# Patient Record
Sex: Male | Born: 1935
Health system: Southern US, Community
[De-identification: ages and names within clinical notes are randomized; demographics above are authoritative.]

## PROBLEM LIST (undated history)

## (undated) DIAGNOSIS — C801 Malignant (primary) neoplasm, unspecified: Secondary | ICD-10-CM

## (undated) DIAGNOSIS — G8929 Other chronic pain: Secondary | ICD-10-CM

## (undated) DIAGNOSIS — R0602 Shortness of breath: Secondary | ICD-10-CM

## (undated) DIAGNOSIS — G709 Myoneural disorder, unspecified: Secondary | ICD-10-CM

## (undated) DIAGNOSIS — G253 Myoclonus: Secondary | ICD-10-CM

## (undated) DIAGNOSIS — I517 Cardiomegaly: Secondary | ICD-10-CM

## (undated) DIAGNOSIS — G4730 Sleep apnea, unspecified: Secondary | ICD-10-CM

## (undated) DIAGNOSIS — N189 Chronic kidney disease, unspecified: Secondary | ICD-10-CM

## (undated) DIAGNOSIS — N429 Disorder of prostate, unspecified: Secondary | ICD-10-CM

## (undated) DIAGNOSIS — G2581 Restless legs syndrome: Secondary | ICD-10-CM

## (undated) DIAGNOSIS — G4710 Hypersomnia, unspecified: Secondary | ICD-10-CM

## (undated) DIAGNOSIS — M79606 Pain in leg, unspecified: Secondary | ICD-10-CM

## (undated) DIAGNOSIS — R351 Nocturia: Secondary | ICD-10-CM

## (undated) DIAGNOSIS — R0902 Hypoxemia: Secondary | ICD-10-CM

## (undated) DIAGNOSIS — G478 Other sleep disorders: Secondary | ICD-10-CM

## (undated) DIAGNOSIS — I639 Cerebral infarction, unspecified: Secondary | ICD-10-CM

## (undated) DIAGNOSIS — R269 Unspecified abnormalities of gait and mobility: Secondary | ICD-10-CM

## (undated) DIAGNOSIS — M1990 Unspecified osteoarthritis, unspecified site: Secondary | ICD-10-CM

## (undated) DIAGNOSIS — I10 Essential (primary) hypertension: Secondary | ICD-10-CM

## (undated) HISTORY — DX: Unspecified abnormalities of gait and mobility: R26.9

## (undated) HISTORY — DX: Other chronic pain: G89.29

## (undated) HISTORY — PX: HERNIA REPAIR: SHX51

## (undated) HISTORY — DX: Myoclonus: G25.3

## (undated) HISTORY — DX: Restless legs syndrome: G25.81

## (undated) HISTORY — DX: Pain in leg, unspecified: M79.606

## (undated) HISTORY — DX: Nocturia: R35.1

## (undated) HISTORY — DX: Hypersomnia, unspecified: G47.10

## (undated) HISTORY — PX: CATARACT EXTRACTION: SUR2

## (undated) HISTORY — DX: Hypoxemia: R09.02

## (undated) HISTORY — DX: Other sleep disorders: G47.8

---

## 2001-07-01 HISTORY — PX: BACK SURGERY: SHX140

## 2003-01-06 ENCOUNTER — Encounter: Payer: Self-pay | Admitting: Neurosurgery

## 2003-01-06 ENCOUNTER — Inpatient Hospital Stay (HOSPITAL_COMMUNITY): Admission: RE | Admit: 2003-01-06 | Discharge: 2003-01-07 | Payer: Self-pay | Admitting: Neurosurgery

## 2012-03-12 ENCOUNTER — Other Ambulatory Visit: Payer: Self-pay | Admitting: Urology

## 2012-03-16 ENCOUNTER — Encounter (HOSPITAL_COMMUNITY): Payer: Self-pay | Admitting: Pharmacy Technician

## 2012-03-18 ENCOUNTER — Encounter (HOSPITAL_COMMUNITY)
Admission: RE | Admit: 2012-03-18 | Discharge: 2012-03-18 | Disposition: A | Discharge status: Home / Self Care | Payer: Medicare Other | Source: Ambulatory Visit | Attending: Urology | Admitting: Urology

## 2012-03-18 ENCOUNTER — Encounter (HOSPITAL_COMMUNITY): Payer: Self-pay

## 2012-03-18 HISTORY — DX: Unspecified osteoarthritis, unspecified site: M19.90

## 2012-03-18 HISTORY — DX: Essential (primary) hypertension: I10

## 2012-03-18 HISTORY — DX: Shortness of breath: R06.02

## 2012-03-18 HISTORY — DX: Chronic kidney disease, unspecified: N18.9

## 2012-03-18 HISTORY — DX: Myoneural disorder, unspecified: G70.9

## 2012-03-18 LAB — BASIC METABOLIC PANEL
BUN: 18 mg/dL (ref 6–23)
Calcium: 9 mg/dL (ref 8.4–10.5)
Creatinine, Ser: 0.91 mg/dL (ref 0.50–1.35)
GFR calc Af Amer: 88 mL/min — ABNORMAL LOW (ref >90)

## 2012-03-18 LAB — CBC
HCT: 38.1 % — ABNORMAL LOW (ref 39.0–52.0)
MCH: 32.9 pg (ref 26.0–34.0)
MCHC: 33.3 g/dL (ref 30.0–36.0)
MCV: 98.7 fL (ref 78.0–100.0)
Platelets: 254 10*3/uL (ref 150–400)
RDW: 12.7 % (ref 11.5–15.5)

## 2012-03-18 LAB — SURGICAL PCR SCREEN: MRSA, PCR: NEGATIVE

## 2012-03-18 NOTE — Patient Instructions (Signed)
[name]  03/18/2012   Your procedure is scheduled on: 03/24/12   Report to [facility] at  AM.  Call this number if you have problems the morning of surgery: [phone]   Remember:   Do not eat food:After Midnight.  May have clear liquids:until Midnight .  Clear liquids include soda, tea, black coffee, apple or grape juice, broth.  Take these medicines the morning of surgery with A SIP OF WATER:    Do not wear jewelry, make-up or nail polish.  Do not wear lotions, powders, or perfumes.   . Men may shave face and neck.  Do not bring valuables to the hospital.  Contacts, dentures or bridgework may not be worn into surgery.  Leave suitcase in the car. After surgery it may be brought to your room.  For patients admitted to the hospital, checkout time is 11:00 AM the day of discharge.   Patients discharged the day of surgery will not be allowed to drive home.  Name and phone number of your driver:  Special Instructions: SEE CHG INSTRUCTION SHEET    Please read over the following fact sheets that you were given: MRSA Information, coughing and deep breathing exercises, leg exercises

## 2012-03-18 NOTE — Progress Notes (Signed)
03/18/12 1524  OBSTRUCTIVE SLEEP APNEA  Have you ever been diagnosed with sleep apnea through a sleep study? No  Do you snore loudly (loud enough to be heard through closed doors)?  0  Do you often feel tired, fatigued, or sleepy during the daytime? 1  Has anyone observed you stop breathing during your sleep? 0  Do you have, or are you being treated for high blood pressure? 1  BMI more than 35 kg/m2? 0  Age over 76 years old? 1  Neck circumference greater than 40 cm/18 inches? 0  Gender: 1  Obstructive Sleep Apnea Score 4   Score 4 or greater  Updated health history

## 2012-03-20 NOTE — Progress Notes (Signed)
Dr Acey Lav aware of confirmed EKG on 03/18/12 along with EKG done 4/11 on chart.  No new orders given.

## 2012-03-23 NOTE — H&P (Signed)
History of Present Illness            F/u LUTS.   1 - urgency - referred by Dr. Vickey Huger Jul 2013. Bothersome daytime urgency and barely makes it to the bathroom. He has no frequency. He voids with a good stream. He has no dysuria or hematuria. He feels his bladder is empty. Nocturia 3-7. He doesn't void much urine with each trip. He is on tamsulosin for a couple of years. He had a sleep study and did not have sleep apnea. He has occasional LE edema. He saw Dr. Earlene Plater with virtually the same history in 2010 and was started on tamsulosin. He was given samples of Vesicare, but doesnt recall if he took it.    He as significant issues with RLS and has been on Mirapax and Requip. He is was given imipramine and took it for a month but says he did not refill. He noticed no difference in LUTS.  Interval Hx He returns to review his UDS and for cystoscopy.   Aug 2013 UDS - small capacity unstable detrusor. No leakage but voided off an unstable contraction. EMG up slightly due to straining. Obstructed flow pattern, PVR 40 ml.  Most bothersome symptoms are urgency, nocturia and weak stream.   Past Medical History Problems  1. History of  Anxiety (Symptom) 300.00 2. History of  Arthritis V13.4 3. History of  Depression 311 4. History of  Gastric Ulcer 531.90 5. History of  Hypertension 401.9 6. History of  Hypertension 401.9  Surgical History Problems  1. History of  Back Surgery 2. History of  Inguinal Hernia Repair  Current Meds 1. Benicar HCT 40-12.5 MG Oral Tablet; Therapy: 07Jul2012 to 2. Gabapentin 600 MG Oral Tablet; Therapy: 05Feb2013 to 3. Imipramine HCl 10 MG Oral Tablet; Therapy: 17Jun2013 to 4. Requip XL 4 MG Oral Tablet Extended Release 24 Hour; Therapy: 08May2013 to 5. Tamsulosin HCl 0.4 MG Oral Capsule; Therapy: 08Apr2013 to  Allergies Medication  1. No Known Drug Allergies  Family History Problems  1. Paternal history of  Congestive Heart Failure 2. Family history of   Death In The Family Mother 3. Family history of  Family Health Status - Father's Age Died from CHF 4. Family history of  Family Health Status - Mother's Age Died from 47 from heart failure 5. Family history of  Family Health Status Number Of Children 5 children 6. Maternal history of  Heart Disease V17.49 7. Paternal history of  Hypertension V17.49 8. Paternal history of  Renal Disorder  Social History Problems  1. Alcohol Use 3 per week 2. Marital History - Currently Married 3. Never A Smoker Denied  4. History of  Caffeine Use 5. History of  Former Smoker  Review of Systems Constitutional, skin, eye, otolaryngeal, hematologic/lymphatic, cardiovascular, pulmonary, endocrine, musculoskeletal, gastrointestinal, neurological and psychiatric system(s) were reviewed and pertinent findings if present are noted.    Physical Exam Constitutional: Well nourished and well developed . No acute distress.  Pulmonary: No respiratory distress and normal respiratory rhythm and effort.  Cardiovascular: Heart rate and rhythm are normal . No peripheral edema.  Skin: Normal skin turgor, no visible rash and no visible skin lesions.  Neuro/Psych:. Mood and affect are appropriate.    Results/Data Urine [Data Includes: Last 1 Day]   09Sep2013  COLOR YELLOW   APPEARANCE CLEAR   SPECIFIC GRAVITY 1.025   pH 6.0   GLUCOSE NEG mg/dL  BILIRUBIN NEG   KETONE NEG mg/dL  BLOOD NEG  PROTEIN NEG mg/dL  UROBILINOGEN 0.2 mg/dL  NITRITE NEG   LEUKOCYTE ESTERASE NEG    Procedure  Procedure: Cystoscopy   Informed Consent: Risks, benefits, and potential adverse events were discussed and informed consent was obtained from the patient.  Prep: The patient was prepped with betadine.  Procedure Note:  Urethral meatus:. No abnormalities.  Anterior urethra: No abnormalities.  Prostatic urethra: No abnormalities . The lateral prostatic lobes were enlarged.  Bladder: Visulization was clear. The ureteral  orifices were in the normal anatomic position bilaterally and had clear efflux of urine. A systematic survey of the bladder demonstrated no bladder tumors or stones. The mucosa was smooth without abnormalities. Examination of the bladder demonstrated mild trabeculation. The patient tolerated the procedure well.  Complications: None.    Assessment Assessed  1. Benign Prostatic Hypertrophy 600.00 2. Feelings Of Urinary Urgency 788.63      Unstable bladder with obs flow pattern and BPH with BOO on cysto   Plan Bladder Neck Obstruction (596.0), Benign Prostatic Hypertrophy (600.00), Feelings Of Urinary Urgency (788.63)  1. Follow-up Schedule Surgery Office  Follow-up  Requested for: 09Sep2013 Health Maintenance (V70.0)  2. UA With REFLEX  Done: 09Sep2013 10:42AM  Discussion/Summary     Using the Understanding prostate problems booklet we discussed the treatment of BPH/BOO and lower urinary tract symptoms including the nature risks and benefits of behavioral therapy, physical therapy, alpha blockers, 5 alpha reductase inhibitors, anticholinergics, office or OR based procedures. All questions answered. He wouldlike to proceed with Greenlight PVP. He really wants to be off medicine and proceed with an efficient, durable procedure. We discussed risks of bleeding, infection, retention, incontinence, strictures, fluid overload, thrombotic events, anesthetic complications, worsening/de novo urgency and frequency, and sexual dysfunction (impotence/retrograde ejaculation) among others. We talked about the 85 to 90 percent success rate with flow symptoms, but overactive bladder symptoms could settle down in 65 percent of the cases, persist in a third and worsen in 2 to 3 percent. We discussed treatment goals may not be reached and/or may require multiple procedures. We discussed the likelihood of achieving the goals of the procedure and potential problems that might occur during the procedure or  recuperation.     Signatures Electronically signed by : Jerilee Field, M.D.; Mar 09 2012 12:19PM

## 2012-03-24 ENCOUNTER — Encounter (HOSPITAL_COMMUNITY): Payer: Self-pay | Admitting: *Deleted

## 2012-03-24 ENCOUNTER — Encounter (HOSPITAL_COMMUNITY)
Admission: RE | Disposition: A | Discharge status: Home / Self Care | Payer: Self-pay | Source: Ambulatory Visit | Attending: Urology

## 2012-03-24 ENCOUNTER — Encounter (HOSPITAL_COMMUNITY): Payer: Self-pay | Admitting: Anesthesiology

## 2012-03-24 ENCOUNTER — Ambulatory Visit (HOSPITAL_COMMUNITY)
Admission: RE | Admit: 2012-03-24 | Discharge: 2012-03-25 | Disposition: A | Discharge status: Home / Self Care | Payer: Medicare Other | Source: Ambulatory Visit | Attending: Urology | Admitting: Urology

## 2012-03-24 ENCOUNTER — Ambulatory Visit (HOSPITAL_COMMUNITY): Payer: Medicare Other | Admitting: Anesthesiology

## 2012-03-24 DIAGNOSIS — I10 Essential (primary) hypertension: Secondary | ICD-10-CM | POA: Insufficient documentation

## 2012-03-24 DIAGNOSIS — R39198 Other difficulties with micturition: Secondary | ICD-10-CM | POA: Insufficient documentation

## 2012-03-24 DIAGNOSIS — N401 Enlarged prostate with lower urinary tract symptoms: Principal | ICD-10-CM | POA: Insufficient documentation

## 2012-03-24 DIAGNOSIS — N320 Bladder-neck obstruction: Secondary | ICD-10-CM | POA: Insufficient documentation

## 2012-03-24 DIAGNOSIS — N138 Other obstructive and reflux uropathy: Principal | ICD-10-CM | POA: Insufficient documentation

## 2012-03-24 DIAGNOSIS — Z79899 Other long term (current) drug therapy: Secondary | ICD-10-CM | POA: Insufficient documentation

## 2012-03-24 DIAGNOSIS — R3915 Urgency of urination: Secondary | ICD-10-CM | POA: Insufficient documentation

## 2012-03-24 DIAGNOSIS — Z01812 Encounter for preprocedural laboratory examination: Secondary | ICD-10-CM | POA: Insufficient documentation

## 2012-03-24 HISTORY — PX: TRANSURETHRAL RESECTION OF PROSTATE: SHX73

## 2012-03-24 SURGERY — TURP (TRANSURETHRAL RESECTION OF PROSTATE)
Anesthesia: General | Wound class: Clean Contaminated

## 2012-03-24 MED ORDER — ROPINIROLE HCL ER 4 MG PO TB24
4.0000 mg | ORAL_TABLET | Freq: Every day | ORAL | Status: DC
  Filled 2012-03-24: qty 1

## 2012-03-24 MED ORDER — LACTATED RINGERS IV SOLN
INTRAVENOUS | Status: DC
  Administered 2012-03-24: 1000 mL via INTRAVENOUS
  Administered 2012-03-24: 11:00:00 via INTRAVENOUS

## 2012-03-24 MED ORDER — ACETAMINOPHEN 325 MG PO TABS: 650.0000 mg | ORAL_TABLET | ORAL | Status: DC | PRN

## 2012-03-24 MED ORDER — HYDROCHLOROTHIAZIDE 12.5 MG PO CAPS
12.5000 mg | ORAL_CAPSULE | Freq: Every day | ORAL | Status: DC
  Administered 2012-03-24 – 2012-03-25 (×2): 12.5 mg via ORAL
  Filled 2012-03-24 (×2): qty 1

## 2012-03-24 MED ORDER — ZOLPIDEM TARTRATE 5 MG PO TABS
5.0000 mg | ORAL_TABLET | Freq: Every evening | ORAL | Status: DC | PRN
  Administered 2012-03-24: 5 mg via ORAL
  Filled 2012-03-24: qty 1

## 2012-03-24 MED ORDER — GLYCOPYRROLATE 0.2 MG/ML IJ SOLN
INTRAMUSCULAR | Status: DC | PRN
  Administered 2012-03-24: 0.6 mg via INTRAVENOUS

## 2012-03-24 MED ORDER — CIPROFLOXACIN HCL 500 MG PO TABS
500.0000 mg | ORAL_TABLET | Freq: Two times a day (BID) | ORAL | Status: DC
  Administered 2012-03-24 – 2012-03-25 (×2): 500 mg via ORAL
  Filled 2012-03-24 (×5): qty 1

## 2012-03-24 MED ORDER — ACETAMINOPHEN 10 MG/ML IV SOLN
INTRAVENOUS | Status: DC | PRN
  Administered 2012-03-24: 1000 mg via INTRAVENOUS

## 2012-03-24 MED ORDER — IRBESARTAN 300 MG PO TABS
300.0000 mg | ORAL_TABLET | Freq: Every day | ORAL | Status: DC
  Administered 2012-03-24 – 2012-03-25 (×2): 300 mg via ORAL
  Filled 2012-03-24 (×2): qty 1

## 2012-03-24 MED ORDER — MIDAZOLAM HCL 5 MG/5ML IJ SOLN
INTRAMUSCULAR | Status: DC | PRN
  Administered 2012-03-24 (×2): 1 mg via INTRAVENOUS

## 2012-03-24 MED ORDER — PROMETHAZINE HCL 25 MG/ML IJ SOLN: 6.2500 mg | INTRAMUSCULAR | Status: DC | PRN

## 2012-03-24 MED ORDER — CEPHALEXIN 500 MG PO CAPS: 500.0000 mg | ORAL_CAPSULE | Freq: Two times a day (BID) | ORAL | Status: DC

## 2012-03-24 MED ORDER — DOCUSATE SODIUM 100 MG PO CAPS: 100.0000 mg | ORAL_CAPSULE | Freq: Two times a day (BID) | ORAL | Status: DC

## 2012-03-24 MED ORDER — ROCURONIUM BROMIDE 100 MG/10ML IV SOLN
INTRAVENOUS | Status: DC | PRN
  Administered 2012-03-24: 10 mg via INTRAVENOUS
  Administered 2012-03-24: 40 mg via INTRAVENOUS

## 2012-03-24 MED ORDER — CEFAZOLIN SODIUM-DEXTROSE 2-3 GM-% IV SOLR
2.0000 g | INTRAVENOUS | Status: AC
  Administered 2012-03-24: 2 g via INTRAVENOUS

## 2012-03-24 MED ORDER — MENTHOL 3 MG MT LOZG
1.0000 | LOZENGE | OROMUCOSAL | Status: DC | PRN
  Filled 2012-03-24: qty 9

## 2012-03-24 MED ORDER — OXYCODONE-ACETAMINOPHEN 5-325 MG PO TABS
1.0000 | ORAL_TABLET | ORAL | Status: DC | PRN
Start: 2012-03-24 — End: 2012-03-27

## 2012-03-24 MED ORDER — BISACODYL 10 MG RE SUPP: 10.0000 mg | Freq: Every day | RECTAL | Status: DC | PRN

## 2012-03-24 MED ORDER — LIDOCAINE HCL 2 % EX GEL
CUTANEOUS | Status: AC
  Filled 2012-03-24: qty 10

## 2012-03-24 MED ORDER — BELLADONNA ALKALOIDS-OPIUM 16.2-60 MG RE SUPP: 1.0000 | Freq: Four times a day (QID) | RECTAL | Status: DC | PRN

## 2012-03-24 MED ORDER — OLMESARTAN MEDOXOMIL-HCTZ 40-12.5 MG PO TABS: 1.0000 | ORAL_TABLET | Freq: Every morning | ORAL | Status: DC

## 2012-03-24 MED ORDER — BACITRACIN-NEOMYCIN-POLYMYXIN 400-5-5000 EX OINT: 1.0000 "application " | TOPICAL_OINTMENT | Freq: Three times a day (TID) | CUTANEOUS | Status: DC | PRN

## 2012-03-24 MED ORDER — TAMSULOSIN HCL 0.4 MG PO CAPS
0.4000 mg | ORAL_CAPSULE | Freq: Every day | ORAL | Status: DC
  Administered 2012-03-25: 0.4 mg via ORAL
  Filled 2012-03-24 (×2): qty 1

## 2012-03-24 MED ORDER — PHENOL 1.4 % MT LIQD
1.0000 | OROMUCOSAL | Status: DC | PRN
  Filled 2012-03-24: qty 177

## 2012-03-24 MED ORDER — CEFAZOLIN SODIUM-DEXTROSE 2-3 GM-% IV SOLR
INTRAVENOUS | Status: AC
  Filled 2012-03-24: qty 50

## 2012-03-24 MED ORDER — OXYCODONE HCL 5 MG PO TABS
5.0000 mg | ORAL_TABLET | ORAL | Status: DC | PRN
  Administered 2012-03-24 – 2012-03-25 (×3): 5 mg via ORAL
  Filled 2012-03-24 (×3): qty 1

## 2012-03-24 MED ORDER — BELLADONNA ALKALOIDS-OPIUM 16.2-60 MG RE SUPP
RECTAL | Status: AC
Start: 2012-03-24 — End: 2012-03-24
  Filled 2012-03-24: qty 1

## 2012-03-24 MED ORDER — EPHEDRINE SULFATE 50 MG/ML IJ SOLN
INTRAMUSCULAR | Status: DC | PRN
  Administered 2012-03-24 (×3): 2.5 mg via INTRAVENOUS

## 2012-03-24 MED ORDER — ACETAMINOPHEN 10 MG/ML IV SOLN
INTRAVENOUS | Status: AC
  Filled 2012-03-24: qty 100

## 2012-03-24 MED ORDER — LIDOCAINE HCL (CARDIAC) 20 MG/ML IV SOLN
INTRAVENOUS | Status: DC | PRN
  Administered 2012-03-24: 80 mg via INTRAVENOUS

## 2012-03-24 MED ORDER — ONDANSETRON HCL 4 MG/2ML IJ SOLN: 4.0000 mg | INTRAMUSCULAR | Status: DC | PRN

## 2012-03-24 MED ORDER — HYOSCYAMINE SULFATE 0.125 MG SL SUBL
0.1250 mg | SUBLINGUAL_TABLET | SUBLINGUAL | Status: DC | PRN
  Filled 2012-03-24: qty 1

## 2012-03-24 MED ORDER — PROPOFOL 10 MG/ML IV BOLUS
INTRAVENOUS | Status: DC | PRN
  Administered 2012-03-24: 160 mg via INTRAVENOUS

## 2012-03-24 MED ORDER — HYDROMORPHONE HCL PF 1 MG/ML IJ SOLN: 0.2500 mg | INTRAMUSCULAR | Status: DC | PRN

## 2012-03-24 MED ORDER — NEOSTIGMINE METHYLSULFATE 1 MG/ML IJ SOLN
INTRAMUSCULAR | Status: DC | PRN
  Administered 2012-03-24: 4 mg via INTRAVENOUS

## 2012-03-24 MED ORDER — LACTATED RINGERS IV SOLN: INTRAVENOUS | Status: DC

## 2012-03-24 MED ORDER — FENTANYL CITRATE 0.05 MG/ML IJ SOLN
INTRAMUSCULAR | Status: DC | PRN
  Administered 2012-03-24 (×2): 50 ug via INTRAVENOUS

## 2012-03-24 MED ORDER — GABAPENTIN 600 MG PO TABS
600.0000 mg | ORAL_TABLET | Freq: Every day | ORAL | Status: DC
  Administered 2012-03-24: 600 mg via ORAL
  Filled 2012-03-24 (×2): qty 1

## 2012-03-24 MED ORDER — SODIUM CHLORIDE 0.9 % IR SOLN
Status: DC | PRN
  Administered 2012-03-24: 34000 mL

## 2012-03-24 MED ORDER — BELLADONNA ALKALOIDS-OPIUM 16.2-60 MG RE SUPP
RECTAL | Status: DC | PRN
  Administered 2012-03-24: 1 via RECTAL

## 2012-03-24 MED ORDER — ONDANSETRON HCL 4 MG/2ML IJ SOLN
INTRAMUSCULAR | Status: DC | PRN
  Administered 2012-03-24: 4 mg via INTRAVENOUS

## 2012-03-24 SURGICAL SUPPLY — 29 items
BAG URINE DRAINAGE (UROLOGICAL SUPPLIES) ×4 IMPLANT
BAG URO CATCHER STRL LF (DRAPE) ×2 IMPLANT
BLADE SURG 15 STRL LF DISP TIS (BLADE) IMPLANT
BLADE SURG 15 STRL SS (BLADE)
CATH FOLEY 3WAY 30CC 20FR (CATHETERS) ×2 IMPLANT
CATH FOLEY 3WAY 30CC 22FR (CATHETERS) IMPLANT
CATH URET 5FR 28IN OPEN ENDED (CATHETERS) IMPLANT
CLOTH BEACON ORANGE TIMEOUT ST (SAFETY) ×2 IMPLANT
CONT SPEC 4OZ CLIKSEAL STRL BL (MISCELLANEOUS) ×2 IMPLANT
DRAPE CAMERA CLOSED 9X96 (DRAPES) ×2 IMPLANT
ELECT LOOP MED HF 24F 12D CBL (CLIP) ×2 IMPLANT
ELECT REM PT RETURN 9FT ADLT (ELECTROSURGICAL)
ELECTRODE REM PT RTRN 9FT ADLT (ELECTROSURGICAL) IMPLANT
EVACUATOR MICROVAS BLADDER (UROLOGICAL SUPPLIES) IMPLANT
GLOVE BIOGEL M STRL SZ7.5 (GLOVE) ×2 IMPLANT
GOWN PREVENTION PLUS XLARGE (GOWN DISPOSABLE) ×2 IMPLANT
GOWN STRL NON-REIN LRG LVL3 (GOWN DISPOSABLE) ×2 IMPLANT
GOWN STRL REIN XL XLG (GOWN DISPOSABLE) ×2 IMPLANT
HOLDER FOLEY CATH W/STRAP (MISCELLANEOUS) IMPLANT
KIT ASPIRATION TUBING (SET/KITS/TRAYS/PACK) IMPLANT
LASER FIBER /GREENLIGHT LASER (Laser) ×2 IMPLANT
LASER GREENLIGHT RENTAL P/PROC (Laser) ×2 IMPLANT
LOOPS RESECTOSCOPE DISP (ELECTROSURGICAL) IMPLANT
MANIFOLD NEPTUNE II (INSTRUMENTS) ×2 IMPLANT
PACK CYSTO (CUSTOM PROCEDURE TRAY) ×2 IMPLANT
SUT ETHILON 3 0 PS 1 (SUTURE) IMPLANT
SYR 30ML LL (SYRINGE) IMPLANT
SYRINGE IRR TOOMEY STRL 70CC (SYRINGE) ×2 IMPLANT
TUBING CONNECTING 10 (TUBING) ×2 IMPLANT

## 2012-03-24 NOTE — Interval H&P Note (Signed)
History and Physical Interval Note:  03/24/2012 9:44 AM  Brandon Mason  has presented today for surgery, with the diagnosis of Benign Prostatic Hypertrophy with Bladder Outlet Obstruction  The various methods of treatment have been discussed with the patient and family. After consideration of risks, benefits and other options for treatment, the patient has consented to  Procedure(s) (LRB) with comments: TRANSURETHRAL RESECTION OF THE PROSTATE (TURP) (N/A) - Greenlight PVP laser of Prostate HOLMIUM LASER APPLICATION (N/A) -    as a surgical intervention .  The patient's history has been reviewed, patient examined, no change in status, stable for surgery.  I have reviewed the patient's chart and labs. We discussed again the improvement in flow symptoms we expect and that often the frequency, urgency and nocturia will improve but not in all cases. Questions were answered to the patient's satisfaction.   He elects to proceed.    [name]

## 2012-03-24 NOTE — Anesthesia Preprocedure Evaluation (Signed)
Anesthesia Evaluation  Patient identified by MRN, date of birth, ID band Patient awake    Reviewed: Allergy & Precautions, H&P , NPO status , Patient's Chart, lab work & pertinent test results  Airway Mallampati: II TM Distance: >3 FB Neck ROM: Full    Dental No notable dental hx.    Pulmonary shortness of breath,  breath sounds clear to auscultation  Pulmonary exam normal       Cardiovascular hypertension, Pt. on medications Rhythm:Regular Rate:Normal     Neuro/Psych  Neuromuscular disease negative psych ROS   GI/Hepatic negative GI ROS, Neg liver ROS,   Endo/Other  negative endocrine ROS  Renal/GU Renal disease  negative genitourinary   Musculoskeletal negative musculoskeletal ROS (+)   Abdominal   Peds negative pediatric ROS (+)  Hematology negative hematology ROS (+)   Anesthesia Other Findings   Reproductive/Obstetrics negative OB ROS                           Anesthesia Physical Anesthesia Plan  ASA: III  Anesthesia Plan: General   Post-op Pain Management:    Induction: Intravenous  Airway Management Planned: LMA  Additional Equipment:   Intra-op Plan:   Post-operative Plan: Extubation in OR  Informed Consent: I have reviewed the patients History and Physical, chart, labs and discussed the procedure including the risks, benefits and alternatives for the proposed anesthesia with the patient or authorized representative who has indicated his/her understanding and acceptance.   Dental advisory given  Plan Discussed with: CRNA  Anesthesia Plan Comments:         Anesthesia Quick Evaluation

## 2012-03-24 NOTE — Anesthesia Postprocedure Evaluation (Signed)
  Anesthesia Post-op Note  Patient: Brandon Mason  Procedure(s) Performed: Procedure(s) (LRB): TRANSURETHRAL RESECTION OF THE PROSTATE (TURP) (N/A) HOLMIUM LASER APPLICATION (N/A)  Patient Location: PACU  Anesthesia Type: General  Level of Consciousness: awake and alert   Airway and Oxygen Therapy: Patient Spontanous Breathing  Post-op Pain: mild  Post-op Assessment: Post-op Vital signs reviewed, Patient's Cardiovascular Status Stable, Respiratory Function Stable, Patent Airway and No signs of Nausea or vomiting  Post-op Vital Signs: stable  Complications: No apparent anesthesia complications

## 2012-03-24 NOTE — Op Note (Signed)
Preoperative diagnosis: 1 BPH 2 bladder outlet obstruction #3 lower urinary tract symptoms with a weak stream frequency and urgency Postoperative diagnosis: Same Procedure: Transurethral resection of prostate with bipolar instruments, Greenlight photo vaporization of lateral lobes of prostate  Surgeon: Mena Goes  Anesthesia: Gen.  Findings: Exam under anesthesia-the penis was normal without lesions. The testicles were descended without mass. On digital rectal exam the prostate was enlarged but was smooth without hard areas or nodules. Cystoscopy-the bladder was examined in its entirety with a 12 and 30 lens and noted to be free of urothelial carcinoma, stone or foreign body. The ureteral orifice these were normal. The ureteral orifice these were inspected before and following resection and noted to be normal without injury.  Description of procedure: After consent was obtained patient brought to the operating room. A timeout was performed to confirm the patient and procedure. After adequate anesthesia he is placed in lithotomy position. An exam under anesthesia was performed. He was then prepped and draped in the usual sterile fashion and a cystoscope was passed per urethra and the bladder examined. Patient had quite a large median lobe as well as large lateral lobes and elongated prostatic urethra. His prostate was possibly between 80-100 g. The laser sheath was placed with a visual obturator. The visual obturator was removed and the bridge place. The fiber was then passed through the sheath to be introduced into the bladder but would not pass through the distal and of the sheath. This possible year the sheath was traumatized. The fiber was then pushed through but would not pull back through. The tip of the laser fiber was damaged and the metal covering on the end of the laser fiber was stripped off. It was drained out of the bladder and collected and discarded. I did attempt to use resectoscope set to  then lasered the median lobe but with a 12 lens could not get precise visualization of the vaporization and laser exit point from the fiber. No photo vaporization was accomplished or attempted at this time. The backup laser sheath and set was still in sterile processing , therefore I introduced the bipolar loop to advance the case. Using the bipolar loop the median lobe was then resected starting in a line from the ureteral orifice these down through the bladder neck to the prostate capsule and down to the ureter. Having made to passes on either side of the median lobe the central part of the median lobe was then resected to connect the 2 channels. Adequate hemostasis was insured all chips were evacuated and again the ureteral orifice these were visualized and noted to be free of injury. The resectoscope was removed. The patient's prostate was large and quite friable therefore switch back to the laser is the equipment was now ready an attempt to laser the lateral lobes with better hemostasis. Starting from the bladder neck heading toward the apex I used to greenlight to vaporize the left lateral lobe followed by the right lateral lobe. The lateral lobes were quite large and there was quite a bit of apical tissue and I did not feel I could adequately visualize the stable condition. 2 vaporized with the laser. Therefore the laser was removed and the resectoscope was replaced. The lateral lobes were cleaned up and the apical tissue was resected with the loop. Again adequate hemostasis was insured in all chips were evacuated. The ureteral orifice these were inspected and noted to be normal. The scope was removed and a 20 [place] three-way Foley  was passed with catheter guide. The urine was draining light pink. Patient was then awakened taken to recovery room in stable condition.  Estimated blood loss: Minimal  Complications: None  Specimens: TURP chips  Drains: 20 French three-way Foley  Disposition: Patient  stable to PACU. He'll be admitted for overnight observation.

## 2012-03-24 NOTE — Progress Notes (Signed)
Patient ambulated in hallway >168feet. Patient tolerated well. Incentive spirometer also encouraged. Patient resting in chair at this time.  Will continue to monitor. Setzer, Don Broach

## 2012-03-24 NOTE — Transfer of Care (Signed)
Immediate Anesthesia Transfer of Care Note  Patient: Brandon Mason  Procedure(s) Performed: Procedure(s) (LRB) with comments: TRANSURETHRAL RESECTION OF THE PROSTATE (TURP) (N/A) - with gyrus ,Greenlight PVP laser of Prostate HOLMIUM LASER APPLICATION (N/A) -     Patient Location: PACU  Anesthesia Type: General  Level of Consciousness: awake, alert  and oriented  Airway & Oxygen Therapy: Patient Spontanous Breathing and Patient connected to face mask oxygen  Post-op Assessment: Report given to PACU RN and Post -op Vital signs reviewed and stable  Post vital signs: Reviewed and stable  Complications: No apparent anesthesia complications

## 2012-03-25 ENCOUNTER — Encounter (HOSPITAL_COMMUNITY): Payer: Self-pay | Admitting: Urology

## 2012-03-25 NOTE — Progress Notes (Signed)
Prescriptions sent to [facility] in [place] per Dr. Mena Goes. Setzer, Don Broach

## 2012-03-25 NOTE — Progress Notes (Signed)
Patient ID: Brandon Mason, male   DOB: [birth year], 78 y.o.   MRN: [id number]  Stable and without complaint. Foley removed about an hour ago.   Imp - s/p TURP bipolar and Greenlight PVP -  Plan - Void trial Home later +/- foley.

## 2012-03-25 NOTE — Discharge Summary (Signed)
Physician Discharge Summary  Patient ID: Jeffry Riss MRN: [id number] DOB/AGE: Sep 23, 1925 78 y.o.  Admit date: 03/24/2012 Discharge date: 03/25/2012  Admission Diagnoses: BPH, LUTS, BOO  Discharge Diagnoses:  same  Discharged Condition: good  Hospital Course: Patient was admitted for observation following transurethral resection of the prostate with combined Green light photo vaporization of the prostate.He had a very large prostate. His urine had light hematuria postop. CBI was turned off this morning and his Foley was removed 2 hours later. He has voided several times without difficulty. He has some red urine but there are no clots. He is voiding without difficulty. He was instructed to continue adequate fluid intake. He remains stable may discharge to home.  Consults: none  Significant Diagnostic Studies: none   Treatments: surgery: Transurethral resection of the prostate, green light photo vaporization prostate  Discharge Exam: Blood pressure 129/59, pulse 96, temperature 98.3 F (36.8 C), temperature source Oral, resp. rate 18, height 5\' 9"  (1.753 m), weight 74.889 kg (165 lb 1.6 oz), SpO2 96.00%. No acute distress Sitting in bed eating breakfast Abdomen soft and nontender Extremities no edema  Disposition: To home/self-care     Medication List     As of 03/25/2012 12:04 PM    TAKE these medications         CALCIUM 600 + D PO   Take 1 tablet by mouth daily.      cephALEXin 500 MG capsule   Commonly known as: KEFLEX   Take 1 capsule (500 mg total) by mouth 2 (two) times daily.      docusate sodium 100 MG capsule   Commonly known as: COLACE   Take 1 capsule (100 mg total) by mouth 2 (two) times daily.      fish oil-omega-3 fatty acids 1000 MG capsule   Take 1 g by mouth daily.      gabapentin 600 MG tablet   Commonly known as: NEURONTIN   Take 600 mg by mouth at bedtime.      multivitamin with minerals Tabs   Take 1 tablet by mouth daily.     olmesartan-hydrochlorothiazide 40-12.5 MG per tablet   Commonly known as: BENICAR HCT   Take 1 tablet by mouth every morning.      oxyCODONE-acetaminophen 5-325 MG per tablet   Commonly known as: PERCOCET/ROXICET   Take 1 tablet by mouth every 4 (four) hours as needed for pain.      rOPINIRole 4 MG 24 hr tablet   Commonly known as: REQUIP XL   Take 4 mg by mouth at bedtime.      Tamsulosin HCl 0.4 MG Caps   Commonly known as: FLOMAX   Take 0.4 mg by mouth daily after breakfast.           Follow-up Information    In 1 week to follow up.   Contact information:   [place] Kasper         Signed: [name] 03/25/2012, 12:04 PM

## 2012-03-27 ENCOUNTER — Observation Stay (HOSPITAL_COMMUNITY)
Admission: EM | Admit: 2012-03-27 | Discharge: 2012-03-28 | Disposition: A | Discharge status: Home / Self Care | Payer: Medicare Other | Attending: Internal Medicine | Admitting: Internal Medicine

## 2012-03-27 ENCOUNTER — Encounter (HOSPITAL_COMMUNITY): Payer: Self-pay | Admitting: *Deleted

## 2012-03-27 DIAGNOSIS — N139 Obstructive and reflux uropathy, unspecified: Secondary | ICD-10-CM

## 2012-03-27 DIAGNOSIS — N179 Acute kidney failure, unspecified: Principal | ICD-10-CM | POA: Diagnosis present

## 2012-03-27 DIAGNOSIS — Z79899 Other long term (current) drug therapy: Secondary | ICD-10-CM | POA: Insufficient documentation

## 2012-03-27 DIAGNOSIS — R339 Retention of urine, unspecified: Secondary | ICD-10-CM | POA: Insufficient documentation

## 2012-03-27 DIAGNOSIS — I10 Essential (primary) hypertension: Secondary | ICD-10-CM | POA: Insufficient documentation

## 2012-03-27 LAB — BASIC METABOLIC PANEL
Chloride: 99 mEq/L (ref 96–112)
GFR calc Af Amer: 26 mL/min — ABNORMAL LOW (ref >90)
Potassium: 4.1 mEq/L (ref 3.5–5.1)
Sodium: 136 mEq/L (ref 135–145)

## 2012-03-27 LAB — CBC
HCT: 35.7 % — ABNORMAL LOW (ref 39.0–52.0)
Platelets: 182 10*3/uL (ref 150–400)
RDW: 12.7 % (ref 11.5–15.5)
WBC: 10.4 10*3/uL (ref 4.0–10.5)

## 2012-03-27 LAB — URINALYSIS, MICROSCOPIC ONLY
Ketones, ur: NEGATIVE mg/dL
Nitrite: NEGATIVE
Specific Gravity, Urine: 1.007 (ref 1.005–1.030)
pH: 5.5 (ref 5.0–8.0)

## 2012-03-27 MED ORDER — ONDANSETRON HCL 4 MG/2ML IJ SOLN: 4.0000 mg | Freq: Four times a day (QID) | INTRAMUSCULAR | Status: DC | PRN

## 2012-03-27 MED ORDER — ADULT MULTIVITAMIN W/MINERALS CH
1.0000 | ORAL_TABLET | Freq: Every day | ORAL | Status: DC
  Administered 2012-03-27 – 2012-03-28 (×2): 1 via ORAL
  Filled 2012-03-27 (×2): qty 1

## 2012-03-27 MED ORDER — GABAPENTIN 600 MG PO TABS
600.0000 mg | ORAL_TABLET | Freq: Every day | ORAL | Status: DC
  Filled 2012-03-27: qty 1

## 2012-03-27 MED ORDER — POLYETHYLENE GLYCOL 3350 17 G PO PACK
17.0000 g | PACK | Freq: Every day | ORAL | Status: DC | PRN
  Administered 2012-03-27: 17 g via ORAL
  Filled 2012-03-27: qty 1

## 2012-03-27 MED ORDER — ONDANSETRON HCL 4 MG PO TABS: 4.0000 mg | ORAL_TABLET | Freq: Four times a day (QID) | ORAL | Status: DC | PRN

## 2012-03-27 MED ORDER — DOCUSATE SODIUM 100 MG PO CAPS
100.0000 mg | ORAL_CAPSULE | Freq: Two times a day (BID) | ORAL | Status: DC
  Administered 2012-03-27 – 2012-03-28 (×2): 100 mg via ORAL
  Filled 2012-03-27 (×3): qty 1

## 2012-03-27 MED ORDER — OXYCODONE-ACETAMINOPHEN 5-325 MG PO TABS: 1.0000 | ORAL_TABLET | ORAL | Status: DC | PRN

## 2012-03-27 MED ORDER — SODIUM CHLORIDE 0.9 % IV SOLN
INTRAVENOUS | Status: DC
  Administered 2012-03-27 – 2012-03-28 (×2): via INTRAVENOUS

## 2012-03-27 MED ORDER — ROPINIROLE HCL ER 4 MG PO TB24: 4.0000 mg | ORAL_TABLET | Freq: Every day | ORAL | Status: DC

## 2012-03-27 MED ORDER — ROPINIROLE HCL 1 MG PO TABS
1.0000 mg | ORAL_TABLET | Freq: Three times a day (TID) | ORAL | Status: DC
  Administered 2012-03-27 – 2012-03-28 (×3): 1 mg via ORAL
  Filled 2012-03-27 (×5): qty 1

## 2012-03-27 MED ORDER — TAMSULOSIN HCL 0.4 MG PO CAPS
0.4000 mg | ORAL_CAPSULE | Freq: Two times a day (BID) | ORAL | Status: DC
  Administered 2012-03-27 – 2012-03-28 (×2): 0.4 mg via ORAL
  Filled 2012-03-27 (×3): qty 1

## 2012-03-27 MED ORDER — GABAPENTIN 300 MG PO CAPS
600.0000 mg | ORAL_CAPSULE | Freq: Every day | ORAL | Status: DC
  Administered 2012-03-27: 600 mg via ORAL
  Filled 2012-03-27 (×2): qty 2

## 2012-03-27 MED ORDER — MAGNESIUM HYDROXIDE 400 MG/5ML PO SUSP: 5.0000 mL | Freq: Every day | ORAL | Status: DC | PRN

## 2012-03-27 NOTE — ED Provider Notes (Signed)
History     CSN: [id number]  Arrival date & time 03/27/12  1224   First MD Initiated Contact with Patient 03/27/12 1230      Chief Complaint  Patient presents with  . Urinary Retention    The history is provided by the patient.   the patient underwent a TURP 2 days ago and presented to the urology office today with the inability to void.  He had a Foley catheter placed and drained 1700 cc of urine.  They noticed that he was in acute renal failure in the office and sent her to the emergency department to be admitted.  The patient reports he no longer has abdominal pain.his Foley catheter has been placed in a minute he is trained all his urine.  He denies nausea and vomiting.  He has no flank pain.  He denies fevers or chills.  He has no dysuria.  Past Medical History  Diagnosis Date  . Hypertension   . Shortness of breath     with exertion   . Neuromuscular disorder     restless leg syndrome   . Arthritis   . Chronic kidney disease     BPH    Past Surgical History  Procedure Date  . Hernia repair   . Back surgery   . Transurethral resection of prostate 03/24/2012    Procedure: TRANSURETHRAL RESECTION OF THE PROSTATE (TURP);  Surgeon: [name], MD;  Location: [facility];  Service: Urology;  Laterality: N/A;  with gyrus ,Greenlight PVP laser of Prostate    History reviewed. No pertinent family history.  History  Substance Use Topics  . Smoking status: Never Smoker   . Smokeless tobacco: Never Used  . Alcohol Use: 1.2 oz/week    2 Cans of beer per week      Review of Systems  All other systems reviewed and are negative.    Allergies  Review of patient's allergies indicates no known allergies.  Home Medications   Current Outpatient Rx  Name Route Sig Dispense Refill  . CALCIUM 600 + D PO Oral Take 1 tablet by mouth daily.    . CEPHALEXIN 500 MG PO CAPS Oral Take 500 mg by mouth 2 (two) times daily.    Marland Kitchen DOCUSATE SODIUM 100 MG PO CAPS Oral Take 100 mg  by mouth 2 (two) times daily.    . OMEGA-3 FATTY ACIDS 1000 MG PO CAPS Oral Take 1 g by mouth daily.    Marland Kitchen GABAPENTIN 600 MG PO TABS Oral Take 600 mg by mouth at bedtime.    . ADULT MULTIVITAMIN W/MINERALS CH Oral Take 1 tablet by mouth daily.    Marland Kitchen OLMESARTAN MEDOXOMIL-HCTZ 40-12.5 MG PO TABS Oral Take 1 tablet by mouth every morning.    . OXYCODONE-ACETAMINOPHEN 5-325 MG PO TABS Oral Take 1 tablet by mouth every 4 (four) hours as needed.    Marland Kitchen ROPINIROLE HCL ER 4 MG PO TB24 Oral Take 4 mg by mouth daily at 2 PM daily at 2 PM.     . TAMSULOSIN HCL 0.4 MG PO CAPS Oral Take 0.4 mg by mouth 2 (two) times daily.       BP 124/69  Pulse 81  Temp 97.6 F (36.4 C) (Oral)  Resp 16  SpO2 93%  Physical Exam  Nursing note and vitals reviewed. Constitutional: He is oriented to person, place, and time. He appears well-developed and well-nourished.  HENT:  Head: Normocephalic and atraumatic.  Eyes: EOM are normal.  Neck:  Normal range of motion.  Cardiovascular: Normal rate, regular rhythm, normal heart sounds and intact distal pulses.   Pulmonary/Chest: Effort normal and breath sounds normal. No respiratory distress.  Abdominal: Soft. He exhibits no distension. There is no tenderness.  Genitourinary:       Foley catheter in place.  The patient drained 1700 mL  Musculoskeletal: Normal range of motion.  Neurological: He is alert and oriented to person, place, and time.  Skin: Skin is warm and dry.  Psychiatric: He has a normal mood and affect. Judgment normal.    ED Course  Procedures (including critical care time)  Labs Reviewed  URINALYSIS, MICROSCOPIC ONLY - Abnormal; Notable for the following:    APPearance CLOUDY (*)     Hgb urine dipstick LARGE (*)     Leukocytes, UA SMALL (*)     Bacteria, UA FEW (*)     All other components within normal limits  CBC - Abnormal; Notable for the following:    RBC 3.67 (*)     Hemoglobin 12.3 (*)     HCT 35.7 (*)     All other components within  normal limits  BASIC METABOLIC PANEL - Abnormal; Notable for the following:    Glucose, Bld 111 (*)     BUN 32 (*)     Creatinine, Ser 2.49 (*)     GFR calc non Af Amer 22 (*)     GFR calc Af Amer 26 (*)     All other components within normal limits  URINE CULTURE   No results found.    I reviewed available ER/hospitalization records thought the EMR   1. Urinary retention   2. Uropathy obstructive       MDM  I spoke with the urology team who recommended that the patient be admitted overnight for observation given his postobstructive uropathy in the renal failure.        Lyanne Co, MD 03/27/12 1525

## 2012-03-27 NOTE — Progress Notes (Signed)
Patient is 78 year old male sent from urology office to be admitted for further observation for acute kidney injury. Patient was in urology office for follow up status post TURP. Patient has had distended bladder and once foley put in 1,700 cc urine cleared.  Vital signs reviewed, patient is stable for med-surg admission.  Manson Passey [facility] [phone]

## 2012-03-27 NOTE — ED Notes (Signed)
ZOX:[id number]<AV> Expected date:03/27/12<BR> Expected time:11:39 AM<BR> Means of arrival:<BR> Comments:<BR> Dutch Quint from [facility]/Urinary retention.  S/P TURP

## 2012-03-27 NOTE — ED Notes (Signed)
md at bedside  Pt alert and oriented x4. Respirations even and unlabored, bilateral symmetrical rise and fall of chest. Skin warm and dry. In no acute distress. Denies needs.   

## 2012-03-27 NOTE — ED Notes (Signed)
Report given to fred, rn on floor

## 2012-03-27 NOTE — Consult Note (Signed)
  Pt s/p TURP on Tuesday. Despite passing a void trial Wednesday he did not void yesterday. One issue is constipation and he has not had a BM since Monday. He had > 1 L drained from bladder earlier and noted to have acute renal failure. He has no complaints now.  A&Ox3 NAD GU - foley draining well. Urine clear  IMP -  S/p TURP Constipation - rec MOM or miralax. Do not give per rectum treatment (enema's , etc.) ARF Acute urinary retention  Plan -  Cont. Foley. ARF should resolve. Appreciate [facility] admission. Pt should keep foley and f/u with me in 1-2 weeks for void trial.  Cont tamsulosin daily.

## 2012-03-27 NOTE — H&P (Addendum)
[facility] History and Physical  Brandon Mason YNW:[id number] DOB: 1926/02/03 DOA: 03/27/2012  Referring physician: ER physician PCP: Pamelia Hoit, MD   Chief Complaint: acute urinary retention  HPI:  Patient is a 78 year old male with recent TURP who was seen in urology office today as he did not have any urine output. When foley inserted patient has cleared around 1,700 cc of clear urine. Per urology, patient needed to be admitted for observation. Evaluation in ED revealed acute kidney injury.  Patient has had no complaints of abdominal pain, no nausea or vomiting, no chest pain, no shortness of breath, no palpitations. No dizziness or loss of consciousness. No reports of blood in stool or urine.  Principal Problem:  *Acute kidney injury in the setting of acute urinary retention - foley in place - will obtain BMP in am - may continue Flomax - fluids for next 12 hours - added miralax for constipation  Manson Passey [facility]  [phone]   Review of Systems:  Constitutional: Negative for fever, chills and malaise/fatigue. Negative for diaphoresis.  HENT: Negative for hearing loss, ear pain, nosebleeds, congestion, sore throat, neck pain, tinnitus and ear discharge.   Eyes: Negative for blurred vision, double vision, photophobia, pain, discharge and redness.  Respiratory: Negative for cough, hemoptysis, sputum production, shortness of breath, wheezing and stridor.   Cardiovascular: Negative for chest pain, palpitations, orthopnea, claudication and leg swelling.  Gastrointestinal: Negative for nausea, vomiting and abdominal pain. Negative for heartburn, constipation, blood in stool and melena.  Genitourinary: per HPI.  Musculoskeletal: Negative for myalgias, back pain, joint pain and falls.  Skin: Negative for itching and rash.  Neurological: Negative for dizziness and weakness. Negative for tingling, tremors, sensory change, speech change, focal weakness, loss of consciousness  and headaches.  Endo/Heme/Allergies: Negative for environmental allergies and polydipsia. Does not bruise/bleed easily.  Psychiatric/Behavioral: Negative for suicidal ideas. The patient is not nervous/anxious.      Past Medical History  Diagnosis Date  . Hypertension   . Shortness of breath     with exertion   . Neuromuscular disorder     restless leg syndrome   . Arthritis   . Chronic kidney disease     BPH   Past Surgical History  Procedure Date  . Hernia repair   . Back surgery   . Transurethral resection of prostate 03/24/2012    Procedure: TRANSURETHRAL RESECTION OF THE PROSTATE (TURP);  Surgeon: [name], MD;  Location: [facility];  Service: Urology;  Laterality: N/A;  with gyrus ,Greenlight PVP laser of Prostate   Social History:  reports that he has never smoked. He has never used smokeless tobacco. He reports that he drinks about 1.2 ounces of alcohol per week. He reports that he does not use illicit drugs.  No Known Allergies  Family History: heart disease in father  Prior to Admission medications   Medication Sig Start Date End Date Taking? Authorizing Provider  Calcium Carbonate-Vitamin D (CALCIUM 600 + D PO) Take 1 tablet by mouth daily.   Yes Historical Provider, MD  cephALEXin (KEFLEX) 500 MG capsule Take 500 mg by mouth 2 (two) times daily. 03/24/12  Yes [name], MD  docusate sodium (COLACE) 100 MG capsule Take 100 mg by mouth 2 (two) times daily. 03/24/12  Yes [name], MD  fish oil-omega-3 fatty acids 1000 MG capsule Take 1 g by mouth daily.   Yes Historical Provider, MD  gabapentin (NEURONTIN) 600 MG tablet Take 600 mg by  mouth at bedtime.   Yes Historical Provider, MD  Multiple Vitamin (MULTIVITAMIN WITH MINERALS) TABS Take 1 tablet by mouth daily.   Yes Historical Provider, MD  olmesartan-hydrochlorothiazide (BENICAR HCT) 40-12.5 MG per tablet Take 1 tablet by mouth every morning.   Yes Historical Provider, MD    oxyCODONE-acetaminophen (PERCOCET/ROXICET) 5-325 MG per tablet Take 1 tablet by mouth every 4 (four) hours as needed. 03/24/12  Yes [name], MD  rOPINIRole (REQUIP XL) 4 MG 24 hr tablet Take 4 mg by mouth daily at 2 PM daily at 2 PM.    Yes Historical Provider, MD  Tamsulosin HCl (FLOMAX) 0.4 MG CAPS Take 0.4 mg by mouth 2 (two) times daily.    Yes Historical Provider, MD   Physical Exam: Filed Vitals:   03/27/12 1238  BP: 124/69  Pulse: 81  Temp: 97.6 F (36.4 C)  TempSrc: Oral  Resp: 16  SpO2: 93%    Physical Exam  Constitutional: Appears well-developed and well-nourished. No distress.  HENT: Normocephalic. External right and left ear normal. Oropharynx is clear and moist.  Eyes: Conjunctivae and EOM are normal. PERRLA, no scleral icterus.  Neck: Normal ROM. Neck supple. No JVD. No tracheal deviation. No thyromegaly.  CVS: RRR, S1/S2 +, no murmurs, no gallops, no carotid bruit.  Pulmonary: Effort and breath sounds normal, no stridor, rhonchi, wheezes, rales.  Abdominal: Soft. BS +,  no distension, tenderness, rebound or guarding.  Musculoskeletal: Normal range of motion. No edema and no tenderness.  Lymphadenopathy: No lymphadenopathy noted, cervical, inguinal. Neuro: Alert. Normal reflexes, muscle tone coordination. No cranial nerve deficit. Skin: Skin is warm and dry. No rash noted. Not diaphoretic. No erythema. No pallor.  Psychiatric: Normal mood and affect. Behavior, judgment, thought content normal.   Labs on Admission:  Basic Metabolic Panel:  Lab 03/27/12 7829  NA 136  K 4.1  CL 99  CO2 29  GLUCOSE 111*  BUN 32*  CREATININE 2.49*  CALCIUM 9.0   CBC:  Lab 03/27/12 1345  WBC 10.4  HGB 12.3*  HCT 35.7*  MCV 97.3  PLT 182   Radiological Exams on Admission: No results found.  EKG: Normal sinus rhythm, no ST/T wave changes  Code Status: Full Family Communication: Pt at bedside Disposition Plan: Admit for further evaluation  Manson Passey, MD  [facility] Pager [phone]  If 7PM-7AM, please contact night-coverage [email or web address] Password [facility] 03/27/2012, 4:59 PM

## 2012-03-27 NOTE — ED Notes (Signed)
Pt alert and oriented x4. Respirations even and unlabored, bilateral symmetrical rise and fall of chest. Skin warm and dry. In no acute distress. Denies needs.   

## 2012-03-27 NOTE — ED Notes (Signed)
Pt reports that he has prostate surgery on Tues. Did not urinate wed or Thursday. Pt came to [facility] urology today. They inserted catheter and checked some blood work. Reported "his kidneys were damaged". Pt reported they drained 1000 ml urine. rn just emptied leg bag 700 ml present. Pt reports he was having abdominal pain 10/10, but took pain meds at home. Pt has not had bowel movement since Monday.

## 2012-03-28 LAB — CBC
HCT: 31.4 % — ABNORMAL LOW (ref 39.0–52.0)
Hemoglobin: 10.8 g/dL — ABNORMAL LOW (ref 13.0–17.0)
RBC: 3.22 MIL/uL — ABNORMAL LOW (ref 4.22–5.81)

## 2012-03-28 LAB — COMPREHENSIVE METABOLIC PANEL
CO2: 29 mEq/L (ref 19–32)
Calcium: 8.5 mg/dL (ref 8.4–10.5)
Creatinine, Ser: 1.32 mg/dL (ref 0.50–1.35)
GFR calc Af Amer: 56 mL/min — ABNORMAL LOW (ref >90)
GFR calc non Af Amer: 48 mL/min — ABNORMAL LOW (ref >90)
Glucose, Bld: 102 mg/dL — ABNORMAL HIGH (ref 70–99)
Total Protein: 5.6 g/dL — ABNORMAL LOW (ref 6.0–8.3)

## 2012-03-28 LAB — URINE CULTURE: Colony Count: NO GROWTH

## 2012-03-28 LAB — GLUCOSE, CAPILLARY: Glucose-Capillary: 99 mg/dL (ref 70–99)

## 2012-03-28 MED ORDER — PEG 3350-KCL-NA BICARB-NACL 420 G PO SOLR
4000.0000 mL | Freq: Once | ORAL | Status: AC
  Administered 2012-03-28: 4000 mL via ORAL
  Filled 2012-03-28: qty 4000

## 2012-03-28 NOTE — Progress Notes (Signed)
Patient ID: Brandon Mason, male   DOB: [birth year], 78 y.o.   MRN: [id number]    Subjective:  No new complaints.  Objective: Vital signs in last 24 hours: Temp:  [97.9 F (36.6 C)-98.6 F (37 C)] 98.2 F (36.8 C) (09/28 0641) Pulse Rate:  [85-100] 85  (09/28 0641) Resp:  [18-20] 20  (09/28 0641) BP: (126-144)/(67-88) 140/72 mmHg (09/28 0641) SpO2:  [93 %-94 %] 93 % (09/28 0641) Weight:  [74.844 kg (165 lb)-75.297 kg (166 lb)] 74.844 kg (165 lb) (09/28 0641)  Intake/Output from previous day: 09/27 0701 - 09/28 0700 In: -  Out: 1250 [Urine:1250] Intake/Output this shift: Total I/O In: -  Out: 725 [Urine:725]   Lab Results:  [facility] 03/28/12 0355 03/27/12 1345  HGB 10.8* 12.3*  HCT 31.4* 35.7*   BMET  Basename 03/28/12 0355 03/27/12 1345  NA 139 136  K 3.9 4.1  CL 102 99  CO2 29 29  GLUCOSE 102* 111*  BUN 26* 32*  CREATININE 1.32 2.49*  CALCIUM 8.5 9.0     Studies/Results: No results found.  Assessment/Plan: Renal function improved.  Will need followup with Dr. Mena Goes for outpatient voiding trial.   LOS: 1 day   Areona Homer,LES 03/28/2012, 2:40 PM

## 2012-03-28 NOTE — Progress Notes (Signed)
Patient received discharge instructions and verbalized understanding without further questions. Patient follow up appointments discussed. Patient belongings packed. Patient to ambulate downstairs supervised.

## 2012-03-28 NOTE — Discharge Summary (Signed)
Physician Discharge Summary  Brandon Mason ZOX:[id number] DOB: 1926/03/24 DOA: 03/27/2012  PCP: Pamelia Hoit, MD  Admit date: 03/27/2012 Discharge date: 03/28/2012  Discharge Diagnoses:  Principal Problem:  *Acute kidney injury  Discharge Condition: medically stable for discharge home today with foley; pt will follow up with urology for voiding trial.  Diet recommendation: as tolerated  History of present illness: Patient is a 78 year old male with recent TURP who was seen in urology office today as he did not have any urine output. When foley inserted patient has cleared around 1,700 cc of clear urine. Per urology, patient needed to be admitted for observation.  Evaluation in ED revealed acute kidney injury.  Patient has had no complaints of abdominal pain, no nausea or vomiting, no chest pain, no shortness of breath, no palpitations. No dizziness or loss of consciousness. No reports of blood in stool or urine.   Principal Problem:  *Acute kidney injury in the setting of acute urinary retention  - foley in place  - this morning BMP shows creatinine WNL - may continue Flomax   - added miralax and Golytely for constipation and patient has had a large BM prior to discharge - pt will follow up with urology per scheduled appointment  Manson Passey  [facility]  [phone]   Discharge Exam: Filed Vitals:   03/28/12 0641  BP: 140/72  Pulse: 85  Temp: 98.2 F (36.8 C)  Resp: 20   Filed Vitals:   03/27/12 1730 03/27/12 1754 03/27/12 2200 03/28/12 0641  BP: 126/68 129/67 144/88 140/72  Pulse: 96 100 98 85  Temp: 97.9 F (36.6 C) 98.1 F (36.7 C) 98.6 F (37 C) 98.2 F (36.8 C)  TempSrc: Oral Oral Oral Oral  Resp: 20 18 20 20   Height:  5\' 9"  (1.753 m)    Weight:  75.297 kg (166 lb)  74.844 kg (165 lb)  SpO2: 94% 93% 93% 93%    General: Pt is alert, follows commands appropriately, not in acute distress Cardiovascular: Regular rate and rhythm, S1/S2 +, no murmurs, no rubs, no  gallops Respiratory: Clear to auscultation bilaterally, no wheezing, no crackles, no rhonchi Abdominal: Soft, non tender, non distended, bowel sounds +, no guarding Extremities: no edema, no cyanosis, pulses palpable bilaterally DP and PT Neuro: Grossly nonfocal  Discharge Instructions     Medication List     As of 03/28/2012  8:38 AM    TAKE these medications         CALCIUM 600 + D PO   Take 1 tablet by mouth daily.      cephALEXin 500 MG capsule   Commonly known as: KEFLEX   Take 500 mg by mouth 2 (two) times daily.      docusate sodium 100 MG capsule   Commonly known as: COLACE   Take 100 mg by mouth 2 (two) times daily.      fish oil-omega-3 fatty acids 1000 MG capsule   Take 1 g by mouth daily.      gabapentin 600 MG tablet   Commonly known as: NEURONTIN   Take 600 mg by mouth at bedtime.      multivitamin with minerals Tabs   Take 1 tablet by mouth daily.      olmesartan-hydrochlorothiazide 40-12.5 MG per tablet   Commonly known as: BENICAR HCT   Take 1 tablet by mouth every morning.      oxyCODONE-acetaminophen 5-325 MG per tablet   Commonly known as: PERCOCET/ROXICET   Take 1 tablet  by mouth every 4 (four) hours as needed.      rOPINIRole 4 MG 24 hr tablet   Commonly known as: REQUIP XL   Take 4 mg by mouth daily at 2 PM daily at 2 PM.      Tamsulosin HCl 0.4 MG Caps   Commonly known as: FLOMAX   Take 0.4 mg by mouth 2 (two) times daily.           Follow-up Information    Follow up with [place]ridge, Lowella Petties, MD.   Contact information:   [address] [facility] [place] [facility] [place] [address] [phone]           The results of significant diagnostics from this hospitalization (including imaging, microbiology, ancillary and laboratory) are listed below for reference.    Significant Diagnostic Studies: No results found.  Microbiology: Recent Results (from the past 240 hour(s))    SURGICAL PCR SCREEN     Status: Normal   Collection Time   03/18/12  1:58 PM      Component Value Range Status Comment   MRSA, PCR NEGATIVE  NEGATIVE Final    Staphylococcus aureus NEGATIVE  NEGATIVE Final      Labs: Basic Metabolic Panel:  Lab 03/28/12 1308 03/27/12 1345  NA 139 136  K 3.9 4.1  CL 102 99  CO2 29 29  GLUCOSE 102* 111*  BUN 26* 32*  CREATININE 1.32 2.49*  CALCIUM 8.5 9.0  MG -- --  PHOS -- --   Liver Function Tests:  Lab 03/28/12 0355  AST 13  ALT 9  ALKPHOS 50  BILITOT 0.3  PROT 5.6*  ALBUMIN 2.7*   CBC:  Lab 03/28/12 0355 03/27/12 1345  WBC 8.5 10.4  HGB 10.8* 12.3*  HCT 31.4* 35.7*  MCV 97.5 97.3  PLT 170 182   Cardiac Enzymes: No results found for this basename: CKTOTAL:5,CKMB:5,CKMBINDEX:5,TROPONINI:5 in the last 168 hours BNP: BNP (last 3 results) No results found for this basename: PROBNP:3 in the last 8760 hours CBG:  Lab 03/28/12 0737  GLUCAP 99    Time coordinating discharge: Over 30 minutes  Signed:  Manson Passey, MD  [facility] 03/28/2012, 8:38 AM  Pager #: [phone]

## 2012-04-08 ENCOUNTER — Emergency Department (HOSPITAL_COMMUNITY)
Admission: EM | Admit: 2012-04-08 | Discharge: 2012-04-08 | Disposition: A | Discharge status: Home / Self Care | Payer: Medicare Other | Attending: Emergency Medicine | Admitting: Emergency Medicine

## 2012-04-08 ENCOUNTER — Encounter (HOSPITAL_COMMUNITY): Payer: Self-pay | Admitting: Family Medicine

## 2012-04-08 DIAGNOSIS — M129 Arthropathy, unspecified: Secondary | ICD-10-CM | POA: Insufficient documentation

## 2012-04-08 DIAGNOSIS — R339 Retention of urine, unspecified: Secondary | ICD-10-CM

## 2012-04-08 DIAGNOSIS — R338 Other retention of urine: Secondary | ICD-10-CM | POA: Insufficient documentation

## 2012-04-08 DIAGNOSIS — G2581 Restless legs syndrome: Secondary | ICD-10-CM | POA: Insufficient documentation

## 2012-04-08 DIAGNOSIS — I10 Essential (primary) hypertension: Secondary | ICD-10-CM | POA: Insufficient documentation

## 2012-04-08 DIAGNOSIS — Z9889 Other specified postprocedural states: Secondary | ICD-10-CM | POA: Insufficient documentation

## 2012-04-08 HISTORY — DX: Disorder of prostate, unspecified: N42.9

## 2012-04-08 LAB — URINALYSIS, ROUTINE W REFLEX MICROSCOPIC
Bilirubin Urine: NEGATIVE
Nitrite: NEGATIVE
Specific Gravity, Urine: 1.014 (ref 1.005–1.030)
Urobilinogen, UA: 0.2 mg/dL (ref 0.0–1.0)

## 2012-04-08 LAB — URINE MICROSCOPIC-ADD ON

## 2012-04-08 MED ORDER — CEFPODOXIME PROXETIL 200 MG PO TABS: 200.0000 mg | ORAL_TABLET | Freq: Two times a day (BID) | ORAL | Status: DC

## 2012-04-08 MED ORDER — LIDOCAINE HCL 2 % EX GEL
Freq: Once | CUTANEOUS | Status: AC
  Administered 2012-04-08: 10 via URETHRAL
  Filled 2012-04-08: qty 10

## 2012-04-08 NOTE — ED Notes (Signed)
Bladder scan shows greater than 653 ml urine.

## 2012-04-08 NOTE — ED Provider Notes (Signed)
History     CSN: [id number]  Arrival date & time 04/08/12  2201   First MD Initiated Contact with Patient 04/08/12 2313      Chief Complaint  Patient presents with  . Urinary Retention    (Consider location/radiation/quality/duration/timing/severity/associated sxs/prior treatment) HPI Hx per PT. Recent TURP 2 weeks ago, presented initially with urinary retention, post op doing OK, has finished ABx.  Followed by DR Mena Goes. HAd foley removed this am and tonight unable to urinate. No F/C. No N/V. Presenting lower ABD fullness mod to severe worsening last few hours.  Past Medical History  Diagnosis Date  . Hypertension   . Shortness of breath     with exertion   . Neuromuscular disorder     restless leg syndrome   . Arthritis   . Chronic kidney disease     BPH  . Prostate disorder     Past Surgical History  Procedure Date  . Hernia repair   . Back surgery   . Transurethral resection of prostate 03/24/2012    Procedure: TRANSURETHRAL RESECTION OF THE PROSTATE (TURP);  Surgeon: [name], MD;  Location: [facility];  Service: Urology;  Laterality: N/A;  with gyrus ,Greenlight PVP laser of Prostate    No family history on file.  History  Substance Use Topics  . Smoking status: Never Smoker   . Smokeless tobacco: Never Used  . Alcohol Use: 1.2 oz/week    2 Cans of beer per week     2 cans of beer per week       Review of Systems  Constitutional: Negative for fever and chills.  HENT: Negative for neck pain and neck stiffness.   Eyes: Negative for pain.  Respiratory: Negative for shortness of breath.   Cardiovascular: Negative for chest pain.  Gastrointestinal: Negative for abdominal pain.  Genitourinary: Positive for difficulty urinating. Negative for dysuria, hematuria, flank pain and testicular pain.  Musculoskeletal: Negative for back pain.  Skin: Negative for rash.  Neurological: Negative for headaches.  All other systems reviewed and are  negative.    Allergies  Review of patient's allergies indicates no known allergies.  Home Medications   Current Outpatient Rx  Name Route Sig Dispense Refill  . CALCIUM 600 + D PO Oral Take 1 tablet by mouth daily.    . CEPHALEXIN 500 MG PO CAPS Oral Take 500 mg by mouth 2 (two) times daily.    Marland Kitchen DOCUSATE SODIUM 100 MG PO CAPS Oral Take 100 mg by mouth 2 (two) times daily.    . OMEGA-3 FATTY ACIDS 1000 MG PO CAPS Oral Take 1 g by mouth daily.    Marland Kitchen GABAPENTIN 600 MG PO TABS Oral Take 600 mg by mouth at bedtime.    . ADULT MULTIVITAMIN W/MINERALS CH Oral Take 1 tablet by mouth daily.    Marland Kitchen OLMESARTAN MEDOXOMIL-HCTZ 40-12.5 MG PO TABS Oral Take 1 tablet by mouth every morning.    . OXYCODONE-ACETAMINOPHEN 5-325 MG PO TABS Oral Take 1 tablet by mouth every 4 (four) hours as needed.    Marland Kitchen ROPINIROLE HCL ER 4 MG PO TB24 Oral Take 4 mg by mouth daily at 2 PM daily at 2 PM.     . TAMSULOSIN HCL 0.4 MG PO CAPS Oral Take 0.4 mg by mouth 2 (two) times daily.       BP 140/76  Pulse 92  Temp 97.9 F (36.6 C) (Oral)  Resp 20  SpO2 96%  Physical Exam  Constitutional: He  is oriented to person, place, and time. He appears well-developed and well-nourished.  HENT:  Head: Normocephalic and atraumatic.  Eyes: Conjunctivae normal and EOM are normal. Pupils are equal, round, and reactive to light.  Neck: Trachea normal. Neck supple. No thyromegaly present.  Cardiovascular: Normal rate, regular rhythm, S1 normal, S2 normal and normal pulses.     No systolic murmur is present   No diastolic murmur is present  Pulses:      Radial pulses are 2+ on the right side, and 2+ on the left side.  Pulmonary/Chest: Effort normal and breath sounds normal. He has no wheezes. He has no rhonchi. He has no rales. He exhibits no tenderness.  Abdominal: Soft. Normal appearance and bowel sounds are normal. There is no CVA tenderness and negative Murphy's sign.       TTP suprapubic with fullness over bladder.    Musculoskeletal:       BLE:s Calves nontender, no cords or erythema, negative Homans sign  Neurological: He is alert and oriented to person, place, and time. He has normal strength. No cranial nerve deficit or sensory deficit. GCS eye subscore is 4. GCS verbal subscore is 5. GCS motor subscore is 6.  Skin: Skin is warm and dry. No rash noted. He is not diaphoretic.  Psychiatric: His speech is normal.       Cooperative and appropriate    ED Course  Procedures (including critical care time)  Foley placed and had 500cc UOP. UA reviewed  Results for orders placed during the hospital encounter of 04/08/12  URINALYSIS, ROUTINE W REFLEX MICROSCOPIC      Component Value Range   Color, Urine YELLOW  YELLOW   APPearance CLOUDY (*) CLEAR   Specific Gravity, Urine 1.014  1.005 - 1.030   pH 6.0  5.0 - 8.0   Glucose, UA NEGATIVE  NEGATIVE mg/dL   Hgb urine dipstick LARGE (*) NEGATIVE   Bilirubin Urine NEGATIVE  NEGATIVE   Ketones, ur NEGATIVE  NEGATIVE mg/dL   Protein, ur 30 (*) NEGATIVE mg/dL   Urobilinogen, UA 0.2  0.0 - 1.0 mg/dL   Nitrite NEGATIVE  NEGATIVE   Leukocytes, UA MODERATE (*) NEGATIVE  URINE MICROSCOPIC-ADD ON      Component Value Range   WBC, UA 11-20  <3 WBC/hpf   RBC / HPF 21-50  <3 RBC/hpf   Bacteria, UA MANY (*) RARE   U Cx sent. Plan ABx and leg bag and follow up Urology.  PT verbalizes understanding UTI and foley precautions. Rx Vantin  MDM   Urinary retention after foley removed today, foley replaced and pain improved with good UOP. Plan Urology follow up.         Sunnie Nielsen, MD 04/08/12 [phone]

## 2012-04-08 NOTE — ED Notes (Signed)
Patient states that he had his catheter removed this morning and that he has only been able to void small amounts since that time. States catheter had been in for 2 weeks for urinary retention s/p prostate surgery.

## 2012-04-14 ENCOUNTER — Other Ambulatory Visit: Payer: Self-pay | Admitting: Urology

## 2012-04-20 ENCOUNTER — Encounter (HOSPITAL_COMMUNITY): Payer: Self-pay | Admitting: Pharmacy Technician

## 2012-04-24 ENCOUNTER — Encounter (HOSPITAL_COMMUNITY): Payer: Self-pay

## 2012-04-27 ENCOUNTER — Encounter (HOSPITAL_COMMUNITY): Payer: Self-pay

## 2012-04-27 ENCOUNTER — Encounter (HOSPITAL_COMMUNITY)
Admission: RE | Admit: 2012-04-27 | Discharge: 2012-04-27 | Disposition: A | Discharge status: Home / Self Care | Payer: Medicare Other | Source: Ambulatory Visit | Attending: Urology | Admitting: Urology

## 2012-04-27 HISTORY — DX: Cardiomegaly: I51.7

## 2012-04-27 LAB — CBC
HCT: 35.6 % — ABNORMAL LOW (ref 39.0–52.0)
MCH: 33.1 pg (ref 26.0–34.0)
MCV: 97.3 fL (ref 78.0–100.0)
RDW: 12.5 % (ref 11.5–15.5)
WBC: 6.8 10*3/uL (ref 4.0–10.5)

## 2012-04-27 LAB — BASIC METABOLIC PANEL
BUN: 20 mg/dL (ref 6–23)
Chloride: 103 mEq/L (ref 96–112)
Creatinine, Ser: 0.88 mg/dL (ref 0.50–1.35)
GFR calc Af Amer: 90 mL/min — ABNORMAL LOW (ref >90)

## 2012-04-27 NOTE — H&P (Signed)
History of Present Illness  F/u BPH and LUTS.   1 - urgency - referred by Dr. Vickey Huger Jul 2013. Bothersome daytime urgency and barely makes it to the bathroom. He has no frequency. He voids with a good stream. He has no dysuria or hematuria. He feels his bladder is empty. Nocturia 3-7. He doesn't void much urine with each trip. He is on tamsulosin for a couple of years. He had a sleep study and did not have sleep apnea. He has occasional LE edema. He saw Dr. Earlene Plater with virtually the same history in 2010 and was started on tamsulosin. He was given samples of Vesicare, but doesnt recall if he took it. Most bothersome symptoms are urgency, nocturia and weak stream.   He as significant issues with RLS and has been on Mirapax and Requip. He is was given imipramine and took it for a month but says he did not refill. He noticed no difference in LUTS.  2- BPH - pt with BPH and BOO on cystoscopy Sept 2013. UDS Aug 2013 - small capacity unstable detrusor. No leakage but voided off an unstable contraction. EMG up slightly due to straining. Obstructed flow pattern, PVR 40 ml.  Patient underwent combination green light laser photo vaporization but also needed some bipolar resection due to his large prostate on March 23, 2012. He passed a voiding trial on postop day one but presented postop day 4 and urinary retention with bilateral hydronephrosis and acute renal failure. A Foley was placed and he was admitted to the hospitalist service. His Cr quickly corrected.   Renal U/S Apr 08, 2012  - I reviewed all the images, findings: The right kidney is normal in appearance without mass or hydronephrosis. The left kidney is normal in appearance without mass or hydronephrosis. No ureters were seen. The bladder decompressed with a Foley catheter and the balloon was visualized. No ureters seen near the bladder.  Interval history Patient's Foley was removed October 9 he went into retention that evening and had a catheter  placed in the ER. He was started on Cefpodoxime. 3 days later he had fever to 103 but has since defervesced.  He would like to proceed with a void trial and I recommended he undergo cystoscopy to fill his bladder to rule out intraurethral or intravesical calls to his retention. We discussed he has quite a large prostate and may need further resection.    Past Medical History Problems  1. History of  Anxiety (Symptom) 300.00 2. History of  Arthritis V13.4 3. History of  Depression 311 4. History of  Gastric Ulcer 531.90 5. History of  Hypertension 401.9 6. History of  Hypertension 401.9  Surgical History Problems  1. History of  Back Surgery 2. History of  Inguinal Hernia Repair 3. History of  Laser Vaporization With Transurethral Resection Of Prostate  Current Meds 1. Benicar HCT 40-12.5 MG Oral Tablet; Therapy: 07Jul2012 to 2. Cefpodoxime Proxetil 200 MG Oral Tablet; Therapy: (Recorded:14Oct2013) to 3. Gabapentin 600 MG Oral Tablet; Therapy: 05Feb2013 to 4. Imipramine HCl 10 MG Oral Tablet; Therapy: 17Jun2013 to 5. Requip XL 4 MG Oral Tablet Extended Release 24 Hour; Therapy: 08May2013 to 6. Tamsulosin HCl 0.4 MG Oral Capsule; Therapy: 08Apr2013 to  Allergies Medication  1. No Known Drug Allergies  Family History Problems  1. Paternal history of  Congestive Heart Failure 2. Family history of  Death In The Family Mother 3. Family history of  Family Health Status - Father's Age Died from CHF 4.  Family history of  Family Health Status - Mother's Age Died from 60 from heart failure 5. Family history of  Family Health Status Number Of Children 5 children 6. Maternal history of  Heart Disease V17.49 7. Paternal history of  Hypertension V17.49 8. Paternal history of  Renal Disorder  Social History Problems  1. Alcohol Use 3 per week 2. Marital History - Currently Married 3. Never A Smoker Denied  4. History of  Caffeine Use 5. History of  Former Smoker  Review of  Systems Constitutional, skin, eye, otolaryngeal, hematologic/lymphatic, cardiovascular, pulmonary, endocrine, musculoskeletal, gastrointestinal, neurological and psychiatric system(s) were reviewed and pertinent findings if present are noted.  Constitutional: fever.  Now resolved.    Vitals  14Oct2013 08:30AM Blood Pressure: 136 / 88 Temperature: 98.2 F Heart Rate: 88   Physical Exam Constitutional: Well nourished and well developed . No acute distress.  Pulmonary: No respiratory distress and normal respiratory rhythm and effort.  Cardiovascular: Heart rate and rhythm are normal . No peripheral edema.  Neuro/Psych:. Mood and affect are appropriate.    Procedure  Procedure: Cystoscopy   Indication: Lower Urinary Tract Symptoms.  Informed Consent: Risks, benefits, and potential adverse events were discussed and informed consent was obtained from the patient.  Prep: The patient was prepped with betadine.  Antibiotic prophylaxis:. Cefpodoxime.  Procedure Note:  Urethral meatus:. No abnormalities.  Anterior urethra: No abnormalities.  Prostatic urethra: No abnormalities . There was visual obstruction of the prostatic urethra. The lateral prostatic lobes were enlarged.  Bladder: Visulization was clear. The ureteral orifices were in the normal anatomic position bilaterally and had clear efflux of urine. A systematic survey of the bladder demonstrated no bladder tumors or stones. The mucosa was smooth without abnormalities. The patient tolerated the procedure well.     He was filled with 400 mL of fluid. He voided 200 mL's with an intermittent flow and straining.  Complications: None.    Assessment Assessed  1. Acute Urinary Retention 788.20 2. Benign Prostatic Hypertrophy 600.00  Plan Acute Renal Failure (584.9)  1. Cysto  Done: 14Oct2013 Acute Urinary Retention (788.20)  2. Ciprofloxacin HCl 250 MG Oral Tablet; Take 1 tablet daily; Therapy: 14Oct2013 to  (Evaluate:13Nov2013)   Requested for: 14Oct2013; Last Rx:14Oct2013; Edited  Discussion/Summary     I discussed with this patient he has a rather large prostate and still has some obstructing tissue. We discussed the nature risks and benefits of continued Foley catheter, learning CIC, or proceeding with repeat transurethral resection of the prostate as he still has some significant lateral lobe hypertrophy. We also discussed continuing medications with tamsulosin and adding finasteride. All questions answered. He elects to proceed with staged TURP. We discussed he has a rather large prostate and bladder outlet obstruction on preop urodynamics and I believe this continues. We discussed the rationale for a staged TURP as only so much tissue could be resected at one time and temper our resection due to risks of bleeding, stricture and incontinence among others. We discussed the DRE is only an estimate of prostate size and I probably underestimated the size of his prostate on DRE.   A new Foley was placed today. He will start a low-dose antibiotic daily once his current prescriptions ends.   cc: Dr. Benedetto Goad      Signatures Electronically signed by : Jerilee Field, M.D.; Apr 14 2012  4:21PM

## 2012-04-27 NOTE — Progress Notes (Signed)
EKG 03-18-2012 in Epic and CXR 01-06-2012 [facility]

## 2012-04-28 ENCOUNTER — Ambulatory Visit (HOSPITAL_COMMUNITY): Payer: Medicare Other | Admitting: Anesthesiology

## 2012-04-28 ENCOUNTER — Encounter (HOSPITAL_COMMUNITY): Payer: Self-pay | Admitting: Anesthesiology

## 2012-04-28 ENCOUNTER — Encounter (HOSPITAL_COMMUNITY)
Admission: RE | Disposition: A | Discharge status: Home / Self Care | Payer: Self-pay | Source: Ambulatory Visit | Attending: Urology

## 2012-04-28 ENCOUNTER — Observation Stay (HOSPITAL_COMMUNITY)
Admission: RE | Admit: 2012-04-28 | Discharge: 2012-04-29 | Disposition: A | Discharge status: Home / Self Care | Payer: Medicare Other | Source: Ambulatory Visit | Attending: Urology | Admitting: Urology

## 2012-04-28 ENCOUNTER — Encounter (HOSPITAL_COMMUNITY): Payer: Self-pay | Admitting: *Deleted

## 2012-04-28 DIAGNOSIS — N401 Enlarged prostate with lower urinary tract symptoms: Secondary | ICD-10-CM | POA: Insufficient documentation

## 2012-04-28 DIAGNOSIS — Z79899 Other long term (current) drug therapy: Secondary | ICD-10-CM | POA: Insufficient documentation

## 2012-04-28 DIAGNOSIS — R3915 Urgency of urination: Secondary | ICD-10-CM | POA: Insufficient documentation

## 2012-04-28 DIAGNOSIS — Z23 Encounter for immunization: Secondary | ICD-10-CM | POA: Insufficient documentation

## 2012-04-28 DIAGNOSIS — I10 Essential (primary) hypertension: Secondary | ICD-10-CM | POA: Insufficient documentation

## 2012-04-28 DIAGNOSIS — Z01812 Encounter for preprocedural laboratory examination: Secondary | ICD-10-CM | POA: Insufficient documentation

## 2012-04-28 DIAGNOSIS — N139 Obstructive and reflux uropathy, unspecified: Secondary | ICD-10-CM | POA: Insufficient documentation

## 2012-04-28 DIAGNOSIS — N138 Other obstructive and reflux uropathy: Principal | ICD-10-CM | POA: Insufficient documentation

## 2012-04-28 DIAGNOSIS — R338 Other retention of urine: Secondary | ICD-10-CM | POA: Insufficient documentation

## 2012-04-28 DIAGNOSIS — G2581 Restless legs syndrome: Secondary | ICD-10-CM | POA: Insufficient documentation

## 2012-04-28 HISTORY — PX: TRANSURETHRAL PROSTATECTOMY WITH GYRUS INSTRUMENTS: SHX6153

## 2012-04-28 SURGERY — TRANSURETHRAL PROSTATECTOMY WITH GYRUS INSTRUMENTS
Anesthesia: General | Site: Prostate | Wound class: Clean Contaminated

## 2012-04-28 MED ORDER — CEFAZOLIN SODIUM-DEXTROSE 2-3 GM-% IV SOLR: 2.0000 g | INTRAVENOUS | Status: DC

## 2012-04-28 MED ORDER — KETOROLAC TROMETHAMINE 30 MG/ML IJ SOLN: 15.0000 mg | Freq: Once | INTRAMUSCULAR | Status: DC | PRN

## 2012-04-28 MED ORDER — SODIUM CHLORIDE 0.9 % IR SOLN
3000.0000 mL | Status: DC
  Administered 2012-04-28 (×3): 3000 mL via INTRAVESICAL

## 2012-04-28 MED ORDER — LACTATED RINGERS IV SOLN
INTRAVENOUS | Status: DC | PRN
  Administered 2012-04-28 (×2): via INTRAVENOUS

## 2012-04-28 MED ORDER — ZOLPIDEM TARTRATE 5 MG PO TABS: 5.0000 mg | ORAL_TABLET | Freq: Every evening | ORAL | Status: DC | PRN

## 2012-04-28 MED ORDER — BELLADONNA ALKALOIDS-OPIUM 16.2-60 MG RE SUPP
RECTAL | Status: DC | PRN
  Administered 2012-04-28: 1 via RECTAL

## 2012-04-28 MED ORDER — MORPHINE SULFATE 2 MG/ML IJ SOLN: 2.0000 mg | INTRAMUSCULAR | Status: DC | PRN

## 2012-04-28 MED ORDER — PNEUMOCOCCAL VAC POLYVALENT 25 MCG/0.5ML IJ INJ
0.5000 mL | INJECTION | INTRAMUSCULAR | Status: AC
  Administered 2012-04-29: 0.5 mL via INTRAMUSCULAR
  Filled 2012-04-28 (×2): qty 0.5

## 2012-04-28 MED ORDER — ROPINIROLE HCL 1 MG PO TABS
4.0000 mg | ORAL_TABLET | Freq: Every day | ORAL | Status: DC
  Administered 2012-04-28: 4 mg via ORAL
  Filled 2012-04-28 (×2): qty 4

## 2012-04-28 MED ORDER — SULFAMETHOXAZOLE-TRIMETHOPRIM 800-160 MG PO TABS: 1.0000 | ORAL_TABLET | Freq: Two times a day (BID) | ORAL | Status: DC

## 2012-04-28 MED ORDER — GABAPENTIN 600 MG PO TABS
600.0000 mg | ORAL_TABLET | Freq: Every day | ORAL | Status: DC
  Administered 2012-04-28: 600 mg via ORAL
  Filled 2012-04-28 (×2): qty 1

## 2012-04-28 MED ORDER — ADULT MULTIVITAMIN W/MINERALS CH
1.0000 | ORAL_TABLET | Freq: Every day | ORAL | Status: DC
  Administered 2012-04-28 – 2012-04-29 (×2): 1 via ORAL
  Filled 2012-04-28 (×2): qty 1

## 2012-04-28 MED ORDER — BELLADONNA ALKALOIDS-OPIUM 16.2-60 MG RE SUPP
RECTAL | Status: AC
  Filled 2012-04-28: qty 1

## 2012-04-28 MED ORDER — ONDANSETRON HCL 4 MG/2ML IJ SOLN: 4.0000 mg | INTRAMUSCULAR | Status: DC | PRN

## 2012-04-28 MED ORDER — BACITRACIN-NEOMYCIN-POLYMYXIN 400-5-5000 EX OINT: 1.0000 "application " | TOPICAL_OINTMENT | Freq: Three times a day (TID) | CUTANEOUS | Status: DC | PRN

## 2012-04-28 MED ORDER — DOCUSATE SODIUM 100 MG PO CAPS
100.0000 mg | ORAL_CAPSULE | Freq: Two times a day (BID) | ORAL | Status: DC
  Administered 2012-04-28 – 2012-04-29 (×2): 100 mg via ORAL
  Filled 2012-04-28 (×3): qty 1

## 2012-04-28 MED ORDER — ONDANSETRON HCL 4 MG/2ML IJ SOLN
INTRAMUSCULAR | Status: DC | PRN
  Administered 2012-04-28 (×2): 2 mg via INTRAVENOUS

## 2012-04-28 MED ORDER — CEFAZOLIN SODIUM-DEXTROSE 2-3 GM-% IV SOLR
INTRAVENOUS | Status: DC | PRN
  Administered 2012-04-28: 2 g via INTRAVENOUS

## 2012-04-28 MED ORDER — PROPOFOL 10 MG/ML IV BOLUS
INTRAVENOUS | Status: DC | PRN
  Administered 2012-04-28: 30 mg via INTRAVENOUS
  Administered 2012-04-28: 200 mg via INTRAVENOUS

## 2012-04-28 MED ORDER — HYDROCHLOROTHIAZIDE 12.5 MG PO CAPS
12.5000 mg | ORAL_CAPSULE | Freq: Every day | ORAL | Status: DC
  Administered 2012-04-28 – 2012-04-29 (×2): 12.5 mg via ORAL
  Filled 2012-04-28 (×2): qty 1

## 2012-04-28 MED ORDER — PHENYLEPHRINE HCL 10 MG/ML IJ SOLN
10.0000 mg | INTRAVENOUS | Status: DC | PRN
  Administered 2012-04-28: 50 ug/min via INTRAVENOUS

## 2012-04-28 MED ORDER — IRBESARTAN 300 MG PO TABS
300.0000 mg | ORAL_TABLET | Freq: Every day | ORAL | Status: DC
  Administered 2012-04-28 – 2012-04-29 (×2): 300 mg via ORAL
  Filled 2012-04-28 (×2): qty 1

## 2012-04-28 MED ORDER — PHENYLEPHRINE HCL 10 MG/ML IJ SOLN
INTRAMUSCULAR | Status: DC | PRN
  Administered 2012-04-28 (×3): 100 ug via INTRAVENOUS

## 2012-04-28 MED ORDER — FENTANYL CITRATE 0.05 MG/ML IJ SOLN: 25.0000 ug | INTRAMUSCULAR | Status: DC | PRN

## 2012-04-28 MED ORDER — PROMETHAZINE HCL 25 MG/ML IJ SOLN: 6.2500 mg | INTRAMUSCULAR | Status: DC | PRN

## 2012-04-28 MED ORDER — ACETAMINOPHEN 10 MG/ML IV SOLN
INTRAVENOUS | Status: DC | PRN
  Administered 2012-04-28: 1000 mg via INTRAVENOUS

## 2012-04-28 MED ORDER — TAMSULOSIN HCL 0.4 MG PO CAPS
0.4000 mg | ORAL_CAPSULE | Freq: Every day | ORAL | Status: DC
  Administered 2012-04-28: 0.4 mg via ORAL
  Filled 2012-04-28 (×2): qty 1

## 2012-04-28 MED ORDER — SODIUM CHLORIDE 0.9 % IR SOLN
Status: DC | PRN
  Administered 2012-04-28: 3000 mL via INTRAVESICAL

## 2012-04-28 MED ORDER — LIDOCAINE HCL 2 % EX GEL
CUTANEOUS | Status: AC
  Filled 2012-04-28: qty 10

## 2012-04-28 MED ORDER — ACETAMINOPHEN 10 MG/ML IV SOLN
INTRAVENOUS | Status: AC
  Filled 2012-04-28: qty 100

## 2012-04-28 MED ORDER — FENTANYL CITRATE 0.05 MG/ML IJ SOLN
INTRAMUSCULAR | Status: DC | PRN
  Administered 2012-04-28 (×4): 25 ug via INTRAVENOUS

## 2012-04-28 MED ORDER — HYOSCYAMINE SULFATE 0.125 MG SL SUBL
0.1250 mg | SUBLINGUAL_TABLET | SUBLINGUAL | Status: DC | PRN
  Filled 2012-04-28: qty 1

## 2012-04-28 MED ORDER — LACTATED RINGERS IV SOLN
INTRAVENOUS | Status: DC
  Administered 2012-04-28 – 2012-04-29 (×2): via INTRAVENOUS

## 2012-04-28 MED ORDER — LIDOCAINE HCL (CARDIAC) 20 MG/ML IV SOLN
INTRAVENOUS | Status: DC | PRN
  Administered 2012-04-28: 50 mg via INTRAVENOUS

## 2012-04-28 MED ORDER — OLMESARTAN MEDOXOMIL-HCTZ 40-12.5 MG PO TABS: 1.0000 | ORAL_TABLET | Freq: Every morning | ORAL | Status: DC

## 2012-04-28 MED ORDER — CIPROFLOXACIN IN D5W 400 MG/200ML IV SOLN
400.0000 mg | Freq: Two times a day (BID) | INTRAVENOUS | Status: AC
  Administered 2012-04-28: 400 mg via INTRAVENOUS
  Filled 2012-04-28: qty 200

## 2012-04-28 MED ORDER — CEFAZOLIN SODIUM-DEXTROSE 2-3 GM-% IV SOLR
INTRAVENOUS | Status: AC
  Filled 2012-04-28: qty 50

## 2012-04-28 MED ORDER — LACTATED RINGERS IV SOLN
INTRAVENOUS | Status: DC
Start: 2012-04-28 — End: 2012-04-28
  Administered 2012-04-28: 1000 mL via INTRAVENOUS

## 2012-04-28 MED ORDER — INFLUENZA VIRUS VACC SPLIT PF IM SUSP
0.5000 mL | INTRAMUSCULAR | Status: AC
  Administered 2012-04-29: 0.5 mL via INTRAMUSCULAR
  Filled 2012-04-28 (×2): qty 0.5

## 2012-04-28 MED ORDER — OXYCODONE-ACETAMINOPHEN 5-325 MG PO TABS: 1.0000 | ORAL_TABLET | ORAL | Status: DC | PRN

## 2012-04-28 MED ORDER — EPHEDRINE SULFATE 50 MG/ML IJ SOLN
INTRAMUSCULAR | Status: DC | PRN
  Administered 2012-04-28: 10 mg via INTRAVENOUS
  Administered 2012-04-28: 5 mg via INTRAVENOUS
  Administered 2012-04-28: 10 mg via INTRAVENOUS

## 2012-04-28 SURGICAL SUPPLY — 25 items
BAG URINE DRAINAGE (UROLOGICAL SUPPLIES) ×2 IMPLANT
BAG URO CATCHER STRL LF (DRAPE) ×2 IMPLANT
BLADE SURG 15 STRL LF DISP TIS (BLADE) IMPLANT
BLADE SURG 15 STRL SS (BLADE)
CATH FOLEY 3WAY 30CC 22FR (CATHETERS) IMPLANT
CATH FOLEY 3WAY 30CC 24FR (CATHETERS) ×1
CATH URTH STD 24FR FL 3W 2 (CATHETERS) ×1 IMPLANT
CLOTH BEACON ORANGE TIMEOUT ST (SAFETY) ×2 IMPLANT
DRAPE CAMERA CLOSED 9X96 (DRAPES) ×2 IMPLANT
ELECT BUTTON HF 24-28F 2 30DE (ELECTRODE) IMPLANT
ELECT HF RESECT BIPO 24F 45 ND (CUTTING LOOP) IMPLANT
ELECT LOOP MED HF 24F 12D (CUTTING LOOP) IMPLANT
ELECT LOOP MED HF 24F 12D CBL (CLIP) ×2 IMPLANT
EVACUATOR MICROVAS BLADDER (UROLOGICAL SUPPLIES) ×2 IMPLANT
GLOVE BIOGEL M STRL SZ7.5 (GLOVE) ×2 IMPLANT
GOWN STRL NON-REIN LRG LVL3 (GOWN DISPOSABLE) ×2 IMPLANT
GOWN STRL REIN XL XLG (GOWN DISPOSABLE) ×2 IMPLANT
HOLDER FOLEY CATH W/STRAP (MISCELLANEOUS) ×2 IMPLANT
IV NS IRRIG 3000ML ARTHROMATIC (IV SOLUTION) ×18 IMPLANT
KIT ASPIRATION TUBING (SET/KITS/TRAYS/PACK) IMPLANT
MANIFOLD NEPTUNE II (INSTRUMENTS) ×2 IMPLANT
PACK CYSTO (CUSTOM PROCEDURE TRAY) ×2 IMPLANT
SUT ETHILON 3 0 PS 1 (SUTURE) IMPLANT
SYR 30ML LL (SYRINGE) IMPLANT
TUBING CONNECTING 10 (TUBING) ×2 IMPLANT

## 2012-04-28 NOTE — Anesthesia Preprocedure Evaluation (Signed)
Anesthesia Evaluation  Patient identified by MRN, date of birth, ID band Patient awake    Reviewed: Allergy & Precautions, H&P , NPO status , Patient's Chart, lab work & pertinent test results  Airway Mallampati: II TM Distance: <3 FB Neck ROM: Limited    Dental No notable dental hx.    Pulmonary neg pulmonary ROS,  breath sounds clear to auscultation  Pulmonary exam normal       Cardiovascular hypertension, Pt. on medications Rhythm:Regular Rate:Normal     Neuro/Psych negative neurological ROS  negative psych ROS   GI/Hepatic negative GI ROS, Neg liver ROS,   Endo/Other  negative endocrine ROS  Renal/GU Renal InsufficiencyRenal disease  negative genitourinary   Musculoskeletal negative musculoskeletal ROS (+)   Abdominal   Peds negative pediatric ROS (+)  Hematology negative hematology ROS (+)   Anesthesia Other Findings   Reproductive/Obstetrics negative OB ROS                           Anesthesia Physical Anesthesia Plan  ASA: III  Anesthesia Plan: General   Post-op Pain Management:    Induction: Intravenous  Airway Management Planned: LMA  Additional Equipment:   Intra-op Plan:   Post-operative Plan:   Informed Consent: I have reviewed the patients History and Physical, chart, labs and discussed the procedure including the risks, benefits and alternatives for the proposed anesthesia with the patient or authorized representative who has indicated his/her understanding and acceptance.   Dental advisory given  Plan Discussed with: CRNA and Surgeon  Anesthesia Plan Comments:         Anesthesia Quick Evaluation

## 2012-04-28 NOTE — Transfer of Care (Signed)
Immediate Anesthesia Transfer of Care Note  Patient: Brandon Mason  Procedure(s) Performed: Procedure(s) (LRB) with comments: TRANSURETHRAL PROSTATECTOMY WITH GYRUS INSTRUMENTS (N/A)  Patient Location: PACU  Anesthesia Type:General  Level of Consciousness: awake and alert   Airway & Oxygen Therapy: Patient Spontanous Breathing and Patient connected to face mask oxygen  Post-op Assessment: Report given to PACU RN and Post -op Vital signs reviewed and stable  Post vital signs: Reviewed and stable  Complications: No apparent anesthesia complications

## 2012-04-28 NOTE — Anesthesia Postprocedure Evaluation (Signed)
  Anesthesia Post-op Note  Patient: Brandon Mason  Procedure(s) Performed: Procedure(s) (LRB): TRANSURETHRAL PROSTATECTOMY WITH GYRUS INSTRUMENTS (N/A)  Patient Location: PACU  Anesthesia Type: General  Level of Consciousness: awake and alert   Airway and Oxygen Therapy: Patient Spontanous Breathing  Post-op Pain: mild  Post-op Assessment: Post-op Vital signs reviewed, Patient's Cardiovascular Status Stable, Respiratory Function Stable, Patent Airway and No signs of Nausea or vomiting  Post-op Vital Signs: stable  Complications: No apparent anesthesia complications

## 2012-04-28 NOTE — Op Note (Signed)
Preoperative diagnosis: BPH, urinary retention Postoperative diagnoses: BPH urinary retention  Procedure: Exam under anesthesia Transurethral resection of prostate with bipolar gyrus  Surgeon: Mena Goes  Anesthesia: Gen.  Findings:   On exam under anesthesia the penis and testicles were normal without mass or lesion. On digital rectal exam the prostate was enlarged but smooth and had no heart area or nodule.  Cystoscopy: The urethra was normal apart from a tight fossa navicularis was dilated to 28 [place]. The urethra was normal. There was lateral lobe hypertrophy remaining. The prior resection had left an open bladder neck with the start of resection circumferential and adequate median lobe resection, however there were still significant lateral lobe tissue. The bladder neck itself was patent and I did not reresect the bladder neck as it was open. However bilaterally just inside the bladder neck there was significant lateral lobe hypertrophy and significant apical tissue. The trigone and ureteral orifice these were normal and the ureteral orifices were identified pre-and post resection without trauma.  Description of procedure: After consent was obtained patient brought to the operating room. A timeout was performed to confirm the patient and procedure. After adequate anesthesia he is placed in lithotomy position and exam under anesthesia was performed. He then prepped and draped in the usual sterile fashion. Cystoscope was passed per urethra with prior exam findings. The cystoscope was removed and a 25 French resectoscope sheath passed with the visual obturator. The resectoscope and along Looper then passed. With good visualization I resected some apical tissue just at the verumontanum. The there was apical tissue that protruded just past the veru and obstructed the urethra. I was able to cleanly resect some of the tissue from about 9:00 down to 6:00 on the right and 3:00 and 6:00 on the left to  create an open passage with a veru visible in the scope the entire time and was careful not to move more distal. I then went just inside the bladder neck and on the left side resected from anterior to posterior to the mid prostate and and from the mid prostate down to the apex anterior posterior. In a similar fashion a started just inside the bladder neck on the right side resected from anterior to posterior down to the mid prostate. And then from the mid prostate down to the apex anterior to posterior. Again there significant tissue remaining laterally and apically. Resection was well visualized and  I was able to connect the resection to the prior line of resection at the veru. This left a good channel and the scope moved freely. There was some remaining apical tissue that I left in place. Adequate hemostasis was obtained 24 French three-way Foley was placed with the balloon inflated to 30 cc. The balloon was seated at the bladder neck and CBI started. He was awakened and the current in stable condition.  Complications: None  Estimated blood loss: Minimal  Drains: 24 French three-way Foley  Specimens: Prostate chips  Disposition: Patient stable to PACU

## 2012-04-28 NOTE — Interval H&P Note (Signed)
History and Physical Interval Note:  04/28/2012 9:31 AM  [name]  has presented today for surgery, with the diagnosis of BPH (benign prostatic hypertrophy) with urinary obstruction   The various methods of treatment have been discussed with the patient and family. After consideration of risks, benefits and other options for treatment, the patient has consented to  Procedure(s) (LRB) with comments: TRANSURETHRAL PROSTATECTOMY WITH GYRUS INSTRUMENTS (N/A), 2nd STAGE as a surgical intervention .  The patient's history has been reviewed, patient examined, no change in status, stable for surgery.  I have reviewed the patient's chart and labs.  He said he wasn't "leaving the hospital with a catheter". I explained again it takes the bladder and the bladder outlet in harmony to allow voiding/bladder emptying. The goal today is to complete a staged TURP to remove any obstructing tissue. We discussed with surgery, bladder distention, anesthesia, etc. he still may need to go home with the catheter. We discussed careful resection at the apex to avoid post-op incontinence among other risks.  We discussed again side effects of the proposed treatment, the likelihood of the patient achieving the goals of the procedure, and any potential problems that might occur during the procedure or recuperation. Questions were answered to the patient's satisfaction.  He elects to proceed.    [name]

## 2012-04-28 NOTE — Progress Notes (Signed)
Patient ID: Brandon Mason, male   DOB: [birth year], 78 y.o.   MRN: [id number]  Spoke with Nurse Efraim Kaufmann and catheter draining well, urine light pink on CBI.   Pt was on Requip pre-op (not Rapaflo as the Med Rec states). I'll order Requip as he requests.

## 2012-04-29 ENCOUNTER — Encounter (HOSPITAL_COMMUNITY): Payer: Self-pay | Admitting: Urology

## 2012-04-29 NOTE — Discharge Summary (Signed)
Physician Discharge Summary  Patient ID: Brandon Mason MRN: [id number] DOB/AGE: July 23, 1925 78 y.o.  Admit date: 04/28/2012 Discharge date: 04/29/2012  Admission Diagnoses: BPh, urinary retention  Discharge Diagnoses:  BPH   Discharged Condition: good  Hospital Course: Admitted after bipolar TURP. POD#1 foley removed and he has voided several times. Three were racked and these show good volume and light hematuria. No clots. He has voided since then and has a good stream. His urine is clearing.   Consults: None  Significant Diagnostic Studies: none  Treatments: surgery: TURP  Discharge Exam: Blood pressure 103/63, pulse 77, temperature 98.7 F (37.1 C), temperature source Tympanic, resp. rate 16, height 5\' 9"  (1.753 m), weight 76.93 kg (169 lb 9.6 oz), SpO2 96.00%. NAD A&Ox 3 Abd - soft, NT  Disposition: 01-Home or Self Care     Medication List     As of 04/29/2012  1:22 PM    STOP taking these medications         silodosin 8 MG Caps capsule   Commonly known as: RAPAFLO      TAKE these medications         CALCIUM 600 + D PO   Take 1 tablet by mouth daily.      fish oil-omega-3 fatty acids 1000 MG capsule   Take 1 g by mouth daily.      gabapentin 600 MG tablet   Commonly known as: NEURONTIN   Take 600 mg by mouth at bedtime.      multivitamin with minerals Tabs   Take 1 tablet by mouth daily.      olmesartan-hydrochlorothiazide 40-12.5 MG per tablet   Commonly known as: BENICAR HCT   Take 1 tablet by mouth every morning.      oxyCODONE-acetaminophen 5-325 MG per tablet   Commonly known as: PERCOCET/ROXICET   Take 1 tablet by mouth every 4 (four) hours as needed. For pain      sulfamethoxazole-trimethoprim 800-160 MG per tablet   Commonly known as: BACTRIM DS,SEPTRA DS   Take 1 tablet by mouth 2 (two) times daily.      Tamsulosin HCl 0.4 MG Caps   Commonly known as: FLOMAX   Take 0.4 mg by mouth 2 (two) times daily.           Follow-up  Information    Follow up with Mena Goes Lowella Petties, MD. (2 weeks)    Contact information:   [address] [facility] [place] [facility] [place] [address] [phone]          Signed: [name] 04/29/2012, 1:22 PM

## 2012-04-29 NOTE — Progress Notes (Addendum)
Patient ID: Brandon Mason, male   DOB: 1925/10/08, 78 y.o.   MRN: [id number]  Pt without complaints. Reports he had urgency and abd pain last night about 10 pm with decreased catheter drainage. Nurse irrigated a clot out of this catheter and felt relief.   His foley was removed this morning and he hasnt voided. He hasnt been out of bed yet.   I told him there was quite a bit of prostate tissue left and he certainly needed a staged TURP.   Imp - POD#1 TURP -  Plan - Void trial   ADD: I should add he was in NAD, abd- soft, NT, ND. He was in bed with SCD's in place, A&Ox3.

## 2013-02-02 ENCOUNTER — Other Ambulatory Visit: Payer: Self-pay | Admitting: Neurology

## 2013-02-08 ENCOUNTER — Encounter: Payer: Self-pay | Admitting: Neurology

## 2013-02-09 ENCOUNTER — Other Ambulatory Visit: Payer: Self-pay | Admitting: *Deleted

## 2013-02-09 ENCOUNTER — Encounter: Payer: Self-pay | Admitting: Neurology

## 2013-02-09 ENCOUNTER — Ambulatory Visit (INDEPENDENT_AMBULATORY_CARE_PROVIDER_SITE_OTHER): Payer: Medicare Other | Admitting: Neurology

## 2013-02-09 VITALS — BP 166/98 | HR 70 | Resp 17 | Ht 68.0 in | Wt 170.0 lb

## 2013-02-09 DIAGNOSIS — R269 Unspecified abnormalities of gait and mobility: Secondary | ICD-10-CM

## 2013-02-09 DIAGNOSIS — G2581 Restless legs syndrome: Secondary | ICD-10-CM | POA: Insufficient documentation

## 2013-02-09 DIAGNOSIS — R413 Other amnesia: Secondary | ICD-10-CM

## 2013-02-09 DIAGNOSIS — R270 Ataxia, unspecified: Secondary | ICD-10-CM

## 2013-02-09 DIAGNOSIS — R279 Unspecified lack of coordination: Secondary | ICD-10-CM

## 2013-02-09 HISTORY — DX: Unspecified abnormalities of gait and mobility: R26.9

## 2013-02-09 HISTORY — DX: Restless legs syndrome: G25.81

## 2013-02-09 MED ORDER — ROPINIROLE HCL 4 MG PO TABS: 4.0000 mg | ORAL_TABLET | ORAL | Status: DC

## 2013-02-09 MED ORDER — CARBIDOPA-LEVODOPA 25-100 MG PO TABS: 1.0000 | ORAL_TABLET | Freq: Three times a day (TID) | ORAL | Status: DC

## 2013-02-09 NOTE — Patient Instructions (Signed)
Fall Prevention and Home Safety °Falls cause injuries and can affect all age groups. It is possible to prevent falls.  °HOW TO PREVENT FALLS °· Wear shoes with rubber soles that do not have an opening for your toes. °· Keep the inside and outside of your house well lit. °· Use night lights throughout your home. °· Remove clutter from floors. °· Clean up floor spills. °· Remove throw rugs or fasten them to the floor with carpet tape. °· Do not place electrical cords across pathways. °· Put grab bars by your tub, shower, and toilet. Do not use towel bars as grab bars. °· Put handrails on both sides of the stairway. Fix loose handrails. °· Do not climb on stools or stepladders, if possible. °· Do not wax your floors. °· Repair uneven or unsafe sidewalks, walkways, or stairs. °· Keep items you use a lot within reach. °· Be aware of pets. °· Keep emergency numbers next to the telephone. °· Put smoke detectors in your home and near bedrooms. °Ask your doctor what other things you can do to prevent falls. °Document Released: 04/13/2009 Document Revised: 12/17/2011 Document Reviewed: 09/17/2011 °[employer]® Patient Information ©2014 [employer]. ° °

## 2013-02-09 NOTE — Progress Notes (Signed)
[facility]  Provider:  Melvyn Novas, M D  Referring Provider: Barbie Banner, MD Primary Care Physician:  Tarri Fuller, MD  Chief Complaint  Patient presents with  . Follow-up    hypersomnia,rm 10    HPI:  Brandon Mason is a 79 y.o. male  Is seen here as a referral/ revisit  from Dr. Andrey Campanile for hypersomnia follow up in our sleep clinic.  Has been followed in this office for several years but it chief complaint of restless legs. He had the very best success in 2011 20 started for the first time re\re clip in an extended release form. He has also tried quinine, Mirapex and an extended release form but he could only tolerate the brand name, and felt the generic equivalent was not effective. He has a 40 year history now of restless legs and after he originally started on Mirapex he had to change to increasing doses of Requip. The higher doses have left him also more fatigued he often feels groggy in the morning. In 2013 I changed him to release an extended release form of gabapentin as well as maintaining Requip 4 mg XL. I supplied him with samples as a Medicare patient is not able to get the reduced co-pay currents. At the same time the patient stated that he had an increased gait problems balance problem and nocturia between 3 and 5 times at night. He also noted pain with his nocturnal urination which had responded to some TUMS losing. In jaundice thousand 13 I had seen the patient last time before today's visit he had again nocturia actually 6 or 7 bathroom breaks and the night of the sleep study. His speech was severely fragmented he had no apnea of clinical significance but had some low oxygen levels. Today he presents with an Epworth sleepiness score of 22 appliance the highest overseen. The fatigue severity score is 42, and his medication list now includes carbidopa levodopa 25/102 a day multivitamins and still Requip 4 mg gabapentin 600 mg by mouth at bedtime.     Review which is last years  overnight pulse oximetry  09-2011, which showed prolonged periods of desaturation. PSG 11-10-12 , no significant apnea. Nocturia.   Today's review of systems is and the worst for her double vision, shortness of breath snoring feeling hot, fatigued, hearing loss with tinnitus, itching rash and skin sensitivity, dizziness sleepiness restless legs depression rating drops and not enough sleep. He also does memory loss. He is staggering when walking and trembling all over." He underwent prostate surgery and no has no longer a chief complaint of nocturia.    History   Social History  . Marital Status: Married    Spouse Name: N/A    Number of Children: 5  . Years of Education: 12   Occupational History  . 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 Smoking status: Never Smoker   . Smokeless tobacco: Never Used  . Alcohol Use: 1.2 oz/week    2 Cans of beer per week     Comment: 2 cans of beer per week   . Drug Use: No  . Sexually Active: Not on file   Other Topics Concern  . Not on file   Social History Narrative  . No narrative on file    Family History  Problem Relation Age of Onset  . Restless legs syndrome Other     Past Medical History  Diagnosis Date  . Hypertension   . Shortness of breath     with exertion   . Arthritis   . Prostate disorder   . Cardiomegaly   . Chronic kidney disease     BPH  . Neuromuscular disorder     restless leg syndrome   . Hypoxia     pulmonary  . Nocturia   . Hypersomnia   . Chronic leg pain   . RLS (restless legs syndrome)     x 40 years    Past Surgical History  Procedure Laterality Date  . Hernia repair    . Back surgery  2003    L4-5  . Transurethral resection of prostate  03/24/2012    Procedure: TRANSURETHRAL RESECTION OF THE PROSTATE (TURP);  Surgeon: [name], MD;  Location: [facility];  Service: Urology;  Laterality: N/A;  with gyrus ,Greenlight PVP  laser of Prostate  . Transurethral prostatectomy with gyrus instruments  04/28/2012    Procedure: TRANSURETHRAL PROSTATECTOMY WITH GYRUS INSTRUMENTS;  Surgeon: [name], MD;  Location: [facility];  Service: Urology;  Laterality: N/A;  . Cataract extraction      Current Outpatient Prescriptions  Medication Sig Dispense Refill  . Calcium Carbonate-Vitamin D (CALCIUM 600 + D PO) Take 1 tablet by mouth daily.      . carbidopa-levodopa (SINEMET IR) 25-100 MG per tablet Take 1 tablet by mouth 2 (two) times daily.      . Cyanocobalamin (VITAMIN B 12 PO) Take 1,000 mg by mouth daily.      Marland Kitchen gabapentin (NEURONTIN) 600 MG tablet Take 600 mg by mouth at bedtime.      . Gabapentin, PHN, (GRALISE) 600 MG TABS Take by mouth at bedtime.      . Multiple Vitamin (MULTIVITAMIN WITH MINERALS) TABS Take 1 tablet by mouth daily.      . Multiple Vitamin (MULTIVITAMIN) tablet Take 1 tablet by mouth daily.      Marland Kitchen olmesartan-hydrochlorothiazide (BENICAR HCT) 40-12.5 MG per tablet Take 1 tablet by mouth every morning.      Marland Kitchen omeprazole (PRILOSEC) 40 MG capsule Take 40 mg by mouth daily. CPDR      . rOPINIRole (REQUIP) 4 MG tablet TAKE 1/2 TABLET IN THE DAYTIME BY MOUTH AND 1 & 1/2 TABLETS IN THE EVENING BY MOUTH  60 tablet  0  . VITAMIN D, CHOLECALCIFEROL, PO Take 1,000 mg by mouth daily.        No current facility-administered medications for this visit.    Allergies as of 02/09/2013  . (No Known Allergies)    Vitals: BP 166/98  Pulse 70  Resp 17  Ht 5\' 8"  (1.727 m)  Wt 170 lb (77.111 kg)  BMI 25.85 kg/m2 Last Weight:  Wt Readings from Last 1 Encounters:  02/09/13 170 lb (77.111 kg)   Last Height:   Ht Readings from Last 1 Encounters:  02/09/13 5\' 8"  (1.727 m)    Physical exam:  General: The patient is awake, alert and appears not in acute distress. The patient is well groomed. Head: Normocephalic, atraumatic. Neck is supple.  Cardiovascular:  Regular rate and rhythm, without   murmurs or carotid bruit, and without distended neck veins. Respiratory: Lungs are clear to auscultation. Skin:  Without evidence of edema, or rash Trunk: BMI is normal ..  Neurologic exam : The patient is awake and alert, oriented to place and time.  Memory subjective  described as impaired - There is a normal attention span &  concentration ability. Speech is fluent without  dysarthria, dysphonia or aphasia. Mood and affect are appropriate.  Cranial nerves: Pupils are equal and briskly reactive to light. Extraocular movements  in vertical and horizontal planes intact and without nystagmus. The patient has no limitations for upward or downward gaze. Visual fields by finger perimetry are intact. Hearing to finger rub intact.  Facial sensation intact to fine touch. Facial motor strength is symmetric and tongue and uvula move midline.  Motor exam:  Patient is diffuse elevated muscle tone, but nor cogwheeling although breast biceps or triceps. There is no pain with straight straight leg rise. His deep tendon reflexes are preserved , being  neither excessively brisk  nor  attenuated.   Sensory:  Fine touch, pinprick and vibration were tested in all extremities.   Coordination: Rapid alternating movements in the fingers/hands is tested and normal. Finger-to-nose maneuver shows the patient has mild dysmetria, and he performs this task lower. There is no visible tremor. Gait and station: Patient walks without assistive device . Strength within normal limits.  Brandon Mason is able to rise out of a seated position with old bracing himself. He has noticed orthostatic dizziness when first rising in the morning.  Stance is stable wide based . Tandem gait is fragmented. The patient feels as if he has an internnal tremor , a trembling inside.  Romberg testing reveals retropulsion tendency and propulsion. The patient reports that he feels by walking straight he drifts sideways. I noticed him to walk rather  stiffly and with his arms spread away from the body to help him balance. He could turn with 4 steps but he needed to occasionally take an extra corrective step sideways.  Deep tendon reflexes: in the  upper and lower extremities are symmetric, 2 plus  and intact.  Babinski maneuver response is  downgoing.   Assessment:  After physical and neurologic examination, review of laboratory studies, i and pre-existing records, I share the concern about a progressive movement disorder of parkinsonian origin. Patient was at this time extremely hypersomnia and endorsed the at worse sleepiness score at 22 pines has responded well to dopaminergic are good as is and the recently started dopamine supplement. However some of these medications can cause blood pressure irregularities and an increased risk of fainting or dizziness, and hypersomnia. The patient does not have a mild phase he is not dysphonic and he does not have a rehabilitation blink.  I will order an a brain imaging study, the rehabilitation. The patient started on carbidopa-levodopa only 3 weeks ago but states that he has not noted any effects on him. He remained on high-dose Requip. The new combination may have caused more sleepiness. I also noticed that the patient seems to have myoclonic jerks sudden movements of the hall trunk and torso, which may present effects of to  much Dopamin.   Plan:  Treatment plan and additional workup : Requip  at 4 mg ,bid po. Gabapentin at  2 times 600 mg nightly . Sinemet tid , 25-100.

## 2013-02-15 ENCOUNTER — Ambulatory Visit
Admission: RE | Admit: 2013-02-15 | Discharge: 2013-02-15 | Disposition: A | Discharge status: Home / Self Care | Payer: Medicare Other | Source: Ambulatory Visit | Attending: Neurology | Admitting: Neurology

## 2013-02-15 DIAGNOSIS — R413 Other amnesia: Secondary | ICD-10-CM

## 2013-02-15 DIAGNOSIS — G2581 Restless legs syndrome: Secondary | ICD-10-CM

## 2013-02-15 DIAGNOSIS — R269 Unspecified abnormalities of gait and mobility: Secondary | ICD-10-CM

## 2013-02-18 ENCOUNTER — Telehealth: Payer: Self-pay | Admitting: Neurology

## 2013-02-19 ENCOUNTER — Telehealth: Payer: Self-pay | Admitting: Neurology

## 2013-02-22 NOTE — Telephone Encounter (Signed)
Called patient and gave him results to his scan. Patient wants to know what's the next step because his medication is not working for him.

## 2013-02-25 NOTE — Telephone Encounter (Signed)
  Patient with restless legs and tremor .  Meds not working well, sinemet tid po.

## 2013-03-03 ENCOUNTER — Ambulatory Visit: Payer: Medicare Other | Attending: Neurology | Admitting: Physical Therapy

## 2013-03-03 DIAGNOSIS — R269 Unspecified abnormalities of gait and mobility: Secondary | ICD-10-CM | POA: Insufficient documentation

## 2013-03-03 DIAGNOSIS — R41841 Cognitive communication deficit: Secondary | ICD-10-CM | POA: Insufficient documentation

## 2013-03-03 DIAGNOSIS — M6281 Muscle weakness (generalized): Secondary | ICD-10-CM | POA: Insufficient documentation

## 2013-03-03 DIAGNOSIS — Z5189 Encounter for other specified aftercare: Secondary | ICD-10-CM | POA: Insufficient documentation

## 2013-03-08 ENCOUNTER — Ambulatory Visit: Payer: Medicare Other | Admitting: Physical Therapy

## 2013-03-09 NOTE — Telephone Encounter (Signed)
i called this patient after n my return from vacation. He tried again to tolerate higher doses of gabapentin, requip. Not doing well. Please offer a RV with NP or me.

## 2013-03-10 ENCOUNTER — Ambulatory Visit: Payer: Medicare Other | Admitting: Physical Therapy

## 2013-03-15 ENCOUNTER — Ambulatory Visit: Payer: Medicare Other | Admitting: Physical Therapy

## 2013-03-17 ENCOUNTER — Ambulatory Visit: Payer: Medicare Other | Admitting: Physical Therapy

## 2013-03-19 NOTE — Telephone Encounter (Signed)
Duplicate message. 

## 2013-03-22 ENCOUNTER — Ambulatory Visit: Payer: Medicare Other

## 2013-03-24 ENCOUNTER — Ambulatory Visit: Payer: Medicare Other | Admitting: Physical Therapy

## 2013-03-29 ENCOUNTER — Ambulatory Visit: Payer: Medicare Other | Admitting: Physical Therapy

## 2013-03-30 ENCOUNTER — Ambulatory Visit: Payer: Medicare Other

## 2013-03-31 ENCOUNTER — Ambulatory Visit: Payer: Medicare Other | Admitting: Physical Therapy

## 2013-04-02 NOTE — Telephone Encounter (Signed)
Patient has a follow up with Dr. Hosie Poisson on 05-12-13.

## 2013-04-06 ENCOUNTER — Ambulatory Visit: Payer: Medicare Other | Attending: Neurology | Admitting: Speech Pathology

## 2013-04-06 DIAGNOSIS — R269 Unspecified abnormalities of gait and mobility: Secondary | ICD-10-CM | POA: Insufficient documentation

## 2013-04-06 DIAGNOSIS — Z5189 Encounter for other specified aftercare: Secondary | ICD-10-CM | POA: Insufficient documentation

## 2013-04-06 DIAGNOSIS — M6281 Muscle weakness (generalized): Secondary | ICD-10-CM | POA: Insufficient documentation

## 2013-04-06 DIAGNOSIS — R41841 Cognitive communication deficit: Secondary | ICD-10-CM | POA: Insufficient documentation

## 2013-04-13 ENCOUNTER — Encounter: Payer: Medicare Other | Admitting: Speech Pathology

## 2013-04-20 ENCOUNTER — Encounter: Payer: Medicare Other | Admitting: Speech Pathology

## 2013-04-27 ENCOUNTER — Other Ambulatory Visit: Payer: Self-pay | Admitting: Neurology

## 2013-04-27 DIAGNOSIS — R269 Unspecified abnormalities of gait and mobility: Secondary | ICD-10-CM

## 2013-05-12 ENCOUNTER — Ambulatory Visit: Payer: Medicare Other | Admitting: Neurology

## 2013-05-14 ENCOUNTER — Encounter: Payer: Self-pay | Admitting: Neurology

## 2013-05-14 ENCOUNTER — Encounter (INDEPENDENT_AMBULATORY_CARE_PROVIDER_SITE_OTHER): Payer: Self-pay

## 2013-05-14 ENCOUNTER — Ambulatory Visit (INDEPENDENT_AMBULATORY_CARE_PROVIDER_SITE_OTHER): Payer: Medicare Other | Admitting: Neurology

## 2013-05-14 VITALS — BP 164/97 | HR 80 | Ht 67.0 in | Wt 170.0 lb

## 2013-05-14 DIAGNOSIS — R269 Unspecified abnormalities of gait and mobility: Secondary | ICD-10-CM

## 2013-05-14 DIAGNOSIS — R2681 Unsteadiness on feet: Secondary | ICD-10-CM

## 2013-05-14 DIAGNOSIS — G609 Hereditary and idiopathic neuropathy, unspecified: Secondary | ICD-10-CM

## 2013-05-14 MED ORDER — ROTIGOTINE 6 MG/24HR TD PT24: 1.0000 | MEDICATED_PATCH | Freq: Every day | TRANSDERMAL | Status: DC

## 2013-05-14 NOTE — Progress Notes (Signed)
[facility]  Primary Care Physician:  Brandon Fuller, MD  Last visit was around 3 months ago with Dr Brandon Mason, since then has had continued gait instability. Feels like he is unsteady on his feet, veers to the right and left, unsure if he favors right or left. Has had a few falls due to tripping over things. Denies any difficulty lifting his feet off the ground. Notes he is stooping forward when he walks. Denies any slowing down of his walking. Does note some dizziness upon standing. No generalized bradykinesia. He denies any notable external tremor, has sensation of internal tremor. Notes some cramping in his muscles at night. Wife notes occasional episodes of RBD, will yell or act out dreams. Notes having a poor sense of smell.   Currently not taking Sinemet 25-100 TID, did not notice any benefit with this medication so stopped it. Currently taking Reqiup 8mg  daily, feels this makes him too fatigued, can cause him to have sleep attacks, will fall asleep quickly with itNo ICD, no hallucinations. Notes the RLS is well controlled on this medication.   Denies any prior strokes, no focal weakness or sensory changes. Notes some numbness in bilateral lower extremities.   Notes infrequent EtOH usage.    Prior visit 01/2013 Brandon Mason is a 79 y.o. male  Is seen here as a referral/ revisit  from Dr. Watt Climes for evaluation of RLS and cognitive decline. Has been followed in this office for several years but it chief complaint of restless legs. He had the very best success in 2011 20 started for the first time re\re clip in an extended release form. He has also tried quinine, Mirapex and an extended release form but he could only tolerate the brand name, and felt the generic equivalent was not effective. He has a 40 year history now of restless legs and after he originally started on Mirapex he had to change to increasing doses of Requip. The higher doses have left him also more  fatigued he often feels groggy in the morning. In 2013 I changed him to release an extended release form of gabapentin as well as maintaining Requip 4 mg XL. I supplied him with samples as a Medicare patient is not able to get the reduced co-pay currents. At the same time the patient stated that he had an increased gait problems balance problem and nocturia between 3 and 5 times at night. He also noted pain with his nocturnal urination which had responded to some TUMS losing. In jaundice thousand 13 I had seen the patient last time before today's visit he had again nocturia actually 6 or 7 bathroom breaks and the night of the sleep study. His speech was severely fragmented he had no apnea of clinical significance but had some low oxygen levels. Today he presents with an Epworth sleepiness score of 22 appliance the highest overseen. The fatigue severity score is 42, and his medication list now includes carbidopa levodopa 25/102 a day multivitamins and still Requip 4 mg gabapentin 600 mg by mouth at bedtime.  Review which is last years  overnight pulse oximetry  09-2011, which showed prolonged periods of desaturation. PSG 11-10-12 , no significant apnea. Nocturia.   ROS: Other for gait instability  Reviewed imaging of head CT, unremarkable.  History   Social History  . Marital Status: Married    Spouse Name: N/A    Number of Children: 5  . Years of Education: 12   Occupational History  . retired  Manager for a Museum/gallery curator   Social History Main Topics  . Smoking status: Never Smoker   . Smokeless tobacco: Never Used  . Alcohol Use: 1.2 oz/week    2 Cans of beer per week     Comment: 2 cans of beer per week   . Drug Use: No  . Sexual Activity: Not on file   Other Topics Concern  . Not on file   Social History Narrative  . No narrative on file    Family History  Problem Relation Age of Onset  . Restless legs syndrome Other     Past Medical History  Diagnosis Date  .  Hypertension   . Shortness of breath     with exertion   . Arthritis   . Prostate disorder   . Cardiomegaly   . Chronic kidney disease     BPH  . Neuromuscular disorder     restless leg syndrome   . Hypoxia     pulmonary  . Nocturia   . Hypersomnia   . Chronic leg pain   . RLS (restless legs syndrome)     x 40 years  . Abnormality of gait 02/09/2013  . Restless legs syndrome (RLS) 02/09/2013    Past Surgical History  Procedure Laterality Date  . Hernia repair    . Back surgery  2003    L4-5  . Transurethral resection of prostate  03/24/2012    Procedure: TRANSURETHRAL RESECTION OF THE PROSTATE (TURP);  Surgeon: [name], MD;  Location: [facility];  Service: Urology;  Laterality: N/A;  with gyrus ,Greenlight PVP laser of Prostate  . Transurethral prostatectomy with gyrus instruments  04/28/2012    Procedure: TRANSURETHRAL PROSTATECTOMY WITH GYRUS INSTRUMENTS;  Surgeon: [name], MD;  Location: [facility];  Service: Urology;  Laterality: N/A;  . Cataract extraction      Current Outpatient Prescriptions  Medication Sig Dispense Refill  . Cyanocobalamin (VITAMIN B 12 PO) Take 1,000 mg by mouth daily.      Marland Kitchen gabapentin (NEURONTIN) 600 MG tablet Take 600 mg by mouth at bedtime.      . Gabapentin, PHN, (GRALISE) 600 MG TABS Take by mouth at bedtime.      . Multiple Vitamin (MULTIVITAMIN WITH MINERALS) TABS Take 1 tablet by mouth daily.      Marland Kitchen olmesartan-hydrochlorothiazide (BENICAR HCT) 40-12.5 MG per tablet Take 1 tablet by mouth every morning.      Marland Kitchen rOPINIRole (REQUIP) 4 MG tablet Take 1 tablet (4 mg total) by mouth as directed.  180 tablet  3  . VITAMIN D, CHOLECALCIFEROL, PO Take 1,000 mg by mouth daily.        No current facility-administered medications for this visit.    Allergies as of 05/14/2013  . (No Known Allergies)    Vitals: Ht 5\' 7"  (1.702 m)  Wt 170 lb (77.111 kg)  BMI 26.62 kg/m2 Last Weight:  Wt Readings from Last 1 Encounters:   05/14/13 170 lb (77.111 kg)   Last Height:   Ht Readings from Last 1 Encounters:  05/14/13 5\' 7"  (1.702 m)    Physical exam:  General: The patient is awake, alert and appears not in acute distress. The patient is well groomed. Head: Normocephalic, atraumatic. Neck is supple.  Cardiovascular:  Regular rate and rhythm, without  murmurs or carotid bruit, and without distended neck veins. Respiratory: Lungs are clear to auscultation. Skin:  Without evidence of edema, or rash Trunk: BMI is normal ..  Neurologic exam : The patient is awake and alert, oriented to place and time.  Memory: MOCA of 27/30 Speech is fluent without  dysarthria, dysphonia or aphasia. Mood and affect are appropriate.  Cranial nerves: Pupils are equal and briskly reactive to light. Extraocular movements  in vertical and horizontal planes intact and without nystagmus. The patient has no limitations for upward or downward gaze. Visual fields by finger perimetry are intact. Hearing to finger rub intact.  Facial sensation intact to fine touch. Facial motor strength is symmetric and tongue and uvula move midline.  Motor exam: Moves all extremities symmetrically and against light resistance, no increased tone or cogwheeling rigidity noted Mild rest tremor of the left hand noted with distraction, noted intermittent episodes of brief myoclonic jerks involving trunk and upper extremities. Mild postural tremor bilateral upper extremities, no intention or action tremor noted. Minimal bradykinesia of upper extremities with opening closing of hand, rapid alternating movements slower on the right compared to left.   Sensory:  Fine touch, pinprick and vibration were tested in all extremities. Proprioception intact bilateral lower extremity   Gait and station: Patient walks without assistive device . Strength within normal limits.  Mr. Hoeger is able to rise out of a seated position without bracing himself. Notes mild dizziness  upon standing. Stance is stable wide based . Walks at a brisk pace, turns in 3 steps, minimal bearing to the right noted otherwise appear stable. Multiple missteps with tandem walking. With Rhomberg eyes open and stable with eyes closed wobbles but does not fall. With retropulsion pull test takes one step back pain stops. Arms mild extended from body infected double walking gait minimally decreased arm swing on the left, occasional myoclonic jerks of the upper extremity noted during walking   Deep tendon reflexes: in the  upper and lower extremities are symmetric, 2 plus  and intact.  Babinski maneuver response is  downgoing.   Assessment:  After physical and neurologic examination, review of laboratory studies, i and pre-existing records,  1)Gait instability 66)RLS  79 year old gentleman presenting for second opinion of gait instability and restless leg syndrome. Patient's main concern is gait instability with veering to his right or left when walking. Physical exam shows some mild parkinsonian features. He has been on Sinemet in the past with no benefit. Currently taking Requip 8 mg daily for his restless leg syndrome. Notes good benefit from this but is having severe fatigue and sleep attacks. Unclear etiology of patient's symptoms, the patient does have parkinsonian features and this may represent an atypical parkinsonism. As he does not appear to be responsive to Sinemet I have lower suspicion this represents idiopathic Parkinson's disease. Will check brain MRI for possible structural causes of his instability, will check lab work for other possible etiologies. With history of sleep attacks will try to decrease dopamine agonist dose and switch patient to Neupro patch 6 mg. Patient given supplies for one month trial.    -Discontinue Requip 8mg  daily -Start Neupro 6mg  patch daily with hope that lower dose/more continuous release will decrease fatigue/sleep attacks -check brain MRI and peripheral  neuropathy workup for causes of gait instability -follow up once MRI completed, patient to follow up with Dr. Dennie Mason

## 2013-05-14 NOTE — Patient Instructions (Signed)
Overall you are doing fairly well but I do want to suggest a few things today:   Remember to drink plenty of fluid, eat healthy meals and do not skip any meals. Try to eat protein with a every meal and eat a healthy snack such as fruit or nuts in between meals. Try to keep a regular sleep-wake schedule and try to exercise daily, particularly in the form of walking, 20-30 minutes a day, if you can.   As far as your medications are concerned, I would like to suggest the following: -Discontinue the Requip -Start taking Neupro patch 6mg  daily  As far as diagnostic testing:  1)MRI brain 2)Lab work for causes of a peripheral neuropathy  Please schedule a follow up with Dr Larwance Sachs, sooner if we need to. Please call us with any interim questions, concerns, problems, updates or refill requests.   Please also call us for any test results so we can go over those with you on the phone.  My clinical assistant and will answer any of your questions and relay your messages to me and also relay most of my messages to you.   Our phone number is [phone]. We also have an after hours call service for urgent matters and there is a physician on-call for urgent questions. For any emergencies you know to call 911 or go to the nearest emergency room

## 2013-05-18 ENCOUNTER — Telehealth: Payer: Self-pay | Admitting: Neurology

## 2013-05-18 DIAGNOSIS — R269 Unspecified abnormalities of gait and mobility: Secondary | ICD-10-CM

## 2013-05-18 DIAGNOSIS — R413 Other amnesia: Secondary | ICD-10-CM

## 2013-05-18 DIAGNOSIS — G2581 Restless legs syndrome: Secondary | ICD-10-CM

## 2013-05-18 NOTE — Telephone Encounter (Addendum)
Patient said that he cannot get the patch to stay on without taping but it is still  not helping,would like to go back to his ropinirole prescription.

## 2013-05-19 LAB — PROTEIN ELECTROPHORESIS
Albumin ELP: 4 g/dL (ref 3.2–5.6)
Alpha 1: 0.2 g/dL (ref 0.1–0.4)
Alpha 2: 0.6 g/dL (ref 0.4–1.2)
Beta: 0.8 g/dL (ref 0.6–1.3)
Total Protein: 6.9 g/dL (ref 6.0–8.5)

## 2013-05-20 ENCOUNTER — Telehealth: Payer: Self-pay | Admitting: Neurology

## 2013-05-20 MED ORDER — ROPINIROLE HCL 4 MG PO TABS: 4.0000 mg | ORAL_TABLET | ORAL | Status: DC

## 2013-05-20 NOTE — Telephone Encounter (Signed)
Called patient to discuss lab results. Results were sent to PCP, patient will follow up with PCP for further workup of abnormal TSH and HbA1c as indicated.

## 2013-05-20 NOTE — Telephone Encounter (Signed)
That is fine to go back to the ropinirole. He will be following up with Dr Vickey Huger in the future so I would get her approval.

## 2013-05-24 ENCOUNTER — Ambulatory Visit
Admission: RE | Admit: 2013-05-24 | Discharge: 2013-05-24 | Disposition: A | Discharge status: Home / Self Care | Payer: Medicare Other | Source: Ambulatory Visit | Attending: Neurology | Admitting: Neurology

## 2013-05-24 DIAGNOSIS — R269 Unspecified abnormalities of gait and mobility: Secondary | ICD-10-CM

## 2013-05-24 DIAGNOSIS — R2681 Unsteadiness on feet: Secondary | ICD-10-CM

## 2013-05-26 ENCOUNTER — Telehealth: Payer: Self-pay | Admitting: *Deleted

## 2013-05-26 NOTE — Telephone Encounter (Signed)
Informed that Dr Hosie Poisson is out of the office today, will send message to him to call concerning letter he received, patient verbalized understanding and wants MRI results.

## 2013-05-26 NOTE — Telephone Encounter (Signed)
Spoke to patient and relayed the abnormal TSH and A1c results, also MRI result.  He still requested that the doctor call him to go into more detail.

## 2013-05-31 NOTE — Telephone Encounter (Signed)
Patient called back and results were discussed. All questions were fully answered. Patient instructed to follow up with Dr. Sunday Shams who is her regular neurologist.

## 2013-06-08 ENCOUNTER — Other Ambulatory Visit: Payer: Self-pay | Admitting: Neurology

## 2013-06-29 NOTE — Telephone Encounter (Signed)
Noted  

## 2013-08-17 ENCOUNTER — Telehealth: Payer: Self-pay | Admitting: Neurology

## 2013-08-17 NOTE — Telephone Encounter (Signed)
Patient requesting sooner appointment than 3/26 with Dr. Brett Fairy because he has noticed that he is having restless legs during the daytime and not just at night time, for the past week now. Please call patient to schedule.

## 2013-08-18 NOTE — Telephone Encounter (Signed)
Patient is requesting an earlier appt than 09/23/13. Patient states that he is having restless legs all the time now for the past week. Please advise.

## 2013-08-20 ENCOUNTER — Ambulatory Visit (INDEPENDENT_AMBULATORY_CARE_PROVIDER_SITE_OTHER): Payer: Medicare Other | Admitting: Neurology

## 2013-08-20 ENCOUNTER — Encounter (INDEPENDENT_AMBULATORY_CARE_PROVIDER_SITE_OTHER): Payer: Self-pay

## 2013-08-20 ENCOUNTER — Encounter: Payer: Self-pay | Admitting: Neurology

## 2013-08-20 VITALS — BP 164/97 | HR 81 | Resp 16 | Ht 68.0 in | Wt 176.0 lb

## 2013-08-20 DIAGNOSIS — G2581 Restless legs syndrome: Secondary | ICD-10-CM | POA: Insufficient documentation

## 2013-08-20 DIAGNOSIS — G253 Myoclonus: Secondary | ICD-10-CM

## 2013-08-20 DIAGNOSIS — D509 Iron deficiency anemia, unspecified: Secondary | ICD-10-CM

## 2013-08-20 MED ORDER — GABAPENTIN 600 MG PO TABS: 600.0000 mg | ORAL_TABLET | Freq: Two times a day (BID) | ORAL | Status: DC

## 2013-08-20 MED ORDER — ROPINIROLE HCL 4 MG PO TABS: 2.0000 mg | ORAL_TABLET | ORAL | Status: DC

## 2013-08-20 MED ORDER — TRAMADOL HCL 50 MG PO TABS: 50.0000 mg | ORAL_TABLET | Freq: Every evening | ORAL | Status: DC

## 2013-08-20 MED ORDER — ROPINIROLE HCL 4 MG PO TABS: 4.0000 mg | ORAL_TABLET | ORAL | Status: DC

## 2013-08-20 NOTE — Patient Instructions (Signed)
  We discussed today the anticipation  of restless leg syndrome with onset earlier in the day , while  being treated with a dopaminergic agonist ( requip) . you also reported that you have  been eating more -  more compulsively-Restless Legs Syndrome Restless legs syndrome is a movement disorder. It may also be called a sensori-motor disorder.  CAUSES  No one knows what specifically causes restless legs syndrome, but it tends to run in families. It is also more common in people with low iron, in pregnancy, in people who need dialysis, and those with nerve damage (neuropathy).Some medications may make restless legs syndrome worse.Those medications include drugs to treat high blood pressure, some heart conditions, nausea, colds, allergies, and depression. SYMPTOMS Symptoms include uncomfortable sensations in the legs. These leg sensations are worse during periods of inactivity or rest. They are also worse while sitting or lying down. Individuals that have the disorder describe sensations in the legs that feel like:  Pulling.  Drawing.  Crawling.  Worming.  Boring.  Tingling.  Pins and needles.  Prickling.  Pain. The sensations are usually accompanied by an overwhelming urge to move the legs. Sudden muscle jerks may also occur. Movement provides temporary relief from the discomfort. In rare cases, the arms may also be affected. Symptoms may interfere with going to sleep (sleep onset insomnia). Restless legs syndrome may also be related to periodic limb movement disorder (PLMD). PLMD is another more common motor disorder. It also causes interrupted sleep. The symptoms from PLMD usually occur most often when you are awake. TREATMENT  Treatment for restless legs syndrome is symptomatic. This means that the symptoms are treated.   Massage and cold compresses may provide temporary relief.  Walk, stretch, or take a cold or hot bath.  Get regular exercise and a good night's sleep.  Avoid  caffeine, alcohol, nicotine, and medications that can make it worse.  Do activities that provide mental stimulation like discussions, needlework, and video games. These may be helpful if you are not able to walk or stretch. Some medications are effective in relieving the symptoms. However, many of these medications have side effects. Ask your caregiver about medications that may help your symptoms. Correcting iron deficiency may improve symptoms for some patients. Document Released: 06/07/2002 Document Revised: 09/09/2011 Document Reviewed: 09/13/2010 [facility] Patient Information 2014 [place].  and this may be a side effect of this medication.  I would reduce Requip from 4 mg twice a day and ask you to try to take 2 mg twice a day with the option of taking the second half of the tablets as needed.  Requip also can produce "microsleep attacks" , which you may have experienced. Gabapentin also causes sleepiness side effects.   In order to allow a reduction in Requip I would ask you to take tramadol at night which should not interfere with  daytime alertness or activity,  but hopefully will allow you 6-7 hours of restful sleep.  Your MRI did not show any focal brain atrophy changes that would not be related to your age. The memory  evaluation was excellent your score 29 of 30 and a cognitive assessment test.

## 2013-08-20 NOTE — Progress Notes (Signed)
[facility]   Primary Care Physician:  Rubie Maid, MD         Interval history per Dr Janann Colonel: Visit Brandon Mason returns today with a complaint that his restless legs with myoclonic jerks have begun to affect his daytime quality of life. He also reports that with the current medication he is able to fall asleep and but not able to stay asleep. Last visit was around 3 months ago with Dr Brett Fairy, since then has had continued gait instability. Feels like he is unsteady on his feet, veers to the right and left, unsure if he favors right or left. Has had a few falls due to tripping over things. Denies any difficulty lifting his feet off the ground. Notes he is stooping forward when he walks. Denies any slowing down of his walking. Does note some dizziness upon standing. No generalized bradykinesia. He denies any notable external tremor, has sensation of internal tremor. Notes some cramping in his muscles at night. Wife notes occasional episodes of RBD, will yell or act out dreams. Notes having a poor sense of smell.  Currently not taking Sinemet 25-100 TID, did not notice any benefit with this medication so stopped it. Currently taking Reqiup 8mg  daily, feels this makes him too fatigued, can cause him to have sleep attacks, will fall asleep quickly with itNo ICD, no hallucinations. Notes the RLS is well controlled on this medication.   Denies any prior strokes, no focal weakness or sensory changes. Notes some numbness in bilateral lower extremities.   Notes infrequent EtOH usage.     Brandon Mason is a 79 y.o. male  Is seen here as a referral/ revisit  from Dr. Bebe Shaggy for evaluation of RLS and subjective report of cognitive decline. Has been followed in this office for several years but it chief complaint of restless legs.  He had the very best success in 2011 , 2012 22 started for the first time on requip  in an extended release form. He has also tried  quinine, Mirapex and an extended release form but he could only tolerate the brand name ( Mirapex) , and felt the generic equivalent was not effective.  He has a 40 year history now of restless legs and after he originally started on Mirapex he had to change to increasing doses of Requip. The higher doses have left him also more fatigued , thus he often feels groggy in the morning.  In 2013 he changed to  an extended release form of gabapentin as well as maintaining Requip 4 mg XL.  I supplied him with samples as a Medicare patient is not able to get the reduced co-pay currents. At the same time the patient stated that he had an increased gait problems balance problem and nocturia between 3 and 5 times at night. He also noted pain with his nocturnal urination . In January 2013 I had seen the patient and  he had again nocturia actually 6 or 7 bathroom breaks, as seen  during  the night of the sleep study. His speech was severely fragmented he had no apnea of clinical significance but had some low oxygen levels.  Marland Kitchen PSG 11-10-12 , no significant apnea. Nocturia.   Marland Kitchendate The daytime quality of life has been more affected eyelid restless legs leg pain and sudden myoclonic jerks. The patient reminded me of her strong Cherly Anderson family history of restless legs. This subjective feeling of cognitive impairment has not been objectified - he scored  on the  [facility] cognitive assessment test today- 08-20-13 @ 29 of 30 points. Underwent an MRI of the brain which Dr. Janann Colonel ordered for him after his November visit with the patient this returned with normal for age changes some chronic, microvascular based ischemia and generalized cortical atrophy not disproportional for age. He endorsed the restless leg question here today as somewhat satisfied with asleep during the last 7 nights mild trouble with falling asleep initially at night but very severe impairment in sitting or lying being at rest and daytime. If he is able to  walk or do any physical activity and  he is not bothered by the restless legs,  but whenever he is at rest , he has noted the restless legs to affect him.  He did not endorse the geriatric depression score. The Epworth sleepiness score  Nor the fatigue severity score today.    Wants  to see if we can find a way to allow him to sleep better through the night as his wife has also noticed that he is repeatedly jerking with both legs,  sometimes even his upper extremities,  and this  seems to have progressed from about a year ago.  He feels "inside my whole body is shaking" .  ROS: Other for gait instability  Reviewed imaging of head MRI from 11-24 -14 , unremarkable. Review  of systems today : he endorsed some shortness of breath hearing loss tinnitus, but also excessive eating, at times speech difficulties, dizziness joint pain sleep talking and daytime sleepiness.     History   Social History  . Marital Status: Married    Spouse Name: [name]    Number of Children: 5  . Years of Education: 12   Occupational History  . retired      [name] for a [employer]  . Smoking status: Never Smoker   . Smokeless tobacco: Never Used  . Alcohol Use: 1.2 oz/week    2 Cans of beer per week     Comment: 2 cans of beer per week   . Drug Use: No  . Sexual Activity: Not on file   Other Topics Concern  . Not on file   Social History Narrative   Patient lives at home with his wife [name].   Patient is retired.    Patient has 5 children.   Patient has a high school education.   Patient is right-handed.   Patient drinks very little caffeine.    Family History  Problem Relation Age of Onset  . Restless legs syndrome Mother   . Restless legs syndrome Daughter     Past Medical History  Diagnosis Date  . Hypertension   . Shortness of breath     with exertion   . Arthritis   . Prostate disorder   . Cardiomegaly   . Chronic kidney disease     BPH  .  Neuromuscular disorder     restless leg syndrome   . Hypoxia     pulmonary  . Nocturia   . Hypersomnia   . Chronic leg pain   . RLS (restless legs syndrome)     x 40 years  . Abnormality of gait 02/09/2013  . Restless legs syndrome (RLS) 02/09/2013  . Restless legs syndrome with nocturnal myoclonus     Past Surgical History  Procedure Laterality Date  . Hernia repair    . Back surgery  2003    L4-5  .  Transurethral resection of prostate  03/24/2012    Procedure: TRANSURETHRAL RESECTION OF THE PROSTATE (TURP);  Surgeon: Fredricka Bonine, MD;  Location: [facility];  Service: Urology;  Laterality: N/A;  with gyrus ,Greenlight PVP laser of Prostate  . Transurethral prostatectomy with gyrus instruments  04/28/2012    Procedure: TRANSURETHRAL PROSTATECTOMY WITH GYRUS INSTRUMENTS;  Surgeon: Fredricka Bonine, MD;  Location: [facility];  Service: Urology;  Laterality: N/A;  . Cataract extraction      Current Outpatient Prescriptions  Medication Sig Dispense Refill  . Cyanocobalamin (VITAMIN B 12 PO) Take 1,000 mg by mouth daily.      Marland Kitchen gabapentin (NEURONTIN) 600 MG tablet TAKE 2 TABLETS AT BEDTIME  180 tablet  2  . Gabapentin, PHN, (GRALISE) 600 MG TABS Take by mouth at bedtime.      . Multiple Vitamin (MULTIVITAMIN WITH MINERALS) TABS Take 1 tablet by mouth daily.      Marland Kitchen rOPINIRole (REQUIP) 4 MG tablet Take 4 mg by mouth as directed. Taking 2 tablets      . VITAMIN D, CHOLECALCIFEROL, PO Take 1,000 mg by mouth daily.        No current facility-administered medications for this visit.    Allergies as of 08/20/2013  . (No Known Allergies)    Vitals: BP 164/97  Pulse 81  Resp 16  Ht 5\' 8"  (1.727 m)  Wt 176 lb (79.833 kg)  BMI 26.77 kg/m2 Last Weight:  Wt Readings from Last 1 Encounters:  08/20/13 176 lb (79.833 kg)   Last Height:   Ht Readings from Last 1 Encounters:  08/20/13 5\' 8"  (1.727 m)    Physical exam:  General: The patient is awake, alert and appears not  in acute distress. The patient is well groomed. Head: Normocephalic, atraumatic. Neck is supple.  Cardiovascular:  Regular rate and rhythm, without  murmurs or carotid bruit, and without distended neck veins. Respiratory: Lungs are clear to auscultation. Skin:  Without evidence of edema, or rash Trunk: BMI is normal ..  Neurologic exam : The patient is awake and alert, oriented to place and time.  Memory: MOCA of 29/30 Speech is fluent without  dysarthria, dysphonia or aphasia. Mood and affect are appropriate.  Cranial nerves: Pupils are equal and briskly reactive to light. Extraocular movements  in vertical and horizontal planes intact and without nystagmus. The patient has no limitations for upward or downward gaze. Visual fields by finger perimetry are intact. Hearing to finger rub intact.  Facial sensation intact to fine touch. Facial motor strength is symmetric and tongue and uvula move midline.  Motor exam: Moves all extremities symmetrically and against light resistance, no increased tone or cogwheeling rigidity noted Mild rest tremor of the left hand noted with distraction, noted intermittent episodes of brief myoclonic jerks involving trunk and upper extremities. Mild postural tremor bilateral upper extremities, no intention or action tremor noted. Minimal bradykinesia of upper extremities with opening closing of hand, rapid alternating movements slower on the right compared to left.   Sensory:  Fine touch, pinprick and vibration were tested in all extremities. Proprioception intact bilateral lower extremity   Gait and station: Patient walks without assistive device . Strength within normal limits.  Mr. Weaver is able to rise out of a seated position without bracing himself. Notes mild dizziness upon standing. Stance is stable wide based .  Walks at a brisk pace, turns in 3 steps, minimal bearing to the right noted otherwise appear stable. Multiple missteps with tandem walking.  With  Rhomberg eyes open and stable with eyes closed - wobbles but does not fall.  With retropulsion pull test takes one step back pain stops.  Arms mild extended from body infected double walking gait minimally decreased arm swing on the left, occasional myoclonic jerks of the upper extremity noted during walking   Deep tendon reflexes: in the  upper and lower extremities are symmetric, 2 plus.  Babinski maneuver response is  downgoing.   Assessment:  After physical and neurologic examination, review of laboratory studies,Images and records:   1)Gait instability 56)RLS  80 year old gentleman presenting for second opinion of gait instability and restless leg syndrome. Patient's main concern is gait instability with veering to his right or left when walking. Physical exam shows some mild parkinsonian features. He has been on Sinemet in the past with no benefit. Currently taking Requip 8 mg daily for his restless leg syndrome. Notes good benefit from this,  but is having severe fatigue and sleep attacks. He  noted that he is compulsively  eating .Unclear etiology of patient's symptoms, the patient does have parkinsonian features and this may represent an atypical parkinsonism. With history of sleep attacks will try to decrease dopamine agonist dose  And add a narcotic for night time use. A switch  to Neupro patch 6 mg was aborted after 7 days, since the patient felt jumpy and restless.  Patient given supplies for one month trial.    -Continue Requip 4mg  daily, divided into 2 doses at 4 and 8 PM. This is a reduction by 50 % in dose , hopefully helps with compulsive eating.  -Continue neurontin at single night time dose.  Add Tramadol for nighttime use only, 50 mg.   TIBC and ferritin have been checked in the past. Patient takes a multivitamin B complex, Vit D .

## 2013-08-21 LAB — IRON AND TIBC
IRON SATURATION: 49 % (ref 15–55)
Iron: 147 ug/dL (ref 40–155)
TIBC: 302 ug/dL (ref 250–450)
UIBC: 155 ug/dL (ref 150–375)

## 2013-08-21 LAB — FERRITIN: FERRITIN: 213 ng/mL (ref 30–400)

## 2013-08-30 NOTE — Progress Notes (Signed)
Quick Note:  Shared normal iron saturation per Dr Dohmeier's findings, he verbalized understanding ______

## 2013-09-23 ENCOUNTER — Ambulatory Visit: Payer: Medicare Other | Admitting: Neurology

## 2013-09-23 ENCOUNTER — Other Ambulatory Visit: Payer: Self-pay | Admitting: Neurology

## 2013-09-24 NOTE — Telephone Encounter (Signed)
Rx signed and faxed.

## 2013-11-26 ENCOUNTER — Telehealth: Payer: Self-pay | Admitting: Neurology

## 2013-11-26 NOTE — Telephone Encounter (Signed)
Can he bee seen here with Dr Leonie Man for stroke follow up .

## 2013-11-26 NOTE — Telephone Encounter (Signed)
Spoke to patient to clarify information.  Upon his discharge from [facility] it was recommended that he follow up with a neurologist in [place].  He does have that appointment scheduled and will keep that follow up.  He will continue to see Dr. Brett Fairy for his RLS.  No further contact for a follow up for stroke needs to be made.

## 2013-11-26 NOTE — Telephone Encounter (Signed)
FYI

## 2013-11-26 NOTE — Telephone Encounter (Signed)
This would be a new patient w/ Dr. Leonie Man....right?

## 2013-11-26 NOTE — Telephone Encounter (Signed)
Spoke with patient and he said that he was seen at [facility] for a mini stroke and they have scheduled him for a f/u with a Neurologist in the area in [place] be f/u with his PCP today as well.

## 2013-11-26 NOTE — Telephone Encounter (Signed)
Patient called to state that he was recently discharged from the hospital on  May 20th for mini stroke and is requesting an earlier appointment with a neurologist.  Please call to advise.

## 2013-12-27 ENCOUNTER — Telehealth: Payer: Self-pay | Admitting: Neurology

## 2013-12-27 NOTE — Telephone Encounter (Signed)
I called the patient back.  He says his current dose of Requip is helping with RLS, but he is very drowsy all the time.  Says he is not sure if meds (specifically mentioned Requip, Gabapentin and Tramadol) should be adjusted, or if there are any recommendations the Andri Prestia can make to aide with drowsiness.  Please advise.  Thank you.

## 2013-12-27 NOTE — Telephone Encounter (Signed)
Please make a RV appointment for [facility] with him in the m next 3-4 weeks.  I am sure the RLS meds make him sleepy, the only reduction would be an XR form , which may not be covered by medicare.

## 2013-12-27 NOTE — Telephone Encounter (Signed)
Patient says Requip, Gabapentin and Tramadol needs to be changed--having side effects--cannot stay awake--falls asleep when someone is talking to him--please call patient--[facility].

## 2013-12-27 NOTE — Telephone Encounter (Signed)
I called the patient back.  Relayed Dr Dohmeier's message.  He verbalized understanding and will be waiting for assistant to call him to schedule an appt.

## 2013-12-28 NOTE — Telephone Encounter (Signed)
Patient was scheduled an appointment with [facility] and the patient wanted the appointment as soon as possible.

## 2013-12-29 ENCOUNTER — Ambulatory Visit (INDEPENDENT_AMBULATORY_CARE_PROVIDER_SITE_OTHER): Payer: Medicare Other | Admitting: Adult Health

## 2013-12-29 ENCOUNTER — Encounter (INDEPENDENT_AMBULATORY_CARE_PROVIDER_SITE_OTHER): Payer: Self-pay

## 2013-12-29 ENCOUNTER — Encounter: Payer: Self-pay | Admitting: Adult Health

## 2013-12-29 VITALS — BP 138/82 | HR 72 | Temp 97.0°F | Ht 68.0 in | Wt 169.0 lb

## 2013-12-29 DIAGNOSIS — G2581 Restless legs syndrome: Secondary | ICD-10-CM

## 2013-12-29 DIAGNOSIS — R269 Unspecified abnormalities of gait and mobility: Secondary | ICD-10-CM

## 2013-12-29 MED ORDER — ROPINIROLE HCL 1 MG PO TABS: ORAL_TABLET | ORAL | Status: DC

## 2013-12-29 MED ORDER — GABAPENTIN 600 MG PO TABS: 600.0000 mg | ORAL_TABLET | Freq: Every day | ORAL | Status: DC

## 2013-12-29 NOTE — Progress Notes (Signed)
I agree with the assessment and plan as directed by NP .The patient is known to me .   Yu Cragun, MD  

## 2013-12-29 NOTE — Patient Instructions (Signed)
Restless Legs Syndrome Restless legs syndrome is a movement disorder. It may also be called a sensori-motor disorder.  CAUSES  No one knows what specifically causes restless legs syndrome, but it tends to run in families. It is also more common in people with low iron, in pregnancy, in people who need dialysis, and those with nerve damage (neuropathy).Some medications may make restless legs syndrome worse.Those medications include drugs to treat high blood pressure, some heart conditions, nausea, colds, allergies, and depression. SYMPTOMS Symptoms include uncomfortable sensations in the legs. These leg sensations are worse during periods of inactivity or rest. They are also worse while sitting or lying down. Individuals that have the disorder describe sensations in the legs that feel like:  Pulling.  Drawing.  Crawling.  Worming.  Boring.  Tingling.  Pins and needles.  Prickling.  Pain. The sensations are usually accompanied by an overwhelming urge to move the legs. Sudden muscle jerks may also occur. Movement provides temporary relief from the discomfort. In rare cases, the arms may also be affected. Symptoms may interfere with going to sleep (sleep onset insomnia). Restless legs syndrome may also be related to periodic limb movement disorder (PLMD). PLMD is another more common motor disorder. It also causes interrupted sleep. The symptoms from PLMD usually occur most often when you are awake. TREATMENT  Treatment for restless legs syndrome is symptomatic. This means that the symptoms are treated.   Massage and cold compresses may provide temporary relief.  Walk, stretch, or take a cold or hot bath.  Get regular exercise and a good night's sleep.  Avoid caffeine, alcohol, nicotine, and medications that can make it worse.  Do activities that provide mental stimulation like discussions, needlework, and video games. These may be helpful if you are not able to walk or  stretch. Some medications are effective in relieving the symptoms. However, many of these medications have side effects. Ask your caregiver about medications that may help your symptoms. Correcting iron deficiency may improve symptoms for some patients. Document Released: 06/07/2002 Document Revised: 09/09/2011 Document Reviewed: 09/13/2010 [facility] Patient Information 2015 [place], [place]. This information is not intended to replace advice given to you by your health care provider. Make sure you discuss any questions you have with your health care provider.

## 2013-12-29 NOTE — Progress Notes (Signed)
PATIENT: Brandon Mason DOB: 06/13/1926  REASON FOR VISIT: follow up HISTORY FROM: patient  HISTORY OF PRESENT ILLNESS: Brandon Mason is an 80 year old male with a history of restless leg syndrome and gait instability. He returns today for follow up. He currently takes requip, Neurontin and tramadol for restless legs. He reports that the Requip works well for the restless legs but it makes him really drowsy during the day. He states that he fell asleep while they had quest over at their house and reports that it was very embarrassing. Patient takes tramadol and gabapentin only at night before bedtime. Since reducing his requip does his compulsive eating has improved some. Patient reports that his gait has improved, he was using a walker but now he only uses a cane. Patient reports that he had a "mini-stroke" 6 weeks ago. States that his initial symptoms were slurred speech and left sided weakness. His symptoms did resolve and he was placed on an aspirin. He is currently doing therapy at [facility]. There is no history of this in the EPIC system.   REVIEW OF SYSTEMS: Full 14 system review of systems performed and notable only for:  Constitutional: Fatigue  Eyes: Double vision, blurred vision Ear/Nose/Throat: Hearing loss, ringing ear  Skin: N/A  Cardiovascular: N/A  Respiratory: Shortness of breath Gastrointestinal: N/A  Genitourinary: N/A Hematology/Lymphatic: N/A  Endocrine: Excessive eating Musculoskeletal: Joint pain, muscle cramps, walking difficulty, neck stiffness Allergy/Immunology: N/A  Neurological: Nervous/anxious Psychiatric: N/A Sleep: Restless leg, daytime sleepiness, snoring   ALLERGIES: No Known Allergies  HOME MEDICATIONS: Outpatient Prescriptions Prior to Visit  Medication Sig Dispense Refill  . Cyanocobalamin (VITAMIN B 12 PO) Take 1,000 mg by mouth daily.      Marland Kitchen gabapentin (NEURONTIN) 600 MG tablet Take 1 tablet (600 mg total) by mouth 2 (two) times daily. One at  bedtime. One at night prn ( usually 3 am)  180 tablet  2  . Multiple Vitamin (MULTIVITAMIN WITH MINERALS) TABS Take 1 tablet by mouth daily.      Marland Kitchen rOPINIRole (REQUIP) 4 MG tablet Take 0.5 tablets (2 mg total) by mouth as directed. Taking 2 tablets  270 tablet  3  . traMADol (ULTRAM) 50 MG tablet TAKE 1 TABLET NIGHTLY  30 tablet  3  . VITAMIN D, CHOLECALCIFEROL, PO Take 1,000 mg by mouth daily.        No facility-administered medications prior to visit.    PAST MEDICAL HISTORY: Past Medical History  Diagnosis Date  . Hypertension   . Shortness of breath     with exertion   . Arthritis   . Prostate disorder   . Cardiomegaly   . Chronic kidney disease     BPH  . Neuromuscular disorder     restless leg syndrome   . Hypoxia     pulmonary  . Nocturia   . Hypersomnia   . Chronic leg pain   . RLS (restless legs syndrome)     x 40 years  . Abnormality of gait 02/09/2013  . Restless legs syndrome (RLS) 02/09/2013  . Restless legs syndrome with nocturnal myoclonus     PAST SURGICAL HISTORY: Past Surgical History  Procedure Laterality Date  . Hernia repair    . Back surgery  2003    L4-5  . Transurethral resection of prostate  03/24/2012    Procedure: TRANSURETHRAL RESECTION OF THE PROSTATE (TURP);  Surgeon: Fredricka Bonine, MD;  Location: [facility];  Service: Urology;  Laterality: N/A;  with gyrus ,Greenlight PVP laser of Prostate  . Transurethral prostatectomy with gyrus instruments  04/28/2012    Procedure: TRANSURETHRAL PROSTATECTOMY WITH GYRUS INSTRUMENTS;  Surgeon: Fredricka Bonine, MD;  Location: [facility];  Service: Urology;  Laterality: N/A;  . Cataract extraction      FAMILY HISTORY: Family History  Problem Relation Age of Onset  . Restless legs syndrome Mother   . Restless legs syndrome Daughter     SOCIAL HISTORY: History   Social History  . Marital Status: Married    Spouse Name: Brandon Mason    Number of Children: 5  . Years of Education: 12    Occupational History  . retired      [name] for a [employer]  . Smoking status: Never Smoker   . Smokeless tobacco: Never Used  . Alcohol Use: 1.2 oz/week    2 Cans of beer per week     Comment: 2 cans of beer per week   . Drug Use: No  . Sexual Activity: Not on file   Other Topics Concern  . Not on file   Social History Narrative   Patient lives at home with his wife Brandon Mason.   Patient is retired.    Patient has 5 children.   Patient has a high school education.   Patient is right-handed.   Patient drinks very little caffeine.      PHYSICAL EXAM  Filed Vitals:   12/29/13 0928  BP: 138/82  Pulse: 72  Temp: 97 F (36.1 C)  TempSrc: Oral  Height: 5\' 8"  (1.727 m)  Weight: 169 lb (76.658 kg)   Body mass index is 25.7 kg/(m^2).  Generalized: Well developed, in no acute distress   Neurological examination  Mentation: Alert oriented to time, place, history taking. Follows all commands speech and language fluent Cranial nerve II-XII: . Extraocular movements were full, visual field were full on confrontational test. Motor: The motor testing reveals 5 over 5 strength of all 4 extremities. Good symmetric motor tone is noted throughout.  Sensory: Sensory testing is intact to soft touch on all 4 extremities. No evidence of extinction is noted.  Coordination: Cerebellar testing reveals good finger-nose-finger and heel-to-shin bilaterally.  Gait and station: Gait is slightly wide based. Tandem gait is slightly unsteady. Romberg is negative. No drift is seen.  Reflexes: Deep tendon reflexes are symmetric and normal bilaterally.     DIAGNOSTIC DATA (LABS, IMAGING, TESTING) - I reviewed patient records, labs, notes, testing and imaging myself where available.  Lab Results  Component Value Date   WBC 6.8 04/27/2012   HGB 12.1* 04/27/2012   HCT 35.6* 04/27/2012   MCV 97.3 04/27/2012   PLT 273 04/27/2012      Component Value Date/Time    NA 140 04/27/2012 1100   K 4.0 04/27/2012 1100   CL 103 04/27/2012 1100   CO2 28 04/27/2012 1100   GLUCOSE 95 04/27/2012 1100   BUN 20 04/27/2012 1100   CREATININE 0.88 04/27/2012 1100   CALCIUM 8.8 04/27/2012 1100   PROT 6.9 05/14/2013 1146   PROT 5.6* 03/28/2012 0355   ALBUMIN 2.7* 03/28/2012 0355   AST 13 03/28/2012 0355   ALT 9 03/28/2012 0355   ALKPHOS 50 03/28/2012 0355   BILITOT 0.3 03/28/2012 0355   GFRNONAA 77* 04/27/2012 1100   GFRAA 90* 04/27/2012 1100   No results found for this basename: CHOL, HDL, LDLCALC, LDLDIRECT, TRIG, CHOLHDL   Lab Results  Component Value Date  HGBA1C 5.9* 05/14/2013   Lab Results  Component Value Date   VITAMINB12 708 05/14/2013   Lab Results  Component Value Date   TSH 4.880* 05/14/2013      ASSESSMENT AND PLAN 80 y.o. year old male  has a past medical history of Hypertension; Shortness of breath; Arthritis; Prostate disorder; Cardiomegaly; Chronic kidney disease; Neuromuscular disorder; Hypoxia; Nocturia; Hypersomnia; Chronic leg pain; RLS (restless legs syndrome); Abnormality of gait (02/09/2013); Restless legs syndrome (RLS) (02/09/2013); and Restless legs syndrome with nocturnal myoclonus. here with:  1. restless leg syndrome 2. Abnormality of gait  Patient reports that the Requip works well for restless legs however he does have excessive drowsiness after taking the medication. I will reduce his dose to 1 mg. The patient should take 1 tablet at noon, 1 tablet at dinner and one tablet before bedtime. Patient should try this for one week, if there is no improvement in drowsiness his medication will need to be changed. Patient is aware to call our office to let me know how the reduced dose of Requip is working. Patient should follow-up in 4 months or sooner if needed.    Ward Givens, MSN, NP-C 12/29/2013, 9:38 AM [facility] [address] [place], [place] [address] [phone]  Note: This document was  prepared with digital dictation and possible smart phrase technology. Any transcriptional errors that result from this process are unintentional.

## 2014-01-07 ENCOUNTER — Telehealth: Payer: Self-pay

## 2014-01-07 ENCOUNTER — Telehealth: Payer: Self-pay | Admitting: Adult Health

## 2014-01-07 MED ORDER — ROTIGOTINE 1 MG/24HR TD PT24: MEDICATED_PATCH | TRANSDERMAL | Status: DC

## 2014-01-07 NOTE — Telephone Encounter (Signed)
I called the patient back. He said since he has lowered his dose he is no longer drowsy, in fact he only gets about 2 hours of sleep per night. States now his RLS is acting up. He would like to know if dose needs to go back up or if a different med should be prescribed. Please advise. Thank you.   Last Ov note says: Patient reports that the Requip works well for restless legs however he does have excessive drowsiness after taking the medication. I will reduce his dose to 1 mg. The patient should take 1 tablet at noon, 1 tablet at dinner and one tablet before bedtime. Patient should try this for one week, if there is no improvement in drowsiness his medication will need to be changed.

## 2014-01-07 NOTE — Telephone Encounter (Signed)
I called the patient. He states that the reduced dose of Requip is not controlling his restless legs however the 2 mg Requip was causes drowsiness. I consulted with Dr. Brett Fairy and we will try the patient on neupro patch. I have given the patient's samples. The patient was given mg, mg and 4 mg patches. The patient should try 1 mg patch first for 1-2 nights and if it is not beneficial he should increase to the 2 mg patch for 1-2 nights if that is not beneficial then he should increase to mg patch. If the mg patch does not offer benefit the patient should let us know. Patient should also wean off the Requip. He should reduce his dose to BID for two days then once a day for two days then stop.

## 2014-01-07 NOTE — Telephone Encounter (Signed)
Patient stated lower dose of rOPINIRole (REQUIP) 1 MG tablet isn't working.  Requesting alternative to pick before weekend.  Please call and advise

## 2014-01-07 NOTE — Telephone Encounter (Signed)
I called the patient back. He said since he has lowered his dose he is no longer drowsy, in fact he only gets about 2 hours of sleep per night.  States now his RLS is acting up.  He would like to know if dose needs to go back up or if a different med should be prescribed.  Please advise.  Thank you.  Last Ov note says: Patient reports that the Requip works well for restless legs however he does have excessive drowsiness after taking the medication. I will reduce his dose to 1 mg. The patient should take 1 tablet at noon, 1 tablet at dinner and one tablet before bedtime. Patient should try this for one week, if there is no improvement in drowsiness his medication will need to be changed.

## 2014-01-10 ENCOUNTER — Telehealth: Payer: Self-pay | Admitting: Adult Health

## 2014-01-10 MED ORDER — ROPINIROLE HCL ER 2 MG PO TB24: 4.0000 mg | ORAL_TABLET | Freq: Every day | ORAL | Status: DC

## 2014-01-10 NOTE — Telephone Encounter (Signed)
Patient states that the neupro patches are not working for him. The patient is to stop using the patches I will try Requip XL. I explained to the patient that most of the drugs for RLS can cause drowsiness. We will try 4 mg Requip XL and can increase the dose as necessary. The patient should take the medication at noon. He should stop the neupro and immediate release requip. Her verbalized understanding.

## 2014-01-10 NOTE — Telephone Encounter (Signed)
Patient stated Rotigotine (NEUPRO) 1 MG/24HR PT24 isn't helping with Restless leg.  Tried this medication a few months ago and didn't help at that time.  Questioning if he could try something else.  Please call and advise.  Thanks

## 2014-02-03 NOTE — Telephone Encounter (Signed)
Noted  

## 2014-02-16 ENCOUNTER — Encounter: Payer: Self-pay | Admitting: Adult Health

## 2014-02-16 ENCOUNTER — Ambulatory Visit (INDEPENDENT_AMBULATORY_CARE_PROVIDER_SITE_OTHER): Payer: Medicare Other | Admitting: Adult Health

## 2014-02-16 VITALS — BP 128/77 | HR 91 | Ht 67.25 in | Wt 169.0 lb

## 2014-02-16 DIAGNOSIS — R0683 Snoring: Secondary | ICD-10-CM

## 2014-02-16 DIAGNOSIS — R269 Unspecified abnormalities of gait and mobility: Secondary | ICD-10-CM

## 2014-02-16 DIAGNOSIS — R0989 Other specified symptoms and signs involving the circulatory and respiratory systems: Secondary | ICD-10-CM

## 2014-02-16 DIAGNOSIS — G4710 Hypersomnia, unspecified: Secondary | ICD-10-CM

## 2014-02-16 DIAGNOSIS — G4719 Other hypersomnia: Secondary | ICD-10-CM

## 2014-02-16 DIAGNOSIS — R0609 Other forms of dyspnea: Secondary | ICD-10-CM

## 2014-02-16 DIAGNOSIS — G2581 Restless legs syndrome: Secondary | ICD-10-CM

## 2014-02-16 NOTE — Progress Notes (Addendum)
PATIENT: Brandon Mason DOB: 04/16/1926  REASON FOR VISIT: follow up HISTORY FROM: patient  HISTORY OF PRESENT ILLNESS: Brandon Mason is an 80 year old male with a history of restless leg syndrome and gait instability. He returns today for follow up. He currently takes requip XL and Neurontin  for restless legs. He states that he has not noticed a difference with the requip XL. He states that it is a that it only last for 6 hours. He normally takes it at 2 PM. He also states that it has not changed the drowsiness that he experiences with this medication. He wants to go back to immediate release Requip once he finishes this prescription. Patient exercises three times a week. He states that overall he is tired most of the day. He is unsure if this is due to him exercising or medication related. He states that he does snore. He had a sleep study over 2 years ago. He doesn't know how accurate it was because he had to get up 8 times during the night. Epworth score is 17 and fatigue score is 50.  He feels that his gait is back to where it was after his stroke. He has an appointment with his stroke neurologist (not at this office) the first week of September.   HISTORY 12/29/13: history of restless leg syndrome and gait instability. He returns today for follow up. He currently takes requip, Neurontin and tramadol for restless legs. He reports that the Requip works well for the restless legs but it makes him really drowsy during the day. He states that he fell asleep while they had quest over at their house and reports that it was very embarrassing. Patient takes tramadol and gabapentin only at night before bedtime. Since reducing his requip does his compulsive eating has improved some. Patient reports that his gait has improved, he was using a walker but now he only uses a cane. Patient reports that he had a "mini-stroke" 6 weeks ago. States that his initial symptoms were slurred speech and left sided weakness. His  symptoms did resolve and he was placed on an aspirin. He is currently doing therapy at [facility]. There is no history of this in the EPIC system.    REVIEW OF SYSTEMS: Full 14 system review of systems performed and notable only for:  Constitutional: Fatigue, excessive sweating Eyes: Eye itching, double vision, blurred vision Ear/Nose/Throat: Hearing loss, ringing in the ears, runny nose  Skin: N/A  Cardiovascular: N/A  Respiratory: Shortness of breath Gastrointestinal: N/A  Genitourinary: N/A Hematology/Lymphatic: N/A  Endocrine: He in tolerance, excessive eating Musculoskeletal: Walking difficulty, neck stiffness Allergy/Immunology: N/A  Neurological: Memory loss, dizziness, numbness, weakness Psychiatric: Depression Sleep: Restless leg, frequent waking, daytime sleepiness, snoring   ALLERGIES: No Known Allergies  HOME MEDICATIONS: Outpatient Prescriptions Prior to Visit  Medication Sig Dispense Refill  . gabapentin (NEURONTIN) 600 MG tablet Take 1 tablet (600 mg total) by mouth at bedtime. One at bedtime. One at night prn ( usually 3 am)  180 tablet  2  . rOPINIRole (REQUIP XL) 2 MG 24 hr tablet Take 2 tablets (4 mg total) by mouth daily at 12 noon.  60 tablet  3  . Cyanocobalamin (VITAMIN B 12 PO) Take 1,000 mg by mouth daily.      . Multiple Vitamin (MULTIVITAMIN WITH MINERALS) TABS Take 1 tablet by mouth daily.      . traMADol (ULTRAM) 50 MG tablet TAKE 1 TABLET NIGHTLY  30 tablet  3  .  VITAMIN D, CHOLECALCIFEROL, PO Take 1,000 mg by mouth daily.        No facility-administered medications prior to visit.    PAST MEDICAL HISTORY: Past Medical History  Diagnosis Date  . Hypertension   . Shortness of breath     with exertion   . Arthritis   . Prostate disorder   . Cardiomegaly   . Chronic kidney disease     BPH  . Neuromuscular disorder     restless leg syndrome   . Hypoxia     pulmonary  . Nocturia   . Hypersomnia   . Chronic leg pain   . RLS (restless legs  syndrome)     x 40 years  . Abnormality of gait 02/09/2013  . Restless legs syndrome (RLS) 02/09/2013  . Restless legs syndrome with nocturnal myoclonus     PAST SURGICAL HISTORY: Past Surgical History  Procedure Laterality Date  . Hernia repair    . Back surgery  2003    L4-5  . Transurethral resection of prostate  03/24/2012    Procedure: TRANSURETHRAL RESECTION OF THE PROSTATE (TURP);  Surgeon: Fredricka Bonine, MD;  Location: [facility];  Service: Urology;  Laterality: N/A;  with gyrus ,Greenlight PVP laser of Prostate  . Transurethral prostatectomy with gyrus instruments  04/28/2012    Procedure: TRANSURETHRAL PROSTATECTOMY WITH GYRUS INSTRUMENTS;  Surgeon: Fredricka Bonine, MD;  Location: [facility];  Service: Urology;  Laterality: N/A;  . Cataract extraction      FAMILY HISTORY: Family History  Problem Relation Age of Onset  . Restless legs syndrome Mother   . Restless legs syndrome Daughter     SOCIAL HISTORY: History   Social History  . Marital Status: Married    Spouse Name: Brandon Mason    Number of Children: 5  . Years of Education: 12   Occupational History  . retired      [name] for a [employer]  . Smoking status: Never Smoker   . Smokeless tobacco: Never Used  . Alcohol Use: 1.2 oz/week    2 Cans of beer per week     Comment: 2 cans of beer per week   . Drug Use: No  . Sexual Activity: Not on file   Other Topics Concern  . Not on file   Social History Narrative   Patient lives at home with his wife Brandon Mason.   Patient is retired.    Patient has 5 children.   Patient has a high school education.   Patient is right-handed.   Patient drinks very little caffeine.      PHYSICAL EXAM  Filed Vitals:   02/16/14 0905  BP: 128/77  Pulse: 91  Height: 5' 7.25" (1.708 m)  Weight: 169 lb (76.658 kg)   Body mass index is 26.28 kg/(m^2).  Generalized: Well developed, in no acute distress  Neck: Circumference 16-1/2  inches. Mallampati 2+  Neurological examination  Mentation: Alert oriented to time, place, history taking. Follows all commands speech and language fluent. MMSE 28/30 Cranial nerve II-XII: Pupils were equal round reactive to light. Extraocular movements were full, visual field were full on confrontational test. Facial sensation and strength were normal. hearing was intact to finger rubbing on the left, absent on the right. Uvula tongue midline. Head turning and shoulder shrug  were normal and symmetric. Motor: The motor testing reveals 5 over 5 strength of all 4 extremities. Good symmetric motor tone is noted throughout.  Sensory:  Sensory testing is intact to soft touch on all 4 extremities. No evidence of extinction is noted.  Coordination: Cerebellar testing reveals good finger-nose-finger and heel-to-shin bilaterally.  Gait and station: Gait is a little stiff when he started but then stride becomes more smooth. Tandem gait is unsteady. Romberg is negative. No drift is seen.  Reflexes: Deep tendon reflexes are symmetric and normal bilaterally.    DIAGNOSTIC DATA (LABS, IMAGING, TESTING) - I reviewed patient records, labs, notes, testing and imaging myself where available.  Lab Results  Component Value Date   WBC 6.8 04/27/2012   HGB 12.1* 04/27/2012   HCT 35.6* 04/27/2012   MCV 97.3 04/27/2012   PLT 273 04/27/2012      Component Value Date/Time   NA 140 04/27/2012 1100   K 4.0 04/27/2012 1100   CL 103 04/27/2012 1100   CO2 28 04/27/2012 1100   GLUCOSE 95 04/27/2012 1100   BUN 20 04/27/2012 1100   CREATININE 0.88 04/27/2012 1100   CALCIUM 8.8 04/27/2012 1100   PROT 6.9 05/14/2013 1146   PROT 5.6* 03/28/2012 0355   ALBUMIN 2.7* 03/28/2012 0355   AST 13 03/28/2012 0355   ALT 9 03/28/2012 0355   ALKPHOS 50 03/28/2012 0355   BILITOT 0.3 03/28/2012 0355   GFRNONAA 77* 04/27/2012 1100   GFRAA 90* 04/27/2012 1100   No results found for this basename: CHOL, HDL, LDLCALC, LDLDIRECT,  TRIG, CHOLHDL   Lab Results  Component Value Date   HGBA1C 5.9* 05/14/2013   Lab Results  Component Value Date   VITAMINB12 708 05/14/2013   Lab Results  Component Value Date   TSH 4.880* 05/14/2013      ASSESSMENT AND PLAN 80 y.o. year old male  has a past medical history of Hypertension; Shortness of breath; Arthritis; Prostate disorder; Cardiomegaly; Chronic kidney disease; Neuromuscular disorder; Hypoxia; Nocturia; Hypersomnia; Chronic leg pain; RLS (restless legs syndrome); Abnormality of gait (02/09/2013); Restless legs syndrome (RLS) (02/09/2013); and Restless legs syndrome with nocturnal myoclonus. here with:   1. Restless leg syndrome  2. Abnormality of gait  3. Snoring  4. Excessive daytime sleepiness  Patient continues to have issues with drowsiness related to the Requip. He will finish taking the Requip XL as he just got it filled. Once he has finished this he will call our office and we will restart Requip IR. At this time we will have to adjust the times he takes Requip and the dosage until we find a balance. In the past lower dosage of requip have not controlled his RLS but has improved his drowsiness. Patient is having excessive daytime sleepiness and he snores at night. I will refer him for a sleep study. I will also request a copy of his old sleep study from [place] [facility] and review it with Dr. Brett Fairy. If he has sleep apnea this could be contributing to his daytime sleepiness. Patient feels that his walking has reverted back to where he was after his stroke. At this time he has an appointment with his stroke neurologist and wants to follow-up with him regarding these new symptoms.  Patient should follow-up in 4 months or sooner if needed.    Ward Givens, MSN, NP-C 02/16/2014, 9:08 AM [facility] [address], [place], [place] [address] [phone]  Note: This document was prepared with digital dictation and possible smart phrase  technology. Any transcriptional errors that result from this process are unintentional.

## 2014-02-16 NOTE — Progress Notes (Signed)
I agree with the assessment and plan as directed by NP .The patient is known to me .   Dessie Tatem, MD  

## 2014-02-16 NOTE — Patient Instructions (Signed)
Restless Legs Syndrome Restless legs syndrome is a movement disorder. It may also be called a sensorimotor disorder.  CAUSES  No one knows what specifically causes restless legs syndrome, but it tends to run in families. It is also more common in people with low iron, in pregnancy, in people who need dialysis, and those with nerve damage (neuropathy).Some medications may make restless legs syndrome worse.Those medications include drugs to treat high blood pressure, some heart conditions, nausea, colds, allergies, and depression. SYMPTOMS Symptoms include uncomfortable sensations in the legs. These leg sensations are worse during periods of inactivity or rest. They are also worse while sitting or lying down. Individuals that have the disorder describe sensations in the legs that feel like:  Pulling.  Drawing.  Crawling.  Worming.  Boring.  Tingling.  Pins and needles.  Prickling.  Pain. The sensations are usually accompanied by an overwhelming urge to move the legs. Sudden muscle jerks may also occur. Movement provides temporary relief from the discomfort. In rare cases, the arms may also be affected. Symptoms may interfere with going to sleep (sleep onset insomnia). Restless legs syndrome may also be related to periodic limb movement disorder (PLMD). PLMD is another more common motor disorder. It also causes interrupted sleep. The symptoms from PLMD usually occur most often when you are awake. TREATMENT  Treatment for restless legs syndrome is symptomatic. This means that the symptoms are treated.   Massage and cold compresses may provide temporary relief.  Walk, stretch, or take a cold or hot bath.  Get regular exercise and a good night's sleep.  Avoid caffeine, alcohol, nicotine, and medications that can make it worse.  Do activities that provide mental stimulation like discussions, needlework, and video games. These may be helpful if you are not able to walk or stretch. Some  medications are effective in relieving the symptoms. However, many of these medications have side effects. Ask your caregiver about medications that may help your symptoms. Correcting iron deficiency may improve symptoms for some patients. Document Released: 06/07/2002 Document Revised: 11/01/2013 Document Reviewed: 09/13/2010 Exit[employer] Patient Information 2015 [employer]. This information is not intended to replace advice given to you by your health care provider. Make sure you discuss any questions you have with your health care provider.  

## 2014-02-21 ENCOUNTER — Telehealth: Payer: Self-pay | Admitting: Adult Health

## 2014-02-21 DIAGNOSIS — G4719 Other hypersomnia: Secondary | ICD-10-CM

## 2014-02-21 NOTE — Telephone Encounter (Signed)
I called the patient. His previous sleep study did not show significant apnea but did indicate low oxygen levels. I have consulted with Dr. Brett Fairy and she feels due to the low oxygen levels its worth repeating the test. Patient verbalized understanding. We will get this scheduled.

## 2014-03-02 ENCOUNTER — Telehealth: Payer: Self-pay | Admitting: Neurology

## 2014-03-02 DIAGNOSIS — G4710 Hypersomnia, unspecified: Secondary | ICD-10-CM

## 2014-03-02 DIAGNOSIS — N289 Disorder of kidney and ureter, unspecified: Secondary | ICD-10-CM

## 2014-03-02 DIAGNOSIS — G2581 Restless legs syndrome: Secondary | ICD-10-CM

## 2014-03-02 NOTE — Telephone Encounter (Signed)
Ward Givens, NP ,refers patient for attended sleep study.  Height: 5' 7.25"  Weight: 169lb  BMI: 26.28   Past Medical History: Hypertension  .  Shortness of breath  with exertion  .  Arthritis  .  Prostate disorder  .  Cardiomegaly  .  Chronic kidney disease  BPH  .  Neuromuscular disorder  restless leg syndrome  .  Hypoxia  pulmonary  .  Nocturia  .  Hypersomnia  .  Chronic leg pain  .  RLS (restless legs syndrome)  x 40 years  .  Abnormality of gait  02/09/2013  .  Restless legs syndrome (RLS)  02/09/2013  .  Restless legs syndrome with nocturnal myoclonus    Sleep Symptoms: Restless leg, frequent waking, daytime sleepiness, snoring    Epworth Score: Epworth score is (17)     Medication: Ferrous Fumarate (Tab) HEMOCYTE - 106 mg FE 325 (106 FE) MG Take 1 tablet by mouth daily. Gabapentin (Tab) NEURONTIN 600 MG Take 1 tablet (600 mg total) by mouth at bedtime. One at bedtime. One at night prn ( usually 3 am) Olmesartan Medoxomil-HCTZ (Tab) BENICAR HCT 40-12.5 MG ROPINIRole HCl (Tablet SR 24 hr) REQUIP XL 2 MG Take 2 tablets (4 mg total) by mouth daily at 12 noon.    Ins: Medicare/BCBS   Assessment & Plan: 80 y.o. year old male has a past medical history of Hypertension; Shortness of breath; Arthritis; Prostate disorder; Cardiomegaly; Chronic kidney disease; Neuromuscular disorder; Hypoxia; Nocturia; Hypersomnia; Chronic leg pain; RLS (restless legs syndrome); Abnormality of gait (02/09/2013); Restless legs syndrome (RLS) (02/09/2013); and Restless legs syndrome with nocturnal myoclonus. here with:  1. Restless leg syndrome  2. Abnormality of gait  3. Snoring  4. Excessive daytime sleepiness  Patient continues to have issues with drowsiness related to the Requip. He will finish taking the Requip XL as he just got it filled. Once he has finished this he will call our office and we will restart Requip IR. At this time we will have to adjust the times he  takes Requip and the dosage until we find a balance. In the past lower dosage of requip have not controlled his RLS but has improved his drowsiness. Patient is having excessive daytime sleepiness and he snores at night. I will refer him for a sleep study. I will also request a copy of his old sleep study from [facility] and review it with Dr. Brett Fairy. If he has sleep apnea this could be contributing to his daytime sleepiness. Patient feels that his walking has reverted back to where he was after his stroke. At this time he has an appointment with his stroke neurologist and wants to follow-up with him regarding these new symptoms. Patient should follow-up in 4 months or sooner if needed.    Please review patient information and submit instructions for scheduling and orders for sleep technologist. Thank you.

## 2014-03-08 NOTE — Telephone Encounter (Signed)
Patient known to me. schedule for hypersomnia and RLS, SPLIT at 69. AHi. 4% medicare,

## 2014-04-19 ENCOUNTER — Ambulatory Visit (INDEPENDENT_AMBULATORY_CARE_PROVIDER_SITE_OTHER): Payer: Medicare Other

## 2014-04-19 DIAGNOSIS — G4710 Hypersomnia, unspecified: Secondary | ICD-10-CM

## 2014-04-19 DIAGNOSIS — R0683 Snoring: Secondary | ICD-10-CM

## 2014-04-19 DIAGNOSIS — G2581 Restless legs syndrome: Secondary | ICD-10-CM

## 2014-04-19 DIAGNOSIS — G4733 Obstructive sleep apnea (adult) (pediatric): Secondary | ICD-10-CM

## 2014-04-19 DIAGNOSIS — N289 Disorder of kidney and ureter, unspecified: Secondary | ICD-10-CM

## 2014-05-03 ENCOUNTER — Ambulatory Visit: Payer: Medicare Other | Admitting: Adult Health

## 2014-05-04 ENCOUNTER — Telehealth: Payer: Self-pay | Admitting: *Deleted

## 2014-05-04 NOTE — Telephone Encounter (Signed)
Patient's wife was contacted and provided the results of patient's sleep study results.  Though a CPAP study is recommended, an AHI of less than 5 does not qualify patient for long term CPAP treatment.  She was informed to have patient schedule a follow up appointment with Dr. Brett Fairy to discuss other possible treatment options.

## 2014-05-18 ENCOUNTER — Encounter: Payer: Self-pay | Admitting: Neurology

## 2014-05-24 ENCOUNTER — Encounter: Payer: Self-pay | Admitting: Neurology

## 2014-05-24 ENCOUNTER — Other Ambulatory Visit: Payer: Self-pay | Admitting: Adult Health

## 2014-05-24 NOTE — Telephone Encounter (Signed)
Ward Givens, NP at 01/10/2014 9:46 AM     Status: Signed       Expand All Collapse All   Patient states that the neupro patches are not working for him. The patient is to stop using the patches I will try Requip XL. I explained to the patient that most of the drugs for RLS can cause drowsiness. We will try 4 mg Requip XL and can increase the dose as necessary. The patient should take the medication at noon. He should stop the neupro and immediate release requip. Her verbalized understanding

## 2014-06-28 ENCOUNTER — Encounter: Payer: Self-pay | Admitting: Neurology

## 2014-06-28 ENCOUNTER — Ambulatory Visit (INDEPENDENT_AMBULATORY_CARE_PROVIDER_SITE_OTHER): Payer: Medicare Other | Admitting: Neurology

## 2014-06-28 VITALS — BP 144/80 | HR 95 | Ht 67.5 in | Wt 179.0 lb

## 2014-06-28 DIAGNOSIS — G4734 Idiopathic sleep related nonobstructive alveolar hypoventilation: Secondary | ICD-10-CM

## 2014-06-28 DIAGNOSIS — G2581 Restless legs syndrome: Secondary | ICD-10-CM

## 2014-06-28 DIAGNOSIS — G478 Other sleep disorders: Secondary | ICD-10-CM

## 2014-06-28 DIAGNOSIS — J9601 Acute respiratory failure with hypoxia: Secondary | ICD-10-CM

## 2014-06-28 DIAGNOSIS — M25569 Pain in unspecified knee: Secondary | ICD-10-CM

## 2014-06-28 HISTORY — DX: Other sleep disorders: G47.8

## 2014-06-28 MED ORDER — ROPINIROLE HCL 2 MG PO TABS: ORAL_TABLET | ORAL | Status: DC

## 2014-06-28 MED ORDER — OXYCODONE-ACETAMINOPHEN 5-325 MG PO TABS: ORAL_TABLET | ORAL | Status: DC

## 2014-06-28 NOTE — Progress Notes (Addendum)
PATIENT: Brandon Mason DOB: 1925-07-03  REASON FOR VISIT: follow up HISTORY FROM: patient  HISTORY OF PRESENT ILLNESS: Brandon Mason is an 80 year old male with a history of restless leg syndrome and gait instability. He returns today for follow up. He currently takes requip XL and Neurontin  for restless legs. He states that he has not noticed a difference with the requip XL. He states that it is a that it only last for 6 hours. He normally takes it at 2 PM. He also states that it has not changed the drowsiness that he experiences with this medication. He wants to go back to immediate release Requip once he finishes this prescription. Patient exercises three times a week. He states that overall he is tired most of the day. He is unsure if this is due to him exercising or medication related. He states that he does snore. He had a sleep study over 2 years ago. He doesn't know how accurate it was because he had to get up 8 times during the night. Epworth score is 17 and fatigue score is 50.  He feels that his gait is back to where it was after his stroke. He has an appointment with his stroke neurologist (not at this office) the first week of September.   HISTORY 12/29/13: history of restless leg syndrome and gait instability. He returns today for follow up. He currently takes requip, Neurontin and tramadol for restless legs. He reports that the Requip works well for the restless legs but it makes him really drowsy during the day. He states that he fell asleep while they had quest over at their house and reports that it was very embarrassing. Patient takes tramadol and gabapentin only at night before bedtime. Since reducing his requip does his compulsive eating has improved some. Patient reports that his gait has improved, he was using a walker but now he only uses a cane. Patient reports that he had a "mini-stroke" 6 weeks ago. States that his initial symptoms were slurred speech and left sided weakness. His  symptoms did resolve and he was placed on an aspirin. He is currently doing therapy at [facility]. There is no history of this in the EPIC system.   -29-15 after having had a recent sleep study. He had suffered a stroke and was treated by Dr. Quinn Mason at [facility]. His restless legs his myoclonic jerking and his leg cramps have worsened. The Requip makes him too sleepy but on the other hand he sometimes uses her father as a sleep medication than a restless leg relief. He is very desperate at this moment. His sleep study revealed a very mild AHI that would prohibit him to get a CPAP by Medicare criteria yet his RDI was 24 per hour indicating upper airway resistance the syndrome usually upper airway resistance the syndrome can be treated with CPAP if associated with comorbidities. I just learned that Medicare does not accept these comorbidities. I would show further consider sending Brandon Mason to a dentist to deep to fit a advanced mandibular device that hopefully will result help him with snoring and the upper airway resistance seen. I would also like his sleep-disordered breathing to be treated for that he could advance to narcotics or tranquilizers to treat at least enough of the restless legs to allow him some sleep and restoration at night. There were no significant periodic limb movements in sleep noted which was interestingly given his history of restless leg  and severe restless leg I will therefore try to use a sleep inducer - hopefully that'll keep him asleep and then will try to obtain an auto Pap machine or a dental device depending on his insurance coverage and permission thereby.   REVIEW OF SYSTEMS: Full 14 system review of systems performed and notable only for:  Constitutional: Fatigue, excessive sweating Eyes: Eye itching, double vision, blurred vision Ear/Nose/Throat: Hearing loss, ringing in the ears, runny nose  Respiratory: Shortness of breath Endocrine: He in tolerance,  excessive eating Musculoskeletal: Walking difficulty, neck stiffness Allergy/Immunology: N/A leep: Restless leg, frequent waking, daytime sleepiness, snoring  epworth 19, FSS 61 . Depression score 7 .    ALLERGIES: No Known Allergies  HOME MEDICATIONS: Outpatient Prescriptions Prior to Visit  Medication Sig Dispense Refill  . BENICAR HCT 40-12.5 MG per tablet Take 0.5 tablets by mouth daily.     . ferrous fumarate (HEMOCYTE - 106 MG FE) 325 (106 FE) MG TABS tablet Take 1 tablet by mouth daily.    Marland Kitchen gabapentin (NEURONTIN) 600 MG tablet Take 1 tablet (600 mg total) by mouth at bedtime. One at bedtime. One at night prn ( usually 3 am) 180 tablet 2  . rOPINIRole (REQUIP XL) 2 MG 24 hr tablet Take 2 tablets (4 mg total) by mouth daily at 12 noon. 60 tablet 3   No facility-administered medications prior to visit.    PAST MEDICAL HISTORY: Past Medical History  Diagnosis Date  . Hypertension   . Shortness of breath     with exertion   . Arthritis   . Prostate disorder   . Cardiomegaly   . Chronic kidney disease     BPH  . Neuromuscular disorder     restless leg syndrome   . Hypoxia     pulmonary  . Nocturia   . Hypersomnia   . Chronic leg pain   . RLS (restless legs syndrome)     x 40 years  . Abnormality of gait 02/09/2013  . Restless legs syndrome (RLS) 02/09/2013  . Restless legs syndrome with nocturnal myoclonus     PAST SURGICAL HISTORY: Past Surgical History  Procedure Laterality Date  . Hernia repair    . Back surgery  2003    L4-5  . Transurethral resection of prostate  03/24/2012    Procedure: TRANSURETHRAL RESECTION OF THE PROSTATE (TURP);  Surgeon: Fredricka Bonine, MD;  Location: [facility];  Service: Urology;  Laterality: N/A;  with gyrus ,Greenlight PVP laser of Prostate  . Transurethral prostatectomy with gyrus instruments  04/28/2012    Procedure: TRANSURETHRAL PROSTATECTOMY WITH GYRUS INSTRUMENTS;  Surgeon: Fredricka Bonine, MD;  Location: [facility]  [facility];  Service: Urology;  Laterality: N/A;  . Cataract extraction      FAMILY HISTORY: Family History  Problem Relation Age of Onset  . Restless legs syndrome Mother   . Restless legs syndrome Daughter     SOCIAL HISTORY: History   Social History  . Marital Status: Married    Spouse Name: [name]    Number of Children: 5  . Years of Education: 12   Occupational History  . retired      [name] for a [employer]  . Smoking status: Never Smoker   . Smokeless tobacco: Never Used  . Alcohol Use: 1.2 oz/week    2 Cans of beer per week     Comment: 2 cans of beer per week   . Drug Use: No  .  Sexual Activity: Not on file   Other Topics Concern  . Not on file   Social History Narrative   Patient lives at home with his wife [name].   Patient is retired.    Patient has 5 children.   Patient has a high school education.   Patient is right-handed.   Patient drinks very little caffeine.      PHYSICAL EXAM  Filed Vitals:   06/28/14 1430  BP: 144/80  Pulse: 95  Height: 5' 7.5" (1.715 m)  Weight: 179 lb (81.194 kg)   Body mass index is 27.61 kg/(m^2).  Generalized: Well developed, in no acute distress  Neck: Circumference 16-1/2 inches. Mallampati 2+  Neurological examination  Mentation: Alert oriented to time, place, history taking. Follows all commands speech and language fluent. MMSE 28/30 Cranial nerve II-XII: Pupils were equal round reactive to light. Extraocular movements were full, visual field were full on confrontational test. Facial sensation and strength were normal. hearing was intact to finger rubbing on the left, absent on the right. Uvula tongue midline. Head turning and shoulder shrug  were normal and symmetric. Motor: The motor testing reveals 5 over 5 strength of all 4 extremities. Good symmetric motor tone is noted throughout.  Sensory: Sensory testing is intact to soft touch on all 4 extremities. No evidence of extinction  is noted.  Coordination: Cerebellar testing reveals good finger-nose-finger and heel-to-shin bilaterally.  Gait and station: Gait is a little stiff when he started but then stride becomes more smooth. Tandem gait is unsteady. Romberg is negative. No drift is seen.  Reflexes: Deep tendon reflexes are symmetric and normal bilaterally.    DIAGNOSTIC DATA (LABS, IMAGING, TESTING) - I reviewed patient records, labs, notes, testing and imaging myself where available.  Lab Results  Component Value Date   WBC 6.8 04/27/2012   HGB 12.1* 04/27/2012   HCT 35.6* 04/27/2012   MCV 97.3 04/27/2012   PLT 273 04/27/2012      Component Value Date/Time   NA 140 04/27/2012 1100   K 4.0 04/27/2012 1100   CL 103 04/27/2012 1100   CO2 28 04/27/2012 1100   GLUCOSE 95 04/27/2012 1100   BUN 20 04/27/2012 1100   CREATININE 0.88 04/27/2012 1100   CALCIUM 8.8 04/27/2012 1100   PROT 6.9 05/14/2013 1146   PROT 5.6* 03/28/2012 0355   ALBUMIN 2.7* 03/28/2012 0355   AST 13 03/28/2012 0355   ALT 9 03/28/2012 0355   ALKPHOS 50 03/28/2012 0355   BILITOT 0.3 03/28/2012 0355   GFRNONAA 77* 04/27/2012 1100   GFRAA 90* 04/27/2012 1100   No results found for: CHOL Lab Results  Component Value Date   HGBA1C 5.9* 05/14/2013   Lab Results  Component Value Date   VITAMINB12 708 05/14/2013   Lab Results  Component Value Date   TSH 4.880* 05/14/2013      ASSESSMENT AND PLAN 80 y.o. year old male  has a past medical history of Hypertension; Shortness of breath; Arthritis; Prostate disorder; Cardiomegaly; Chronic kidney disease; Neuromuscular disorder; Hypoxia; Nocturia; Hypersomnia; Chronic leg pain; RLS (restless legs syndrome); Abnormality of gait (02/09/2013); Restless legs syndrome (RLS) (02/09/2013); and Restless legs syndrome with nocturnal myoclonus. here with:   1. Restless leg syndrome  2. Abnormality of gait  3. Snoring  4. Excessive daytime sleepiness  Patient continues to have issues with  drowsiness related to the Requip. He reports he can take 3 Requip and go to sleep, but has myoclonic jerks, still. He feels and is  visibly jumpy.   Once he had finished he restarted Requip IR. 2 mg . One dose at lunch at 5 PM and another 10 AM .   At this time we will have to adjust the times he takes Requip and the dosage until we find a balance.   In the past lower dosage of requip have not controlled his RLS but has improved his drowsiness.  Patient is having excessive daytime sleepiness and he snores at night. I will refer him for a sleep study. We reviewed his  sleep study from [facility] .  If he has sleep apnea this could be contributing to his daytime sleepiness.  Patient feels that his walking has reverted back to where he was after his stroke. He  had an appointment with his stroke neurologist  Thom Chimes, [place] )  and decided not to keep it.  He wants to follow-up with [facility] regarding all new symptoms.  Patient should follow-up in 4 months or sooner if needed.    06/28/2014, 2:47 PM [facility] [address], [place], [place] [address] [phone]

## 2014-06-28 NOTE — Patient Instructions (Signed)
Myoclonus Myoclonus is a term that refers to brief, involuntary twitching or jerking of a muscle or a group of muscles. It describes a symptom, and generally, is not a diagnosis of a disease. The myoclonic twitches or jerks are usually caused by sudden muscle contractions. They also can result from brief lapses of contraction. Myoclonic twitches or jerks may occur:  Alone or in sequence.  In a pattern or without pattern.  Infrequently or many times each minute. Often times, myoclonus is one of several symptoms in a wide variety of nervous system disorders such as:  Multiple sclerosis.  Parkinson's disease.  Alzheimer's disease.  Creutzfeldt-Jakob disease. Familiar examples of normal myoclonus include:  Hiccups and jerks.  "Sleep starts" that some people have while drifting off to sleep. Severe cases can severely limit a person's ability to:  Eat.  Talk.  Walk. Myoclonic jerks commonly occur in individuals with epilepsy. The most common types of myoclonus include:  Action.  Cortical reflex.  Essential.  Palatal.  Progressive myoclonus epilepsy.  Reticular reflex.  Sleep.  Stimulus-sensitive. TREATMENT  Treatment for myoclonus consists of medicines that may help reduce symptoms. These drugs (many of which are also used to treat epilepsy) include:   Barbiturates.  Clonazepam.  Phenytoin.  Primidone.  Sodium valproate. The complex origins of myoclonus may require the use of multiple drugs. Document Released: 06/07/2002 Document Revised: 09/09/2011 Document Reviewed: 05/20/2013 Exit[employer] Patient Information 2015 [employer]. This information is not intended to replace advice given to you by your health care provider. Make sure you discuss any questions you have with your health care provider.  

## 2014-08-09 ENCOUNTER — Encounter: Payer: Self-pay | Admitting: Neurology

## 2014-08-09 ENCOUNTER — Ambulatory Visit (INDEPENDENT_AMBULATORY_CARE_PROVIDER_SITE_OTHER): Payer: Medicare Other | Admitting: Neurology

## 2014-08-09 VITALS — BP 132/84 | HR 76 | Ht 68.0 in | Wt 171.0 lb

## 2014-08-09 DIAGNOSIS — G2581 Restless legs syndrome: Secondary | ICD-10-CM

## 2014-08-09 DIAGNOSIS — G609 Hereditary and idiopathic neuropathy, unspecified: Secondary | ICD-10-CM | POA: Diagnosis not present

## 2014-08-09 NOTE — Progress Notes (Signed)
Brandon Mason was seen today in neurologic consultation at the request of Dr. Brett Mason.  His PCP is ESCAJEDA, Delfino Lovett, MD.  The consultation is for the evaluation of RLS.  This is a second opinion only.  I reviewed Brandon Mason records.    The patient is a 81 y.o. year old male with a history of RLS.  The first symptom began 5+ years ago.   It has gotten worse over the last 6 months.  The patient states that he awakens in the AM and feels that he has muscle soreness in the calves and some in the arms and he is having cramping in the feet and calves at night.  He also has a crawling sensation and tingling sensation in the feet and legs to just above the knee.  It is better when he stands and walks and the more he moves it the better.  However, it did not used to involve the entire leg; it has "creeped" up there with time.   Higher dosages of requip may be a little more efficacious but caused EDS (4 mg bid) and at lower dosages of requip (2 mg bid) that hasn't been as helpful but he is still very sleepy.  He literally has attacks of sleep.  He is on neurontin 600 mg, and takes 2 of them at night and it does help the tingling.  He doesn't think that makes him too sleepy.  He has never tried that during the day.  He was on a patch (? Neupro) and it didn't help.  He thinks that he tried mirapex.  He doesn't remember trying klonopin.  Tramadol was listed as being tried but he doesn't remember that.  He has tried levodopa without success as well.    He did have a "stroke" in may.  States that he had transient left sided weakness.    PREVIOUS MEDICATIONS: mirapex, requip (on now, higher dosages with sleep attacks), levodopa, neurontin at bedtime only, ?neupro, ?tramadol  ALLERGIES:  No Known Allergies  CURRENT MEDICATIONS:  Outpatient Encounter Prescriptions as of 08/09/2014  Medication Sig  . aspirin 325 MG tablet Take 325 mg by mouth daily.  Marland Kitchen BENICAR HCT 40-12.5 MG per tablet Take 0.5 tablets by mouth  daily.   . ferrous fumarate (HEMOCYTE - 106 MG FE) 325 (106 FE) MG TABS tablet Take 1 tablet by mouth daily.  Marland Kitchen gabapentin (NEURONTIN) 600 MG tablet Take 1 tablet (600 mg total) by mouth at bedtime. One at bedtime. One at night prn ( usually 3 am)  . rOPINIRole (REQUIP) 2 MG tablet Requip 2 mg up to TID po.  Use the XR form at night time.  . vitamin B-12 (CYANOCOBALAMIN) 1000 MCG tablet Take 1,000 mcg by mouth daily.  . [DISCONTINUED] oxyCODONE-acetaminophen (ROXICET) 5-325 MG per tablet Once at night po for leg pain.    PAST MEDICAL HISTORY:   Past Medical History  Diagnosis Date  . Hypertension   . Shortness of breath     with exertion   . Arthritis   . Prostate disorder   . Cardiomegaly   . Chronic kidney disease     BPH  . Neuromuscular disorder     restless leg syndrome   . Hypoxia     pulmonary  . Nocturia   . Hypersomnia   . Chronic leg pain   . RLS (restless legs syndrome)     x 40 years  . Abnormality of gait 02/09/2013  . Restless legs syndrome (  RLS) 02/09/2013  . Restless legs syndrome with nocturnal myoclonus   . UARS (upper airway resistance syndrome) 06/28/2014    PAST SURGICAL HISTORY:   Past Surgical History  Procedure Laterality Date  . Hernia repair    . Back surgery  2003    L4-5  . Transurethral resection of prostate  03/24/2012    Procedure: TRANSURETHRAL RESECTION OF THE PROSTATE (TURP);  Surgeon: Brandon Bonine, MD;  Location: [facility];  Service: Urology;  Laterality: N/A;  with gyrus ,Greenlight PVP laser of Prostate  . Transurethral prostatectomy with gyrus instruments  04/28/2012    Procedure: TRANSURETHRAL PROSTATECTOMY WITH GYRUS INSTRUMENTS;  Surgeon: Brandon Bonine, MD;  Location: [facility];  Service: Urology;  Laterality: N/A;  . Cataract extraction      SOCIAL HISTORY:   History   Social History  . Marital Status: Married    Spouse Name: Brandon Mason    Number of Children: 5  . Years of Education: 12   Occupational History  .  retired      [name] for a [employer]  . Smoking status: Never Smoker   . Smokeless tobacco: Never Used  . Alcohol Use: 1.2 oz/week    2 Cans of beer per week     Comment: 2 cans of beer per week   . Drug Use: No  . Sexual Activity: Not on file   Other Topics Concern  . Not on file   Social History Narrative   Patient lives at home with his wife Brandon Mason.   Patient is retired.    Patient has 5 children.   Patient has a high school education.   Patient is right-handed.   Patient drinks very little caffeine.    FAMILY HISTORY:   Family Status  Relation Status Death Age  . Mother Deceased 32    heart problems, RLS  . Father Deceased 67    heart problems  . Daughter Alive     RLS, healthy  . Daughter Alive     healthy  . Son Alive     healthy  . Son Alive     healthy  . Son Alive     healthy    ROS:  A complete 10 system review of systems was obtained and was unremarkable apart from what is mentioned above.  PHYSICAL EXAMINATION:    VITALS:   Filed Vitals:   08/09/14 1408  BP: 132/84  Pulse: 76  Height: 5\' 8"  (1.727 m)  Weight: 171 lb (77.565 kg)    GEN:  Normal appears male in no acute distress.  Appears stated age. HEENT:  Normocephalic, atraumatic. The mucous membranes are moist. The superficial temporal arteries are without ropiness or tenderness. Cardiovascular: Regular rate and rhythm. Lungs: Clear to auscultation bilaterally. Neck/Heme: There are no carotid bruits noted bilaterally.  NEUROLOGICAL: Orientation:  The patient is alert and oriented x 3.  Fund of knowledge is appropriate.  Recent and remote memory intact.  Attention span and concentration normal.  Repeats and names without difficulty. Cranial nerves: There is good facial symmetry. The pupils are equal round and reactive to light bilaterally. Fundoscopic exam reveals clear disc margins bilaterally. Extraocular muscles are intact and visual fields are full to  confrontational testing. There is horizontal end gaze nystagmus on the left.   Speech is fluent and clear. Soft palate rises symmetrically and there is no tongue deviation. Hearing is intact to conversational tone. Tone: Tone is good throughout.  Sensation: Sensation is intact to light touch and pinprick throughout (facial, trunk, extremities). Pinprick is decreased in a stocking distribution.  Vibration is markedly decreased in a distal fashion. There is no extinction with double simultaneous stimulation. There is no sensory dermatomal level identified. Coordination:  The patient has no difficulty with RAM's or FNF bilaterally. Motor: Strength is 5/5 in the bilateral upper and lower extremities.  Shoulder shrug is equal and symmetric. There is no pronator drift.  There are no fasciculations noted. DTR's: Deep tendon reflexes are 3/4 at the bilateral biceps, triceps, brachioradialis, patella and achilles.  Plantar responses are downgoing bilaterally. Gait and Station: The patient is mildly unsteady.  He has significant difficulty ambulating in a tandem fashion.  Able to stand in romberg position with eyes open but not with eyes closed.   IMPRESSION/PLAN  1. Paresthesias  -While i do think that the patient has long-standing RLS, I think that he has perhaps has developed some peripheral neuropathy over the course of time, which is confusing the issue.    -I would recommend EMG  -I would recommend lab work to look at reversible causes for PN  -I would consider daytime dosages of the gabapentin, or changing gabapentin to lyrica if that doesn't work.  Cymbalta could be a consideration as well.    -I will try to get him a sooner appt with Dr. Brett Mason so they can discuss these recommendations.  -Pt will be returned to the full care of Dr. Brett Mason.  Thank you, Dr. Brett Mason for allowing me to participate in the care of your patient.

## 2014-08-09 NOTE — Progress Notes (Signed)
I have received and read the recommendations made by d R. Tat, DO and will mahke a follow up appointment based on these. I agree with the assessment and plan  .The patient is known to me.   Melodie Ashworth, MD

## 2014-08-10 ENCOUNTER — Ambulatory Visit (INDEPENDENT_AMBULATORY_CARE_PROVIDER_SITE_OTHER): Payer: Medicare Other | Admitting: Adult Health

## 2014-08-10 ENCOUNTER — Encounter: Payer: Self-pay | Admitting: Adult Health

## 2014-08-10 VITALS — BP 169/92 | HR 82 | Ht 68.0 in

## 2014-08-10 DIAGNOSIS — R200 Anesthesia of skin: Secondary | ICD-10-CM

## 2014-08-10 DIAGNOSIS — G2581 Restless legs syndrome: Secondary | ICD-10-CM | POA: Diagnosis not present

## 2014-08-10 DIAGNOSIS — R202 Paresthesia of skin: Secondary | ICD-10-CM

## 2014-08-10 MED ORDER — PREGABALIN 50 MG PO CAPS: 50.0000 mg | ORAL_CAPSULE | Freq: Two times a day (BID) | ORAL | Status: DC

## 2014-08-10 NOTE — Patient Instructions (Addendum)
Continue Requip.  Start Lyrica 50 mg twice a day. If tolerating- we can increase the dose to get maximal benefit.  Please call and let me know how this is working for you.   Pregabalin capsules What is this medicine? PREGABALIN (pre GAB a lin) is used to treat nerve pain from diabetes, shingles, spinal cord injury, and fibromyalgia. It is also used to control seizures in epilepsy. This medicine may be used for other purposes; ask your health care provider or pharmacist if you have questions. COMMON BRAND NAME(S): Lyrica What should I tell my health care provider before I take this medicine? They need to know if you have any of these conditions: -bleeding problems -heart disease, including heart failure -history of alcohol or drug abuse -kidney disease -suicidal thoughts, plans, or attempt; a previous suicide attempt by you or a family member -an unusual or allergic reaction to pregabalin, gabapentin, other medicines, foods, dyes, or preservatives -pregnant or trying to get pregnant or trying to conceive with your partner -breast-feeding How should I use this medicine? Take this medicine by mouth with a glass of water. Follow the directions on the prescription label. You can take this medicine with or without food. Take your doses at regular intervals. Do not take your medicine more often than directed. Do not stop taking except on your doctor's advice. A special MedGuide will be given to you by the pharmacist with each prescription and refill. Be sure to read this information carefully each time. Talk to your pediatrician regarding the use of this medicine in children. Special care may be needed. Overdosage: If you think you have taken too much of this medicine contact a poison control center or emergency room at once. NOTE: This medicine is only for you. Do not share this medicine with others. What if I miss a dose? If you miss a dose, take it as soon as you can. If it is almost time for  your next dose, take only that dose. Do not take double or extra doses. What may interact with this medicine? -alcohol -certain medicines for blood pressure like captopril, enalapril, or lisinopril -certain medicines for diabetes, like pioglitazone or rosiglitazone -certain medicines for anxiety or sleep -narcotic medicines for pain This list may not describe all possible interactions. Give your health care provider a list of all the medicines, herbs, non-prescription drugs, or dietary supplements you use. Also tell them if you smoke, drink alcohol, or use illegal drugs. Some items may interact with your medicine. What should I watch for while using this medicine? Tell your doctor or healthcare professional if your symptoms do not start to get better or if they get worse. Visit your doctor or health care professional for regular checks on your progress. Do not stop taking except on your doctor's advice. You may develop a severe reaction. Your doctor will tell you how much medicine to take. Wear a medical identification bracelet or chain if you are taking this medicine for seizures, and carry a card that describes your disease and details of your medicine and dosage times. You may get drowsy or dizzy. Do not drive, use machinery, or do anything that needs mental alertness until you know how this medicine affects you. Do not stand or sit up quickly, especially if you are an older patient. This reduces the risk of dizzy or fainting spells. Alcohol may interfere with the effect of this medicine. Avoid alcoholic drinks. If you have a heart condition, like congestive heart failure, and notice  that you are retaining water and have swelling in your hands or feet, contact your health care provider immediately. The use of this medicine may increase the chance of suicidal thoughts or actions. Pay special attention to how you are responding while on this medicine. Any worsening of mood, or thoughts of suicide or  dying should be reported to your health care professional right away. This medicine has caused reduced sperm counts in some men. This may interfere with the ability to father a child. You should talk to your doctor or health care professional if you are concerned about your fertility. Women who become pregnant while using this medicine for seizures may enroll in the [place] Pregnancy Registry by calling [phone]. This registry collects information about the safety of antiepileptic drug use during pregnancy. What side effects may I notice from receiving this medicine? Side effects that you should report to your doctor or health care professional as soon as possible: -allergic reactions like skin rash, itching or hives, swelling of the face, lips, or tongue -breathing problems -changes in vision -chest pain -confusion -jerking or unusual movements of any part of your body -loss of memory -muscle pain, tenderness, or weakness -suicidal thoughts or other mood changes -swelling of the ankles, feet, hands -unusual bruising or bleeding Side effects that usually do not require medical attention (Report these to your doctor or health care professional if they continue or are bothersome.): -dizziness -drowsiness -dry mouth -headache -nausea -tremors -trouble sleeping -weight gain This list may not describe all possible side effects. Call your doctor for medical advice about side effects. You may report side effects to FDA at 1-800-FDA-1088. Where should I keep my medicine? Keep out of the reach of children. This medicine can be abused. Keep your medicine in a safe place to protect it from theft. Do not share this medicine with anyone. Selling or giving away this medicine is dangerous and against the law. Store at room temperature between 15 and 30 degrees C (59 and 86 degrees F). Throw away any unused medicine after the expiration date. NOTE: This sheet is a  summary. It may not cover all possible information. If you have questions about this medicine, talk to your doctor, pharmacist, or health care provider.  2015, Elsevier/Gold Standard. (2010-12-20 20:00:36)

## 2014-08-10 NOTE — Progress Notes (Addendum)
I agree with the assessment and plan as directed by NP .The patient is known to me . Dear Brandon Mason, since Mr. Cork seems to have a neuropathy as well as restless leg syndrome it is essential to know if he can tell the difference between the symptoms of either conditions. If so he could be a candidate for a IV iron infusion.   Jadi Deyarmin, MD

## 2014-08-10 NOTE — Progress Notes (Signed)
PATIENT: Brandon Mason DOB: 1925/12/23  REASON FOR VISIT: follow up- RLS - numbness and tingling lower extremities HISTORY FROM: patient  HISTORY OF PRESENT ILLNESS: [name] is an 81 year old male with history of RLS. He returns today for follow-up. He was recently evaluated by Dr. Carles Collet. She felt that the patient could be having symptoms of peripheral neuropathy as well as RLS. She recommended NCS with EMG.  She also recommended daytime doses of gabapentin or lyrica. Patient is currently taking Requip 2mg  TID and gabapentin 600 mg during the night. Patient states he continues to have muscle cramps and burning and tingling in the feet. He states that the Requip continues to make him sleepy during the day but he does notice some benefit.  He states that the gabapentin does not offer him any benefit. The patient states that he does have burning and tingling during the day but only when he is resting. He states that if he stays on his feet all day he does not notice it. Patient states that he feels "hopeless" and feels that his symptoms will never be adequately treated since he has already tried so much in the past. Patient did have a sleep study that showed mild apnea but severe UARS. He states he received the report but nothing further.   REVIEW OF SYSTEMS: Out of a complete 14 system review of symptoms, the patient complains only of the following symptoms, and all other reviewed systems are negative.  Activity change, fatigue, hearing loss, drooling, double vision, loss of vision, shortness of breath, leg swelling, excessive eating, frequent waking, daytime sleepiness, snoring, joint pain, joint swelling, aching muscles, muscle cramps, walking difficulty,neck pain, neck stiffness, depression, memory loss, dizziness, headache, speech difficulty, weakness    ALLERGIES: No Known Allergies  HOME MEDICATIONS: Outpatient Prescriptions Prior to Visit  Medication Sig Dispense Refill  . aspirin 325  MG tablet Take 325 mg by mouth daily.    Marland Kitchen BENICAR HCT 40-12.5 MG per tablet Take 0.5 tablets by mouth daily.     . ferrous fumarate (HEMOCYTE - 106 MG FE) 325 (106 FE) MG TABS tablet Take 1 tablet by mouth daily.    Marland Kitchen gabapentin (NEURONTIN) 600 MG tablet Take 1 tablet (600 mg total) by mouth at bedtime. One at bedtime. One at night prn ( usually 3 am) 180 tablet 2  . rOPINIRole (REQUIP) 2 MG tablet Requip 2 mg up to TID po.  Use the XR form at night time. 90 tablet 3  . vitamin B-12 (CYANOCOBALAMIN) 1000 MCG tablet Take 1,000 mcg by mouth daily.     No facility-administered medications prior to visit.    PAST MEDICAL HISTORY: Past Medical History  Diagnosis Date  . Hypertension   . Shortness of breath     with exertion   . Arthritis   . Prostate disorder   . Cardiomegaly   . Chronic kidney disease     BPH  . Neuromuscular disorder     restless leg syndrome   . Hypoxia     pulmonary  . Nocturia   . Hypersomnia   . Chronic leg pain   . RLS (restless legs syndrome)     x 40 years  . Abnormality of gait 02/09/2013  . Restless legs syndrome (RLS) 02/09/2013  . Restless legs syndrome with nocturnal myoclonus   . UARS (upper airway resistance syndrome) 06/28/2014    PAST SURGICAL HISTORY: Past Surgical History  Procedure Laterality Date  . Hernia repair    .  Back surgery  2003    L4-5  . Transurethral resection of prostate  03/24/2012    Procedure: TRANSURETHRAL RESECTION OF THE PROSTATE (TURP);  Surgeon: Fredricka Bonine, MD;  Location: [facility];  Service: Urology;  Laterality: N/A;  with gyrus ,Greenlight PVP laser of Prostate  . Transurethral prostatectomy with gyrus instruments  04/28/2012    Procedure: TRANSURETHRAL PROSTATECTOMY WITH GYRUS INSTRUMENTS;  Surgeon: Fredricka Bonine, MD;  Location: [facility];  Service: Urology;  Laterality: N/A;  . Cataract extraction      FAMILY HISTORY: Family History  Problem Relation Age of Onset  . Restless legs syndrome  Mother   . Restless legs syndrome Daughter         PHYSICAL EXAM  Filed Vitals:   08/10/14 0834  BP: 169/92  Pulse: 82  Height: 5\' 8"  (1.727 m)   There is no weight on file to calculate BMI.  Generalized: Well developed, in no acute distress   Neurological examination  Mentation: Alert oriented to time, place, history taking. Follows all commands speech and language fluent Cranial nerve II-XII: Pupils were equal round reactive to light. Extraocular movements were full, visual field were full on confrontational test. Facial sensation and strength were normal. Uvula tongue midline. Head turning and shoulder shrug  were normal and symmetric. Motor: The motor testing reveals 5 over 5 strength of all 4 extremities. Good symmetric motor tone is noted throughout.  Sensory: Sensory testing is intact to soft touch on all 4 extremities pinprick and vibration sensation decreased in the lower extremities. No evidence of extinction is noted.  Coordination: Cerebellar testing reveals good finger-nose-finger and heel-to-shin bilaterally.  Gait and station: Gait is normal. Reflexes: Deep tendon reflexes are symmetric and normal bilaterally.    DIAGNOSTIC DATA (LABS, IMAGING, TESTING) - I reviewed patient records, labs, notes, testing and imaging myself where available.  Lab Results  Component Value Date   WBC 6.8 04/27/2012   HGB 12.1* 04/27/2012   HCT 35.6* 04/27/2012   MCV 97.3 04/27/2012   PLT 273 04/27/2012      Component Value Date/Time   NA 140 04/27/2012 1100   K 4.0 04/27/2012 1100   CL 103 04/27/2012 1100   CO2 28 04/27/2012 1100   GLUCOSE 95 04/27/2012 1100   BUN 20 04/27/2012 1100   CREATININE 0.88 04/27/2012 1100   CALCIUM 8.8 04/27/2012 1100   PROT 6.9 05/14/2013 1146   PROT 5.6* 03/28/2012 0355   ALBUMIN 2.7* 03/28/2012 0355   AST 13 03/28/2012 0355   ALT 9 03/28/2012 0355   ALKPHOS 50 03/28/2012 0355   BILITOT 0.3 03/28/2012 0355   GFRNONAA 77* 04/27/2012 1100     GFRAA 90* 04/27/2012 1100   No results found for: CHOL, HDL, LDLCALC, LDLDIRECT, TRIG, CHOLHDL Lab Results  Component Value Date   HGBA1C 5.9* 05/14/2013   Lab Results  Component Value Date   VITAMINB12 708 05/14/2013   Lab Results  Component Value Date   TSH 4.880* 05/14/2013      ASSESSMENT AND PLAN 81 y.o. year old male  has a past medical history of Hypertension; Shortness of breath; Arthritis; Prostate disorder; Cardiomegaly; Chronic kidney disease; Neuromuscular disorder; Hypoxia; Nocturia; Hypersomnia; Chronic leg pain; RLS (restless legs syndrome); Abnormality of gait (02/09/2013); Restless legs syndrome (RLS) (02/09/2013); Restless legs syndrome with nocturnal myoclonus; and UARS (upper airway resistance syndrome) (06/28/2014). here with:  1. RLS 2. Numbness and tingling lower extremities  Continue Requip. I will try Lyrica instead of gabapentin. He  will start on a low dose of Lyrica only taking 50 mg BID. I explained to the patient that more than likely this will need to be increased if he can tolerate the medication. He will D/C the gabapentin. On exam patient did exhibit signs of peripheral neuropathy. I will order NCV with EMG of both legs to evaluate. Patient has had some blood work completed in the past. Future blood work may be ordered. Patient was advised to call and let us know how the lyrica is working so we can adjust the dose to make him more comfortable. He verbalized understanding. He will F/U in 2 months or sooner if needed.   Ward Givens, MSN, NP-C 08/10/2014, 8:59 AM [facility] [address] [place], [place] [address] [phone]  Note: This document was prepared with digital dictation and possible smart phrase technology. Any transcriptional errors that result from this process are unintentional.

## 2014-08-11 ENCOUNTER — Telehealth: Payer: Self-pay

## 2014-08-11 DIAGNOSIS — R200 Anesthesia of skin: Secondary | ICD-10-CM | POA: Diagnosis not present

## 2014-08-11 DIAGNOSIS — R531 Weakness: Secondary | ICD-10-CM | POA: Diagnosis not present

## 2014-08-11 NOTE — Telephone Encounter (Signed)
I called the patient. He states he has only taken 3 doses of lyrica but feels very "shakey" and weak. The patient is not diabetic. I explained this is not common side effects of Lyrica. I advised the patient that he could visit his PCP to be sure nothing else is going on. I advised that he could stop taking the Lyrica for a couple of days and see if that improves the weakness and "shakey" feeling. Patient plans to go to his PCP today, if everything checks out ok he will stop the lyrica for a couple of days.

## 2014-08-11 NOTE — Telephone Encounter (Signed)
P             Demographics  Val Schiavo 81 year old male January 25, 1926  [place] [address] [phone] (H)  Comm Pref: None    Advanced Directives  08/10/14 04/28/12 04/27/12  Does patient have an advance directive? No Patient does not have advance directive Patient does not have advance directive  Problem List  New problems from outside sources are available for reconciliation  Acute kidney injury   Abnormality of gait   Restless legs syndrome (RLS)   Restless legs syndrome with nocturnal myoclonus   Restless leg syndrome, familial, uncontrolled   UARS (upper airway resistance syndrome)    Mark as Reviewed Reviewed by NP on 08/10/2014.  Health MaintenancePostponedSoonDueLate       Topic Due Last Communication   TETANUS/TDAP 06/06/1947    COLONOSCOPY 06/05/1978    ZOSTAVAX 06/05/1988    INFLUENZA VACCINE 01/29/2014    PNEUMOCOCCAL POLYSACCHARIDE VACCINE AGE 93 AND OVER Completed   Reminders and Results None     Care Team and Communications Referring Provider   No referring provider set   PCPs Type  Rubie Maid, MD General  Other Patient Care Team Members Relationship  None   Recipients of Past Communications   Hospital Encounter - 04/14/2012    Festus Aloe, MD 04/29/2012 Fax  Christain Sacramento, MD 04/29/2012 In Randal Buba, MD 04/28/2012 Fax  Christain [place], MD 04/28/2012 In Randal Buba, MD 04/27/2012 Fax  Christain Sacramento, MD 04/27/2012 In [facility] Encounter - 04/08/2012    Festus Aloe, MD 04/08/2012 Fax  Christain Sacramento, MD 04/08/2012 In [facility] Encounter - 03/27/2012    Robbie Lis, MD 03/28/2012 In [place], MD 03/28/2012 In [place], MD 03/27/2012 In [place], MD 03/27/2012 In [place], MD 03/27/2012 In [place], MD 03/27/2012 In [place], MD 03/27/2012 Fax  Christain Sacramento, MD  03/27/2012 In Randal Buba, MD 03/27/2012 Fax  Christain Sacramento, MD 03/27/2012 In [facility] Encounter - 03/18/2012    Christain Sacramento, MD 03/18/2012 In Randal Buba, MD 03/18/2012 [facility] Encounter - 03/10/2012    Festus Aloe, MD 03/25/2012 Fax  Christain Sacramento, MD 03/25/2012 In Randal Buba, MD 03/24/2012 Fax  Christain Sacramento, MD 03/24/2012 In Randal Buba, MD 03/23/2012 Fax  Christain Sacramento, MD 03/23/2012 In Basket  My Last Outpatient Progress Note You have written no Outpatient Progress Notes for this patient   Date of Birth 08/20/1925 Vitals  08/10/14 08/09/14 06/28/14  BP 169/92 mmHg 132/84 mmHg 144/80 mmHg  Pulse Rate 82 76 95  Height 5\' 8"  (1.727 m) 5\' 8"  (1.727 m) 5' 7.5" (1.715 m)  Weight  171 lb (77.565 kg) 179 lb (81.194 kg)  BMI (Calculated)  26.1 27.7  Allergies    No Known Allergies   Mark as: Review Complete Review Complete by NP on 08/10/2014.  Medications Outpatient Medications (6) Hospital Medications (0) Clinic-Administered Medications (0)   aspirin 325 MG tablet   BENICAR HCT 40-12.5 MG per tablet   ferrous fumarate (HEMOCYTE - 106 MG FE) 325 (106 FE) MG TABS tablet   pregabalin (LYRICA) 50 MG capsule   rOPINIRole (REQUIP) 2 MG tablet   vitamin B-12 (CYANOCOBALAMIN) 1000 MCG tablet  Mark as Reviewed Reviewed by NP on 08/10/2014.  Preferred Pharmacies    [id number] - [place], [place] - [address] [phone] (Phone) [phone] (Fax)  [place], [place] [phone] (Phone) [phone] (Fax)  Immunizations/Injections    Influenza Split 04/29/2012  Pneumococcal Polysaccharide-23 04/29/2012  Significant History/Details   Smoking: Never Smoker   Smokeless Tobacco: Never Used  Alcohol: 1.2 oz alcohol/week  1 open order  Preferred Language: English  Specialty CommentsEditShow AllReport No comments regarding your specialty  Family  CommentsEdit None                                                                                                                                                                                                                                                                                                                                                                         atient calling and states that he does not feel good. Patient states that he is very weak and has the shakes please call and advise could this be from

## 2014-08-11 NOTE — Telephone Encounter (Signed)
Patient calling and states he feels very weak and has the shakes. Patient wants to know could it be  Ly rica  50 mg bid. Patient states he has only taking the rx for two days  last dose  At 12:oo today . Marland Kitchen Patient just wants Jinny Blossom to know . Patient also wants to know should he call his PCP.

## 2014-08-16 ENCOUNTER — Ambulatory Visit (INDEPENDENT_AMBULATORY_CARE_PROVIDER_SITE_OTHER): Payer: Self-pay | Admitting: Neurology

## 2014-08-16 ENCOUNTER — Ambulatory Visit (INDEPENDENT_AMBULATORY_CARE_PROVIDER_SITE_OTHER): Payer: Medicare Other | Admitting: Neurology

## 2014-08-16 DIAGNOSIS — G609 Hereditary and idiopathic neuropathy, unspecified: Secondary | ICD-10-CM

## 2014-08-16 DIAGNOSIS — R200 Anesthesia of skin: Secondary | ICD-10-CM

## 2014-08-16 DIAGNOSIS — R202 Paresthesia of skin: Principal | ICD-10-CM

## 2014-08-16 NOTE — Progress Notes (Signed)
    [facility]    Provider:  Dr Jaynee Eagles Referring Provider: Ward Givens, NP Primary Care Physician:  Rubie Maid, MD  History:  Brandon Mason is a 81 y.o. male here for evaluation of neuropathy. He have severe RLS. He reports numbness in the bottom of the feet and in the toes. He has tingling up to his knees. He gets cramps at night and has to "walk it out". Denies burning pain. His balance is poor but attributes that to his stroke. He also has some numbness in the fingers. He has a history of low back pain and right-sided lumbar radiculopathy s/p lumbar surgery 10 years ago. The left leg is worse as far as the current symptoms are concerned.   Summary: Nerve conduction studies were performed on the bilateral lower extremities.  Bilateral Tibial and Left Peroneal motor conductions were within normal limits with normal F Wave latencies.  The right Peroneal motor conduction showed decreased amplitude (0.37mV, N>2) with normal F Wave latency. Bilateral Sural and Superficial Peroneal sensory conductions were within normal limits Bilateral H Reflex latencies showed normal latencies.  EMG needle study was performed on selected left lower extremity muscles. The Medial Gastrocnemius muscle showed increased spontaneous activity. The Extensor Hallucis Longus muscle showed reduced motor unit recruitment and prolonged motor unit duration. The Abductor Hallucis muscle showed decreased insertional activity, patient was unable to activate due to pain.The Extensor Digitorum Brevis showed reduced motor unit recruitment, giant motor unit amplitude, prolonged motor unit duration. The Vastus Medialis, Anterior Tibialis, Gluteus Maximus, Gluteus Medius, Biceps Femoris (long head) muscles were within normal limits. Could not sample the lower lumbar paraspinal muscles as those are unreliable after surgery.    Conclusion:  This is an abnormal study. Acute/ongoing denervation and chronic  neurogenic changes seen in distal leg and foot muscles consistent with length-dependent axonal polyneuropathy. The reduced insertional activity from the Abductor Hallucis muscle in the foot suggests distal muscle atrophy. The decreased Peroneal motor amplitude on the right is consistent with patient's history of remote right-sided lumbar radiculopathy. No suggestion of left-sided radiculopathy. Clinical correlation recommended.   Sarina Ill, MD  [facility] [address] [place], [place] [address]  Phone [phone] Fax [phone]

## 2014-08-18 NOTE — Progress Notes (Signed)
See procedure note.

## 2014-08-18 NOTE — Procedures (Signed)
[facility]    Provider:  Dr Jaynee Eagles Referring Provider: Ward Givens, NP Primary Care Physician:  Rubie Maid, MD  History:  Brandon Mason is a 81 y.o. male here for evaluation of neuropathy. He have severe RLS. He reports numbness in the bottom of the feet and in the toes. He has tingling up to his knees. He gets cramps at night and has to "walk it out". Denies burning pain. His balance is poor but attributes that to his stroke. He also has some numbness in the fingers. He has a history of low back pain and right-sided lumbar radiculopathy s/p lumbar surgery 10 years ago. The left leg is worse as far as the current symptoms are concerned.   Summary: Nerve conduction studies were performed on the bilateral lower extremities.  Bilateral Tibial and Left Peroneal motor conductions were within normal limits with normal F Wave latencies.  The right Peroneal motor conduction showed decreased amplitude (0.39mV, N>2) with normal F Wave latency. Bilateral Sural and Superficial Peroneal sensory conductions were within normal limits Bilateral H Reflex latencies showed normal latencies.  EMG needle study was performed on selected left lower extremity muscles. The Medial Gastrocnemius muscle showed increased spontaneous activity. The Extensor Hallucis Longus muscle showed reduced motor unit recruitment and prolonged motor unit duration. The Abductor Hallucis muscle showed decreased insertional activity, patient was unable to activate due to pain.The Extensor Digitorum Brevis showed reduced motor unit recruitment, giant motor unit amplitude, prolonged motor unit duration. The Vastus Medialis, Anterior Tibialis, Gluteus Maximus, Gluteus Medius, Biceps Femoris (long head) muscles were within normal limits. Could not sample the lower lumbar paraspinal muscles as those are unreliable after surgery.    Conclusion:  This is an abnormal study. Acute/ongoing denervation and chronic  neurogenic changes seen in distal leg and foot muscles consistent with length-dependent axonal polyneuropathy. The reduced insertional activity from the Abductor Hallucis muscle in the foot suggests distal muscle atrophy. The decreased Peroneal motor amplitude on the right is consistent with patient's history of remote right-sided lumbar radiculopathy. No suggestion of left-sided radiculopathy. Clinical correlation recommended.   Brandon Ill, MD  [facility] [address] [place], [place] [address]  Phone [phone] Fax [phone]

## 2014-08-19 ENCOUNTER — Telehealth: Payer: Self-pay | Admitting: Adult Health

## 2014-08-19 NOTE — Telephone Encounter (Signed)
I called the patient in regards to his NCS with EMG results. Left a message for him to call the office.

## 2014-08-22 ENCOUNTER — Other Ambulatory Visit: Payer: Self-pay | Admitting: Neurology

## 2014-08-22 NOTE — Telephone Encounter (Signed)
Patient returning call.

## 2014-08-22 NOTE — Telephone Encounter (Signed)
I called the patient. I reviewed his NCS with EMG. It did show length-dependent polyneuropathy. The patient recently restarted the Lyrica to see if he could tolerate it. If he can't tolerate this medication we will try nortriptyline or Cymbalta. The patient states that the tingling sensation in the legs causes more discomfort than the leg cramps he experiences.

## 2014-08-31 ENCOUNTER — Telehealth: Payer: Self-pay | Admitting: Adult Health

## 2014-08-31 MED ORDER — DULOXETINE HCL 30 MG PO CPEP: ORAL_CAPSULE | ORAL | Status: DC

## 2014-08-31 NOTE — Telephone Encounter (Signed)
Please consider for iron infusion.

## 2014-08-31 NOTE — Telephone Encounter (Signed)
Patient stated Rx pregabalin (LYRICA) 50 MG capsule isn't working.  Questioning other alternatives.  Please call and advise.

## 2014-08-31 NOTE — Telephone Encounter (Signed)
I called the patient. He states that he did not get any benefit with the lyrica. He increased the dose to 100 mg BID but still did not notice any improvement. He continues to have burning and tingling in the lower extremities that mainly effects him at night. He would like to try something else. He has tried gabapentin in the past without benefit. I will try Cymbalta. He will begin taking 30 mg 1 tablet at bedtime for 1 week. If he is tolerating it well then we will increase to 2 tablet at bedtime. I reviewed the side effects with the patient. He verbalized understanding.

## 2014-09-01 DIAGNOSIS — R600 Localized edema: Secondary | ICD-10-CM | POA: Diagnosis not present

## 2014-09-01 DIAGNOSIS — Z6827 Body mass index (BMI) 27.0-27.9, adult: Secondary | ICD-10-CM | POA: Diagnosis not present

## 2014-09-06 DIAGNOSIS — H4922 Sixth [abducent] nerve palsy, left eye: Secondary | ICD-10-CM | POA: Diagnosis not present

## 2014-09-06 DIAGNOSIS — H3530 Unspecified macular degeneration: Secondary | ICD-10-CM | POA: Diagnosis not present

## 2014-09-06 DIAGNOSIS — H532 Diplopia: Secondary | ICD-10-CM | POA: Diagnosis not present

## 2014-09-06 DIAGNOSIS — D3132 Benign neoplasm of left choroid: Secondary | ICD-10-CM | POA: Diagnosis not present

## 2014-09-09 DIAGNOSIS — I10 Essential (primary) hypertension: Secondary | ICD-10-CM | POA: Diagnosis not present

## 2014-09-09 DIAGNOSIS — Z6826 Body mass index (BMI) 26.0-26.9, adult: Secondary | ICD-10-CM | POA: Diagnosis not present

## 2014-09-09 DIAGNOSIS — G2581 Restless legs syndrome: Secondary | ICD-10-CM | POA: Diagnosis not present

## 2014-09-28 ENCOUNTER — Ambulatory Visit: Payer: Medicare Other | Admitting: Adult Health

## 2014-10-11 ENCOUNTER — Ambulatory Visit: Payer: Medicare Other | Admitting: Adult Health

## 2014-10-14 DIAGNOSIS — R600 Localized edema: Secondary | ICD-10-CM | POA: Diagnosis not present

## 2014-10-14 DIAGNOSIS — Z9181 History of falling: Secondary | ICD-10-CM | POA: Diagnosis not present

## 2014-10-14 DIAGNOSIS — Z1389 Encounter for screening for other disorder: Secondary | ICD-10-CM | POA: Diagnosis not present

## 2014-10-14 DIAGNOSIS — G2581 Restless legs syndrome: Secondary | ICD-10-CM | POA: Diagnosis not present

## 2014-10-14 DIAGNOSIS — I10 Essential (primary) hypertension: Secondary | ICD-10-CM | POA: Diagnosis not present

## 2014-12-12 DIAGNOSIS — M7541 Impingement syndrome of right shoulder: Secondary | ICD-10-CM | POA: Diagnosis not present

## 2014-12-28 ENCOUNTER — Other Ambulatory Visit: Payer: Self-pay | Admitting: Neurology

## 2014-12-28 NOTE — Telephone Encounter (Signed)
Last OV note says: I will try Lyrica instead of gabapentin

## 2014-12-29 ENCOUNTER — Other Ambulatory Visit: Payer: Self-pay | Admitting: Neurology

## 2014-12-29 NOTE — Telephone Encounter (Signed)
Last OV note says: I will try Lyrica instead of gabapentin

## 2014-12-30 DIAGNOSIS — J029 Acute pharyngitis, unspecified: Secondary | ICD-10-CM | POA: Diagnosis not present

## 2014-12-30 DIAGNOSIS — G2581 Restless legs syndrome: Secondary | ICD-10-CM | POA: Diagnosis not present

## 2014-12-30 DIAGNOSIS — Z6826 Body mass index (BMI) 26.0-26.9, adult: Secondary | ICD-10-CM | POA: Diagnosis not present

## 2015-01-09 ENCOUNTER — Other Ambulatory Visit: Payer: Self-pay | Admitting: Neurology

## 2015-01-09 DIAGNOSIS — M19011 Primary osteoarthritis, right shoulder: Secondary | ICD-10-CM | POA: Diagnosis not present

## 2015-01-09 DIAGNOSIS — M7541 Impingement syndrome of right shoulder: Secondary | ICD-10-CM | POA: Diagnosis not present

## 2015-01-09 DIAGNOSIS — T63441A Toxic effect of venom of bees, accidental (unintentional), initial encounter: Secondary | ICD-10-CM | POA: Diagnosis not present

## 2015-01-09 NOTE — Telephone Encounter (Signed)
Last OV note says: I will try Lyrica instead of gabapentin

## 2015-01-10 ENCOUNTER — Other Ambulatory Visit: Payer: Self-pay | Admitting: Neurology

## 2015-01-10 NOTE — Telephone Encounter (Signed)
Ward Givens, NP at 08/31/2014 9:14 AM     Status: Signed       Expand All Collapse All   I called the patient. He states that he did not get any benefit with the lyrica. He increased the dose to 100 mg BID but still did not notice any improvement. He continues to have burning and tingling in the lower extremities that mainly effects him at night. He would like to try something else. He has tried gabapentin in the past without benefit. I will try Cymbalta. He will begin taking 30 mg 1 tablet at bedtime for 1 week. If he is tolerating it well then we will increase to 2 tablet at bedtime. I reviewed the side effects with the patient. He verbalized understanding.

## 2015-01-15 ENCOUNTER — Other Ambulatory Visit: Payer: Self-pay | Admitting: Neurology

## 2015-01-15 NOTE — Telephone Encounter (Signed)
Ward Givens, NP at 08/31/2014 9:14 AM     Status: Signed       Expand All Collapse All  I called the patient. He states that he did not get any benefit with the lyrica. He increased the dose to 100 mg BID but still did not notice any improvement. He continues to have burning and tingling in the lower extremities that mainly effects him at night. He would like to try something else. He has tried gabapentin in the past without benefit. I will try Cymbalta. He will begin taking 30 mg 1 tablet at bedtime for 1 week. If he is tolerating it well then we will increase to 2 tablet at bedtime. I reviewed the side effects with the patient. He verbalized understanding.

## 2015-01-17 ENCOUNTER — Other Ambulatory Visit: Payer: Self-pay | Admitting: Neurology

## 2015-01-17 ENCOUNTER — Telehealth: Payer: Self-pay | Admitting: Neurology

## 2015-01-17 NOTE — Telephone Encounter (Signed)
Dr. Brett Fairy, do you have any suggestions for how to treat his RLS. He has tried multiple medications in the past without benefit. Currently on Requip. His ferritin and iron levels have been normal in the past. He is on an iron supplement. Does he qualify for your research trial.

## 2015-01-17 NOTE — Telephone Encounter (Signed)
I remember that release or Horizon the extended release forms of gabapentin seemed to give him relief but there was a cost issue and since he is on Medicare this could be a significant setback for him. I will forward his name to [place], but I think that this severity is actually an exclusion criterium.

## 2015-01-17 NOTE — Telephone Encounter (Signed)
I called back.  Patient says he does not feel Cymbalta was helpful, so he stopped the medication, and would either like to go back on Gabapentin, or try something else.  Please advise.  Thank you.

## 2015-01-17 NOTE — Telephone Encounter (Signed)
Patient called stating he is out of gabapentin.[facility] said [facility] but has not heard back from the office. Please call and advise. Patient can be reached at [phone].

## 2015-01-18 NOTE — Telephone Encounter (Signed)
Brandon Mason, can you call the patient. Ask if he has ever tried gabapentin ER? If not and he is amendable to this we could try that? If not and price is a factor we can start in back on gabapentin 600 mg at bedtime daily and at 3 AM PRN. Also let the patient know that we have a RLS research trial and Dr. Brett Fairy is checking to see if he meets criteria to be enrolled. Thanks!

## 2015-01-18 NOTE — Telephone Encounter (Signed)
I called back.  Spoke with patient.  Relayed providers message.  He verbalized understanding and was agreeable to this plan.  Says he would like to proceed with regular Gabapentin at this time instead of ER due to cost.  He will call us back if anything further is needed.

## 2015-01-20 DIAGNOSIS — G4710 Hypersomnia, unspecified: Secondary | ICD-10-CM | POA: Diagnosis not present

## 2015-01-20 DIAGNOSIS — G629 Polyneuropathy, unspecified: Secondary | ICD-10-CM | POA: Diagnosis not present

## 2015-01-20 DIAGNOSIS — G2581 Restless legs syndrome: Secondary | ICD-10-CM | POA: Diagnosis not present

## 2015-01-20 DIAGNOSIS — F329 Major depressive disorder, single episode, unspecified: Secondary | ICD-10-CM | POA: Diagnosis not present

## 2015-01-31 DIAGNOSIS — G4733 Obstructive sleep apnea (adult) (pediatric): Secondary | ICD-10-CM | POA: Diagnosis not present

## 2015-01-31 DIAGNOSIS — R0902 Hypoxemia: Secondary | ICD-10-CM | POA: Diagnosis not present

## 2015-02-14 DIAGNOSIS — G2581 Restless legs syndrome: Secondary | ICD-10-CM | POA: Diagnosis not present

## 2015-02-14 DIAGNOSIS — J302 Other seasonal allergic rhinitis: Secondary | ICD-10-CM | POA: Diagnosis not present

## 2015-02-14 DIAGNOSIS — K219 Gastro-esophageal reflux disease without esophagitis: Secondary | ICD-10-CM | POA: Diagnosis not present

## 2015-02-14 DIAGNOSIS — G4733 Obstructive sleep apnea (adult) (pediatric): Secondary | ICD-10-CM | POA: Diagnosis not present

## 2015-02-21 DIAGNOSIS — G4733 Obstructive sleep apnea (adult) (pediatric): Secondary | ICD-10-CM | POA: Diagnosis not present

## 2015-02-21 DIAGNOSIS — R0902 Hypoxemia: Secondary | ICD-10-CM | POA: Diagnosis not present

## 2015-03-02 DIAGNOSIS — G4733 Obstructive sleep apnea (adult) (pediatric): Secondary | ICD-10-CM | POA: Diagnosis not present

## 2015-03-02 DIAGNOSIS — F329 Major depressive disorder, single episode, unspecified: Secondary | ICD-10-CM | POA: Diagnosis not present

## 2015-03-02 DIAGNOSIS — K219 Gastro-esophageal reflux disease without esophagitis: Secondary | ICD-10-CM | POA: Diagnosis not present

## 2015-03-02 DIAGNOSIS — I10 Essential (primary) hypertension: Secondary | ICD-10-CM | POA: Diagnosis not present

## 2015-03-07 DIAGNOSIS — G4733 Obstructive sleep apnea (adult) (pediatric): Secondary | ICD-10-CM | POA: Diagnosis not present

## 2015-04-12 DIAGNOSIS — E785 Hyperlipidemia, unspecified: Secondary | ICD-10-CM | POA: Diagnosis not present

## 2015-04-12 DIAGNOSIS — R0602 Shortness of breath: Secondary | ICD-10-CM | POA: Diagnosis not present

## 2015-04-12 DIAGNOSIS — R600 Localized edema: Secondary | ICD-10-CM | POA: Diagnosis not present

## 2015-05-24 DIAGNOSIS — Z8673 Personal history of transient ischemic attack (TIA), and cerebral infarction without residual deficits: Secondary | ICD-10-CM | POA: Diagnosis not present

## 2015-05-24 DIAGNOSIS — Z024 Encounter for examination for driving license: Secondary | ICD-10-CM | POA: Diagnosis not present

## 2015-05-24 DIAGNOSIS — N5201 Erectile dysfunction due to arterial insufficiency: Secondary | ICD-10-CM | POA: Diagnosis not present

## 2015-06-05 DIAGNOSIS — Z23 Encounter for immunization: Secondary | ICD-10-CM | POA: Diagnosis not present

## 2015-06-22 DIAGNOSIS — J00 Acute nasopharyngitis [common cold]: Secondary | ICD-10-CM | POA: Diagnosis not present

## 2015-06-22 DIAGNOSIS — Z6826 Body mass index (BMI) 26.0-26.9, adult: Secondary | ICD-10-CM | POA: Diagnosis not present

## 2015-06-22 DIAGNOSIS — J069 Acute upper respiratory infection, unspecified: Secondary | ICD-10-CM | POA: Diagnosis not present

## 2015-07-28 DIAGNOSIS — G2581 Restless legs syndrome: Secondary | ICD-10-CM | POA: Diagnosis not present

## 2015-07-28 DIAGNOSIS — I10 Essential (primary) hypertension: Secondary | ICD-10-CM | POA: Diagnosis not present

## 2015-07-28 DIAGNOSIS — G629 Polyneuropathy, unspecified: Secondary | ICD-10-CM | POA: Diagnosis not present

## 2015-07-28 DIAGNOSIS — G4733 Obstructive sleep apnea (adult) (pediatric): Secondary | ICD-10-CM | POA: Diagnosis not present

## 2015-08-17 DIAGNOSIS — R5383 Other fatigue: Secondary | ICD-10-CM | POA: Diagnosis not present

## 2015-08-17 DIAGNOSIS — R9431 Abnormal electrocardiogram [ECG] [EKG]: Secondary | ICD-10-CM | POA: Diagnosis not present

## 2015-08-17 DIAGNOSIS — R0609 Other forms of dyspnea: Secondary | ICD-10-CM | POA: Diagnosis not present

## 2015-08-17 DIAGNOSIS — Z6826 Body mass index (BMI) 26.0-26.9, adult: Secondary | ICD-10-CM | POA: Diagnosis not present

## 2015-08-17 DIAGNOSIS — E119 Type 2 diabetes mellitus without complications: Secondary | ICD-10-CM | POA: Diagnosis not present

## 2015-08-18 DIAGNOSIS — R0609 Other forms of dyspnea: Secondary | ICD-10-CM | POA: Diagnosis not present

## 2015-08-18 DIAGNOSIS — R9431 Abnormal electrocardiogram [ECG] [EKG]: Secondary | ICD-10-CM | POA: Diagnosis not present

## 2015-08-18 DIAGNOSIS — R5383 Other fatigue: Secondary | ICD-10-CM | POA: Diagnosis not present

## 2015-08-18 DIAGNOSIS — E119 Type 2 diabetes mellitus without complications: Secondary | ICD-10-CM | POA: Diagnosis not present

## 2015-09-06 DIAGNOSIS — J984 Other disorders of lung: Secondary | ICD-10-CM | POA: Diagnosis not present

## 2015-09-06 DIAGNOSIS — R0602 Shortness of breath: Secondary | ICD-10-CM | POA: Diagnosis not present

## 2015-09-06 DIAGNOSIS — R05 Cough: Secondary | ICD-10-CM | POA: Diagnosis not present

## 2015-09-06 DIAGNOSIS — I712 Thoracic aortic aneurysm, without rupture: Secondary | ICD-10-CM | POA: Diagnosis not present

## 2015-09-06 DIAGNOSIS — R918 Other nonspecific abnormal finding of lung field: Secondary | ICD-10-CM | POA: Diagnosis not present

## 2015-09-13 ENCOUNTER — Encounter: Payer: Self-pay | Admitting: Neurology

## 2015-09-13 ENCOUNTER — Other Ambulatory Visit: Payer: Medicare Other

## 2015-09-13 ENCOUNTER — Ambulatory Visit (INDEPENDENT_AMBULATORY_CARE_PROVIDER_SITE_OTHER): Payer: Medicare Other | Admitting: Neurology

## 2015-09-13 VITALS — BP 138/90 | HR 88 | Ht 68.0 in | Wt 183.0 lb

## 2015-09-13 DIAGNOSIS — E119 Type 2 diabetes mellitus without complications: Secondary | ICD-10-CM

## 2015-09-13 DIAGNOSIS — G2581 Restless legs syndrome: Secondary | ICD-10-CM

## 2015-09-13 DIAGNOSIS — G609 Hereditary and idiopathic neuropathy, unspecified: Secondary | ICD-10-CM | POA: Diagnosis not present

## 2015-09-13 MED ORDER — GABAPENTIN 600 MG PO TABS: 600.0000 mg | ORAL_TABLET | Freq: Three times a day (TID) | ORAL | Status: DC

## 2015-09-13 NOTE — Patient Instructions (Addendum)
1. Your provider has requested that you have labwork completed today. Please go to [facility] ([id number]) on the second floor of this building before leaving the office today. You do not need to check in. If you are not called within 15 minutes please check with the front desk.  2. Increase Gabapentin 600 mg to 1 tablet three times daily. [facility] [place]  3. We will send an order for physical therapy to [facility] in [facility]. They will call you directly to schedule an appt.

## 2015-09-13 NOTE — Progress Notes (Signed)
Brandon Mason was seen today in neurologic consultation at the request of Dr. Brett Fairy.  His PCP is ESCAJEDA, Delfino Lovett, MD.  The consultation is for the evaluation of RLS.  This is a second opinion only.  I reviewed Dr. Edwena Felty records.    The patient is a 82 y.o. year old male with a history of RLS.  The first symptom began 5+ years ago.   It has gotten worse over the last 6 months.  The patient states that he awakens in the AM and feels that he has muscle soreness in the calves and some in the arms and he is having cramping in the feet and calves at night.  He also has a crawling sensation and tingling sensation in the feet and legs to just above the knee.  It is better when he stands and walks and the more he moves it the better.  However, it did not used to involve the entire leg; it has "creeped" up there with time.   Higher dosages of requip may be a little more efficacious but caused EDS (4 mg bid) and at lower dosages of requip (2 mg bid) that hasn't been as helpful but he is still very sleepy.  He literally has attacks of sleep.  He is on neurontin 600 mg, and takes 2 of them at night and it does help the tingling.  He doesn't think that makes him too sleepy.  He has never tried that during the day.  He was on a patch (? Neupro) and it didn't help.  He thinks that he tried mirapex.  He doesn't remember trying klonopin.  Tramadol was listed as being tried but he doesn't remember that.  He has tried levodopa without success as well.    He did have a "stroke" in may.  States that he had transient left sided weakness.    09/13/15 update:  The patient is following up today.  I have not seen him in over one year, at which point I was just providing a second opinion for Dr. Brett Fairy.  At that point, I thought that it was not only restless leg causing his symptoms, but also a component of peripheral neuropathy.  I had recommended that he had daytime gabapentin or change to Lyrica or Cymbalta.  He had an  EMG done on 08/18/2014 by Dr. Jaynee Eagles and it was reported to show both acute and chronic denervation and this was felt consistent with axonal peripheral neuropathy as well as a chronic right lumbar radiculopathy.  He ultimately changed his gabapentin to Lyrica and worked up to 100 mg twice a day and ended up stopping it, stating that it did not help.  It does not appear that he was on this long.  It was subsequently changed to Cymbalta and that did not help and he changed back to gabapentin.  He remains on ropinirole for restless leg.  States that he comes today because of loss of balance and continued paresthesias but the paresthesias are now "all day long."  States that he is on gabapentin, 600mg , 2 in the AM and then nothing else the rest of the day.  He takes requip 2 mg, tid.  He doesn't think that the requip is helping.  States that over the last 3-4 months he is having burning paresthesias up to his knees and "it doesn't stop."  It is day and night.  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  He has not done PT since his stroke nearly 2 years ago.    PREVIOUS MEDICATIONS: mirapex, requip (on now, higher dosages with sleep attacks), levodopa, neurontin at bedtime only, ?neupro, ?tramadol  ALLERGIES:  No Known Allergies  CURRENT MEDICATIONS:  Outpatient Encounter Prescriptions as of 09/13/2015  Medication Sig  . aspirin 325 MG tablet Take 325 mg by mouth daily.  . ferrous fumarate (HEMOCYTE - 106 MG FE) 325 (106 FE) MG TABS tablet Take 1 tablet by mouth daily.  . furosemide (LASIX) 40 MG tablet Take 40 mg by mouth.  . gabapentin (NEURONTIN) 600 MG tablet TAKE 1 TABLET TWICE A DAY *AT BEDTIME AND AT 3AM*  . lisinopril (PRINIVIL,ZESTRIL) 20 MG tablet Take 20 mg by mouth.  Marland Kitchen rOPINIRole (REQUIP) 2 MG tablet Take 1 tablet (2 mg total) by mouth 3 (three) times daily.  . vitamin B-12 (CYANOCOBALAMIN) 1000 MCG tablet Take  1,000 mcg by mouth daily.  . [DISCONTINUED] BENICAR HCT 40-12.5 MG per tablet Take 0.5 tablets by mouth daily.   . [DISCONTINUED] DULoxetine (CYMBALTA) 30 MG capsule Take 1 tablet at bedtime for 1 week then increase to 2 tablets at bedtime thereafter.   No facility-administered encounter medications on file as of 09/13/2015.    PAST MEDICAL HISTORY:   Past Medical History  Diagnosis Date  . Hypertension   . Shortness of breath     with exertion   . Arthritis   . Prostate disorder   . Cardiomegaly   . Chronic kidney disease     BPH  . Neuromuscular disorder (HCC)     restless leg syndrome   . Hypoxia     pulmonary  . Nocturia   . Hypersomnia   . Chronic leg pain   . RLS (restless legs syndrome)     x 40 years  . Abnormality of gait 02/09/2013  . Restless legs syndrome (RLS) 02/09/2013  . Restless legs syndrome with nocturnal myoclonus   . UARS (upper airway resistance syndrome) 06/28/2014    PAST SURGICAL HISTORY:   Past Surgical History  Procedure Laterality Date  . Hernia repair    . Back surgery  2003    L4-5  . Transurethral resection of prostate  03/24/2012    Procedure: TRANSURETHRAL RESECTION OF THE PROSTATE (TURP);  Surgeon: Fredricka Bonine, MD;  Location: [facility];  Service: Urology;  Laterality: N/A;  with gyrus ,Greenlight PVP laser of Prostate  . Transurethral prostatectomy with gyrus instruments  04/28/2012    Procedure: TRANSURETHRAL PROSTATECTOMY WITH GYRUS INSTRUMENTS;  Surgeon: Fredricka Bonine, MD;  Location: [facility];  Service: Urology;  Laterality: N/A;  . Cataract extraction      SOCIAL HISTORY:   Social History   Social History  . Marital Status: Married    Spouse Name: [name]  . Number of Children: 5  . Years of Education: 12   Occupational History  . retired      [name] for a [employer]  . Smoking status: Never Smoker   . Smokeless tobacco: Never Used  . Alcohol Use: 1.2 oz/week    2 Cans of  beer per week     Comment: 2 cans of beer per week   . Drug Use: No  . Sexual Activity: Not on file   Other Topics Concern  . Not on file   Social History Narrative   Patient lives at home  with his wife [name].   Patient is retired.    Patient has 5 children.   Patient has a high school education.   Patient is right-handed.   Patient drinks very little caffeine.    FAMILY HISTORY:   Family Status  Relation Status Death Age  . Mother Deceased 74    heart problems, RLS  . Father Deceased 52    heart problems  . Daughter Alive     RLS, healthy  . Daughter Alive     healthy  . Son Alive     healthy  . Son Alive     healthy  . Son Alive     healthy    ROS:  A complete 10 system review of systems was obtained and was unremarkable apart from what is mentioned above.  PHYSICAL EXAMINATION:    VITALS:   Filed Vitals:   09/13/15 0823  BP: 138/90  Pulse: 88  Height: 5\' 8"  (1.727 m)  Weight: 183 lb (83.008 kg)    GEN:  Normal appears male in no acute distress.  Appears stated age. HEENT:  Normocephalic, atraumatic. The mucous membranes are moist. The superficial temporal arteries are without ropiness or tenderness. Cardiovascular: Regular rate and rhythm. Lungs: Clear to auscultation bilaterally. Neck/Heme: There are no carotid bruits noted bilaterally.  NEUROLOGICAL: Orientation:  The patient is alert and oriented x 3.   Cranial nerves: There is good facial symmetry.   Speech is fluent and clear. Soft palate rises symmetrically and there is no tongue deviation. Hearing is intact to conversational tone. Tone: Tone is good throughout. Sensation: Sensation is intact to light touch and pinprick throughout (facial, trunk, extremities). Pinprick is not decreased in a stocking distribution today, although pt stated was last visit.  Vibration is markedly decreased in a distal fashion. There is no extinction with double simultaneous stimulation. There is no sensory dermatomal  level identified. Coordination:  The patient has no difficulty with RAM's or FNF bilaterally. Motor: Strength is 5/5 in the bilateral upper and lower extremities.  Shoulder shrug is equal and symmetric. There is no pronator drift.  There are no fasciculations noted. DTR's: Deep tendon reflexes are 2+/4 at the bilateral biceps, triceps, brachioradialis, patella and 1/4 at the bilateral achilles (denies neck pain).  Plantar responses are downgoing bilaterally. Gait and Station: The patient is unsteady and wide based.  He has significant difficulty ambulating in a tandem fashion.  Able to stand in romberg position with eyes open but not with eyes closed.   IMPRESSION/PLAN  1. Paresthesias  -While i do think that the patient has long-standing RLS, I think that this is really not an issue now at all and even though he calls this RLS, I think that the symptoms are really from peripheral neuropathy.  He denies DM, but I pulled up his old records from his PCP and it indicates that the patient has DM and he took himself off his meds.    -will refer to PT for balance therapy  -will change gabapentin from 600mg - 2 po q AM to 1 po tid.  May need to use in combo therapy in the future.  Has tried lyrica and cymbalta in the past but not for long and never in combo.  If I am unsuccessful, I may refer him to Dr. Posey Pronto as he is very depressed over sx's and convinced he doesn't want to live like this (not suicidal but feels hopeless)  -get labs to r/o reversible causes, b12 (on  supplement per pt), folate, rpr, spep/upep, hgbA1C (pt denies DM but records from PCP indicates has DM and took self off med)  -told pt that I don't think that RLS big issue now and that requip may even be causing augmentation and may need to try and back down on that in future but didn't want to change too many things at one time.  He was willing to taper when we decie to do that.  Asked him to call me in few weeks and can adjust things on phone  before he f/u in few months.  -discussed safety in detail 2.  Follow up is anticipated in the next few months, sooner should new neurologic issues arise.  Much greater than 50% of this visit was spent in counseling with the patient.  Total face to face time:  40 min

## 2015-09-14 LAB — FOLATE: Folate: >24 ng/mL (ref >5.4)

## 2015-09-14 LAB — RPR TITER

## 2015-09-14 LAB — HEMOGLOBIN A1C
Hgb A1c MFr Bld: 6 % — ABNORMAL HIGH (ref <5.7)
MEAN PLASMA GLUCOSE: 126 mg/dL — AB (ref <117)

## 2015-09-14 LAB — RPR: RPR: REACTIVE — AB

## 2015-09-14 LAB — VITAMIN B12: VITAMIN B 12: 597 pg/mL (ref 200–1100)

## 2015-09-15 ENCOUNTER — Telehealth: Payer: Self-pay | Admitting: Neurology

## 2015-09-15 LAB — IMMUNOFIXATION ELECTROPHORESIS
IGA: 244 mg/dL (ref 68–379)
IGG (IMMUNOGLOBIN G), SERUM: 1220 mg/dL (ref 650–1600)
IgM, Serum: 59 mg/dL (ref 41–251)

## 2015-09-15 LAB — PROTEIN ELECTROPHORESIS, SERUM
ALPHA-2-GLOBULIN: 0.7 g/dL (ref 0.5–0.9)
Albumin ELP: 3.7 g/dL — ABNORMAL LOW (ref 3.8–4.8)
Alpha-1-Globulin: 0.4 g/dL — ABNORMAL HIGH (ref 0.2–0.3)
BETA 2: 0.3 g/dL (ref 0.2–0.5)
Beta Globulin: 0.4 g/dL (ref 0.4–0.6)
GAMMA GLOBULIN: 1 g/dL (ref 0.8–1.7)
Total Protein, Serum Electrophoresis: 6.5 g/dL (ref 6.1–8.1)

## 2015-09-15 LAB — PROTEIN ELECTROPHORESIS,RANDOM URN
CREATININE, URINE: 115 mg/dL (ref 20–370)
Protein Creatinine Ratio: 139 mg/g creat — ABNORMAL HIGH (ref 22–128)
TOTAL PROTEIN, URINE: 16 mg/dL (ref 5–25)

## 2015-09-15 LAB — FLUORESCENT TREPONEMAL AB(FTA)-IGG-BLD: Fluorescent Treponemal ABS: NONREACTIVE

## 2015-09-15 NOTE — Telephone Encounter (Signed)
PT called and wants his blood test results/Dawn CB# [phone]

## 2015-09-15 NOTE — Telephone Encounter (Signed)
Patient aware no results available yet. We will call him when they are resulted.

## 2015-09-18 LAB — IMMUNOFIXATION INTE

## 2015-09-20 ENCOUNTER — Encounter: Payer: Self-pay | Admitting: Physical Therapy

## 2015-09-20 ENCOUNTER — Ambulatory Visit: Payer: Medicare Other | Attending: Neurology | Admitting: Physical Therapy

## 2015-09-20 DIAGNOSIS — R29898 Other symptoms and signs involving the musculoskeletal system: Secondary | ICD-10-CM

## 2015-09-20 DIAGNOSIS — Z7409 Other reduced mobility: Secondary | ICD-10-CM

## 2015-09-20 DIAGNOSIS — R269 Unspecified abnormalities of gait and mobility: Secondary | ICD-10-CM | POA: Diagnosis not present

## 2015-09-20 DIAGNOSIS — M6289 Other specified disorders of muscle: Secondary | ICD-10-CM | POA: Insufficient documentation

## 2015-09-20 NOTE — Therapy (Signed)
[facility] [address]  [place], [place], [address] Phone: [phone]   Fax:  [phone]  Physical Therapy Evaluation  Patient Details  Name: Brandon Mason MRN: [id number] Date of Birth: Mar 12, 1926 Referring Provider: Alonza Bogus, DO  Encounter Date: 09/20/2015      PT End of Session - 09/20/15 0808    Visit Number 1   Number of Visits 12   Date for PT Re-Evaluation 11/01/15   PT Start Time 0804   PT Stop Time 0852   PT Time Calculation (min) 48 min      Past Medical History  Diagnosis Date  . Hypertension   . Shortness of breath     with exertion   . Arthritis   . Prostate disorder   . Cardiomegaly   . Chronic kidney disease     BPH  . Neuromuscular disorder (HCC)     restless leg syndrome   . Hypoxia     pulmonary  . Nocturia   . Hypersomnia   . Chronic leg pain   . RLS (restless legs syndrome)     x 40 years  . Abnormality of gait 02/09/2013  . Restless legs syndrome (RLS) 02/09/2013  . Restless legs syndrome with nocturnal myoclonus   . UARS (upper airway resistance syndrome) 06/28/2014    Past Surgical History  Procedure Laterality Date  . Hernia repair    . Back surgery  2003    L4-5  . Transurethral resection of prostate  03/24/2012    Procedure: TRANSURETHRAL RESECTION OF THE PROSTATE (TURP);  Surgeon: Fredricka Bonine, MD;  Location: [facility];  Service: Urology;  Laterality: N/A;  with gyrus ,Greenlight PVP laser of Prostate  . Transurethral prostatectomy with gyrus instruments  04/28/2012    Procedure: TRANSURETHRAL PROSTATECTOMY WITH GYRUS INSTRUMENTS;  Surgeon: Fredricka Bonine, MD;  Location: [facility];  Service: Urology;  Laterality: N/A;  . Cataract extraction      There were no vitals filed for this visit.  Visit Diagnosis:  Abnormality of gait  Impaired functional mobility, balance, gait, and endurance  Weakness of both hips      Subjective Assessment -  09/20/15 0814    Subjective Pt with c/o worsening gait mechanics / balance over the past 3-4 months.  He states he was member of [facility] and noticed that he had trouble marching in a straight line. He denies falls just c/o difficulty walking in a straight line and bumps into things while walking.   Patient Stated Goals wants to get back to participating in [facility]   Currently in Pain? No/denies            [facility] PT Assessment - 09/20/15 0001    Assessment   Medical Diagnosis peripheral neuropathy, impaired balance   Referring Provider Wells Guiles Tat, DO   Onset Date/Surgical Date 06/15/15   Balance Screen   Has the patient fallen in the past 6 months No   Has the patient had a decrease in activity level because of a fear of falling?  Yes   Is the patient reluctant to leave their home because of a fear of falling?  No   Home Environment   Living Environment Private residence   Living Arrangements Spouse/significant other   Type of [place] to enter   Entrance Stairs-Number of Steps 2   [place] Two level   Prior Function  Vocation Retired   Leisure play cards, denies exercise   Observation/Other Assessments   Focus on Therapeutic Outcomes (FOTO)  40% limitation   ROM / Strength   AROM / PROM / Strength Strength   Strength   Strength Assessment Site Hip;Knee;Ankle   Right/Left Hip Right;Left   Right Hip Flexion 5/5   Right Hip Extension 4/5   Right Hip External Rotation  3+/5   Right Hip Internal Rotation 4+/5   Right Hip ABduction 4/5   Right Hip ADduction 4+/5   Left Hip Flexion 4+/5   Left Hip Extension 4-/5   Left Hip External Rotation 4/5   Left Hip Internal Rotation 4+/5   Left Hip ABduction 4/5   Left Hip ADduction 4+/5   Right/Left Knee Right;Left   Right Knee Flexion 5/5   Right Knee Extension 5/5   Left Knee Flexion 5/5   Left Knee Extension 5/5   Right/Left Ankle --   Right Ankle Dorsiflexion  5/5   Right Ankle Plantar Flexion 5/5   Left Ankle Dorsiflexion 5/5   Left Ankle Plantar Flexion 5/5   Standardized Balance Assessment   Standardized Balance Assessment Berg Balance Test   Berg Balance Test   Sit to Stand Able to stand without using hands and stabilize independently   Standing Unsupported Able to stand safely 2 minutes   Sitting with Back Unsupported but Feet Supported on Floor or Stool Able to sit safely and securely 2 minutes   Stand to Sit Sits safely with minimal use of hands   Transfers Able to transfer safely, minor use of hands   Standing Unsupported with Eyes Closed Able to stand 10 seconds with supervision   Standing Ubsupported with Feet Together Able to place feet together independently and stand for 1 minute with supervision   From Standing, Reach Forward with Outstretched Arm Can reach forward >12 cm safely (5")   From Standing Position, Pick up Object from [place] to pick up shoe safely and easily   From Standing Position, Turn to Look Behind Over each Shoulder Looks behind one side only/other side shows less weight shift  good to L, difficult to R   Turn 360 Degrees Able to turn 360 degrees safely but slowly   Standing Unsupported, Alternately Place Feet on Step/Stool Able to complete 4 steps without aid or supervision  4 steps in less than 20" but unsteady   Standing Unsupported, One Foot in Front Able to plae foot ahead of the other independently and hold 30 seconds   Standing on One Leg Able to lift leg independently and hold equal to or more than 3 seconds  approx 2" each   Total Score 45       TODAY'S TREATMENT TherEx - Minisquat at counter 10x SL Heel Raise 20x each DL Heel Raise 20x            PT Education - 09/20/15 0854    Education provided Yes   Education Details initial HEP   Person(s) Educated Patient   Methods Explanation;Demonstration;Handout   Comprehension Verbalized understanding;Returned demonstration           PT Short Term Goals - 09/20/15 0912    PT SHORT TERM GOAL #1   Title pt independent with initial HEP by 10/04/15   Status New           PT Long Term Goals - 09/20/15 0914    PT LONG TERM GOAL #1   Title pt independent with advanced HEP as  necessary by 11/01/15   Status New   PT LONG TERM GOAL #2   Title B LE MMT 4+/5 or better by 11/01/15   Status New   PT LONG TERM GOAL #3   Title pt scores 50/56 or better on Berg by 11/01/15   Status New   PT LONG TERM GOAL #4   Title pt able to return to participation with [facility] by 11/01/15   Status New               Plan - 09-27-15 [id number]    Clinical Impression Statement pt sent to OPPT with diagnosis of peripheral neuropathy and impaired gait/balance.  He has been active with [facility] for past 2 years but has stopped participating in the past 5-6 wks due to worsening gait and balance.  He states he is having difficulty walking in straight lines and maintaining balance.  He denies falls.  Today's assessment reveals some mild weakness in B hips (L slightly worse), impaired balance (Berg 45/56), and impaired gait mechanics (slow, short B stride, wide BOS).  Pt fatigues quickly and noted mild SOB during portions of balance assessment today.  He seems motivated to return to [employer] participation and he should progress well with PT and get back to this within the next 6 wks.   Pt will benefit from skilled therapeutic intervention in order to improve on the following deficits Postural dysfunction;Difficulty walking;Abnormal gait;Decreased balance;Decreased strength   Rehab Potential Good   Clinical Impairments Affecting Rehab Potential age, peripheral neuropathy, rapid fatigue/SOB   PT Frequency 2x / week   PT Duration 6 weeks   PT Treatment/Interventions Therapeutic exercise;Gait training;Balance training;Therapeutic activities;Functional mobility training;Stair training   PT Next Visit Plan balance training,  gait training, edurance and strengthening to tolerance, HEP progressions as able   Consulted and Agree with Plan of Care Patient          G-Codes - 09/27/15 0811    Functional Assessment Tool Used foto 40% limitation   Functional Limitation Mobility: Walking and moving around   Mobility: Walking and Moving Around Current Status [phone]) At least 20 percent but less than 40 percent impaired, limited or restricted   Mobility: Walking and Moving Around Goal Status [phone]) At least 20 percent but less than 40 percent impaired, limited or restricted       Problem List Patient Active Problem List   Diagnosis Date Noted  . UARS (upper airway resistance syndrome) 06/28/2014  . Restless leg syndrome, familial, uncontrolled 08/20/2013  . Restless legs syndrome with nocturnal myoclonus   . Abnormality of gait 02/09/2013  . Restless legs syndrome (RLS) 02/09/2013  . Acute kidney injury ([place]) 03/27/2012    Daryle Amis PT, OCS 27-Sep-2015, 1:00 PM  [facility] [address]  [place], [place], [address] Phone: [phone]   Fax:  [phone]  Name: Brandon Mason MRN: [id number] Date of Birth: 1925/08/21

## 2015-09-26 ENCOUNTER — Ambulatory Visit: Payer: Medicare Other | Admitting: Physical Therapy

## 2015-09-26 ENCOUNTER — Encounter: Payer: Self-pay | Admitting: Physical Therapy

## 2015-09-26 DIAGNOSIS — Z7409 Other reduced mobility: Secondary | ICD-10-CM

## 2015-09-26 DIAGNOSIS — M6289 Other specified disorders of muscle: Secondary | ICD-10-CM | POA: Diagnosis not present

## 2015-09-26 DIAGNOSIS — R269 Unspecified abnormalities of gait and mobility: Secondary | ICD-10-CM | POA: Diagnosis not present

## 2015-09-26 DIAGNOSIS — R29898 Other symptoms and signs involving the musculoskeletal system: Secondary | ICD-10-CM

## 2015-09-26 NOTE — Therapy (Signed)
[facility] [address]  [place], [place], [address] Phone: [phone]   Fax:  [phone]  Physical Therapy Treatment  Patient Details  Name: Brandon Mason MRN: [id number] Date of Birth: April 14, 1926 Referring Provider: Alonza Bogus, DO  Encounter Date: 09/26/2015      PT End of Session - 09/26/15 0832    Visit Number 2   Number of Visits 12   Date for PT Re-Evaluation 11/01/15   PT Start Time [id number]   PT Stop Time 0841   PT Time Calculation (min) 44 min   Activity Tolerance Patient tolerated treatment well;Patient limited by fatigue   Behavior During Therapy [facility] for tasks assessed/performed      Past Medical History  Diagnosis Date  . Hypertension   . Shortness of breath     with exertion   . Arthritis   . Prostate disorder   . Cardiomegaly   . Chronic kidney disease     BPH  . Neuromuscular disorder (HCC)     restless leg syndrome   . Hypoxia     pulmonary  . Nocturia   . Hypersomnia   . Chronic leg pain   . RLS (restless legs syndrome)     x 40 years  . Abnormality of gait 02/09/2013  . Restless legs syndrome (RLS) 02/09/2013  . Restless legs syndrome with nocturnal myoclonus   . UARS (upper airway resistance syndrome) 06/28/2014    Past Surgical History  Procedure Laterality Date  . Hernia repair    . Back surgery  2003    L4-5  . Transurethral resection of prostate  03/24/2012    Procedure: TRANSURETHRAL RESECTION OF THE PROSTATE (TURP);  Surgeon: Fredricka Bonine, MD;  Location: [facility];  Service: Urology;  Laterality: N/A;  with gyrus ,Greenlight PVP laser of Prostate  . Transurethral prostatectomy with gyrus instruments  04/28/2012    Procedure: TRANSURETHRAL PROSTATECTOMY WITH GYRUS INSTRUMENTS;  Surgeon: Fredricka Bonine, MD;  Location: [facility];  Service: Urology;  Laterality: N/A;  . Cataract extraction      There were no vitals filed for this visit.  Visit Diagnosis:   Abnormality of gait  Impaired functional mobility, balance, gait, and endurance  Weakness of both hips      Subjective Assessment - 09/26/15 0800    Subjective Patient denies falls since last treatment, but continues to report unsteady gait.   Currently in Pain? No/denies                         [facility] Adult PT Treatment/Exercise - 09/26/15 0001    High Level Balance   High Level Balance Activities Side stepping;Tandem walking   High Level Balance Comments narrow BOS, tandem stance, head turns, eyes closed, on airex balance activities, ball toss, and ball kicks, toe walking, heel walking   Exercises   Exercises Knee/Hip   Knee/Hip Exercises: Aerobic   Nustep level 5 x 6 minutes   Knee/Hip Exercises: Machines for Strengthening   Cybex Knee Extension 10# 2x15   Cybex Knee Flexion 25# 2x15   Other Machine standing hip flexion and abduction 4# ankle weights 2x10 each   Knee/Hip Exercises: Standing   Other Standing Knee Exercises 8" toe clears forward and side/side   Knee/Hip Exercises: Seated   Sit to Sand 10 reps;without UE support                PT Education - 09/26/15  0838    Education provided Yes   Education Details gave home balance HEP   Person(s) Educated Patient   Methods Explanation;Demonstration;Handout   Comprehension Verbalized understanding;Returned demonstration          PT Short Term Goals - 09/20/15 0912    PT SHORT TERM GOAL #1   Title pt independent with initial HEP by 10/04/15   Status New           PT Long Term Goals - 09/20/15 0914    PT LONG TERM GOAL #1   Title pt independent with advanced HEP as necessary by 11/01/15   Status New   PT LONG TERM GOAL #2   Title B LE MMT 4+/5 or better by 11/01/15   Status New   PT LONG TERM GOAL #3   Title pt scores 50/56 or better on Berg by 11/01/15   Status New   PT LONG TERM GOAL #4   Title pt able to return to participation with [facility] by 11/01/15   Status New                Plan - 09/26/15 [id number]    Clinical Impression Statement Patient gives very good effort and is motivated by saying he does want to return to the [employer].  Had some SOB with activity, did well with all balance activities, some LOB on higher level activity   PT Next Visit Plan balance training, gait training, edurance and strengthening to tolerance, HEP progressions as able   Consulted and Agree with Plan of Care Patient        Problem List Patient Active Problem List   Diagnosis Date Noted  . UARS (upper airway resistance syndrome) 06/28/2014  . Restless leg syndrome, familial, uncontrolled 08/20/2013  . Restless legs syndrome with nocturnal myoclonus   . Abnormality of gait 02/09/2013  . Restless legs syndrome (RLS) 02/09/2013  . Acute kidney injury ([place]) 03/27/2012    Sumner Boast., PT 09/26/2015, 8:40 AM  [facility] [address]  [place], [place], [address] Phone: [phone]   Fax:  [phone]  Name: Jasan Potempa MRN: [id number] Date of Birth: 1925-12-14

## 2015-09-29 ENCOUNTER — Ambulatory Visit: Payer: Medicare Other | Admitting: Physical Therapy

## 2015-09-29 DIAGNOSIS — Z7409 Other reduced mobility: Secondary | ICD-10-CM | POA: Diagnosis not present

## 2015-09-29 DIAGNOSIS — R269 Unspecified abnormalities of gait and mobility: Secondary | ICD-10-CM

## 2015-09-29 DIAGNOSIS — M6289 Other specified disorders of muscle: Secondary | ICD-10-CM | POA: Diagnosis not present

## 2015-09-29 DIAGNOSIS — R29898 Other symptoms and signs involving the musculoskeletal system: Secondary | ICD-10-CM

## 2015-09-29 NOTE — Therapy (Signed)
[facility] [address]  [place], [place], [address] Phone: [phone]   Fax:  [phone]  Physical Therapy Treatment  Patient Details  Name: Brandon Mason MRN: [id number] Date of Birth: [birth year] Referring Provider: Alonza Bogus, DO  Encounter Date: 09/29/2015      PT End of Session - 09/29/15 1139    Visit Number 3   Number of Visits 12   Date for PT Re-Evaluation 11/01/15   PT Start Time 1105   PT Stop Time 1145   PT Time Calculation (min) 40 min   Activity Tolerance Patient tolerated treatment well   Behavior During Therapy [facility] for tasks assessed/performed      Past Medical History  Diagnosis Date   Hypertension    Shortness of breath     with exertion    Arthritis    Prostate disorder    Cardiomegaly    Chronic kidney disease     BPH   Neuromuscular disorder ([place])     restless leg syndrome    Hypoxia     pulmonary   Nocturia    Hypersomnia    Chronic leg pain    RLS (restless legs syndrome)     x 40 years   Abnormality of gait 02/09/2013   Restless legs syndrome (RLS) 02/09/2013   Restless legs syndrome with nocturnal myoclonus    UARS (upper airway resistance syndrome) 06/28/2014    Past Surgical History  Procedure Laterality Date   Hernia repair     Back surgery  2003    L4-5   Transurethral resection of prostate  03/24/2012    Procedure: TRANSURETHRAL RESECTION OF THE PROSTATE (TURP);  Surgeon: Fredricka Bonine, MD;  Location: [facility];  Service: Urology;  Laterality: N/A;  with gyrus ,Greenlight PVP laser of Prostate   Transurethral prostatectomy with gyrus instruments  04/28/2012    Procedure: TRANSURETHRAL PROSTATECTOMY WITH GYRUS INSTRUMENTS;  Surgeon: Fredricka Bonine, MD;  [facility]: [facility];  Service: Urology;  Laterality: N/A;   Cataract extraction      There were no vitals filed for this visit.  Visit Diagnosis:  Abnormality of  gait  Impaired functional mobility, balance, gait, and endurance  Weakness of both hips      Subjective Assessment - 09/29/15 1138    Subjective No falls to report.  States that he cont to feel a bit "wobbly" when up walking. "Feels like the Left leg is longer than the Right"   Patient Stated Goals wants to get back to participating in [facility]   Currently in Pain? No/denies       today's treatment; There/ex;  Nu step Lvl5 x6' Sit<->stand from low plinth without UE support 2x10 3 way standing SLR w/ green 1x10ea  BATCA knee ext 20# 2x12 BATCA Hamstring 30# 2x12 Bridge 2x12x5" Hooklying hip abd/er blue band 2x12ea b  Neuro re-ed; Rhomberg balance/ proprioception on airex pad, feet together with eyes open/ closed 5x20" ea Slow amplitude march at [employer] b Assisted single leg stance 5x5" ea bilateral w/ HHA                            PT Short Term Goals - 09/20/15 0912    PT SHORT TERM GOAL #1   Title pt independent with initial HEP by 10/04/15   Status New           PT Long  Term Goals - 09/20/15 0914    PT LONG TERM GOAL #1   Title pt independent with advanced HEP as necessary by 11/01/15   Status New   PT LONG TERM GOAL #2   Title B LE MMT 4+/5 or better by 11/01/15   Status New   PT LONG TERM GOAL #3   Title pt scores 50/56 or better on Berg by 11/01/15   Status New   PT LONG TERM GOAL #4   Title pt able to return to participation with [facility] by 11/01/15   Status New               Plan - 09/29/15 1139    Clinical Impression Statement Good response to Rhomberg balance/ proprioception with eyes closed.  Decreased lateral sway with gait following treatment.  Demos good strength with ther/ex        Problem List Patient Active Problem List   Diagnosis Date Noted   UARS (upper airway resistance syndrome) 06/28/2014   Restless leg syndrome, familial, uncontrolled 08/20/2013   Restless  legs syndrome with nocturnal myoclonus    Abnormality of gait 02/09/2013   Restless legs syndrome (RLS) 02/09/2013   Acute kidney injury ([place]) 03/27/2012    Olean Ree, PTA 09/29/2015, 11:52 AM  [facility] [address]  [place], [place], [address] Phone: [phone]   Fax:  [phone]  Name: Brandon Mason MRN: [id number] Date of Birth: 03/08/1926

## 2015-10-02 ENCOUNTER — Ambulatory Visit: Payer: Medicare Other | Attending: Neurology

## 2015-10-02 DIAGNOSIS — Z7409 Other reduced mobility: Secondary | ICD-10-CM | POA: Insufficient documentation

## 2015-10-02 DIAGNOSIS — R2689 Other abnormalities of gait and mobility: Secondary | ICD-10-CM | POA: Insufficient documentation

## 2015-10-02 DIAGNOSIS — R269 Unspecified abnormalities of gait and mobility: Secondary | ICD-10-CM | POA: Insufficient documentation

## 2015-10-02 DIAGNOSIS — M6289 Other specified disorders of muscle: Secondary | ICD-10-CM | POA: Insufficient documentation

## 2015-10-05 ENCOUNTER — Ambulatory Visit: Payer: Medicare Other | Admitting: Physical Therapy

## 2015-10-05 DIAGNOSIS — Z7409 Other reduced mobility: Secondary | ICD-10-CM | POA: Diagnosis not present

## 2015-10-05 DIAGNOSIS — R269 Unspecified abnormalities of gait and mobility: Secondary | ICD-10-CM | POA: Diagnosis not present

## 2015-10-05 DIAGNOSIS — R2689 Other abnormalities of gait and mobility: Secondary | ICD-10-CM

## 2015-10-05 DIAGNOSIS — M6289 Other specified disorders of muscle: Secondary | ICD-10-CM | POA: Diagnosis not present

## 2015-10-05 NOTE — Therapy (Signed)
[facility] [address]  [place], [place], [address] Phone: [phone]   Fax:  [phone]  Physical Therapy Treatment  Patient Details  Name: Brandon Mason MRN: [id number] Date of Birth: Nov 21, 1925 Referring Provider: Alonza Bogus, DO  Encounter Date: 10/05/2015      PT End of Session - 10/05/15 0802    Visit Number 4   Number of Visits 12   Date for PT Re-Evaluation 11/01/15   PT Start Time 0802   PT Stop Time 0846   PT Time Calculation (min) 44 min      Past Medical History  Diagnosis Date  . Hypertension   . Shortness of breath     with exertion   . Arthritis   . Prostate disorder   . Cardiomegaly   . Chronic kidney disease     BPH  . Neuromuscular disorder (H[facility])     restless leg syndrome   . Hypoxia     pulmonary  . Nocturia   . Hypersomnia   . Chronic leg pain   . RLS (restless legs syndrome)     x 40 years  . Abnormality of gait 02/09/2013  . Restless legs syndrome (RLS) 02/09/2013  . Restless legs syndrome with nocturnal myoclonus   . UARS (upper airway resistance syndrome) 06/28/2014    Past Surgical History  Procedure Laterality Date  . Hernia repair    . Back surgery  2003    L4-5  . Transurethral resection of prostate  03/24/2012    Procedure: TRANSURETHRAL RESECTION OF THE PROSTATE (TURP);  Surgeon: Fredricka Bonine, MD;  [facility];  Service: Urology;  Laterality: N/A;  with gyrus ,Greenlight PVP laser of Prostate  . Transurethral prostatectomy with gyrus instruments  04/28/2012    Procedure: TRANSURETHRAL PROSTATECTOMY WITH GYRUS INSTRUMENTS;  Surgeon: Fredricka Bonine, MD;  [facility];  Service: Urology;  Laterality: N/A;  . Cataract extraction      There were no vitals filed for this visit.  Visit Diagnosis:  Abnormality of gait  Impaired functional mobility, balance, gait, and endurance  Weakness of both hips     TODAY'S TREATMENT TherEx  - Bridge on Heels 15x B FOB (55cm) Tuck AROM and Bridge combo 10x Bridge with black TB at knees and unilateral Hip ABD 2x5 each TRX ALT Lunge 5x each  Neuro - BOSU (Up) FW step-up over 6" 1/2 FR with reach to cone on 36" FR 5x to same side then 5x to contra Foam Beam side-stepping 3 laps with SBA and intermittent MinA Foam Beam FW/BW tandem gait 3 laps with CGA Mini Tramp march in place 15x with CGA and pt intermittent using handle ALT lateral lunges with diagonal high and low reaching 5x each Toss/catch beach ball with diagonal lunges 2' Staggered Standing lawnmower pull Black TB 10x each with focus on wt shifting, trunk control, and preventing LOB 9" stool toe-tapping standing on foam pad 10x each 9" stool toe-tapping with one fool on blue foam disc other on floor 5x each  Gait training - Quick walking in halls with intermittent EC Walking in halls with quick obstacle avoidance             PT Short Term Goals - 10/05/15 0850    PT SHORT TERM GOAL #1   Title pt independent with initial HEP by 10/04/15   Status On-going           PT Long Term Goals -  10/05/15 0850    PT LONG TERM GOAL #1   Title pt independent with advanced HEP as necessary by 11/01/15   Status On-going   PT LONG TERM GOAL #2   Title B LE MMT 4+/5 or better by 11/01/15   Status On-going   PT LONG TERM GOAL #3   Title pt scores 50/56 or better on [place] by 11/01/15   Status On-going   PT LONG TERM GOAL #4   Title pt able to return to participation with [facility] by 11/01/15   Status On-going               Plan - 10/05/15 0848    Clinical Impression Statement excellent performance with treatment today.  Very few LOB even with rather difficult training.  He c/o continued difficulty walking in straight lines but I am becoming much more confident he will progress quite well and be able to get back to [employer] role.   PT Next Visit Plan balance training, gait training, edurance and  strengthening to tolerance, HEP progressions as able   Consulted and Agree with Plan of Care Patient        Problem List Patient Active Problem List   Diagnosis Date Noted  . UARS (upper airway resistance syndrome) 06/28/2014  . Restless leg syndrome, familial, uncontrolled 08/20/2013  . Restless legs syndrome with nocturnal myoclonus   . Abnormality of gait 02/09/2013  . Restless legs syndrome (RLS) 02/09/2013  . Acute kidney injury ([place]) 03/27/2012    Dorothey Oetken PT, OCS 10/05/2015, 8:50 AM  [facility] [address]  [place], [place], [address] Phone: [phone]   Fax:  [phone]  Name: Brandon Mason MRN: [id number] Date of Birth: May 25, 1926

## 2015-10-09 ENCOUNTER — Ambulatory Visit: Payer: Medicare Other | Admitting: Physical Therapy

## 2015-10-10 ENCOUNTER — Ambulatory Visit: Payer: Medicare Other | Admitting: Physical Therapy

## 2015-10-10 DIAGNOSIS — R2689 Other abnormalities of gait and mobility: Secondary | ICD-10-CM

## 2015-10-10 DIAGNOSIS — R269 Unspecified abnormalities of gait and mobility: Secondary | ICD-10-CM | POA: Diagnosis not present

## 2015-10-10 DIAGNOSIS — Z7409 Other reduced mobility: Secondary | ICD-10-CM | POA: Diagnosis not present

## 2015-10-10 DIAGNOSIS — M6289 Other specified disorders of muscle: Secondary | ICD-10-CM | POA: Diagnosis not present

## 2015-10-10 NOTE — Therapy (Signed)
[facility] [address]  [place], [place], [address] Phone: [phone]   Fax:  [phone]  Physical Therapy Treatment  Patient Details  Name: Brandon Mason MRN: [id number] Date of Birth: [birth year] Referring Provider: Alonza Bogus, DO  Encounter Date: 10/10/2015      PT End of Session - 10/10/15 0805    Visit Number 5   Number of Visits 12   Date for PT Re-Evaluation 11/01/15   PT Start Time 0804   PT Stop Time 0841   PT Time Calculation (min) 37 min      Past Medical History  Diagnosis Date  . Hypertension   . Shortness of breath     with exertion   . Arthritis   . Prostate disorder   . Cardiomegaly   . Chronic kidney disease     BPH  . Neuromuscular disorder (HCC)     restless leg syndrome   . Hypoxia     pulmonary  . Nocturia   . Hypersomnia   . Chronic leg pain   . RLS (restless legs syndrome)     x 40 years  . Abnormality of gait 02/09/2013  . Restless legs syndrome (RLS) 02/09/2013  . Restless legs syndrome with nocturnal myoclonus   . UARS (upper airway resistance syndrome) 06/28/2014    Past Surgical History  Procedure Laterality Date  . Hernia repair    . Back surgery  2003    L4-5  . Transurethral resection of prostate  03/24/2012    Procedure: TRANSURETHRAL RESECTION OF THE PROSTATE (TURP);  Surgeon: Fredricka Bonine, MD;  [facility];  Service: Urology;  Laterality: N/A;  with gyrus ,Greenlight PVP laser of Prostate  . Transurethral prostatectomy with gyrus instruments  04/28/2012    Procedure: TRANSURETHRAL PROSTATECTOMY WITH GYRUS INSTRUMENTS;  Surgeon: Fredricka Bonine, MD;  [facility];  Service: Urology;  Laterality: N/A;  . Cataract extraction      There were no vitals filed for this visit.       TODAY'S TREATMENT TherEx - Bridge on Heels 15x B FOB (65cm) Bridge combo 12x Seated Fitter Leg Press 1 Black + 1 Blue 20x each  Neuro - 9" Stool  diagonal  toe-tapping standing on Foam Pad Foam Beam side-stepping 2 laps Foam Beam FW tandem gait 6 laps (SBA, difficulty especially if attempts to slow down) Tandem gait on floor 30' Ball (55cm) on Wall narrow DL Squat 10x Ball (55cm) on Wall lunge 8x each TRX SL Squat with intermittent contra toe-touch assist 8x each Quick walking in halls with small ball toss/catch - able to maintain straight course and good speed but states felt wobbly Quick walking with EC - some decrease in speed and mild deviation to L but still quite good Lateral lunges over 6" foam rolls, 10x each, 2 LOB requiring MinA to prevent fall Foam Beam lateral lunges 8x each, several LOB but able to self-correct           PT Short Term Goals - 10/05/15 0850    PT SHORT TERM GOAL #1   Title pt independent with initial HEP by 10/04/15   Status On-going           PT Long Term Goals - 10/05/15 0850    PT LONG TERM GOAL #1   Title pt independent with advanced HEP as necessary by 11/01/15   Status On-going   PT LONG TERM GOAL #2   Title B  LE MMT 4+/5 or better by 11/01/15   Status On-going   PT LONG TERM GOAL #3   Title pt scores 50/56 or better on [place] by 11/01/15   Status On-going   PT LONG TERM GOAL #4   Title pt able to return to participation with [facility] by 11/01/15   Status On-going               Plan - 10/10/15 0843    Clinical Impression Statement Performed very well overall with chief LOB noted with lateral lunge type of activities.  Gait is straight but he states he still feels "wobbly" with this.  We will continue to focus on challenging activities with goal of getting him back to [employer] participation.   PT Next Visit Plan balance training, gait training, edurance and strengthening to tolerance, HEP progressions as able   Consulted and Agree with Plan of Care Patient      Patient will benefit from skilled therapeutic intervention in order to improve the following  deficits and impairments:  Postural dysfunction, Difficulty walking, Abnormal gait, Decreased balance, Decreased strength  Visit Diagnosis: Other abnormalities of gait and mobility     Problem List Patient Active Problem List   Diagnosis Date Noted  . UARS (upper airway resistance syndrome) 06/28/2014  . Restless leg syndrome, familial, uncontrolled 08/20/2013  . Restless legs syndrome with nocturnal myoclonus   . Abnormality of gait 02/09/2013  . Restless legs syndrome (RLS) 02/09/2013  . Acute kidney injury ([place]) 03/27/2012    [name] PT, OCS 10/10/2015, 8:46 AM  [facility] [address]  [place], [place], [address] Phone: [phone]   Fax:  [phone]  Name: Brandon Mason MRN: [id number] Date of Birth: 10-21-1925

## 2015-10-12 ENCOUNTER — Ambulatory Visit: Payer: Medicare Other | Admitting: Physical Therapy

## 2015-10-12 DIAGNOSIS — Z7409 Other reduced mobility: Secondary | ICD-10-CM | POA: Diagnosis not present

## 2015-10-12 DIAGNOSIS — R2689 Other abnormalities of gait and mobility: Secondary | ICD-10-CM

## 2015-10-12 DIAGNOSIS — M6289 Other specified disorders of muscle: Secondary | ICD-10-CM | POA: Diagnosis not present

## 2015-10-12 DIAGNOSIS — R269 Unspecified abnormalities of gait and mobility: Secondary | ICD-10-CM | POA: Diagnosis not present

## 2015-10-12 NOTE — Therapy (Signed)
[facility] [address]  [place], [place], [address] Phone: [phone]   Fax:  [phone]  Physical Therapy Treatment  Patient Details  Name: Brandon Mason MRN: [id number] Date of Birth: 1925/10/11 Referring Provider: Alonza Bogus, DO  Encounter Date: 10/12/2015      PT End of Session - 10/12/15 0803    Visit Number 6   Number of Visits 12   Date for PT Re-Evaluation 11/01/15   PT Start Time 0802   PT Stop Time 0841   PT Time Calculation (min) 39 min      Past Medical History  Diagnosis Date  . Hypertension   . Shortness of breath     with exertion   . Arthritis   . Prostate disorder   . Cardiomegaly   . Chronic kidney disease     BPH  . Neuromuscular disorder (HCC)     restless leg syndrome   . Hypoxia     pulmonary  . Nocturia   . Hypersomnia   . Chronic leg pain   . RLS (restless legs syndrome)     x 40 years  . Abnormality of gait 02/09/2013  . Restless legs syndrome (RLS) 02/09/2013  . Restless legs syndrome with nocturnal myoclonus   . UARS (upper airway resistance syndrome) 06/28/2014    Past Surgical History  Procedure Laterality Date  . Hernia repair    . Back surgery  2003    L4-5  . Transurethral resection of prostate  03/24/2012    Procedure: TRANSURETHRAL RESECTION OF THE PROSTATE (TURP);  Surgeon: Fredricka Bonine, MD;  [facility];  Service: Urology;  Laterality: N/A;  with gyrus ,Greenlight PVP laser of Prostate  . Transurethral prostatectomy with gyrus instruments  04/28/2012    Procedure: TRANSURETHRAL PROSTATECTOMY WITH GYRUS INSTRUMENTS;  Surgeon: Fredricka Bonine, MD;  [facility];  Service: Urology;  Laterality: N/A;  . Cataract extraction      There were no vitals filed for this visit.      Subjective Assessment - 10/12/15 0804    Subjective no complaints, states left hearing aides at home so greater difficulty hearing today.   Currently in  Pain? No/denies           TODAY'S TREATMENT TherEx - B FOB (55cm) Bridge 12x without UE A Standing Hip ABD Red Tb at ankles and light UE assist on back of chair 15x each Standing Hip Flexion Red Tb at ankles and light UE assist on back of chair 15x each Standing B Heel Raise 20x no HHA Lateral Lunges with Red TB at ankles 15x each  Neuro - Foam Beam FW tandem gait 8 laps CGA Foam Beam FW/BW tandem gait 3 laps CGA Foam Beam lateral lunges 8x each, all with SBA and no LOB Lateral lunges over 6" foam rolls, 8x each, SBA no LOB Foam  Disc SLS with Single Pole A and CGA 2x15" each (able to perform with R LE, several LOB requiring contra LE assist with L LE) 9" Stool diagonal toe-tapping standing on Foam Pad 12x each 9" stool ALT side step-ups 5x 2 LOB to L requiring Min A TRX SL Squat with intermittent contra toe-touch assist 8x each                        PT Education - 10/12/15 0844    Education provided Yes   Education Details HEP progression   Person(s)  Educated Patient   Methods Explanation;Demonstration;Handout   Comprehension Verbalized understanding;Returned demonstration          PT Short Term Goals - 10/12/15 0845    PT SHORT TERM GOAL #1   Title pt independent with initial HEP by 10/04/15   Status Achieved           PT Long Term Goals - 10/12/15 0845    PT LONG TERM GOAL #1   Title pt independent with advanced HEP as necessary by 11/01/15   Status On-going   PT LONG TERM GOAL #2   Title B LE MMT 4+/5 or better by 11/01/15   Status On-going   PT LONG TERM GOAL #3   Title pt scores 50/56 or better on [place] by 11/01/15   Status On-going   PT LONG TERM GOAL #4   Title pt able to return to participation with [facility] by 11/01/15   Status On-going               Plan - 10/12/15 0842    Clinical Impression Statement another good performance - greatest difficulty with tandem gait on foam beam.  Performed much better with  lateral lunges on foam beam.  Updated HEP.   PT Next Visit Plan balance training, gait training, edurance and strengthening to tolerance   Consulted and Agree with Plan of Care Patient      Patient will benefit from skilled therapeutic intervention in order to improve the following deficits and impairments:  Postural dysfunction, Difficulty walking, Abnormal gait, Decreased balance, Decreased strength  Visit Diagnosis: Other abnormalities of gait and mobility     Problem List Patient Active Problem List   Diagnosis Date Noted  . UARS (upper airway resistance syndrome) 06/28/2014  . Restless leg syndrome, familial, uncontrolled 08/20/2013  . Restless legs syndrome with nocturnal myoclonus   . Abnormality of gait 02/09/2013  . Restless legs syndrome (RLS) 02/09/2013  . Acute kidney injury ([place]) 03/27/2012    Ailed Defibaugh PT, OCS 10/12/2015, 8:45 AM  [facility] [address]  [place], [place], [address] Phone: [phone]   Fax:  [phone]  Name: Brandon Mason MRN: [id number] Date of Birth: [birth year]

## 2015-10-16 ENCOUNTER — Ambulatory Visit: Payer: Medicare Other

## 2015-10-16 DIAGNOSIS — Z7409 Other reduced mobility: Secondary | ICD-10-CM | POA: Diagnosis not present

## 2015-10-16 DIAGNOSIS — R2689 Other abnormalities of gait and mobility: Secondary | ICD-10-CM

## 2015-10-16 DIAGNOSIS — M6289 Other specified disorders of muscle: Secondary | ICD-10-CM | POA: Diagnosis not present

## 2015-10-16 DIAGNOSIS — R269 Unspecified abnormalities of gait and mobility: Secondary | ICD-10-CM | POA: Diagnosis not present

## 2015-10-16 NOTE — Therapy (Signed)
[facility] [address]  [place], [place], [address] Phone: [phone]   Fax:  [phone]  Physical Therapy Treatment  Patient Details  Name: Brandon Mason MRN: [id number] Date of Birth: August 21, 1925 Referring Provider: Alonza Bogus, DO  Encounter Date: 10/16/2015      PT End of Session - 10/16/15 0803    Visit Number 7   Number of Visits 12   Date for PT Re-Evaluation 11/01/15   PT Start Time 0800   PT Stop Time 0845   PT Time Calculation (min) 45 min   Activity Tolerance Patient tolerated treatment well   Behavior During Therapy [facility] for tasks assessed/performed      Past Medical History  Diagnosis Date  . Hypertension   . Shortness of breath     with exertion   . Arthritis   . Prostate disorder   . Cardiomegaly   . Chronic kidney disease     BPH  . Neuromuscular disorder (HCC)     restless leg syndrome   . Hypoxia     pulmonary  . Nocturia   . Hypersomnia   . Chronic leg pain   . RLS (restless legs syndrome)     x 40 years  . Abnormality of gait 02/09/2013  . Restless legs syndrome (RLS) 02/09/2013  . Restless legs syndrome with nocturnal myoclonus   . UARS (upper airway resistance syndrome) 06/28/2014    Past Surgical History  Procedure Laterality Date  . Hernia repair    . Back surgery  2003    L4-5  . Transurethral resection of prostate  03/24/2012    Procedure: TRANSURETHRAL RESECTION OF THE PROSTATE (TURP);  Surgeon: Fredricka Bonine, MD;  [facility];  Service: Urology;  Laterality: N/A;  with gyrus ,Greenlight PVP laser of Prostate  . Transurethral prostatectomy with gyrus instruments  04/28/2012    Procedure: TRANSURETHRAL PROSTATECTOMY WITH GYRUS INSTRUMENTS;  Surgeon: Fredricka Bonine, MD;  [facility]: [facility];  Service: Urology;  Laterality: N/A;  . Cataract extraction      There were no vitals filed for this visit.      Subjective Assessment - 10/16/15 0803     Subjective no complaints.  Pt. states he tried to swim at the [facility] this morning and was dissapointed with his endurance level.     Currently in Pain? No/denies   Multiple Pain Sites No      TODAY'S TREATMENT TherEx: Hooklying bridges x 15 reps  Standing B Hip ABD, adduction, flexion, ext., with red TB at ankles and light UE assist on back of chair 15x each side  Side stepping 2 x 20 ft with green TB around knees Standing B Heel Raise 20x 2 UE support on UBE   Neuro: Foam Beam FW tandem gait 4 laps CGA Foam Beam FW/BW tandem gait 2 laps CGA Foam Beam side stepping x 4 laps CGA  Tandem forward walk on tape on carpet 6 x 10 ft; supervision from therapist Lateral lunges over 2" half foam rolls,x 10 each side; SBA no LOB       PT Short Term Goals - 10/12/15 0845    PT SHORT TERM GOAL #1   Title pt independent with initial HEP by 10/04/15   Status Achieved           PT Long Term Goals - 10/12/15 0845    PT LONG TERM GOAL #1   Title pt independent with advanced HEP as necessary  by 11/01/15   Status On-going   PT LONG TERM GOAL #2   Title B LE MMT 4+/5 or better by 11/01/15   Status On-going   PT LONG TERM GOAL #3   Title pt scores 50/56 or better on [place] by 11/01/15   Status On-going   PT LONG TERM GOAL #4   Title pt able to return to participation with [facility] by 11/01/15   Status On-going               Plan - 10/16/15 0804    Clinical Impression Statement Pt. performed well with hip / knee strenthening activity, however expressed frustration with his balance today; pt. continues to be challanged with tandem stance on foam balance beam however able to self-correct with occasional LOB.  Pt. reports swimming at the [facility] earlier this morning; and expressed interest in additional activities to build activity tolerance / decrease shortness of breath; pt. adviced to join pool activity classes.     PT Treatment/Interventions Therapeutic exercise;Gait  training;Balance training;Therapeutic activities;Functional mobility training;Stair training   PT Next Visit Plan balance training, gait training, edurance and strengthening to tolerance      Patient will benefit from skilled therapeutic intervention in order to improve the following deficits and impairments:  Postural dysfunction, Difficulty walking, Abnormal gait, Decreased balance, Decreased strength  Visit Diagnosis: Other abnormalities of gait and mobility     Problem List Patient Active Problem List   Diagnosis Date Noted  . UARS (upper airway resistance syndrome) 06/28/2014  . Restless leg syndrome, familial, uncontrolled 08/20/2013  . Restless legs syndrome with nocturnal myoclonus   . Abnormality of gait 02/09/2013  . Restless legs syndrome (RLS) 02/09/2013  . Acute kidney injury [facility]) 03/27/2012    Bess Harvest, PTA 10/16/2015, 4:56 PM  [facility] [address]  [place], [place], [address] Phone: [phone]   Fax:  [phone]  Name: Brandon Mason MRN: [id number] Date of Birth: 10-21-1925

## 2015-10-19 ENCOUNTER — Ambulatory Visit: Payer: Medicare Other

## 2015-10-19 DIAGNOSIS — Z7409 Other reduced mobility: Secondary | ICD-10-CM | POA: Diagnosis not present

## 2015-10-19 DIAGNOSIS — R2689 Other abnormalities of gait and mobility: Secondary | ICD-10-CM | POA: Diagnosis not present

## 2015-10-19 DIAGNOSIS — R269 Unspecified abnormalities of gait and mobility: Secondary | ICD-10-CM | POA: Diagnosis not present

## 2015-10-19 DIAGNOSIS — M6289 Other specified disorders of muscle: Secondary | ICD-10-CM | POA: Diagnosis not present

## 2015-10-19 NOTE — Therapy (Signed)
[facility] [address]  [place], [place], [address] Phone: [phone]   Fax:  [phone]  Physical Therapy Treatment  Patient Details  Name: Brandon Mason MRN: [id number] Date of Birth: 1925/08/02 Referring Provider: Alonza Bogus, DO  Encounter Date: 10/19/2015      PT End of Session - 10/19/15 0810    Visit Number 8   Number of Visits 12   Date for PT Re-Evaluation 11/01/15   PT Start Time 0804   PT Stop Time 0844   PT Time Calculation (min) 40 min   Activity Tolerance Patient tolerated treatment well   Behavior During Therapy [facility] for tasks assessed/performed      Past Medical History  Diagnosis Date  . Hypertension   . Shortness of breath     with exertion   . Arthritis   . Prostate disorder   . Cardiomegaly   . Chronic kidney disease     BPH  . Neuromuscular disorder (HCC)     restless leg syndrome   . Hypoxia     pulmonary  . Nocturia   . Hypersomnia   . Chronic leg pain   . RLS (restless legs syndrome)     x 40 years  . Abnormality of gait 02/09/2013  . Restless legs syndrome (RLS) 02/09/2013  . Restless legs syndrome with nocturnal myoclonus   . UARS (upper airway resistance syndrome) 06/28/2014    Past Surgical History  Procedure Laterality Date  . Hernia repair    . Back surgery  2003    L4-5  . Transurethral resection of prostate  03/24/2012    Procedure: TRANSURETHRAL RESECTION OF THE PROSTATE (TURP);  Surgeon: Fredricka Bonine, MD;  Location: [facility];  Service: Urology;  Laterality: N/A;  with gyrus ,Greenlight PVP laser of Prostate  . Transurethral prostatectomy with gyrus instruments  04/28/2012    Procedure: TRANSURETHRAL PROSTATECTOMY WITH GYRUS INSTRUMENTS;  Surgeon: Fredricka Bonine, MD;  Location: [facility];  Service: Urology;  Laterality: N/A;  . Cataract extraction      There were no vitals filed for this visit.      Subjective Assessment - 10/19/15 0806     Subjective No complaints.  Pt. states he tried to swam at the [facility] this morning however has to swim laps on his back because its easier for him.      Patient Stated Goals wants to get back to participating in [facility]   Currently in Pain? No/denies   Multiple Pain Sites No      TODAY'S TREATMENT: TherEx: NuStep: level 5, 4 min (level 10 due to pt. request for 2 min) Hooklying bridges x 10 reps   Hooklying bridge with B hip abd/ER with blue TB x 10 reps  B sidelying clam shell with blue TB x 10 reps Standing B Hip ABD, adduction, flexion, ext., with red TB at ankles on blue foam pad with toe touch on 8" step x 15 reps each way; close CGA form therapist Side stepping 2 x 20 ft with green TB around knees Standing B Heel Raise 20x 2 UE support on UBE   Neuro re-ed (Seated rest breaks between each activity): Alternating toe-touch on 8" step standing on blue foam pad x 10 each side  Alternating toe- touch to cups on 8" step while standing on blue foam pad x 10 reps each side Foam Beam FW tandem gait 4 laps CGA Foam Beam FW/BW tandem gait  2 laps CGA Foam Beam side stepping x 4 laps CGA   Alternating forward step over  2" half foam rolls,x 10 each side; SBA no LOB       PT Short Term Goals - 10/12/15 0845    PT SHORT TERM GOAL #1   Title pt independent with initial HEP by 10/04/15   Status Achieved           PT Long Term Goals - 10/12/15 0845    PT LONG TERM GOAL #1   Title pt independent with advanced HEP as necessary by 11/01/15   Status On-going   PT LONG TERM GOAL #2   Title B LE MMT 4+/5 or better by 11/01/15   Status On-going   PT LONG TERM GOAL #3   Title pt scores 50/56 or better on Berg by 11/01/15   Status On-going   PT LONG TERM GOAL #4   Title pt able to return to participation with [facility] by 11/01/15   Status On-going               Plan - 10/19/15 0810    Clinical Impression Statement Pt. performed well with TB  resisted standing hip strengthening activity today standing on airex pad; cup target used with pt. able to toe-touch well.  Balance activity on foam beam continued today with pt. able to side step without UE support and only supervision from therapist.  Pt. reports swimming at [facility] every day this week.     Clinical Impairments Affecting Rehab Potential age, peripheral neuropathy, rapid fatigue/SOB   PT Treatment/Interventions Therapeutic exercise;Gait training;Balance training;Therapeutic activities;Functional mobility training;Stair training   PT Next Visit Plan Balance training, gait training, edurance and strengthening to tolerance      Patient will benefit from skilled therapeutic intervention in order to improve the following deficits and impairments:  Postural dysfunction, Difficulty walking, Abnormal gait, Decreased balance, Decreased strength  Visit Diagnosis: Other abnormalities of gait and mobility     Problem List Patient Active Problem List   Diagnosis Date Noted  . UARS (upper airway resistance syndrome) 06/28/2014  . Restless leg syndrome, familial, uncontrolled 08/20/2013  . Restless legs syndrome with nocturnal myoclonus   . Abnormality of gait 02/09/2013  . Restless legs syndrome (RLS) 02/09/2013  . Acute kidney injury [facility]) 03/27/2012    Bess Harvest, PTA 10/19/2015, 9:08 AM  [facility] [address]  [place], [place], [address] Phone: [phone]   Fax:  [phone]  Name: Fountain Liceaga MRN: [id number] Date of Birth: 1926-02-12

## 2015-10-23 ENCOUNTER — Ambulatory Visit: Payer: Medicare Other

## 2015-10-23 DIAGNOSIS — R2689 Other abnormalities of gait and mobility: Secondary | ICD-10-CM | POA: Diagnosis not present

## 2015-10-23 DIAGNOSIS — M6289 Other specified disorders of muscle: Secondary | ICD-10-CM | POA: Diagnosis not present

## 2015-10-23 DIAGNOSIS — Z7409 Other reduced mobility: Secondary | ICD-10-CM | POA: Diagnosis not present

## 2015-10-23 DIAGNOSIS — R269 Unspecified abnormalities of gait and mobility: Secondary | ICD-10-CM | POA: Diagnosis not present

## 2015-10-23 NOTE — Therapy (Signed)
[facility] [address]  [place], [place], [address] Phone: [phone]   Fax:  [phone]  Physical Therapy Treatment  Patient Details  Name: Lelend Aguino MRN: [id number] Date of Birth: 06-12-1926 Referring Provider: Alonza Bogus, DO  Encounter Date: 10/23/2015      PT End of Session - 10/23/15 0827    Visit Number 9   Number of Visits 12   Date for PT Re-Evaluation 11/01/15   PT Start Time 0820  PT started late due to pt. arriving late to therapy.     PT Stop Time 0844   PT Time Calculation (min) 24 min   Activity Tolerance Patient tolerated treatment well   Behavior During Therapy [facility] for tasks assessed/performed      Past Medical History  Diagnosis Date  . Hypertension   . Shortness of breath     with exertion   . Arthritis   . Prostate disorder   . Cardiomegaly   . Chronic kidney disease     BPH  . Neuromuscular disorder (HCC)     restless leg syndrome   . Hypoxia     pulmonary  . Nocturia   . Hypersomnia   . Chronic leg pain   . RLS (restless legs syndrome)     x 40 years  . Abnormality of gait 02/09/2013  . Restless legs syndrome (RLS) 02/09/2013  . Restless legs syndrome with nocturnal myoclonus   . UARS (upper airway resistance syndrome) 06/28/2014    Past Surgical History  Procedure Laterality Date  . Hernia repair    . Back surgery  2003    L4-5  . Transurethral resection of prostate  03/24/2012    Procedure: TRANSURETHRAL RESECTION OF THE PROSTATE (TURP);  Surgeon: Fredricka Bonine, MD;  [facility];  Service: Urology;  Laterality: N/A;  with gyrus ,Greenlight PVP laser of Prostate  . Transurethral prostatectomy with gyrus instruments  04/28/2012    Procedure: TRANSURETHRAL PROSTATECTOMY WITH GYRUS INSTRUMENTS;  Surgeon: Fredricka Bonine, MD;  [facility];  Service: Urology;  Laterality: N/A;  . Cataract extraction      There were no vitals filed for this  visit.      Subjective Assessment - 10/23/15 0824    Subjective Pt. reports he has continued to swim at the [facility].  No pain or other complaints reported.     Patient Stated Goals wants to get back to participating in [facility]   Currently in Pain? No/denies   Multiple Pain Sites No      TODAY'S TREATMENT: TherEx: NuStep: level 4, 4 min  Hooklying bridges x 10 reps  Hooklying bridge with B hip abd/ER with black TB x 10 reps  B sidelying clam shell with black TB x 10 reps  Neuro re-ed (Seated rest breaks between each activity): Alternating toe-touch on 8" step standing on blue foam pad x 10 each side  Alternating toe- touch to cups on 8" step while standing on blue foam pad x 10 reps each side Alternating lunge on BOSU (up) with opposite site UE rotation touch to cone on white foam pad 2 x 10 reps        PT Short Term Goals - 10/12/15 0845    PT SHORT TERM GOAL #1   Title pt independent with initial HEP by 10/04/15   Status Achieved           PT Long Term Goals - 10/12/15  0845    PT LONG TERM GOAL #1   Title pt independent with advanced HEP as necessary by 11/01/15   Status On-going   PT LONG TERM GOAL #2   Title B LE MMT 4+/5 or better by 11/01/15   Status On-going   PT LONG TERM GOAL #3   Title pt scores 50/56 or better on Berg by 11/01/15   Status On-going   PT LONG TERM GOAL #4   Title pt able to return to participation with [facility] by 11/01/15   Status On-going               Plan - 10/23/15 [id number]    Clinical Impression Statement Treatment shortened today due to pt. arriving late to therapy.  Pt. performed well with all stepping and balance activity today with pain or complaints.  Alternating lunges on BOSU (up) ball added today with cross-over cone touch; pt. challanged with this activity however demo'd good wt. shift and willing to operated outside of comfort zone.    Clinical Impairments Affecting Rehab Potential age,  peripheral neuropathy, rapid fatigue/SOB   PT Treatment/Interventions Therapeutic exercise;Gait training;Balance training;Therapeutic activities;Functional mobility training;Stair training   PT Next Visit Plan Administer FOTO; Balance training, gait training, edurance and strengthening to tolerance      Patient will benefit from skilled therapeutic intervention in order to improve the following deficits and impairments:  Postural dysfunction, Difficulty walking, Abnormal gait, Decreased balance, Decreased strength  Visit Diagnosis: Other abnormalities of gait and mobility     Problem List Patient Active Problem List   Diagnosis Date Noted  . UARS (upper airway resistance syndrome) 06/28/2014  . Restless leg syndrome, familial, uncontrolled 08/20/2013  . Restless legs syndrome with nocturnal myoclonus   . Abnormality of gait 02/09/2013  . Restless legs syndrome (RLS) 02/09/2013  . Acute kidney injury [facility]) 03/27/2012    Bess Harvest, PTA 10/23/2015, 1:00 PM  [facility] [address]  [place], [place], [address] Phone: [phone]   Fax:  [phone]  Name: Rondo Koel MRN: [id number] Date of Birth: Jan 04, 1926

## 2015-10-26 ENCOUNTER — Ambulatory Visit: Payer: Medicare Other | Admitting: Physical Therapy

## 2015-10-26 DIAGNOSIS — R2689 Other abnormalities of gait and mobility: Secondary | ICD-10-CM

## 2015-10-26 DIAGNOSIS — R269 Unspecified abnormalities of gait and mobility: Secondary | ICD-10-CM | POA: Diagnosis not present

## 2015-10-26 DIAGNOSIS — Z7409 Other reduced mobility: Secondary | ICD-10-CM | POA: Diagnosis not present

## 2015-10-26 DIAGNOSIS — M6289 Other specified disorders of muscle: Secondary | ICD-10-CM | POA: Diagnosis not present

## 2015-10-26 NOTE — Therapy (Addendum)
[facility] [address]  [place], [place], [address] Phone: [phone]   Fax:  [phone]  Physical Therapy Treatment  Patient Details  Name: Brandon Mason MRN: [id number] Date of Birth: 1926/01/29 Referring Provider: Alonza Bogus, DO  Encounter Date: 10/26/2015      PT End of Session - 10/26/15 0802    Visit Number 10   Number of Visits 12   Date for PT Re-Evaluation 11/01/15   PT Start Time 0800   PT Stop Time 0845   PT Time Calculation (min) 45 min           Subjective Assessment - 10/26/15 0801    Subjective no complaints, no pain   Currently in Pain? No/denies            [facility] PT Assessment - 10/26/15 0001    Berg Balance Test   Sit to Stand Able to stand without using hands and stabilize independently   Standing Unsupported Able to stand safely 2 minutes   Sitting with Back Unsupported but Feet Supported on Floor or Stool Able to sit safely and securely 2 minutes   Stand to Sit Sits safely with minimal use of hands   Transfers Able to transfer safely, minor use of hands   Standing Unsupported with Eyes Closed Able to stand 10 seconds safely   Standing Ubsupported with Feet Together Able to place feet together independently and stand 1 minute safely   From Standing, Reach Forward with Outstretched Arm Can reach confidently >25 cm (10")   From Standing Position, Pick up Object from Floor Able to pick up shoe safely and easily   From Standing Position, Turn to Look Behind Over each Shoulder Looks behind one side only/other side shows less weight shift   Turn 360 Degrees Able to turn 360 degrees safely one side only in 4 seconds or less   Standing Unsupported, Alternately Place Feet on Step/Stool Able to stand independently and safely and complete 8 steps in 20 seconds   Standing Unsupported, One Foot in Front Able to plae foot ahead of the other independently and hold 30 seconds   Standing on One  Leg Able to lift leg independently and hold equal to or more than 3 seconds   Total Score 51      TODAY'S TREATMENT Berg Assessment  TherEx - NuStep lvl 4, 4' 8"  step toe-tapping 10x each TRX lateral lunges 10x each 6" Foam Roll lateral lunge stepovers 8x each (nicked one FR on L while stepping over but no LOB) Fitter Lateral Lunges 2 Blue 12x each, 1 Blue + 1 Black 12x each with intermittent B pole A (more difficult pushing to L, no c/o pain, no LOB) Low Row with LE wt shifting, Blue TB 15x each   Neuro - 8" step toe-tapping while standing on Foam pad 10x each Foam Pad narrow standing 30" EO and 30" EC Tandem gait FW/BW 20' each Facing wall diagonal low reaching with lateral lunges 10x each Foam Beam FW/BW walking 4 laps each Foam Beam side-stepping 3 laps each FW, BW, and side-stepping with EC         PT Short Term Goals - 10/12/15 0845    PT SHORT TERM GOAL #1   Title pt independent with initial HEP by 10/04/15   Status Achieved           PT Long Term Goals - 10/26/15 0926    PT LONG TERM GOAL #1  Title pt independent with advanced HEP as necessary by 11/01/15  is performing HEP, exercising at [facility] (pool exercises)   Status Achieved   PT LONG TERM GOAL #2   Title B LE MMT 4+/5 or better by 11/01/15  not re-assessed today   Status On-going   PT LONG TERM GOAL #3   Title pt scores 50/56 or better on Berg by 11/01/15  51/56 today   Status Achieved   PT LONG TERM GOAL #4   Title pt able to return to participation with [facility] by 11/01/15  hasn't attempted, encouraged him to go ahead and try   Status On-going               Plan - Oct 31, 2015 0928    Clinical Impression Statement progressing very well, met Merrilee Jansky goal. Encouraged him to return to [employer] participation as this seems safe at this point.  Is performing HEP.  He has 2 visits remaining on POC but goals basically met (did not re-assess LE MMT however) and he states he's going to  work on [employer] and going to [employer] rather than attend PT for now. We will hold his chart for a few weeks and if he doesn't return we'll discharge.   PT Next Visit Plan assess MMT if he returns; if does not return, discharge in 2-4 wks.   Consulted and Agree with Plan of Care Patient      Patient will benefit from skilled therapeutic intervention in order to improve the following deficits and impairments:  Postural dysfunction, Difficulty walking, Abnormal gait, Decreased balance, Decreased strength  Visit Diagnosis: Other abnormalities of gait and mobility       G-Codes - 10/31/2015 0758    Functional Assessment Tool Used foto 31% limitation (down from 40% at initial eval)   Functional Limitation Mobility: Walking and moving around   Mobility: Walking and Moving Around Current and Discharge Status At least 20 percent but less than 40 percent impaired, limited or restricted   Mobility: Walking and Moving Around Goal Status At least 20 percent but less than 40 percent impaired, limited or restricted       [facility] PT, OCS 10-31-15, 9:32 AM  [facility] [address]  [place], [place], [address] Phone: [phone]   Fax:  [phone]  Name: Brandon Mason MRN: [id number] Date of Birth: [birth year]   PHYSICAL THERAPY DISCHARGE SUMMARY  Visits from Start of Care: 10  Current functional level related to goals / functional outcomes: [name] progressed very well with OPPT addressing impaired gait/balance.  At his last PT treatment on 31-Oct-2015 he scored 51/56 with Merrilee Jansky assessment and he stated at end that treatment that he felt as though was doing well and felt as though he could continue with exercising independently at [facility].  At his last PT treatment he hadn't yet returned to participation in [facility] duties but I encouraged him to try.   Remaining deficits: Slight balance impairment   Education / Equipment: HEP along  with gait/balance training Plan: Patient agrees to discharge.  Patient goals were partially met. Patient is being discharged due to being pleased with the current functional level.  ?????        Leonette Most PT, OCS 11/22/2015 9:15 AM

## 2015-11-16 ENCOUNTER — Ambulatory Visit (HOSPITAL_BASED_OUTPATIENT_CLINIC_OR_DEPARTMENT_OTHER)
Admission: RE | Admit: 2015-11-16 | Discharge: 2015-11-16 | Disposition: A | Discharge status: Home / Self Care | Payer: Medicare Other | Source: Ambulatory Visit | Attending: Medical | Admitting: Medical

## 2015-11-16 ENCOUNTER — Encounter: Payer: Self-pay | Admitting: Medical

## 2015-11-16 ENCOUNTER — Ambulatory Visit (INDEPENDENT_AMBULATORY_CARE_PROVIDER_SITE_OTHER): Payer: Medicare Other | Admitting: Medical

## 2015-11-16 VITALS — BP 124/84 | HR 78 | Temp 98.1°F | Ht 68.0 in | Wt 180.0 lb

## 2015-11-16 DIAGNOSIS — R635 Abnormal weight gain: Secondary | ICD-10-CM | POA: Diagnosis not present

## 2015-11-16 DIAGNOSIS — R600 Localized edema: Secondary | ICD-10-CM | POA: Insufficient documentation

## 2015-11-16 DIAGNOSIS — M25561 Pain in right knee: Secondary | ICD-10-CM

## 2015-11-16 DIAGNOSIS — R062 Wheezing: Secondary | ICD-10-CM | POA: Diagnosis not present

## 2015-11-16 DIAGNOSIS — R0600 Dyspnea, unspecified: Secondary | ICD-10-CM | POA: Diagnosis not present

## 2015-11-16 DIAGNOSIS — R0602 Shortness of breath: Secondary | ICD-10-CM | POA: Diagnosis not present

## 2015-11-16 MED ORDER — ALBUTEROL SULFATE HFA 108 (90 BASE) MCG/ACT IN AERS: 2.0000 | INHALATION_SPRAY | Freq: Four times a day (QID) | RESPIRATORY_TRACT | Status: DC | PRN

## 2015-11-16 NOTE — Patient Instructions (Addendum)
For your swelling of your legs and  weight gain will get cxr, cmp and  bnp. We may need to adjust you lasix if fluid found in lungs.  For your rt leg pqin will get lower ext [place]. Please get that done today. Will be done stat.  For any occasional  wheezing. Will rx albuterol to have on hand if you need for wheezing.  Follow up date to be determined after lab review(possible 10 days approximate) or as needed

## 2015-11-16 NOTE — Progress Notes (Signed)
Pre visit review using our clinic review tool, if applicable. No additional management support is needed unless otherwise documented below in the visit note. 

## 2015-11-16 NOTE — Progress Notes (Signed)
Subjective:    Patient ID: Brandon Mason, male    DOB: [birth year], 82 y.o.   MRN: [id number]  HPI  Pt in for first time. Pt state his MD at [facility] is on medical leave.  So patient in to possibly find new provider.  Former Therapist, occupational in [place], pt works out at [employer] 10-15 minutes, no caffeine beverages, married 47 years.   Pt states recently his ankles and feet have been swelling over past few months. Pt thinks some weight recently. He estimates 10 lbs over 6 months.   Pt notes some shortness of breath that started one year ago. Pt notices get sob easily. Minimal activity. Occasionally may wheeze. Pt states he tried inhaler couple of months ago. Written by [facility] at that time.   Pt thinks last cxr he got was about 4 months ago.  Pt does have history of cardiomegaly.   Hx of bph- in 2013 had surgery for that.  RLS- pt uses requip and states does help most of time. He states gabapentin has with some nerve pain associated as well.   Htn- well controlled today.   Rt lower calf pain and lump just found today upper medial calf.    Review of Systems  Constitutional: Positive for unexpected weight change. Negative for fever, chills and fatigue.  Respiratory: Positive for shortness of breath and wheezing. Negative for cough and chest tightness.        Occasional  Gastrointestinal: Negative for abdominal pain.  Musculoskeletal: Positive for neck pain. Negative for back pain.       Rt leg pain. See hpi and physical.  Skin: Negative for rash.  Neurological: Negative for dizziness, seizures, weakness and headaches.  Hematological: Negative for adenopathy. Does not bruise/bleed easily.  Psychiatric/Behavioral: Negative for behavioral problems, confusion and dysphoric mood.   Past Medical History  Diagnosis Date  . Hypertension   . Shortness of breath     with exertion   . Arthritis   . Prostate disorder   . Cardiomegaly   . Chronic kidney disease     BPH    . Neuromuscular disorder (HCC)     restless leg syndrome   . Hypoxia     pulmonary  . Nocturia   . Hypersomnia   . Chronic leg pain   . RLS (restless legs syndrome)     x 40 years  . Abnormality of gait 02/09/2013  . Restless legs syndrome (RLS) 02/09/2013  . Restless legs syndrome with nocturnal myoclonus   . UARS (upper airway resistance syndrome) 06/28/2014     Social History   Social History  . Marital Status: Married    Spouse Name: [name]  . Number of Children: 5  . Years of Education: 12   Occupational History  . retired      [name] for a [employer]  . Smoking status: Never Smoker   . Smokeless tobacco: Never Used  . Alcohol Use: 1.2 oz/week    2 Cans of beer per week     Comment: 2 cans of beer per week   . Drug Use: No  . Sexual Activity: Not on file   Other Topics Concern  . Not on file   Social History Narrative   Patient lives at home with his wife [name].   Patient is retired.    Patient has 5 children.   Patient has a high school education.   Patient is right-handed.  Patient drinks very little caffeine.    Past Surgical History  Procedure Laterality Date  . Hernia repair    . Back surgery  2003    L4-5  . Transurethral resection of prostate  03/24/2012    Procedure: TRANSURETHRAL RESECTION OF THE PROSTATE (TURP);  Surgeon: Fredricka Bonine, MD;  [facility]: [facility];  Service: Urology;  Laterality: N/A;  with gyrus ,Greenlight PVP laser of Prostate  . Transurethral prostatectomy with gyrus instruments  04/28/2012    Procedure: TRANSURETHRAL PROSTATECTOMY WITH GYRUS INSTRUMENTS;  Surgeon: Fredricka Bonine, MD;  Location: [facility];  Service: Urology;  Laterality: N/A;  . Cataract extraction      Family History  Problem Relation Age of Onset  . Restless legs syndrome Mother   . Restless legs syndrome Daughter     No Known Allergies  Current Outpatient Prescriptions on File Prior to Visit   Medication Sig Dispense Refill  . aspirin 325 MG tablet Take 325 mg by mouth daily.    . furosemide (LASIX) 40 MG tablet Take 40 mg by mouth.    . gabapentin (NEURONTIN) 600 MG tablet Take 1 tablet (600 mg total) by mouth 3 (three) times daily. 270 tablet 0  . lisinopril (PRINIVIL,ZESTRIL) 20 MG tablet Take 20 mg by mouth.    Marland Kitchen rOPINIRole (REQUIP) 2 MG tablet Take 1 tablet (2 mg total) by mouth 3 (three) times daily. 270 tablet 1   No current facility-administered medications on file prior to visit.    BP 124/84 mmHg  Pulse 78  Temp(Src) 98.1 F (36.7 C) (Oral)  Ht 5\' 8"  (1.727 m)  Wt 180 lb (81.647 kg)  BMI 27.38 kg/m2  SpO2 93%       Objective:   Physical Exam  General Mental Status- Alert. General Appearance- Not in acute distress.   Skin General: Color- Normal Color. Moisture- Normal Moisture.  Neck Carotid Arteries- Normal color. Moisture- Normal Moisture. No carotid bruits. No JVD.  Chest and Lung Exam Auscultation: Breath Sounds:-Normal.  Cardiovascular Auscultation:Rythm- Regular. Murmurs & Other Heart Sounds:Auscultation of the heart reveals- No Murmurs.  Abdomen Inspection:-Inspeection Normal. Palpation/Percussion:Note:No mass. Palpation and Percussion of the abdomen reveal- Non Tender, Non Distended + BS, no rebound or guarding.  Neurologic Cranial Nerve exam:- CN III-XII intact(No nystagmus), symmetric smile. Strength:- 5/5 equal and symmetric strength both upper and lower extremities.  Rt side calf- medial calf tender area upper aspect. Knot or lump. 1+ pedal edema  Lt lower ext- 1+ pedal edema. Both lower ext negative homans sign.        Assessment & Plan:  For your swelling of your legs and weight gain will get cxr, cmp and bnp. We may need to adjust you lasix if fluid found in lungs.  For your rt leg pain will get lower ext [place]. Please get that done today. Will be done stat.  For any occasional  wheezing. Will rx albuterol to have on  hand if you need for wheezing.  Follow up date to be determined after lab review(possible 10 days approximate) or as needed  Kaytee Taliercio, Percell Miller, [employer]

## 2015-11-17 LAB — COMPREHENSIVE METABOLIC PANEL
ALK PHOS: 54 U/L (ref 39–117)
ALT: 14 U/L (ref 0–53)
AST: 17 U/L (ref 0–37)
Albumin: 4.1 g/dL (ref 3.5–5.2)
BILIRUBIN TOTAL: 0.5 mg/dL (ref 0.2–1.2)
BUN: 28 mg/dL — ABNORMAL HIGH (ref 6–23)
CALCIUM: 9.3 mg/dL (ref 8.4–10.5)
CO2: 30 mEq/L (ref 19–32)
CREATININE: 1.08 mg/dL (ref 0.40–1.50)
Chloride: 106 mEq/L (ref 96–112)
GFR: 68.67 mL/min (ref >60.00)
GLUCOSE: 91 mg/dL (ref 70–99)
Potassium: 3.7 mEq/L (ref 3.5–5.1)
Sodium: 141 mEq/L (ref 135–145)
TOTAL PROTEIN: 6.7 g/dL (ref 6.0–8.3)

## 2015-11-17 LAB — BRAIN NATRIURETIC PEPTIDE: Pro B Natriuretic peptide (BNP): 49 pg/mL (ref 0.0–100.0)

## 2015-11-20 ENCOUNTER — Encounter: Payer: Medicare Other | Admitting: Medical

## 2015-11-20 ENCOUNTER — Telehealth: Payer: Self-pay | Admitting: Medical

## 2015-11-20 NOTE — Progress Notes (Signed)
This encounter was created in error - please disregard.

## 2015-11-22 ENCOUNTER — Telehealth: Payer: Self-pay

## 2015-11-22 NOTE — Telephone Encounter (Signed)
Will evaluate his rt ear when he is tomorrow. Then may refer if needed.

## 2015-11-22 NOTE — Telephone Encounter (Signed)
Spoke with Ray Church at [facility] ear, and he states that he is a hearing specialist and they preformed a series of hearing tests on Mr. Stamatis and Shanon Brow stated that Mr. Killman's left ear looked good and he was able to hear with that ear better with that side. Shanon Brow states that the right ear looks congested and the pt may have a possible infection. Mr. Florene Glen would like the pt to be evaluated at Mr.Oquin's primary care. If he needs to be referred to ENT then Shanon Brow recommends Dr. Vicie Mutters. Pt has an appointment tomorrow morning with pcp.

## 2015-11-23 ENCOUNTER — Ambulatory Visit (INDEPENDENT_AMBULATORY_CARE_PROVIDER_SITE_OTHER): Payer: Medicare Other | Admitting: Medical

## 2015-11-23 ENCOUNTER — Encounter: Payer: Self-pay | Admitting: Medical

## 2015-11-23 ENCOUNTER — Telehealth: Payer: Self-pay | Admitting: Medical

## 2015-11-23 VITALS — BP 120/80 | HR 84 | Temp 98.1°F | Ht 68.0 in | Wt 181.0 lb

## 2015-11-23 DIAGNOSIS — H6691 Otitis media, unspecified, right ear: Secondary | ICD-10-CM | POA: Diagnosis not present

## 2015-11-23 DIAGNOSIS — H9191 Unspecified hearing loss, right ear: Secondary | ICD-10-CM

## 2015-11-23 MED ORDER — AMOXICILLIN-POT CLAVULANATE 875-125 MG PO TABS: 1.0000 | ORAL_TABLET | Freq: Two times a day (BID) | ORAL | Status: DC

## 2015-11-23 NOTE — Progress Notes (Signed)
   Subjective:    Patient ID: Brandon Mason, male    DOB: 1925/07/10, 82 y.o.   MRN: [id number]  HPI  Pt in for rt ear problems. Pt had poor hearing due to possible fluid in ear. Pt states decreased hearing. No fever, no chills. No recent uri or allergy type symptoms. He has been swimming recenlty at [employer]. Pt audiologist wanted him evaluated here.  Pt sent over by audiologist. Audiologist thought pt may need referral to Dr. Thornell Mule.  Pt states audiologist would not give him hearing aid since test not accurate presently.    Review of Systems  Constitutional: Negative for fever, chills and fatigue.  HENT: Negative for congestion, ear pain, postnasal drip, rhinorrhea, sinus pressure and sore throat.        Decreased hearing  Respiratory: Negative for cough and chest tightness.   Cardiovascular: Negative for chest pain and palpitations.  Gastrointestinal: Negative for abdominal pain.  Musculoskeletal: Negative for back pain.  Neurological: Negative for dizziness and headaches.  Hematological: Negative for adenopathy. Does not bruise/bleed easily.  Psychiatric/Behavioral: Negative for confusion.       Objective:   Physical Exam  General  Mental Status - Alert. General Appearance - Well groomed. Not in acute distress.  Skin Rashes- No Rashes.  HEENT Head- Normal. Ear Auditory Canal - Left- Normal. Right - small wax at base of tm.Tympanic Membrane- Left- Normal. Right birght red Eye Sclera/Conjunctiva- Left- Normal. Right- Normal. Nose & Sinuses Nasal Mucosa- Left-  Boggy and Congested. Right-  Boggy and  Congested.Bilateral maxillary and frontal sinus pressure. Mouth & Throat Lips: Upper Lip- Normal: no dryness, cracking, pallor, cyanosis, or vesicular eruption. Lower Lip-Normal: no dryness, cracking, pallor, cyanosis or vesicular eruption. Buccal Mucosa- Bilateral- No Aphthous ulcers. Oropharynx- No Discharge or Erythema. Tonsils: Characteristics- Bilateral- No Erythema or  Congestion. Size/Enlargement- Bilateral- No enlargement. Discharge- bilateral-None.  Neck Neck- Supple. No Masses.   Chest and Lung Exam Auscultation: Breath Sounds:-Clear even and unlabored.  Cardiovascular Auscultation:Rythm- Regular, rate and rhythm. Murmurs & Other Heart Sounds:Ausculatation of the heart reveal- No Murmurs.  Lymphatic Head & Neck General Head & Neck Lymphatics: Bilateral: Description- No Localized lymphadenopathy.       Assessment & Plan:  You do appear to have rt OM by exam. I will go ahead and give augmentin antibiotic and then refer you to ENT after the course of the antibiotic.  Then after ENT may get you back in with Audiologist.  Follow up with me in 3-4 wks or as needed  Grabiela Wohlford, Percell Miller, [employer]

## 2015-11-23 NOTE — Patient Instructions (Addendum)
You do appear to have rt OM by exam. I will go ahead and give augmentin antibiotic and then refer you to ENT after the course of the antibiotic.  Then after ENT may get you back in with Audiologist.  Follow up with me in 3-4 wks or as needed

## 2015-11-23 NOTE — Telephone Encounter (Signed)
Referral faxed to [facility] ENT/awaiting appt

## 2015-11-23 NOTE — Progress Notes (Signed)
Pre visit review using our clinic review tool, if applicable. No additional management support is needed unless otherwise documented below in the visit note. 

## 2015-11-23 NOTE — Telephone Encounter (Signed)
Pt want ent referral in [place]

## 2015-11-24 ENCOUNTER — Telehealth: Payer: Self-pay | Admitting: *Deleted

## 2015-11-24 NOTE — Telephone Encounter (Signed)
Forwarded to [employer]. JG//CMA

## 2015-11-24 NOTE — Telephone Encounter (Signed)
No charge. 

## 2015-11-24 NOTE — Telephone Encounter (Signed)
Pt was no show 11/20/15 for acute appt, pt came in 11/23/15, charge or no charge?

## 2015-12-14 ENCOUNTER — Encounter: Payer: Self-pay | Admitting: Neurology

## 2015-12-14 ENCOUNTER — Ambulatory Visit (INDEPENDENT_AMBULATORY_CARE_PROVIDER_SITE_OTHER): Payer: Medicare Other | Admitting: Neurology

## 2015-12-14 VITALS — BP 124/86 | HR 75 | Ht 68.0 in | Wt 179.0 lb

## 2015-12-14 DIAGNOSIS — G2581 Restless legs syndrome: Secondary | ICD-10-CM | POA: Diagnosis not present

## 2015-12-14 DIAGNOSIS — G609 Hereditary and idiopathic neuropathy, unspecified: Secondary | ICD-10-CM | POA: Diagnosis not present

## 2015-12-14 MED ORDER — TRAMADOL HCL 50 MG PO TABS: 50.0000 mg | ORAL_TABLET | Freq: Two times a day (BID) | ORAL | Status: DC

## 2015-12-14 NOTE — Addendum Note (Signed)
Addended byAnnamaria Helling on: 12/14/2015 10:28 AM   Modules accepted: Orders

## 2015-12-14 NOTE — Progress Notes (Signed)
Brandon Mason was seen today in neurologic consultation at the request of Dr. Brett Fairy.  His PCP is Saguier, Percell Miller, [name].  The consultation is for the evaluation of RLS.  This is a second opinion only.  I reviewed Dr. Edwena Felty records.    The patient is a 82 y.o. year old male with a history of RLS.  The first symptom began 5+ years ago.   It has gotten worse over the last 6 months.  The patient states that he awakens in the AM and feels that he has muscle soreness in the calves and some in the arms and he is having cramping in the feet and calves at night.  He also has a crawling sensation and tingling sensation in the feet and legs to just above the knee.  It is better when he stands and walks and the more he moves it the better.  However, it did not used to involve the entire leg; it has "creeped" up there with time.   Higher dosages of requip may be a little more efficacious but caused EDS (4 mg bid) and at lower dosages of requip (2 mg bid) that hasn't been as helpful but he is still very sleepy.  He literally has attacks of sleep.  He is on neurontin 600 mg, and takes 2 of them at night and it does help the tingling.  He doesn't think that makes him too sleepy.  He has never tried that during the day.  He was on a patch (? Neupro) and it didn't help.  He thinks that he tried mirapex.  He doesn't remember trying klonopin.  Tramadol was listed as being tried but he doesn't remember that.  He has tried levodopa without success as well.    He did have a "stroke" in may.  States that he had transient left sided weakness.    09/13/15 update:  The patient is following up today.  I have not seen him in over one year, at which point I was just providing a second opinion for Dr. Brett Fairy.  At that point, I thought that it was not only restless leg causing his symptoms, but also a component of peripheral neuropathy.  I had recommended that he had daytime gabapentin or change to Lyrica or Cymbalta.  He had an  EMG done on 08/18/2014 by Dr. Jaynee Eagles and it was reported to show both acute and chronic denervation and this was felt consistent with axonal peripheral neuropathy as well as a chronic right lumbar radiculopathy.  He ultimately changed his gabapentin to Lyrica and worked up to 100 mg twice a day and ended up stopping it, stating that it did not help.  It does not appear that he was on this long.  It was subsequently changed to Cymbalta and that did not help and he changed back to gabapentin.  He remains on ropinirole for restless leg.  States that he comes today because of loss of balance and continued paresthesias but the paresthesias are now "all day long."  States that he is on gabapentin, 600mg , 2 in the AM and then nothing else the rest of the day.  He takes requip 2 mg, tid.  He doesn't think that the requip is helping.  States that over the last 3-4 months he is having burning paresthesias up to his knees and "it doesn't stop."  It is day and night.  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  He has not done PT since his stroke nearly 2 years ago.    12/14/15 update:  The patient has a history of peripheral neuropathy and restless leg, but last visit most of his symptoms sounded consistent with peripheral neuropathy.  I changed him from gabapentin, 1200 mg in the morning to gabapentin, 600 mg 3 times per day.  He remains on Requip, 2 mg 3 times per day.  He doesn't think it is helpful.  "when I wake up in the AM, I start jumping."  He is very frustrated and depressed over this.  Also c/o hand paresthesias.  He did have blood work at our last visit and while his RPR was positive, the confirmatory FTA-ABS was negative.  His hemoglobin A1c was 6.0.  Serum and urinary protein electrophoresis were negative for any monoclonal proteins.  PREVIOUS MEDICATIONS: mirapex, requip (on now, higher dosages with sleep attacks), levodopa,  neurontin at bedtime only, neupro, ?tramadol, cymbalta, lyrica  ALLERGIES:  No Known Allergies  CURRENT MEDICATIONS:  Outpatient Encounter Prescriptions as of 12/14/2015  Medication Sig  . albuterol (PROVENTIL HFA;VENTOLIN HFA) 108 (90 Base) MCG/ACT inhaler Inhale 2 puffs into the lungs every 6 (six) hours as needed for wheezing or shortness of breath.  Marland Kitchen aspirin 325 MG tablet Take 325 mg by mouth daily.  . Calcium Carb-Cholecalciferol (CALCIUM 600 + D PO) Take 1 capsule by mouth 1 day or 1 dose.  . furosemide (LASIX) 40 MG tablet Take 40 mg by mouth.  . gabapentin (NEURONTIN) 600 MG tablet Take 1 tablet (600 mg total) by mouth 3 (three) times daily.  Marland Kitchen lisinopril (PRINIVIL,ZESTRIL) 20 MG tablet Take 20 mg by mouth.  Marland Kitchen rOPINIRole (REQUIP) 2 MG tablet Take 1 tablet (2 mg total) by mouth 3 (three) times daily.  . vitamin B-12 (CYANOCOBALAMIN) 1000 MCG tablet Take 1,000 mcg by mouth daily.  Marland Kitchen Zoster Vaccine Live, PF, (ZOSTAVAX) 09811 UNT/0.65ML injection   . [DISCONTINUED] amoxicillin-clavulanate (AUGMENTIN) 875-125 MG tablet Take 1 tablet by mouth 2 (two) times daily.   No facility-administered encounter medications on file as of 12/14/2015.    PAST MEDICAL HISTORY:   Past Medical History  Diagnosis Date  . Hypertension   . Shortness of breath     with exertion   . Arthritis   . Prostate disorder   . Cardiomegaly   . Chronic kidney disease     BPH  . Neuromuscular disorder (HCC)     restless leg syndrome   . Hypoxia     pulmonary  . Nocturia   . Hypersomnia   . Chronic leg pain   . RLS (restless legs syndrome)     x 40 years  . Abnormality of gait 02/09/2013  . Restless legs syndrome (RLS) 02/09/2013  . Restless legs syndrome with nocturnal myoclonus   . UARS (upper airway resistance syndrome) 06/28/2014    PAST SURGICAL HISTORY:   Past Surgical History  Procedure Laterality Date  . Hernia repair    . Back surgery  2003    L4-5  . Transurethral resection of prostate   03/24/2012    Procedure: TRANSURETHRAL RESECTION OF THE PROSTATE (TURP);  Surgeon: Fredricka Bonine, MD;  Location: [facility];  Service: Urology;  Laterality: N/A;  with gyrus ,Greenlight PVP laser of Prostate  . Transurethral prostatectomy with gyrus instruments  04/28/2012    Procedure: TRANSURETHRAL PROSTATECTOMY WITH GYRUS INSTRUMENTS;  Surgeon: Fredricka Bonine, MD;  Location:  [facility];  Service: Urology;  Laterality: N/A;  . Cataract extraction      SOCIAL HISTORY:   Social History   Social History  . Marital Status: Married    Spouse Name: [name]  . Number of Children: 5  . Years of Education: 12   Occupational History  . retired      [name] for a [employer]  . Smoking status: Never Smoker   . Smokeless tobacco: Never Used  . Alcohol Use: 1.2 oz/week    2 Cans of beer per week     Comment: 2 cans of beer per week   . Drug Use: No  . Sexual Activity: Not on file   Other Topics Concern  . Not on file   Social History Narrative   Patient lives at home with his wife [name].   Patient is retired.    Patient has 5 children.   Patient has a high school education.   Patient is right-handed.   Patient drinks very little caffeine.    FAMILY HISTORY:   Family Status  Relation Status Death Age  . Mother Deceased 24    heart problems, RLS  . Father Deceased 72    heart problems  . Daughter Alive     RLS, healthy  . Daughter Alive     healthy  . Son Alive     healthy  . Son Alive     healthy  . Son Alive     healthy    ROS:  A complete 10 system review of systems was obtained and was unremarkable apart from what is mentioned above.  PHYSICAL EXAMINATION:    VITALS:   Filed Vitals:   12/14/15 1006  BP: 124/86  Pulse: 75  Height: 5\' 8"  (1.727 m)  Weight: 179 lb (81.194 kg)    GEN:  Normal appears male in no acute distress.  Appears stated age. HEENT:  Normocephalic, atraumatic. The mucous membranes are moist. The  superficial temporal arteries are without ropiness or tenderness. Cardiovascular: Regular rate and rhythm. Lungs: Clear to auscultation bilaterally. Neck/Heme: There are no carotid bruits noted bilaterally.  NEUROLOGICAL: Orientation:  The patient is alert and oriented x 3.   Cranial nerves: There is good facial symmetry.   Speech is fluent and clear. Soft palate rises symmetrically and there is no tongue deviation. Hearing is intact to conversational tone. Tone: Tone is good throughout. Sensation: Sensation is intact to light touch and pinprick throughout (facial, trunk, extremities). Pinprick is not decreased in a stocking distribution today, although pt stated was last visit.  Vibration is markedly decreased in a distal fashion. There is no extinction with double simultaneous stimulation. There is no sensory dermatomal level identified. Coordination:  The patient has no difficulty with RAM's or FNF bilaterally. Motor: Strength is 5/5 in the bilateral upper and lower extremities.  Shoulder shrug is equal and symmetric. There is no pronator drift.  There are no fasciculations noted. Gait and Station: The patient is unsteady and wide based.  He has significant difficulty ambulating in a tandem fashion.  Able to stand in romberg position with eyes open but not with eyes closed.  Lab Results  Component Value Date   [id number] 09/13/2015   Lab Results  Component Value Date   HGBA1C 6.0* 09/13/2015      IMPRESSION/PLAN  1. Paresthesias due to PN and RLS  -continue gabapentin from 600mg - 1 po tid.  May need to  use in combo therapy in the future.  Has tried lyrica and cymbalta in the past but not for long and never in combo.  If I am unsuccessful, I may refer him to Dr. Posey Pronto as he is very depressed over sx's and convinced he doesn't want to live like this (not suicidal but feels hopeless)  -discussed safety in detail 2.  RLS  -think that requip is causing augmentation and will wean off.   Has been on multiple other meds.  Will try ultram, 50 mg bid.  Discussed lowering of seizure threshold.  Risks, benefits, side effects and alternative therapies were discussed.  The opportunity to ask questions was given and they were answered to the best of my ability.  The patient expressed understanding and willingness to follow the outlined treatment protocols. 3.  Will call him in 6 weeks to see how he is doing.

## 2015-12-14 NOTE — Patient Instructions (Signed)
1. Continue Gabapentin 600 mg - 1 tablet three times daily. 2. Discontinue Requip as follows: Take 1/2 tablet twice daily for one week, then 1/2 tablet daily for one week, then stop.  3. Start Ultram 50 mg - 1 tablet twice daily.  4. We will talk in 6 weeks to see how you are doing.

## 2016-01-01 ENCOUNTER — Other Ambulatory Visit: Payer: Self-pay | Admitting: Neurology

## 2016-01-01 ENCOUNTER — Telehealth: Payer: Self-pay | Admitting: Neurology

## 2016-01-01 DIAGNOSIS — G2581 Restless legs syndrome: Secondary | ICD-10-CM

## 2016-01-01 DIAGNOSIS — G479 Sleep disorder, unspecified: Secondary | ICD-10-CM

## 2016-01-01 MED ORDER — GABAPENTIN 600 MG PO TABS: 600.0000 mg | ORAL_TABLET | Freq: Three times a day (TID) | ORAL | Status: DC

## 2016-01-01 NOTE — Telephone Encounter (Signed)
PT called in regards to stopping his medication and taking a new one/Dawn CB#[phone]

## 2016-01-01 NOTE — Telephone Encounter (Signed)
Gabapentin refill requested. Per last office note- patient to remain on medication. Refill approved and sent to patient's pharmacy.   

## 2016-01-01 NOTE — Telephone Encounter (Signed)
Patient states his RLS is unbearable. Ultram not helping. I let him know he could take Requip today and tomorrow to get through holiday since he called so late that a provider was not available because he states he "would not make it" til Wednesday. Denies SI. Please advise.

## 2016-01-01 NOTE — Telephone Encounter (Signed)
Can we refer to sleep medicine at [facility].  I am not sure what else to offer him except opioid therapy and we are non opioid prescribing practice

## 2016-01-03 NOTE — Telephone Encounter (Signed)
Tried to call - phone busy. Will try again later.

## 2016-01-03 NOTE — Telephone Encounter (Signed)
Patient made aware. Referral sent to [facility].

## 2016-01-04 DIAGNOSIS — D485 Neoplasm of uncertain behavior of skin: Secondary | ICD-10-CM | POA: Diagnosis not present

## 2016-01-04 DIAGNOSIS — L905 Scar conditions and fibrosis of skin: Secondary | ICD-10-CM | POA: Diagnosis not present

## 2016-01-04 DIAGNOSIS — Z8603 Personal history of neoplasm of uncertain behavior: Secondary | ICD-10-CM | POA: Diagnosis not present

## 2016-01-04 DIAGNOSIS — C44319 Basal cell carcinoma of skin of other parts of face: Secondary | ICD-10-CM | POA: Diagnosis not present

## 2016-01-04 DIAGNOSIS — L578 Other skin changes due to chronic exposure to nonionizing radiation: Secondary | ICD-10-CM | POA: Diagnosis not present

## 2016-01-04 DIAGNOSIS — L570 Actinic keratosis: Secondary | ICD-10-CM | POA: Diagnosis not present

## 2016-01-24 ENCOUNTER — Telehealth: Payer: Self-pay | Admitting: Neurology

## 2016-01-24 NOTE — Telephone Encounter (Signed)
Spoke with patient. Dr. Doristine Devoid last office note and EMG from 2016 faxed to Dr. Maryellen Pile per patient's request.

## 2016-02-01 DIAGNOSIS — C44319 Basal cell carcinoma of skin of other parts of face: Secondary | ICD-10-CM | POA: Diagnosis not present

## 2016-02-22 DIAGNOSIS — G629 Polyneuropathy, unspecified: Secondary | ICD-10-CM | POA: Diagnosis not present

## 2016-02-22 DIAGNOSIS — R202 Paresthesia of skin: Secondary | ICD-10-CM | POA: Diagnosis not present

## 2016-02-22 DIAGNOSIS — R270 Ataxia, unspecified: Secondary | ICD-10-CM | POA: Diagnosis not present

## 2016-02-22 DIAGNOSIS — R208 Other disturbances of skin sensation: Secondary | ICD-10-CM | POA: Diagnosis not present

## 2016-02-22 DIAGNOSIS — G2581 Restless legs syndrome: Secondary | ICD-10-CM | POA: Diagnosis not present

## 2016-03-06 DIAGNOSIS — M2578 Osteophyte, vertebrae: Secondary | ICD-10-CM | POA: Diagnosis not present

## 2016-03-06 DIAGNOSIS — M4806 Spinal stenosis, lumbar region: Secondary | ICD-10-CM | POA: Diagnosis not present

## 2016-03-06 DIAGNOSIS — M1288 Other specific arthropathies, not elsewhere classified, other specified site: Secondary | ICD-10-CM | POA: Diagnosis not present

## 2016-03-06 DIAGNOSIS — R93422 Abnormal radiologic findings on diagnostic imaging of left kidney: Secondary | ICD-10-CM | POA: Diagnosis not present

## 2016-03-06 DIAGNOSIS — M5021 Other cervical disc displacement,  high cervical region: Secondary | ICD-10-CM | POA: Diagnosis not present

## 2016-03-06 DIAGNOSIS — M47896 Other spondylosis, lumbar region: Secondary | ICD-10-CM | POA: Diagnosis not present

## 2016-03-06 DIAGNOSIS — M4807 Spinal stenosis, lumbosacral region: Secondary | ICD-10-CM | POA: Diagnosis not present

## 2016-03-06 DIAGNOSIS — M4312 Spondylolisthesis, cervical region: Secondary | ICD-10-CM | POA: Diagnosis not present

## 2016-03-06 DIAGNOSIS — R93421 Abnormal radiologic findings on diagnostic imaging of right kidney: Secondary | ICD-10-CM | POA: Diagnosis not present

## 2016-03-06 DIAGNOSIS — M4802 Spinal stenosis, cervical region: Secondary | ICD-10-CM | POA: Diagnosis not present

## 2016-03-06 DIAGNOSIS — M9971 Connective tissue and disc stenosis of intervertebral foramina of cervical region: Secondary | ICD-10-CM | POA: Diagnosis not present

## 2016-03-06 DIAGNOSIS — M5136 Other intervertebral disc degeneration, lumbar region: Secondary | ICD-10-CM | POA: Diagnosis not present

## 2016-03-06 DIAGNOSIS — M5030 Other cervical disc degeneration, unspecified cervical region: Secondary | ICD-10-CM | POA: Diagnosis not present

## 2016-03-06 DIAGNOSIS — M9973 Connective tissue and disc stenosis of intervertebral foramina of lumbar region: Secondary | ICD-10-CM | POA: Diagnosis not present

## 2016-03-06 DIAGNOSIS — M47812 Spondylosis without myelopathy or radiculopathy, cervical region: Secondary | ICD-10-CM | POA: Diagnosis not present

## 2016-03-25 ENCOUNTER — Other Ambulatory Visit: Payer: Self-pay | Admitting: Neurosurgery

## 2016-03-25 DIAGNOSIS — M4712 Other spondylosis with myelopathy, cervical region: Secondary | ICD-10-CM | POA: Diagnosis not present

## 2016-03-25 DIAGNOSIS — M5021 Other cervical disc displacement,  high cervical region: Secondary | ICD-10-CM | POA: Diagnosis not present

## 2016-03-25 DIAGNOSIS — I10 Essential (primary) hypertension: Secondary | ICD-10-CM | POA: Diagnosis not present

## 2016-03-25 DIAGNOSIS — Z6827 Body mass index (BMI) 27.0-27.9, adult: Secondary | ICD-10-CM | POA: Diagnosis not present

## 2016-03-28 ENCOUNTER — Encounter (HOSPITAL_COMMUNITY): Payer: Self-pay | Admitting: *Deleted

## 2016-03-28 NOTE — Progress Notes (Signed)
Pt recently saw a cardiologist, Dr. Wyline Copas for an abnormal EKG. Pt states he was told everything was ok. Requested copy of Echo, EKG and stress test from Dr. Rhetta Mura office. Visit is noted in [place]. Pt denies any recent chest pain or sob.

## 2016-03-29 ENCOUNTER — Ambulatory Visit (HOSPITAL_COMMUNITY)
Admission: RE | Admit: 2016-03-29 | Discharge: 2016-03-30 | Disposition: A | Discharge status: Home / Self Care | Payer: Medicare Other | Source: Ambulatory Visit | Attending: Neurosurgery | Admitting: Neurosurgery

## 2016-03-29 ENCOUNTER — Encounter (HOSPITAL_COMMUNITY): Payer: Self-pay | Admitting: Anesthesiology

## 2016-03-29 ENCOUNTER — Inpatient Hospital Stay (HOSPITAL_COMMUNITY): Payer: Medicare Other

## 2016-03-29 ENCOUNTER — Inpatient Hospital Stay (HOSPITAL_COMMUNITY): Payer: Medicare Other | Admitting: Anesthesiology

## 2016-03-29 ENCOUNTER — Encounter (HOSPITAL_COMMUNITY)
Admission: RE | Disposition: A | Discharge status: Home / Self Care | Payer: Self-pay | Source: Ambulatory Visit | Attending: Neurosurgery

## 2016-03-29 DIAGNOSIS — Z7982 Long term (current) use of aspirin: Secondary | ICD-10-CM | POA: Insufficient documentation

## 2016-03-29 DIAGNOSIS — I10 Essential (primary) hypertension: Secondary | ICD-10-CM | POA: Diagnosis not present

## 2016-03-29 DIAGNOSIS — N289 Disorder of kidney and ureter, unspecified: Secondary | ICD-10-CM | POA: Diagnosis not present

## 2016-03-29 DIAGNOSIS — Z8546 Personal history of malignant neoplasm of prostate: Secondary | ICD-10-CM | POA: Insufficient documentation

## 2016-03-29 DIAGNOSIS — M5136 Other intervertebral disc degeneration, lumbar region: Secondary | ICD-10-CM | POA: Diagnosis not present

## 2016-03-29 DIAGNOSIS — M5000 Cervical disc disorder with myelopathy, unspecified cervical region: Secondary | ICD-10-CM | POA: Diagnosis present

## 2016-03-29 DIAGNOSIS — M5001 Cervical disc disorder with myelopathy,  high cervical region: Secondary | ICD-10-CM | POA: Diagnosis not present

## 2016-03-29 DIAGNOSIS — N189 Chronic kidney disease, unspecified: Secondary | ICD-10-CM | POA: Diagnosis not present

## 2016-03-29 DIAGNOSIS — M1990 Unspecified osteoarthritis, unspecified site: Secondary | ICD-10-CM | POA: Diagnosis not present

## 2016-03-29 DIAGNOSIS — M5021 Other cervical disc displacement,  high cervical region: Secondary | ICD-10-CM | POA: Diagnosis not present

## 2016-03-29 DIAGNOSIS — M4712 Other spondylosis with myelopathy, cervical region: Secondary | ICD-10-CM | POA: Diagnosis not present

## 2016-03-29 DIAGNOSIS — Z419 Encounter for procedure for purposes other than remedying health state, unspecified: Secondary | ICD-10-CM

## 2016-03-29 DIAGNOSIS — Z981 Arthrodesis status: Secondary | ICD-10-CM | POA: Diagnosis not present

## 2016-03-29 HISTORY — DX: Malignant (primary) neoplasm, unspecified: C80.1

## 2016-03-29 HISTORY — DX: Sleep apnea, unspecified: G47.30

## 2016-03-29 HISTORY — DX: Cerebral infarction, unspecified: I63.9

## 2016-03-29 HISTORY — PX: ANTERIOR CERVICAL DECOMP/DISCECTOMY FUSION: SHX1161

## 2016-03-29 LAB — CBC
HEMATOCRIT: 35.9 % — AB (ref 39.0–52.0)
Hemoglobin: 11.6 g/dL — ABNORMAL LOW (ref 13.0–17.0)
MCH: 32.5 pg (ref 26.0–34.0)
MCHC: 32.3 g/dL (ref 30.0–36.0)
MCV: 100.6 fL — AB (ref 78.0–100.0)
Platelets: 225 10*3/uL (ref 150–400)
RBC: 3.57 MIL/uL — AB (ref 4.22–5.81)
RDW: 12.9 % (ref 11.5–15.5)
WBC: 4.8 10*3/uL (ref 4.0–10.5)

## 2016-03-29 LAB — BASIC METABOLIC PANEL
Anion gap: 5 (ref 5–15)
BUN: 25 mg/dL — ABNORMAL HIGH (ref 6–20)
CHLORIDE: 110 mmol/L (ref 101–111)
CO2: 27 mmol/L (ref 22–32)
CREATININE: 0.98 mg/dL (ref 0.61–1.24)
Calcium: 8.8 mg/dL — ABNORMAL LOW (ref 8.9–10.3)
GFR calc non Af Amer: >60 mL/min (ref >60)
Glucose, Bld: 98 mg/dL (ref 65–99)
POTASSIUM: 3.7 mmol/L (ref 3.5–5.1)
SODIUM: 142 mmol/L (ref 135–145)

## 2016-03-29 LAB — SURGICAL PCR SCREEN
MRSA, PCR: NEGATIVE
Staphylococcus aureus: NEGATIVE

## 2016-03-29 SURGERY — ANTERIOR CERVICAL DECOMPRESSION/DISCECTOMY FUSION 1 LEVEL
Anesthesia: General

## 2016-03-29 MED ORDER — PHENYLEPHRINE HCL 10 MG/ML IJ SOLN
INTRAVENOUS | Status: DC | PRN
  Administered 2016-03-29: 20 ug/min via INTRAVENOUS

## 2016-03-29 MED ORDER — THROMBIN 5000 UNITS EX SOLR
CUTANEOUS | Status: DC | PRN
  Administered 2016-03-29 (×2): 5000 [IU] via TOPICAL

## 2016-03-29 MED ORDER — MUPIROCIN 2 % EX OINT
1.0000 "application " | TOPICAL_OINTMENT | Freq: Once | CUTANEOUS | Status: AC
  Administered 2016-03-29: 1 via TOPICAL

## 2016-03-29 MED ORDER — LISINOPRIL 20 MG PO TABS: 20.0000 mg | ORAL_TABLET | Freq: Every day | ORAL | Status: DC

## 2016-03-29 MED ORDER — FUROSEMIDE 40 MG PO TABS: 40.0000 mg | ORAL_TABLET | Freq: Every day | ORAL | Status: DC

## 2016-03-29 MED ORDER — SODIUM CHLORIDE 0.9% FLUSH: 3.0000 mL | INTRAVENOUS | Status: DC | PRN

## 2016-03-29 MED ORDER — LIDOCAINE HCL (CARDIAC) 20 MG/ML IV SOLN
INTRAVENOUS | Status: DC | PRN
  Administered 2016-03-29: 20 mg via INTRAVENOUS
  Administered 2016-03-29: 80 mg via INTRAVENOUS

## 2016-03-29 MED ORDER — CEFAZOLIN SODIUM-DEXTROSE 2-4 GM/100ML-% IV SOLN
INTRAVENOUS | Status: AC
  Filled 2016-03-29: qty 100

## 2016-03-29 MED ORDER — GABAPENTIN 600 MG PO TABS
600.0000 mg | ORAL_TABLET | Freq: Three times a day (TID) | ORAL | Status: DC
  Administered 2016-03-29: 600 mg via ORAL
  Filled 2016-03-29: qty 1

## 2016-03-29 MED ORDER — FENTANYL CITRATE (PF) 100 MCG/2ML IJ SOLN
INTRAMUSCULAR | Status: AC
  Filled 2016-03-29: qty 2

## 2016-03-29 MED ORDER — SUCCINYLCHOLINE CHLORIDE 20 MG/ML IJ SOLN
INTRAMUSCULAR | Status: DC | PRN
  Administered 2016-03-29: 100 mg via INTRAVENOUS

## 2016-03-29 MED ORDER — ACETAMINOPHEN 650 MG RE SUPP: 650.0000 mg | RECTAL | Status: DC | PRN

## 2016-03-29 MED ORDER — 0.9 % SODIUM CHLORIDE (POUR BTL) OPTIME
TOPICAL | Status: DC | PRN
  Administered 2016-03-29: 1000 mL

## 2016-03-29 MED ORDER — CHLORHEXIDINE GLUCONATE CLOTH 2 % EX PADS: 6.0000 | MEDICATED_PAD | Freq: Once | CUTANEOUS | Status: DC

## 2016-03-29 MED ORDER — SUGAMMADEX SODIUM 200 MG/2ML IV SOLN
INTRAVENOUS | Status: DC | PRN
  Administered 2016-03-29: 200 mg via INTRAVENOUS

## 2016-03-29 MED ORDER — DOCUSATE SODIUM 100 MG PO CAPS
100.0000 mg | ORAL_CAPSULE | Freq: Two times a day (BID) | ORAL | Status: DC
  Administered 2016-03-29: 100 mg via ORAL
  Filled 2016-03-29: qty 1

## 2016-03-29 MED ORDER — TRAMADOL HCL 50 MG PO TABS
50.0000 mg | ORAL_TABLET | Freq: Two times a day (BID) | ORAL | Status: DC
  Administered 2016-03-29: 50 mg via ORAL
  Filled 2016-03-29: qty 1

## 2016-03-29 MED ORDER — POTASSIUM CHLORIDE IN NACL 20-0.9 MEQ/L-% IV SOLN: INTRAVENOUS | Status: DC

## 2016-03-29 MED ORDER — ACETAMINOPHEN 325 MG PO TABS: 650.0000 mg | ORAL_TABLET | ORAL | Status: DC | PRN

## 2016-03-29 MED ORDER — ROCURONIUM BROMIDE 100 MG/10ML IV SOLN
INTRAVENOUS | Status: DC | PRN
  Administered 2016-03-29: 50 mg via INTRAVENOUS

## 2016-03-29 MED ORDER — ASPIRIN EC 325 MG PO TBEC: 325.0000 mg | DELAYED_RELEASE_TABLET | Freq: Every day | ORAL | Status: DC

## 2016-03-29 MED ORDER — LIDOCAINE-EPINEPHRINE 0.5 %-1:200000 IJ SOLN
INTRAMUSCULAR | Status: DC | PRN
  Administered 2016-03-29: 4 mL

## 2016-03-29 MED ORDER — PROPOFOL 10 MG/ML IV BOLUS
INTRAVENOUS | Status: DC | PRN
  Administered 2016-03-29: 130 mg via INTRAVENOUS

## 2016-03-29 MED ORDER — ALBUTEROL SULFATE (2.5 MG/3ML) 0.083% IN NEBU: 2.5000 mg | INHALATION_SOLUTION | Freq: Four times a day (QID) | RESPIRATORY_TRACT | Status: DC | PRN

## 2016-03-29 MED ORDER — HYDROCODONE-ACETAMINOPHEN 5-325 MG PO TABS
1.0000 | ORAL_TABLET | ORAL | Status: DC | PRN
  Administered 2016-03-29: 2 via ORAL
  Filled 2016-03-29: qty 2

## 2016-03-29 MED ORDER — ARTIFICIAL TEARS OP OINT
TOPICAL_OINTMENT | OPHTHALMIC | Status: DC | PRN
Start: 2016-03-29 — End: 2016-03-29
  Administered 2016-03-29: 1 via OPHTHALMIC

## 2016-03-29 MED ORDER — DEXAMETHASONE SODIUM PHOSPHATE 10 MG/ML IJ SOLN
INTRAMUSCULAR | Status: DC | PRN
  Administered 2016-03-29: 10 mg via INTRAVENOUS

## 2016-03-29 MED ORDER — LACTATED RINGERS IV SOLN
INTRAVENOUS | Status: DC
  Administered 2016-03-29: 10:00:00 via INTRAVENOUS

## 2016-03-29 MED ORDER — PROPOFOL 10 MG/ML IV BOLUS
INTRAVENOUS | Status: AC
  Filled 2016-03-29: qty 20

## 2016-03-29 MED ORDER — PROMETHAZINE HCL 25 MG/ML IJ SOLN
6.2500 mg | INTRAMUSCULAR | Status: DC | PRN
Start: 2016-03-29 — End: 2016-03-29

## 2016-03-29 MED ORDER — ONDANSETRON HCL 4 MG/2ML IJ SOLN
INTRAMUSCULAR | Status: DC | PRN
  Administered 2016-03-29: 4 mg via INTRAVENOUS

## 2016-03-29 MED ORDER — CEFAZOLIN SODIUM-DEXTROSE 2-4 GM/100ML-% IV SOLN
2.0000 g | INTRAVENOUS | Status: AC
Start: 2016-03-29 — End: 2016-03-29
  Administered 2016-03-29: 2 g via INTRAVENOUS

## 2016-03-29 MED ORDER — ONDANSETRON HCL 4 MG/2ML IJ SOLN: 4.0000 mg | INTRAMUSCULAR | Status: DC | PRN

## 2016-03-29 MED ORDER — MUPIROCIN 2 % EX OINT
TOPICAL_OINTMENT | CUTANEOUS | Status: AC
  Filled 2016-03-29: qty 22

## 2016-03-29 MED ORDER — MENTHOL 3 MG MT LOZG
1.0000 | LOZENGE | OROMUCOSAL | Status: DC | PRN
Start: 2016-03-29 — End: 2016-03-30

## 2016-03-29 MED ORDER — LACTATED RINGERS IV SOLN
INTRAVENOUS | Status: DC | PRN
  Administered 2016-03-29 (×2): via INTRAVENOUS

## 2016-03-29 MED ORDER — DEXTROSE 5 % IV SOLN
INTRAVENOUS | Status: DC | PRN
  Administered 2016-03-29: 14:00:00 via INTRAVENOUS

## 2016-03-29 MED ORDER — HEMOSTATIC AGENTS (NO CHARGE) OPTIME
TOPICAL | Status: DC | PRN
  Administered 2016-03-29: 1 via TOPICAL

## 2016-03-29 MED ORDER — CALCIUM CARBONATE-VITAMIN D 500-200 MG-UNIT PO TABS: 1.0000 | ORAL_TABLET | Freq: Every morning | ORAL | Status: DC

## 2016-03-29 MED ORDER — PHENOL 1.4 % MT LIQD: 1.0000 | OROMUCOSAL | Status: DC | PRN

## 2016-03-29 MED ORDER — SODIUM CHLORIDE 0.9% FLUSH
3.0000 mL | Freq: Two times a day (BID) | INTRAVENOUS | Status: DC
  Administered 2016-03-29: 3 mL via INTRAVENOUS

## 2016-03-29 MED ORDER — FENTANYL CITRATE (PF) 100 MCG/2ML IJ SOLN
INTRAMUSCULAR | Status: DC | PRN
  Administered 2016-03-29 (×2): 50 ug via INTRAVENOUS

## 2016-03-29 MED ORDER — VITAMIN B-12 1000 MCG PO TABS
1000.0000 ug | ORAL_TABLET | Freq: Every day | ORAL | Status: DC
  Administered 2016-03-29: 1000 ug via ORAL
  Filled 2016-03-29 (×2): qty 1

## 2016-03-29 MED ORDER — FENTANYL CITRATE (PF) 100 MCG/2ML IJ SOLN: 25.0000 ug | INTRAMUSCULAR | Status: DC | PRN

## 2016-03-29 SURGICAL SUPPLY — 68 items
BIT DRILL NEURO 2X3.1 SFT TUCH (MISCELLANEOUS) ×1 IMPLANT
BLADE CLIPPER SURG (BLADE) IMPLANT
BNDG GAUZE ELAST 4 BULKY (GAUZE/BANDAGES/DRESSINGS) IMPLANT
BUR DRUM 4.0 (BURR) IMPLANT
BUR DRUM 4.0MM (BURR)
CANISTER SUCT 3000ML PPV (MISCELLANEOUS) ×3 IMPLANT
DECANTER SPIKE VIAL GLASS SM (MISCELLANEOUS) ×3 IMPLANT
DERMABOND ADVANCED (GAUZE/BANDAGES/DRESSINGS) ×2
DERMABOND ADVANCED .7 DNX12 (GAUZE/BANDAGES/DRESSINGS) ×1 IMPLANT
DRAPE HALF SHEET 40X57 (DRAPES) IMPLANT
DRAPE LAPAROTOMY 100X72 PEDS (DRAPES) ×3 IMPLANT
DRAPE MICROSCOPE LEICA (MISCELLANEOUS) ×3 IMPLANT
DRAPE POUCH INSTRU U-SHP 10X18 (DRAPES) ×3 IMPLANT
DRILL NEURO 2X3.1 SOFT TOUCH (MISCELLANEOUS) ×3
DURAPREP 6ML APPLICATOR 50/CS (WOUND CARE) ×3 IMPLANT
ELECT COATED BLADE 2.86 ST (ELECTRODE) ×3 IMPLANT
ELECT REM PT RETURN 9FT ADLT (ELECTROSURGICAL) ×3
ELECTRODE REM PT RTRN 9FT ADLT (ELECTROSURGICAL) ×1 IMPLANT
GAUZE SPONGE 4X4 16PLY XRAY LF (GAUZE/BANDAGES/DRESSINGS) IMPLANT
GLOVE BIO SURGEON STRL SZ 6.5 (GLOVE) IMPLANT
GLOVE BIO SURGEON STRL SZ7 (GLOVE) IMPLANT
GLOVE BIO SURGEON STRL SZ7.5 (GLOVE) IMPLANT
GLOVE BIO SURGEON STRL SZ8 (GLOVE) IMPLANT
GLOVE BIO SURGEON STRL SZ8.5 (GLOVE) IMPLANT
GLOVE BIO SURGEONS STRL SZ 6.5 (GLOVE)
GLOVE BIOGEL M 8.0 STRL (GLOVE) IMPLANT
GLOVE ECLIPSE 6.5 STRL STRAW (GLOVE) ×3 IMPLANT
GLOVE ECLIPSE 7.0 STRL STRAW (GLOVE) IMPLANT
GLOVE ECLIPSE 7.5 STRL STRAW (GLOVE) IMPLANT
GLOVE ECLIPSE 8.0 STRL XLNG CF (GLOVE) IMPLANT
GLOVE ECLIPSE 8.5 STRL (GLOVE) IMPLANT
GLOVE EXAM NITRILE LRG STRL (GLOVE) IMPLANT
GLOVE EXAM NITRILE XL STR (GLOVE) IMPLANT
GLOVE EXAM NITRILE XS STR PU (GLOVE) IMPLANT
GLOVE INDICATOR 6.5 STRL GRN (GLOVE) IMPLANT
GLOVE INDICATOR 7.0 STRL GRN (GLOVE) ×3 IMPLANT
GLOVE INDICATOR 7.5 STRL GRN (GLOVE) IMPLANT
GLOVE INDICATOR 8.0 STRL GRN (GLOVE) IMPLANT
GLOVE INDICATOR 8.5 STRL (GLOVE) IMPLANT
GLOVE OPTIFIT SS 8.0 STRL (GLOVE) IMPLANT
GLOVE SS N UNI LF 6.5 STRL (GLOVE) ×3 IMPLANT
GLOVE SURG SS PI 6.5 STRL IVOR (GLOVE) IMPLANT
GOWN STRL REUS W/ TWL LRG LVL3 (GOWN DISPOSABLE) ×2 IMPLANT
GOWN STRL REUS W/ TWL XL LVL3 (GOWN DISPOSABLE) IMPLANT
GOWN STRL REUS W/TWL 2XL LVL3 (GOWN DISPOSABLE) IMPLANT
GOWN STRL REUS W/TWL LRG LVL3 (GOWN DISPOSABLE) ×4
GOWN STRL REUS W/TWL XL LVL3 (GOWN DISPOSABLE)
KIT BASIN OR (CUSTOM PROCEDURE TRAY) ×3 IMPLANT
KIT ROOM TURNOVER OR (KITS) ×3 IMPLANT
NEEDLE HYPO 25X1 1.5 SAFETY (NEEDLE) ×3 IMPLANT
NEEDLE SPNL 22GX3.5 QUINCKE BK (NEEDLE) ×3 IMPLANT
NS IRRIG 1000ML POUR BTL (IV SOLUTION) ×3 IMPLANT
PACK LAMINECTOMY NEURO (CUSTOM PROCEDURE TRAY) ×3 IMPLANT
PAD ARMBOARD 7.5X6 YLW CONV (MISCELLANEOUS) ×9 IMPLANT
PIN DISTRACTION 14MM (PIN) ×6 IMPLANT
PLATE HELIX-R 24MM (Plate) ×3 IMPLANT
RUBBERBAND STERILE (MISCELLANEOUS) ×6 IMPLANT
SCREW 4.0X13 (Screw) ×4 IMPLANT
SCREW 4.0X13MM (Screw) ×8 IMPLANT
SPACER ACF PARALLEL 7MM (Bone Implant) ×3 IMPLANT
SPONGE INTESTINAL PEANUT (DISPOSABLE) ×3 IMPLANT
SPONGE SURGIFOAM ABS GEL SZ50 (HEMOSTASIS) ×3 IMPLANT
SUT VIC AB 0 CT1 27 (SUTURE) ×2
SUT VIC AB 0 CT1 27XBRD ANTBC (SUTURE) ×1 IMPLANT
SUT VIC AB 3-0 SH 8-18 (SUTURE) ×3 IMPLANT
TOWEL OR 17X24 6PK STRL BLUE (TOWEL DISPOSABLE) ×3 IMPLANT
TOWEL OR 17X26 10 PK STRL BLUE (TOWEL DISPOSABLE) ×3 IMPLANT
WATER STERILE IRR 1000ML POUR (IV SOLUTION) ×3 IMPLANT

## 2016-03-29 NOTE — H&P (Signed)
BP (!) 179/83   Pulse 68   Temp 97.5 F (36.4 C) (Oral)   Resp 16   Ht 5\' 8"  (1.727 m)   Wt 79.4 kg (175 lb)   SpO2 96%   BMI 26.61 kg/m    Mr. Sebree is an 82 year old gentleman who presents today for evaluation of increasing numbness and tingling in his hands, loss of strength in his grips bilaterally, and loss of balance when he is walking.  He says over the last six months this has progressed fairly rapidly.  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  He has had no bowel or bladder dysfunction.      PAST SURGICAL HISTORY:                  Both he and his wife have been operated on by me in the past.  I took out a ruptured disc in 2003.      PAST MEDICAL HISTORY:                    He has had prostate cancer and has had problems with a hernia.     DATA:                                                  MRI was performed of his cervical spine and lumbar regions.  In the lumbar region, he has multiple layers of disc degeneration and foraminal narrowing.  He does not have a recurrent disc herniation.  In the cervical spine, he has multiple layers of spondylytic change, but only one area of cord compression, that being at C3-4 where there is cord signal present and it is both anterior and posterior.  He is fishmouthed at C3-4 anteriorly.          INTERVAL PFSH:                                  He does drink socially.  He is right-handed.  Mother died at age 42 and father died at age 39, heart disease and hypertension in both cases were present.      REVIEW OF SYSTEMS:                        He has pain in his arm, weakness in his legs, numbness and tingling in the fingers and feet.  His weight has been climbing recently.  He reports cataracts, hearing loss, tinnitus, balance problems,  shortness of breath, swelling in the feet and hands, leg pain with walking, breast pain, breast swelling, skin disease, joint pain, leg pain, arm pain, back pain, leg weakness, arm weakness, coordination problems in his legs and hands, anxiety, and depression.  He says it takes a long time to button his shirt at this time.      MEDICATIONS:                         He currently takes Gabapentin, Tramadol, Lisinopril, Lasix, and  Aspirin.      PHYSICAL EXAMINATION:                    Vital signs:  He is 5 feet 8 inches, weight is 178.6 pounds, temperature is 98.7, blood pressure is 157/81, pulse is 81, respiratory rate is 12, pain is 7/10.  On examination, Mr. Prenatt is alert, oriented x4, and he answers all questions appropriately.  Memory, language, attention span, and fund of knowledge is normal.  Speech is clear, it is also fluent.  Tongue and uvula are in the midline.  Shoulder shrug is normal.  Hearing is intact to voice, though it is diminished.  Strength is quite poor in the grips bilaterally at 4/5.  Biceps and triceps are much closer to normal, 5-/5, as are deltoids.  In the lower extremities, he has good strength.  When he stands, some slight swaying with eyes closed.  Therefore, making it a positive Romberg.  He can tandem walk, but with some slight difficulty.  Reflexes are 3+ brachial radialis, biceps, triceps, knees, and ankles.  Positive Hoffman's sign.  No clonus.      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  I do believe an anterior cervical decompression and subsequent arthrodesis would relieve the pressure on his spinal cord.  As I explained to him, the true purpose of the operation is to ensure that he does not get worse.  I would think improvement will come with decompression of the canal, but the real goal is to make sure he does not get worse.  However, risks including paralysis,  increased weakness in or both arms, both legs, bowel or bladder dysfunction, loss of control of bowel and bladder function, paralysis all are present, as is given his age a fusion failure or hardware failure of some sort in the cervical spine.  He understands and wishes to proceed.  I also gave him a detailed instruction sheet with regard to this.

## 2016-03-29 NOTE — Anesthesia Procedure Notes (Addendum)
Procedure Name: Intubation Date/Time: 03/29/2016 1:37 PM Performed by: Jacquiline Doe A Pre-anesthesia Checklist: Patient identified, Emergency Drugs available, Suction available and Patient being monitored Patient Re-evaluated:Patient Re-evaluated prior to inductionOxygen Delivery Method: Circle System Utilized and Circle system utilized Preoxygenation: Pre-oxygenation with 100% oxygen Intubation Type: IV induction and Cricoid Pressure applied Ventilation: Mask ventilation without difficulty Laryngoscope Size: Mac, 4 and Glidescope Grade View: Grade I Tube type: Oral Tube size: 7.5 mm Number of attempts: 1 Airway Equipment and Method: Oral airway,  LTA kit utilized,  Rigid stylet and Video-laryngoscopy Placement Confirmation: ETT inserted through vocal cords under direct vision,  positive ETCO2 and breath sounds checked- equal and bilateral Secured at: 23 cm Tube secured with: Tape Dental Injury: Teeth and Oropharynx as per pre-operative assessment  Comments: Occiput /Neck kept neutral position throughout induction .

## 2016-03-29 NOTE — Anesthesia Preprocedure Evaluation (Addendum)
Anesthesia Evaluation  Patient identified by MRN, date of birth, ID band Patient awake    Reviewed: Allergy & Precautions, H&P , NPO status , Patient's Chart, lab work & pertinent test results  Airway Mallampati: II  TM Distance: >3 FB Neck ROM: Full    Dental  (+) Partial Lower, Poor Dentition   Pulmonary shortness of breath, sleep apnea and Continuous Positive Airway Pressure Ventilation ,  Per pulmonology note a few months ago, PFTs indicative of restrictive lung disease, however patient refused further treatment of this disease other than albuterol inhaler. Refused steroidal treatment.    Pulmonary exam normal breath sounds clear to auscultation       Cardiovascular hypertension, Pt. on medications Normal cardiovascular exam Rhythm:Regular Rate:Normal  Per cardiology note earlier this year, stress echo was negative for ischemia and no further w/u was warrented.    Neuro/Psych negative neurological ROS  negative psych ROS   GI/Hepatic negative GI ROS, Neg liver ROS,   Endo/Other  negative endocrine ROS  Renal/GU Renal InsufficiencyRenal disease  negative genitourinary   Musculoskeletal negative musculoskeletal ROS (+)   Abdominal   Peds negative pediatric ROS (+)  Hematology negative hematology ROS (+)   Anesthesia Other Findings   Reproductive/Obstetrics negative OB ROS                            Anesthesia Physical  Anesthesia Plan  ASA: III  Anesthesia Plan: General   Post-op Pain Management:    Induction: Intravenous  Airway Management Planned: Oral ETT and Video Laryngoscope Planned  Additional Equipment:   Intra-op Plan:   Post-operative Plan: Extubation in OR  Informed Consent: I have reviewed the patients History and Physical, chart, labs and discussed the procedure including the risks, benefits and alternatives for the proposed anesthesia with the patient or  authorized representative who has indicated his/her understanding and acceptance.   Dental advisory given  Plan Discussed with: CRNA and Surgeon  Anesthesia Plan Comments:        Anesthesia Quick Evaluation

## 2016-03-29 NOTE — Transfer of Care (Signed)
Immediate Anesthesia Transfer of Care Note  Patient: Brandon Mason  Procedure(s) Performed: Procedure(s): ANTERIOR CERVICAL DECOMPRESSION/DISCECTOMY FUSION CERVICAL THREE- CERVICAL FOUR (N/A)  Patient Location: PACU  Anesthesia Type:General  Level of Consciousness: awake, sedated, patient cooperative and responds to stimulation  Airway & Oxygen Therapy: Patient Spontanous Breathing and Patient connected to face mask oxygen  Post-op Assessment: Report given to RN, Post -op Vital signs reviewed and stable, Patient moving all extremities and Patient moving all extremities X 4  Post vital signs: Reviewed and stable  Last Vitals:  Vitals:   03/29/16 1543 03/29/16 1545  BP: (!) 157/92   Pulse: 77 79  Resp: 20 20  Temp: 36.3 C     Last Pain:  Vitals:   03/29/16 0857  TempSrc: Oral         Complications: No apparent anesthesia complications

## 2016-03-29 NOTE — Anesthesia Postprocedure Evaluation (Signed)
Anesthesia Post Note  Patient: Jemal Griffeth  Procedure(s) Performed: Procedure(s) (LRB): ANTERIOR CERVICAL DECOMPRESSION/DISCECTOMY FUSION CERVICAL THREE- CERVICAL FOUR (N/A)  Patient location during evaluation: PACU Anesthesia Type: General Level of consciousness: awake and alert Pain management: pain level controlled Vital Signs Assessment: post-procedure vital signs reviewed and stable Respiratory status: spontaneous breathing, nonlabored ventilation, respiratory function stable and patient connected to nasal cannula oxygen Cardiovascular status: blood pressure returned to baseline and stable Postop Assessment: no signs of nausea or vomiting Anesthetic complications: no    Last Vitals:  Vitals:   03/29/16 1600 03/29/16 1615  BP: (!) 158/100 (!) 175/87  Pulse: 79 75  Resp: (!) 25   Temp:      Last Pain:  Vitals:   03/29/16 1615  TempSrc:   PainSc: 0-No pain                 Reginal Lutes

## 2016-03-29 NOTE — Op Note (Signed)
03/29/2016  3:40 PM  PATIENT:  Brandon Mason  81 y.o. male with cervical hnp and myelopathy due to a herniated disc at C3/4.   PRE-OPERATIVE DIAGNOSIS:  Cervical 3/4 hnp with myelopathy  POST-OPERATIVE DIAGNOSIS:  Cervical 3/4 hnp with myelopathy  PROCEDURE:  Anterior Cervical decompression C3/4 Arthrodesis C3/4 with 22mm structural allograft Anterior instrumentation(Nuvasive) C3/4  SURGEON:   Surgeon(s): Ashok Pall, MD   ASSISTANTS:Jenkins, Dellis Filbert  ANESTHESIA:   general  EBL:  Total I/O In: 600 [I.V.:600] Out: 50 [Blood:50]  BLOOD ADMINISTERED:none  CELL SAVER GIVEN:none  COUNT:per nursing  DRAINS: none   SPECIMEN:  No Specimen  DICTATION: [name] was taken to the operating room, intubated, and placed under general anesthesia without difficulty. He was positioned supine with his head in slight extension on a horseshoe headrest. The neck was prepped and draped in a sterile manner. I infiltrated 4 cc's 1/2%lidocaine/1:200,000 strength epinephrine into the planned incision starting from the midline to the medial border of the left sternocleidomastoid muscle. I opened the incision with a 10 blade and dissected sharply through soft tissue to the platysma. I dissected in the plane superior to the platysma both rostrally and caudally. I then opened the platysma in a horizontal fashion with Metzenbaum scissors, and dissected in the inferior plane rostrally and caudally. With both blunt and sharp technique I created an avascular corridor to the cervical spine. I placed a spinal needle(s) in the disc space at C3/4 . I then reflected the longus colli from C3 to C4 and placed self retaining retractors. I opened the disc space(s) at 3/4 with a 15 blade. I removed disc with curettes, Kerrison punches, and the drill. Using the drill I removed osteophytes and prepared for the decompression.  I decompressed the spinal canal and the C4 root(s) with the drill, Kerrison punches, and the  curettes. I used the microscope to aid in microdissection. I removed the posterior longitudinal ligament to fully expose and decompress the thecal sac. I exposed the roots laterally taking down the 3/4 uncovertebral joints. With the decompression complete we moved on to the arthrodesis. I used the drill to level the surfaces of C3 and C4. I removed soft tissue to prepare the disc space and the bony surfaces. I measured the space and placed a 16mm structural allograft into the disc space.  We then placed the anterior instrumentation. I placed 2 screws in each vertebral body through the plate. I locked the screws into place. Intraoperative xray showed the graft, plate, and screws to be in good position. I irrigated the wound, achieved hemostasis, and closed the wound in layers. I approximated the platysma, and the subcuticular plane with vicryl sutures. I used Dermabond for a sterile dressing.   PLAN OF CARE: Admit for overnight observation  PATIENT DISPOSITION:  PACU - hemodynamically stable.   Delay start of Pharmacological VTE agent (>24hrs) due to surgical blood loss or risk of bleeding:  yes

## 2016-03-30 DIAGNOSIS — M5001 Cervical disc disorder with myelopathy,  high cervical region: Secondary | ICD-10-CM | POA: Diagnosis not present

## 2016-03-30 DIAGNOSIS — Z8546 Personal history of malignant neoplasm of prostate: Secondary | ICD-10-CM | POA: Diagnosis not present

## 2016-03-30 DIAGNOSIS — N289 Disorder of kidney and ureter, unspecified: Secondary | ICD-10-CM | POA: Diagnosis not present

## 2016-03-30 DIAGNOSIS — Z7982 Long term (current) use of aspirin: Secondary | ICD-10-CM | POA: Diagnosis not present

## 2016-03-30 DIAGNOSIS — M5136 Other intervertebral disc degeneration, lumbar region: Secondary | ICD-10-CM | POA: Diagnosis not present

## 2016-03-30 DIAGNOSIS — I10 Essential (primary) hypertension: Secondary | ICD-10-CM | POA: Diagnosis not present

## 2016-03-30 LAB — HEMOGLOBIN A1C
Hgb A1c MFr Bld: 5.8 % — ABNORMAL HIGH (ref 4.8–5.6)
MEAN PLASMA GLUCOSE: 120 mg/dL

## 2016-03-30 NOTE — Progress Notes (Signed)
Patient alert and oriented, mae's well, voiding adequate amount of urine, swallowing without difficulty, no c/o pain. Patient discharged home with family. Script and discharged instructions given to patient. Patient and family stated understanding of d/c instructions given and has an appointment with MD. 

## 2016-03-30 NOTE — Discharge Instructions (Signed)

## 2016-03-30 NOTE — Evaluation (Signed)
Physical Therapy Evaluation Patient Details Name: Brandon Mason MRN: [id number] DOB: [birth year] Today's Date: 03/30/2016   History of Present Illness  Patient is a 82 y/o male with hx of CVA, HTN, hypersomnia, CKD, cancer presents s/p C3-4 ACDF.  Clinical Impression  Patient presents with post surgical deficits and pain s/p above surgery. Tolerated gait training and stair training with supervision for safety. Education re: cervical precautions, positioning, activity progression etc. Pt will have support from wife at home. Will follow acutely to maximize independence and mobility prior to return home. Would benefit from OPPT once cleared by surgeon in the future.    Follow Up Recommendations No PT follow up;Supervision - Intermittent    Equipment Recommendations  None recommended by PT    Recommendations for Other Services       Precautions / Restrictions Precautions Precautions: Cervical Precaution Comments: Provided handout and reviewed precautions. Restrictions Weight Bearing Restrictions: No      Mobility  Bed Mobility Overal bed mobility: Needs Assistance Bed Mobility: Rolling;Sidelying to Sit;Sit to Sidelying Rolling: Modified independent (Device/Increase time) Sidelying to sit: Modified independent (Device/Increase time);HOB elevated     Sit to sidelying: Modified independent (Device/Increase time);HOB elevated General bed mobility comments: Pt's bed elevates at home; Cues for log roll technique.   Transfers Overall transfer level: Needs assistance Equipment used: None Transfers: Sit to/from Stand Sit to Stand: Supervision         General transfer comment: Supervision for safety. no dizziness.   Ambulation/Gait Ambulation/Gait assistance: Supervision Ambulation Distance (Feet): 250 Feet Assistive device: None Gait Pattern/deviations: Step-through pattern;Decreased stride length;Drifts right/left   Gait velocity interpretation: at or above normal speed for  age/gender General Gait Details: Slow, mostly steady gait with mild imbalance noted. No overt LOB. No dizziness.   Stairs Stairs: Yes Stairs assistance: Supervision Stair Management: Alternating pattern;One rail Right Number of Stairs: 3 General stair comments: Cues for technique and safety.   Wheelchair Mobility    Modified Rankin (Stroke Patients Only)       Balance Overall balance assessment: Needs assistance Sitting-balance support: Feet supported;No upper extremity supported Sitting balance-Leahy Scale: Good     Standing balance support: During functional activity Standing balance-Leahy Scale: Fair                               Pertinent Vitals/Pain Pain Assessment: Faces Faces Pain Scale: Hurts a little bit Pain Location: neck Pain Descriptors / Indicators: Sore;Operative site guarding Pain Intervention(s): Monitored during session;Repositioned    Home Living Family/patient expects to be discharged to:: Private residence Living Arrangements: Spouse/significant other Available Help at Discharge: Family;Available 24 hours/day Type of Home: House Home Access: Stairs to enter Entrance Stairs-Rails: Right Entrance Stairs-Number of Steps: 2 Home Layout: One level Home Equipment: Walker - 2 wheels;Shower seat - built in      Prior Function Level of Independence: Independent with assistive device(s)         Comments: Pt uses RW at night when feeling off balance. Independent with ambulation in household.     Hand Dominance   Dominant Hand: Right    Extremity/Trunk Assessment   Upper Extremity Assessment: Defer to OT evaluation (Weakness BUEs esp with bil grip. Difficulty opening jars etc. Decreased sensation BUEs.)           Lower Extremity Assessment: Generalized weakness (Decreased sensation BLEs and into feet. )      Cervical / Trunk Assessment: Other exceptions  Communication  Communication: No difficulties  Cognition  Arousal/Alertness: Awake/alert Behavior During Therapy: WFL for tasks assessed/performed Overall Cognitive Status: Within Functional Limits for tasks assessed                      General Comments General comments (skin integrity, edema, etc.): Wife present during session.    Exercises     Assessment/Plan    PT Assessment Patient needs continued PT services  PT Problem List Decreased strength;Decreased mobility;Decreased balance;Decreased knowledge of precautions;Decreased activity tolerance;Pain;Impaired sensation          PT Treatment Interventions DME instruction;Therapeutic activities;Gait training;Therapeutic exercise;Patient/family education;Balance training;Stair training;Functional mobility training    PT Goals (Current goals can be found in the Care Plan section)  Acute Rehab PT Goals Patient Stated Goal: to be able to walk straight with the veterans at funerals PT Goal Formulation: With patient Time For Goal Achievement: 04/13/16 Potential to Achieve Goals: Good    Frequency Min 5X/week   Barriers to discharge        Co-evaluation               End of Session Equipment Utilized During Treatment: Gait belt Activity Tolerance: Patient tolerated treatment well Patient left: in bed;with call bell/phone within reach;with family/visitor present Nurse Communication: Mobility status    Functional Assessment Tool Used: clinical judgment Functional Limitation: Mobility: Walking and moving around Mobility: Walking and Moving Around Current Status [phone]): At least 1 percent but less than 20 percent impaired, limited or restricted Mobility: Walking and Moving Around Goal Status [phone]): At least 1 percent but less than 20 percent impaired, limited or restricted    Time: 0821-0837 PT Time Calculation (min) (ACUTE ONLY): 16 min   Charges:   PT Evaluation $PT Eval Low Complexity: 1 Procedure     PT G Codes:   PT G-Codes **NOT FOR INPATIENT  CLASS** Functional Assessment Tool Used: clinical judgment Functional Limitation: Mobility: Walking and moving around Mobility: Walking and Moving Around Current Status [phone]): At least 1 percent but less than 20 percent impaired, limited or restricted Mobility: Walking and Moving Around Goal Status [phone]): At least 1 percent but less than 20 percent impaired, limited or restricted    [place] 03/30/2016, 9:06 AM Wray Kearns, PT, DPT [phone]

## 2016-03-30 NOTE — Discharge Summary (Signed)
Physician Discharge Summary  Patient ID: Brandon Mason MRN: [id number] DOB/AGE: 1925-11-01 82 y.o.  Admit date: 03/29/2016 Discharge date: 03/30/2016  Admission Diagnoses:hnp with myelopathy, C3/4  Discharge Diagnoses:  Active Problems:   HNP (herniated nucleus pulposus) with myelopathy, cervical   Discharged Condition: good  Hospital Course: Mr. Gulden was admitted and taken to the hospital for an uncomplicated ACDF at [id number] Helix r plate) He has done well, eating normally, voiding. Wound is clean,soft, dry, without signs of infection. He is moving all extremities well. No follow necessary.  Treatments: surgery: as above  Discharge Exam: Blood pressure 127/70, pulse 79, temperature 98.4 F (36.9 C), resp. rate 18, height 5\' 8"  (1.727 m), weight 79.4 kg (175 lb), SpO2 95 %. General appearance: alert, cooperative, appears stated age and no distress  Disposition: 01-Home or Self Care OSTEOARTHRITIS OF CERVICAL SPINE WITH MYELOPATY    Medication List    TAKE these medications   albuterol 108 (90 Base) MCG/ACT inhaler Commonly known as:  PROVENTIL HFA;VENTOLIN HFA Inhale 2 puffs into the lungs every 6 (six) hours as needed for wheezing or shortness of breath.   aspirin 325 MG tablet Take 325 mg by mouth daily.   CALCIUM 600 + D PO Take 1 capsule by mouth daily.   furosemide 40 MG tablet Commonly known as:  LASIX Take 40 mg by mouth daily.   gabapentin 600 MG tablet Commonly known as:  NEURONTIN Take 1 tablet (600 mg total) by mouth 3 (three) times daily.   lisinopril 20 MG tablet Commonly known as:  PRINIVIL,ZESTRIL Take 20 mg by mouth daily.   traMADol 50 MG tablet Commonly known as:  ULTRAM Take 1 tablet (50 mg total) by mouth 2 (two) times daily.   vitamin B-12 1000 MCG tablet Commonly known as:  CYANOCOBALAMIN Take 1,000 mcg by mouth daily.      Follow-up Information    Telma Pyeatt L, MD. Call in 1 week(s).   Specialty:  Neurosurgery Why:   please call to make an appointment for 3 weeks Contact information: [address] [place] [address] [phone]           Signed: Winfield Cunas 03/30/2016, 9:04 AM

## 2016-04-01 ENCOUNTER — Encounter (HOSPITAL_COMMUNITY): Payer: Self-pay | Admitting: Neurosurgery

## 2016-04-02 ENCOUNTER — Emergency Department (HOSPITAL_COMMUNITY)
Admission: EM | Admit: 2016-04-02 | Discharge: 2016-04-03 | Disposition: A | Discharge status: Home / Self Care | Payer: Medicare Other | Attending: Emergency Medicine | Admitting: Emergency Medicine

## 2016-04-02 ENCOUNTER — Encounter (HOSPITAL_COMMUNITY): Payer: Self-pay | Admitting: Emergency Medicine

## 2016-04-02 ENCOUNTER — Emergency Department (HOSPITAL_COMMUNITY): Payer: Medicare Other

## 2016-04-02 DIAGNOSIS — Z79899 Other long term (current) drug therapy: Secondary | ICD-10-CM | POA: Insufficient documentation

## 2016-04-02 DIAGNOSIS — Z8673 Personal history of transient ischemic attack (TIA), and cerebral infarction without residual deficits: Secondary | ICD-10-CM | POA: Diagnosis not present

## 2016-04-02 DIAGNOSIS — R404 Transient alteration of awareness: Secondary | ICD-10-CM | POA: Diagnosis not present

## 2016-04-02 DIAGNOSIS — I129 Hypertensive chronic kidney disease with stage 1 through stage 4 chronic kidney disease, or unspecified chronic kidney disease: Secondary | ICD-10-CM | POA: Insufficient documentation

## 2016-04-02 DIAGNOSIS — E860 Dehydration: Secondary | ICD-10-CM | POA: Diagnosis not present

## 2016-04-02 DIAGNOSIS — M542 Cervicalgia: Secondary | ICD-10-CM | POA: Diagnosis not present

## 2016-04-02 DIAGNOSIS — N189 Chronic kidney disease, unspecified: Secondary | ICD-10-CM | POA: Insufficient documentation

## 2016-04-02 DIAGNOSIS — I6523 Occlusion and stenosis of bilateral carotid arteries: Secondary | ICD-10-CM | POA: Diagnosis not present

## 2016-04-02 DIAGNOSIS — R339 Retention of urine, unspecified: Secondary | ICD-10-CM | POA: Diagnosis not present

## 2016-04-02 DIAGNOSIS — J90 Pleural effusion, not elsewhere classified: Secondary | ICD-10-CM | POA: Diagnosis not present

## 2016-04-02 DIAGNOSIS — Z85828 Personal history of other malignant neoplasm of skin: Secondary | ICD-10-CM | POA: Insufficient documentation

## 2016-04-02 DIAGNOSIS — R531 Weakness: Secondary | ICD-10-CM | POA: Diagnosis not present

## 2016-04-02 LAB — CBC WITH DIFFERENTIAL/PLATELET
BASOS ABS: 0 10*3/uL (ref 0.0–0.1)
Basophils Relative: 0 %
Eosinophils Absolute: 0 10*3/uL (ref 0.0–0.7)
Eosinophils Relative: 0 %
HEMATOCRIT: 39.2 % (ref 39.0–52.0)
HEMOGLOBIN: 13.1 g/dL (ref 13.0–17.0)
LYMPHS PCT: 6 %
Lymphs Abs: 0.9 10*3/uL (ref 0.7–4.0)
MCH: 33.2 pg (ref 26.0–34.0)
MCHC: 33.4 g/dL (ref 30.0–36.0)
MCV: 99.2 fL (ref 78.0–100.0)
MONO ABS: 1.9 10*3/uL — AB (ref 0.1–1.0)
Monocytes Relative: 13 %
NEUTROS ABS: 12.4 10*3/uL — AB (ref 1.7–7.7)
Neutrophils Relative %: 81 %
Platelets: 234 10*3/uL (ref 150–400)
RBC: 3.95 MIL/uL — ABNORMAL LOW (ref 4.22–5.81)
RDW: 12.8 % (ref 11.5–15.5)
WBC: 15.3 10*3/uL — ABNORMAL HIGH (ref 4.0–10.5)

## 2016-04-02 LAB — COMPREHENSIVE METABOLIC PANEL
ALBUMIN: 3.2 g/dL — AB (ref 3.5–5.0)
ALT: 11 U/L — ABNORMAL LOW (ref 17–63)
AST: 13 U/L — AB (ref 15–41)
Alkaline Phosphatase: 54 U/L (ref 38–126)
Anion gap: 8 (ref 5–15)
BILIRUBIN TOTAL: 0.9 mg/dL (ref 0.3–1.2)
BUN: 17 mg/dL (ref 6–20)
CHLORIDE: 100 mmol/L — AB (ref 101–111)
CO2: 25 mmol/L (ref 22–32)
Calcium: 8.6 mg/dL — ABNORMAL LOW (ref 8.9–10.3)
Creatinine, Ser: 0.92 mg/dL (ref 0.61–1.24)
GFR calc Af Amer: >60 mL/min (ref >60)
GFR calc non Af Amer: >60 mL/min (ref >60)
GLUCOSE: 131 mg/dL — AB (ref 65–99)
POTASSIUM: 3.9 mmol/L (ref 3.5–5.1)
SODIUM: 133 mmol/L — AB (ref 135–145)
Total Protein: 6.7 g/dL (ref 6.5–8.1)

## 2016-04-02 LAB — URINALYSIS, ROUTINE W REFLEX MICROSCOPIC
Bilirubin Urine: NEGATIVE
GLUCOSE, UA: NEGATIVE mg/dL
Hgb urine dipstick: NEGATIVE
Ketones, ur: NEGATIVE mg/dL
LEUKOCYTES UA: NEGATIVE
Nitrite: NEGATIVE
PH: 7 (ref 5.0–8.0)
Protein, ur: 30 mg/dL — AB
SPECIFIC GRAVITY, URINE: 1.015 (ref 1.005–1.030)

## 2016-04-02 LAB — TROPONIN I: Troponin I: <0.03 ng/mL (ref <0.03)

## 2016-04-02 LAB — URINE MICROSCOPIC-ADD ON
RBC / HPF: NONE SEEN RBC/hpf (ref 0–5)
WBC, UA: NONE SEEN WBC/hpf (ref 0–5)

## 2016-04-02 LAB — LACTIC ACID, PLASMA: LACTIC ACID, VENOUS: 0.9 mmol/L (ref 0.5–1.9)

## 2016-04-02 MED ORDER — SODIUM CHLORIDE 0.9 % IV BOLUS (SEPSIS)
1000.0000 mL | Freq: Once | INTRAVENOUS | Status: AC
  Administered 2016-04-02: 1000 mL via INTRAVENOUS

## 2016-04-02 NOTE — ED Provider Notes (Signed)
[facility] DEPT Provider Note   CSN: [id number] Arrival date & time: 04/02/16  2200     History   Chief Complaint Chief Complaint  Patient presents with  . Weakness    HPI Brandon Mason is a 82 y.o. male.  Pt's family called EMS this evening because of confusion.  The pt had an ACDF (anterior cervical decompression/discectomy fusion) of C3/C4 on 9/29 by Dr. Christella Noa.  The pt was d/c'd on the 30th and had been doing well until today.  The pt just started taking the norco last night.  Per EMS, he has been confused today and had a fever at home.  He has not wanted to eat or drink today.  Pt c/o pain in his neck, but denies any other sx.      Past Medical History:  Diagnosis Date  . Abnormality of gait 02/09/2013  . Arthritis   . Cancer ([place])    basal cell skin cancer  . Cardiomegaly   . Chronic kidney disease    BPH  . Chronic leg pain   . Hypersomnia   . Hypertension   . Hypoxia    pulmonary  . Neuromuscular disorder ([facility])    restless leg syndrome   . Nocturia   . Prostate disorder   . Restless legs syndrome (RLS) 02/09/2013  . Restless legs syndrome with nocturnal myoclonus   . RLS (restless legs syndrome)    x 40 years  . Shortness of breath    with exertion   . Sleep apnea    uses cpap  . Stroke [facility])    minor stroke in 2003 - no lasting side effects  . UARS (upper airway resistance syndrome) 06/28/2014    Patient Active Problem List   Diagnosis Date Noted  . HNP (herniated nucleus pulposus) with myelopathy, cervical 03/29/2016  . UARS (upper airway resistance syndrome) 06/28/2014  . Restless leg syndrome, familial, uncontrolled 08/20/2013  . Restless legs syndrome with nocturnal myoclonus   . Abnormality of gait 02/09/2013  . Restless legs syndrome (RLS) 02/09/2013  . Acute kidney injury ([place]) 03/27/2012    Past Surgical History:  Procedure Laterality Date  . ANTERIOR CERVICAL DECOMP/DISCECTOMY FUSION N/A 03/29/2016   Procedure: ANTERIOR CERVICAL  DECOMPRESSION/DISCECTOMY FUSION CERVICAL THREE- CERVICAL FOUR;  Surgeon: Ashok Pall, MD;  Location: [facility];  Service: Neurosurgery;  Laterality: N/A;  . BACK SURGERY  2003   L4-5  . CATARACT EXTRACTION    . HERNIA REPAIR     inguinal hernia  . TRANSURETHRAL PROSTATECTOMY WITH GYRUS INSTRUMENTS  04/28/2012   Procedure: TRANSURETHRAL PROSTATECTOMY WITH GYRUS INSTRUMENTS;  Surgeon: Fredricka Bonine, MD;  Location: [facility];  Service: Urology;  Laterality: N/A;  . TRANSURETHRAL RESECTION OF PROSTATE  03/24/2012   Procedure: TRANSURETHRAL RESECTION OF THE PROSTATE (TURP);  Surgeon: Fredricka Bonine, MD;  Location: [facility];  Service: Urology;  Laterality: N/A;  with gyrus ,Greenlight PVP laser of Prostate       Home Medications    Prior to Admission medications   Medication Sig Start Date End Date Taking? Authorizing Provider  aspirin 325 MG tablet Take 325 mg by mouth daily.   Yes Historical Provider, MD  Calcium Carb-Cholecalciferol (CALCIUM 600 + D PO) Take 1 capsule by mouth daily.    Yes Historical Provider, MD  furosemide (LASIX) 40 MG tablet Take 40 mg by mouth daily.  07/31/15  Yes Historical Provider, MD  gabapentin (NEURONTIN) 600 MG tablet Take 1 tablet (600 mg total) by mouth  3 (three) times daily. 01/01/16  Yes Rebecca S Tat, DO  lisinopril (PRINIVIL,ZESTRIL) 20 MG tablet Take 20 mg by mouth daily.    Yes Historical Provider, MD  traMADol (ULTRAM) 50 MG tablet Take 1 tablet (50 mg total) by mouth 2 (two) times daily. 12/14/15  Yes Rebecca S Tat, DO  vitamin B-12 (CYANOCOBALAMIN) 1000 MCG tablet Take 1,000 mcg by mouth daily.   Yes Historical Provider, MD  albuterol (PROVENTIL HFA;VENTOLIN HFA) 108 (90 Base) MCG/ACT inhaler Inhale 2 puffs into the lungs every 6 (six) hours as needed for wheezing or shortness of breath. 11/16/15   Mackie Pai, [name]    Family History Family History  Problem Relation Age of Onset  . Restless legs syndrome Mother   . Restless legs  syndrome Daughter     Social History Social History  Substance Use Topics  . Smoking status: Never Smoker  . Smokeless tobacco: Never Used  . Alcohol use 1.2 oz/week    2 Cans of beer per week     Comment: 2 cans of beer per week      Allergies   Penicillin g   Review of Systems Review of Systems  Constitutional: Positive for appetite change and fever.  Musculoskeletal: Positive for neck pain.  All other systems reviewed and are negative.    Physical Exam Updated Vital Signs BP 167/92   Pulse 95   Temp 98.5 F (36.9 C) (Oral)   Resp 16   Ht 5\' 8"  (1.727 m)   Wt 175 lb (79.4 kg)   SpO2 92%   BMI 26.61 kg/m   Physical Exam  Constitutional: He is oriented to person, place, and time. He appears well-developed and well-nourished.  HENT:  Head: Normocephalic and atraumatic.  Right Ear: External ear normal.  Left Ear: External ear normal.  Nose: Nose normal.  Mouth/Throat: Oropharynx is clear and moist.  Eyes: Conjunctivae and EOM are normal. Pupils are equal, round, and reactive to light.  Neck:    Cardiovascular: Normal rate, regular rhythm, normal heart sounds and intact distal pulses.   Pulmonary/Chest: Effort normal and breath sounds normal.  Abdominal: Soft. Bowel sounds are normal.  Musculoskeletal: Normal range of motion.  Neurological: He is alert and oriented to person, place, and time.  Skin: Skin is warm.  Psychiatric: He has a normal mood and affect. His behavior is normal. Judgment and thought content normal.  Nursing note and vitals reviewed.    ED Treatments / Results  Labs (all labs ordered are listed, but only abnormal results are displayed) Labs Reviewed  COMPREHENSIVE METABOLIC PANEL - Abnormal; Notable for the following:       Result Value   Sodium 133 (*)    Chloride 100 (*)    Glucose, Bld 131 (*)    Calcium 8.6 (*)    Albumin 3.2 (*)    AST 13 (*)    ALT 11 (*)    All other components within normal limits  CBC WITH  DIFFERENTIAL/PLATELET - Abnormal; Notable for the following:    WBC 15.3 (*)    RBC 3.95 (*)    Neutro Abs 12.4 (*)    Monocytes Absolute 1.9 (*)    All other components within normal limits  URINALYSIS, ROUTINE W REFLEX MICROSCOPIC (NOT AT [facility]) - Abnormal; Notable for the following:    Protein, ur 30 (*)    All other components within normal limits  URINE MICROSCOPIC-ADD ON - Abnormal; Notable for the following:    Squamous  Epithelial / LPF 0-5 (*)    Bacteria, UA RARE (*)    All other components within normal limits  TROPONIN I  LACTIC ACID, PLASMA  LACTIC ACID, PLASMA    EKG  EKG Interpretation  Date/Time:  Tuesday April 02 2016 22:21:28 EDT Ventricular Rate:  96 PR Interval:    QRS Duration: 154 QT Interval:  405 QTC Calculation: 512 R Axis:   -70 Text Interpretation:  Sinus rhythm RBBB and LAFB Left ventricular hypertrophy No significant change since last tracing Confirmed by [facility] MD, Anes Rigel ([id number]) on 04/02/2016 10:24:13 PM       Radiology Dg Chest 2 View  Result Date: 04/03/2016 CLINICAL DATA:  Fever tonight. Recent neck surgery. Neck pain and body aches. EXAM: CHEST  2 VIEW COMPARISON:  11/16/2015 FINDINGS: Shallow inspiration with linear atelectasis in the lung bases. Cardiac enlargement. No pulmonary vascular congestion or consolidation. No pneumothorax. Soft tissue density in the right thoracic inlet displacing the trachea towards the left probably representing thyroid goiter. In the setting of recent neck surgery, cannot exclude a fluid collection causing this. Suggest CT chest for further evaluation. Small bilateral pleural effusions. IMPRESSION: Cardiac enlargement. Shallow inspiration with atelectasis in the lung bases. Small bilateral pleural effusions. Soft tissue density in the right peritracheal region displacing the trachea towards the left. This may represent thyroid goiter but in the setting of postoperative change, a fluid collection is not excluded.  Consider CT for further evaluation. Electronically Signed   By: Lucienne Capers M.D.   On: 04/03/2016 00:18    Procedures Procedures (including critical care time)  Medications Ordered in ED Medications  sodium chloride 0.9 % bolus 1,000 mL (0 mLs Intravenous Stopped 04/02/16 2319)     Initial Impression / Assessment and Plan / ED Course  I have reviewed the triage vital signs and the nursing notes.  Pertinent labs & imaging results that were available during my care of the patient were reviewed by me and considered in my medical decision making (see chart for details).  Clinical Course  Comment By Time  CT results are normal and discussed with the patient. The carotid stenosis has been discussed.  Pt now reports that he is having trouble urinating. We will get bladder scan.  With the recent neck surgery, we of course wondered about hematoma that might cause cord compression - but patient denies any associated numbness, weakness in the upper or lower extremities, urinary incontinence, bowel incontinence, saddle anesthesia. The urinary symptoms just started now - dont think he has UTI. His BP is high - missed his BP doses today, we will provide the night dose. No chest pain or dib. Varney Biles, MD 10/04 0256   Pt improved after 1L NS.  CXR results d/w pt and family.  We will proceed with CT neck.  The pt will be signed out to Dr. Kathrynn Humble pending results of scan.  Final Clinical Impressions(s) / ED Diagnoses   Final diagnoses:  Weakness  Dehydration    New Prescriptions New Prescriptions   No medications on file     Isla Pence, MD 04/03/16 1624

## 2016-04-02 NOTE — ED Notes (Signed)
Patient transported to X-ray 

## 2016-04-02 NOTE — ED Notes (Signed)
Pt BIB EMS from home. Pt had a neck fusion on Friday, released from hospital on Saturday. Reports weakness and lethargy since then, with nausea that has kept him from eating or drinking. Denies vomiting. Family recorded oral temp of 101F, but pt normothermic for EMS. Pt hasn't been taking normal BP meds either. Received 4mg  zofran from EMS.

## 2016-04-03 ENCOUNTER — Emergency Department (HOSPITAL_COMMUNITY): Payer: Medicare Other

## 2016-04-03 DIAGNOSIS — M542 Cervicalgia: Secondary | ICD-10-CM | POA: Diagnosis not present

## 2016-04-03 DIAGNOSIS — R531 Weakness: Secondary | ICD-10-CM | POA: Diagnosis not present

## 2016-04-03 MED ORDER — IOPAMIDOL (ISOVUE-300) INJECTION 61%
INTRAVENOUS | Status: AC
  Filled 2016-04-03: qty 75

## 2016-04-03 MED ORDER — IOPAMIDOL (ISOVUE-300) INJECTION 61%
75.0000 mL | Freq: Once | INTRAVENOUS | Status: AC | PRN
  Administered 2016-04-03: 75 mL via INTRAVENOUS

## 2016-04-03 MED ORDER — SODIUM CHLORIDE 0.9 % IV BOLUS (SEPSIS)
1000.0000 mL | Freq: Once | INTRAVENOUS | Status: AC
  Administered 2016-04-03: 1000 mL via INTRAVENOUS

## 2016-04-03 MED ORDER — FENTANYL CITRATE (PF) 100 MCG/2ML IJ SOLN
25.0000 ug | Freq: Once | INTRAMUSCULAR | Status: AC
  Administered 2016-04-03: 25 ug via INTRAVENOUS
  Filled 2016-04-03: qty 2

## 2016-04-03 NOTE — Discharge Instructions (Addendum)
We saw you in the ER for the weakness All the results in the ER are normal, labs and imaging -EXCEPT FOR THERE IS NARROWING OF THE BLOOD VESSELS OF THE NECK, WHICH WILL NEED FURTHER EVALUATION BY PRIMARY CARE DOCTOR.  The workup in the ER is not complete, and is limited to screening for life threatening and emergent conditions only, so please see a primary care doctor for further evaluation.  Please return to the ER if your symptoms worsen; you have increased pain, fevers, chills, inability to keep any medications down, confusion.   Also return to the ER if you have any new numbness, weakness, urinary incontinence, urinary retention, bowel incontinence, pins and needle sensation in your groin region.    CT RESULTS ARE AS FOLLOWING: IMPRESSION: Status post recent C3-4 ACDF with anterior neck effusion, no focal fluid collection. Patent airway.   Severe suspected bilateral internal carotid artery stenosis due to atherosclerosis which would be better characterized on CTA NECK on a nonemergent basis.

## 2016-04-03 NOTE — ED Notes (Signed)
Pt departed in NAD.  

## 2016-04-03 NOTE — ED Provider Notes (Addendum)
  Physical Exam  BP 167/92   Pulse 95   Temp 98.5 F (36.9 C) (Oral)   Resp 16   Ht 5\' 8"  (1.727 m)   Wt 175 lb (79.4 kg)   SpO2 92%   BMI 26.61 kg/m   Physical Exam  ED Course  Procedures  MDM  Pt is s/p discectomy. Comes in with cc of fevers and weakness at home. Pt's vitals are WNL. He has elevated WC. CXR shows some abnormalities around the trachea - CT neck with contrast ordered. If CT is neg pt can be discharged as per primary team.  Varney Biles, MD 04/03/16 [id number]    Varney Biles, MD 04/03/16 [phone]

## 2016-04-09 ENCOUNTER — Other Ambulatory Visit: Payer: Self-pay | Admitting: Neurology

## 2016-04-12 ENCOUNTER — Telehealth: Payer: Self-pay

## 2016-04-12 NOTE — Telephone Encounter (Signed)
Patient would like you to send Tramadol to pharmacy today.

## 2016-04-12 NOTE — Telephone Encounter (Signed)
Pt called our office after 5 on Friday. Dr. Carles Collet has written his tramadol in past. I got this request for me to fill at 6:35 pm on friday. All staff out of office. Tramadol is controlled medication. Will you call pt on Monday am and have him come in office for a visit. Or have him call Dr. Carles Collet original/most recent  prescriber. I saw him last in May, 2017. If I am going to prescribe want to talk with him. If needs tramadol regular  basis needs controlled med contract.

## 2016-04-15 ENCOUNTER — Telehealth: Payer: Self-pay | Admitting: Neurology

## 2016-04-15 NOTE — Telephone Encounter (Signed)
Spoke with patient. He was trying to get tramadol RX from our office. Made him aware he was referred to [facility] and it looks like they have taken over his care. He should contact them for prescriptions in the future. One month supply of Tramadol was approved over the weekend for patient.

## 2016-04-15 NOTE — Telephone Encounter (Signed)
Left message on machine for patient to call back.

## 2016-04-15 NOTE — Telephone Encounter (Signed)
PT left a message regarding his prescription and needing a pre authorization/Dawn CB# [id number]

## 2016-04-17 NOTE — Telephone Encounter (Signed)
Per note Dr. Carles Collet provided patient with Tramadol over the weekend.Advised him that his care had been transferred to [facility].

## 2016-04-18 DIAGNOSIS — I10 Essential (primary) hypertension: Secondary | ICD-10-CM | POA: Diagnosis not present

## 2016-04-18 DIAGNOSIS — M5021 Other cervical disc displacement,  high cervical region: Secondary | ICD-10-CM | POA: Diagnosis not present

## 2016-04-18 DIAGNOSIS — M4712 Other spondylosis with myelopathy, cervical region: Secondary | ICD-10-CM | POA: Diagnosis not present

## 2016-04-18 DIAGNOSIS — Z6826 Body mass index (BMI) 26.0-26.9, adult: Secondary | ICD-10-CM | POA: Diagnosis not present

## 2016-05-01 ENCOUNTER — Encounter: Payer: Self-pay | Admitting: Medical

## 2016-05-01 ENCOUNTER — Ambulatory Visit (INDEPENDENT_AMBULATORY_CARE_PROVIDER_SITE_OTHER): Payer: Medicare Other | Admitting: Medical

## 2016-05-01 VITALS — BP 112/68 | HR 71 | Temp 98.2°F | Ht 68.0 in | Wt 178.6 lb

## 2016-05-01 DIAGNOSIS — I6523 Occlusion and stenosis of bilateral carotid arteries: Secondary | ICD-10-CM | POA: Diagnosis not present

## 2016-05-01 DIAGNOSIS — Z23 Encounter for immunization: Secondary | ICD-10-CM | POA: Diagnosis not present

## 2016-05-01 DIAGNOSIS — Z79891 Long term (current) use of opiate analgesic: Secondary | ICD-10-CM | POA: Diagnosis not present

## 2016-05-01 DIAGNOSIS — M25562 Pain in left knee: Secondary | ICD-10-CM

## 2016-05-01 DIAGNOSIS — M25561 Pain in right knee: Secondary | ICD-10-CM | POA: Diagnosis not present

## 2016-05-01 DIAGNOSIS — M792 Neuralgia and neuritis, unspecified: Secondary | ICD-10-CM | POA: Diagnosis not present

## 2016-05-01 DIAGNOSIS — G2581 Restless legs syndrome: Secondary | ICD-10-CM

## 2016-05-01 MED ORDER — TRAMADOL HCL 50 MG PO TABS: 50.0000 mg | ORAL_TABLET | Freq: Two times a day (BID) | ORAL | 0 refills | Status: DC

## 2016-05-01 NOTE — Patient Instructions (Addendum)
For your neuropathic pain, leg pain history and restless leg syndrome, I want you to continue neurontin. You have been on tramadol and will continue regimen. You signed controlled med contract and gave uds today. I will need to discuss with supervising physician if will give tramadol on routing monthly basis.   For your neck finding will need to order further diagnostic studies and likely refer you to vascular surgeon  Follow up in 4 weeks or as needed

## 2016-05-01 NOTE — Progress Notes (Signed)
Subjective:    Patient ID: Brandon Mason, male    DOB: 1926/04/02, 82 y.o.   MRN: [id number]  HPI  Pt in for follow up. Pt had surgery on his neck. Pt had weakness in hands and numbness and tingling of both hands. Also he had leg weakness. Pt states treated for neuropathy in past. But when he had work up by other specialist diagnosed cervical spine bulging. He had ACDF procedure on 03-29-2016. He did well. Symptoms area improving.   Pt also states history of some leg pain and restless leg. He describes neuropathic type pain.  In the past his old neurologist wrote him for gabapentin and tramadol. His old neurologist would not prescribe any more tramadol. Pt states when he got second opinion former neurologist would no longer write any of his meds.  Hx of restless leg syndrome and requip stopped working.  Pt was deyhdrated and weak after surgery on Apr 02, 2016. Then he was rehydrated. But incidental finding of carotid stenosis on imaging studies. Told to follow up here. No neurologic signs or symptoms reported.  Severe suspected bilateral internal carotid stenosis.   Review of Systems  Constitutional: Negative for chills, fatigue and fever.  Respiratory: Negative for cough, chest tightness and wheezing.   Cardiovascular: Negative for chest pain and palpitations.  Gastrointestinal: Negative for abdominal pain, constipation and nausea.  Musculoskeletal: Negative for back pain, neck pain and neck stiffness.       Leg pain bilateral at night. See hpi.  States discomfort without tramadol so severe that he can't sleep and insinuates would not be worth living with such nightly discomfort and unable to sleep.  Skin: Negative for rash.  Neurological: Negative for dizziness, tremors, seizures, syncope, weakness, light-headedness and headaches.       See hpi.  Hematological: Negative for adenopathy. Does not bruise/bleed easily.  Psychiatric/Behavioral: Negative for behavioral problems,  confusion, decreased concentration and suicidal ideas. The patient is not nervous/anxious.    Past Medical History:  Diagnosis Date  . Abnormality of gait 02/09/2013  . Arthritis   . Cancer ([place])    basal cell skin cancer  . Cardiomegaly   . Chronic kidney disease    BPH  . Chronic leg pain   . Hypersomnia   . Hypertension   . Hypoxia    pulmonary  . Neuromuscular disorder (HCC)    restless leg syndrome   . Nocturia   . Prostate disorder   . Restless legs syndrome (RLS) 02/09/2013  . Restless legs syndrome with nocturnal myoclonus   . RLS (restless legs syndrome)    x 40 years  . Shortness of breath    with exertion   . Sleep apnea    uses cpap  . Stroke [facility])    minor stroke in 2003 - no lasting side effects  . UARS (upper airway resistance syndrome) 06/28/2014     Social History   Social History  . Marital status: Married    Spouse name: [name]  . Number of children: 5  . Years of education: 12   Occupational History  . retired      [name] for a [employer]  . Smoking status: Never Smoker  . Smokeless tobacco: Never Used  . Alcohol use 1.2 oz/week    2 Cans of beer per week     Comment: 2 cans of beer per week   . Drug use: No  . Sexual activity: Not on file  Other Topics Concern  . Not on file   Social History Narrative   Patient lives at home with his wife [name].   Patient is retired.    Patient has 5 children.   Patient has a high school education.   Patient is right-handed.   Patient drinks very little caffeine.    Past Surgical History:  Procedure Laterality Date  . ANTERIOR CERVICAL DECOMP/DISCECTOMY FUSION N/A 03/29/2016   Procedure: ANTERIOR CERVICAL DECOMPRESSION/DISCECTOMY FUSION CERVICAL THREE- CERVICAL FOUR;  Surgeon: Ashok Pall, MD;  Location: [facility];  Service: Neurosurgery;  Laterality: N/A;  . BACK SURGERY  2003   L4-5  . CATARACT EXTRACTION    . HERNIA REPAIR     inguinal hernia  .  TRANSURETHRAL PROSTATECTOMY WITH GYRUS INSTRUMENTS  04/28/2012   Procedure: TRANSURETHRAL PROSTATECTOMY WITH GYRUS INSTRUMENTS;  Surgeon: Fredricka Bonine, MD;  Location: [facility];  Service: Urology;  Laterality: N/A;  . TRANSURETHRAL RESECTION OF PROSTATE  03/24/2012   Procedure: TRANSURETHRAL RESECTION OF THE PROSTATE (TURP);  Surgeon: Fredricka Bonine, MD;  Location: [facility];  Service: Urology;  Laterality: N/A;  with gyrus ,Greenlight PVP laser of Prostate    Family History  Problem Relation Age of Onset  . Restless legs syndrome Mother   . Restless legs syndrome Daughter     No Active Allergies  Current Outpatient Prescriptions on File Prior to Visit  Medication Sig Dispense Refill  . albuterol (PROVENTIL HFA;VENTOLIN HFA) 108 (90 Base) MCG/ACT inhaler Inhale 2 puffs into the lungs every 6 (six) hours as needed for wheezing or shortness of breath. 1 Inhaler 0  . aspirin 325 MG tablet Take 325 mg by mouth daily.    . Calcium Carb-Cholecalciferol (CALCIUM 600 + D PO) Take 1 capsule by mouth daily.     . furosemide (LASIX) 40 MG tablet Take 40 mg by mouth daily.     Marland Kitchen gabapentin (NEURONTIN) 600 MG tablet Take 1 tablet (600 mg total) by mouth 3 (three) times daily. 270 tablet 1  . lisinopril (PRINIVIL,ZESTRIL) 20 MG tablet Take 20 mg by mouth daily.     . vitamin B-12 (CYANOCOBALAMIN) 1000 MCG tablet Take 1,000 mcg by mouth daily.     No current facility-administered medications on file prior to visit.     BP 112/68 (BP Location: Left Arm, Patient Position: Sitting)   Pulse 71   Temp 98.2 F (36.8 C) (Oral)   Ht 5\' 8"  (1.727 m)   Wt 178 lb 9.6 oz (81 kg)   SpO2 98%   BMI 27.16 kg/m       Objective:   Physical Exam  General Mental Status- Alert. General Appearance- Not in acute distress.   Skin General: Color- Normal Color. Moisture- Normal Moisture.  Neck Carotid Arteries- Normal color. Moisture- Normal Moisture. No carotid bruits. No JVD.  Chest and Lung  Exam Auscultation: Breath Sounds:-Normal.  Cardiovascular Auscultation:Rythm- Regular. Murmurs & Other Heart Sounds:Auscultation of the heart reveals- No Murmurs.  Abdomen Inspection:-Inspeection Normal. Palpation/Percussion:Note:No mass. Palpation and Percussion of the abdomen reveal- Non Tender, Non Distended + BS, no rebound or guarding.  Neurologic Cranial Nerve exam:- CN III-XII intact(No nystagmus), symmetric smile. Strength:- 5/5 equal and symmetric strength both upper and lower extremities.      Assessment & Plan:  For your neuropathic pain, leg pain history and restless leg syndrome, I want you to continue neurontin. You have been on tramadol and will continue regimen. You signed controlled med contract and gave uds today. I  will need to discuss with supervising physician if will give tramadol on routing monthly basis.   For your neck finding will need to order further diagnostic studies and likely refer you to vascular surgeon(order placed today)  Follow up in 4 weeks or as needed  Riley Hallum, Percell Miller, [employer]

## 2016-05-01 NOTE — Progress Notes (Signed)
Pre visit review using our clinic review tool, if applicable. No additional management support is needed unless otherwise documented below in the visit note. 

## 2016-05-02 ENCOUNTER — Ambulatory Visit (HOSPITAL_BASED_OUTPATIENT_CLINIC_OR_DEPARTMENT_OTHER)
Admission: RE | Admit: 2016-05-02 | Discharge: 2016-05-02 | Disposition: A | Discharge status: Home / Self Care | Payer: Medicare Other | Source: Ambulatory Visit | Attending: Medical | Admitting: Medical

## 2016-05-02 ENCOUNTER — Encounter (HOSPITAL_BASED_OUTPATIENT_CLINIC_OR_DEPARTMENT_OTHER): Payer: Self-pay

## 2016-05-02 ENCOUNTER — Telehealth: Payer: Self-pay | Admitting: Medical

## 2016-05-02 DIAGNOSIS — I771 Stricture of artery: Secondary | ICD-10-CM | POA: Insufficient documentation

## 2016-05-02 DIAGNOSIS — I6523 Occlusion and stenosis of bilateral carotid arteries: Secondary | ICD-10-CM | POA: Diagnosis not present

## 2016-05-02 DIAGNOSIS — R918 Other nonspecific abnormal finding of lung field: Secondary | ICD-10-CM | POA: Diagnosis not present

## 2016-05-02 MED ORDER — IOPAMIDOL (ISOVUE-370) INJECTION 76%
100.0000 mL | Freq: Once | INTRAVENOUS | Status: AC | PRN
  Administered 2016-05-02: 80 mL via INTRAVENOUS

## 2016-05-02 NOTE — Telephone Encounter (Signed)
Referral to vascular surgeon. Please see the referral.

## 2016-05-03 NOTE — Telephone Encounter (Signed)
Requesting results of test from yesterday, CT? Wants to know why he is going to Vascular Surgery? Please advise

## 2016-05-04 ENCOUNTER — Telehealth: Payer: Self-pay | Admitting: Medical

## 2016-05-04 NOTE — Telephone Encounter (Signed)
I called pt today and explained his imaging report and why I am referring to vascular surgery. After conversation he agreed to go and expressed better understanding.

## 2016-05-08 ENCOUNTER — Other Ambulatory Visit: Payer: Self-pay | Admitting: *Deleted

## 2016-05-08 ENCOUNTER — Encounter: Payer: Self-pay | Admitting: Vascular Surgery

## 2016-05-08 DIAGNOSIS — I6523 Occlusion and stenosis of bilateral carotid arteries: Secondary | ICD-10-CM

## 2016-05-09 ENCOUNTER — Encounter: Payer: Self-pay | Admitting: Vascular Surgery

## 2016-05-09 ENCOUNTER — Ambulatory Visit (INDEPENDENT_AMBULATORY_CARE_PROVIDER_SITE_OTHER): Payer: Medicare Other | Admitting: Vascular Surgery

## 2016-05-09 ENCOUNTER — Ambulatory Visit (HOSPITAL_COMMUNITY)
Admission: RE | Admit: 2016-05-09 | Discharge: 2016-05-09 | Disposition: A | Discharge status: Home / Self Care | Payer: Medicare Other | Source: Ambulatory Visit | Attending: Vascular Surgery | Admitting: Vascular Surgery

## 2016-05-09 ENCOUNTER — Telehealth: Payer: Self-pay | Admitting: Medical

## 2016-05-09 VITALS — BP 120/70 | HR 84 | Temp 97.4°F | Resp 18 | Ht 68.0 in | Wt 174.0 lb

## 2016-05-09 DIAGNOSIS — I6523 Occlusion and stenosis of bilateral carotid arteries: Secondary | ICD-10-CM | POA: Diagnosis not present

## 2016-05-09 LAB — VAS US CAROTID
LCCAPDIAS: -10 cm/s
LCCAPSYS: -66 cm/s
LEFT ECA DIAS: -6 cm/s
LICAPSYS: -174 cm/s
Left CCA dist dias: 15 cm/s
Left CCA dist sys: 93 cm/s
Left ICA dist dias: -22 cm/s
Left ICA dist sys: -60 cm/s
Left ICA prox dias: -56 cm/s
RCCADSYS: -55 cm/s
RCCAPSYS: 89 cm/s
RIGHT CCA MID DIAS: 16 cm/s
Right CCA prox dias: 13 cm/s

## 2016-05-09 NOTE — Progress Notes (Signed)
Referring Physician: Elise Benne  Patient name: Brandon Mason MRN: [id number] DOB: 1926-06-04 Sex: male  REASON FOR CONSULT: Asymptomatic bilateral carotid stenosis  HPI: Brandon Mason is a 82 y.o. male, and for evaluation of bilateral internal carotid artery stenosis found on CT scan done for evaluation of a neck mass. The patient did have a stroke 2 years ago. This manifested with symptoms of dizziness left arm weakness slurred speech but all symptoms eventually completely resolved. He is on aspirin daily. He denies any symptoms of TIA amaurosis or stroke since that event 2 years ago. CT scan of the neck otherwise showed no neck mass. Other medical problems include chronic leg pain with restless legs, hypertension, chronic dyspnea on exertion and at rest.  Past Medical History:  Diagnosis Date  . Abnormality of gait 02/09/2013  . Arthritis   . Cancer ([place])    basal cell skin cancer  . Cardiomegaly   . Chronic kidney disease    BPH  . Chronic leg pain   . Hypersomnia   . Hypertension   . Hypoxia    pulmonary  . Neuromuscular disorder (HCC)    restless leg syndrome   . Nocturia   . Prostate disorder   . Restless legs syndrome (RLS) 02/09/2013  . Restless legs syndrome with nocturnal myoclonus   . RLS (restless legs syndrome)    x 40 years  . Shortness of breath    with exertion   . Sleep apnea    uses cpap  . Stroke [facility])    minor stroke in 2003 - no lasting side effects  . UARS (upper airway resistance syndrome) 06/28/2014   Past Surgical History:  Procedure Laterality Date  . ANTERIOR CERVICAL DECOMP/DISCECTOMY FUSION N/A 03/29/2016   Procedure: ANTERIOR CERVICAL DECOMPRESSION/DISCECTOMY FUSION CERVICAL THREE- CERVICAL FOUR;  Surgeon: Ashok Pall, MD;  Location: [facility];  Service: Neurosurgery;  Laterality: N/A;  . BACK SURGERY  2003   L4-5  . CATARACT EXTRACTION    . HERNIA REPAIR     inguinal hernia  . TRANSURETHRAL PROSTATECTOMY WITH GYRUS  INSTRUMENTS  04/28/2012   Procedure: TRANSURETHRAL PROSTATECTOMY WITH GYRUS INSTRUMENTS;  Surgeon: Fredricka Bonine, MD;  Location: [facility];  Service: Urology;  Laterality: N/A;  . TRANSURETHRAL RESECTION OF PROSTATE  03/24/2012   Procedure: TRANSURETHRAL RESECTION OF THE PROSTATE (TURP);  Surgeon: Fredricka Bonine, MD;  Location: [facility];  Service: Urology;  Laterality: N/A;  with gyrus ,Greenlight PVP laser of Prostate    Family History  Problem Relation Age of Onset  . Restless legs syndrome Mother   . Restless legs syndrome Daughter     SOCIAL HISTORY: Social History   Social History  . Marital status: Married    Spouse name: [name]  . Number of children: 5  . Years of education: 12   Occupational History  . retired      [name] for a [employer]  . Smoking status: Never Smoker  . Smokeless tobacco: Never Used  . Alcohol use 1.2 oz/week    2 Cans of beer per week     Comment: 2 cans of beer per week   . Drug use: No  . Sexual activity: Not on file   Other Topics Concern  . Not on file   Social History Narrative   Patient lives at home with his wife [name].   Patient is retired.    Patient has 5 children.   Patient  has a high school education.   Patient is right-handed.   Patient drinks very little caffeine.    No Active Allergies  Current Outpatient Prescriptions  Medication Sig Dispense Refill  . aspirin 325 MG tablet Take 325 mg by mouth daily.    . Calcium Carb-Cholecalciferol (CALCIUM 600 + D PO) Take 1 capsule by mouth daily.     . furosemide (LASIX) 40 MG tablet Take 40 mg by mouth daily.     Marland Kitchen gabapentin (NEURONTIN) 600 MG tablet Take 1 tablet (600 mg total) by mouth 3 (three) times daily. 270 tablet 1  . lisinopril (PRINIVIL,ZESTRIL) 20 MG tablet Take 20 mg by mouth daily.     . traMADol (ULTRAM) 50 MG tablet Take 1 tablet (50 mg total) by mouth 2 (two) times daily. 60 tablet 0  . vitamin B-12 (CYANOCOBALAMIN)  1000 MCG tablet Take 1,000 mcg by mouth daily.     No current facility-administered medications for this visit.     ROS:   General:  No weight loss, Fever, chills  HEENT: No recent headaches, no nasal bleeding, no visual changes, no sore throat  Neurologic: No dizziness, blackouts, seizures. No recent symptoms of stroke or mini- stroke. No recent episodes of slurred speech, or temporary blindness.  Cardiac: No recent episodes of chest pain/pressure, + shortness of breath at rest.  + shortness of breath with exertion.  Denies history of atrial fibrillation or irregular heartbeat  Vascular: No history of rest pain in feet.  No history of claudication.  No history of non-healing ulcer, No history of DVT   Pulmonary: No home oxygen, no productive cough, no hemoptysis,  No asthma or wheezing  Musculoskeletal:  [ ]  Arthritis, [ ]  Low back pain,  [ ]  Joint pain  Hematologic:No history of hypercoagulable state.  No history of easy bleeding.  No history of anemia  Gastrointestinal: No hematochezia or melena,  No gastroesophageal reflux, no trouble swallowing  Urinary: [X]  chronic Kidney disease, [ ]  on HD - [ ]  MWF or [ ]  TTHS, [ ]  Burning with urination, [ ]  Frequent urination, [ ]  Difficulty urinating;   Skin: No rashes  Psychological: No history of anxiety,  + history of depression   Physical Examination  Vitals:   05/09/16 1204 05/09/16 1206  BP: 123/75 120/70  Pulse: 88 84  Resp:  18  Temp:  97.4 F (36.3 C)  TempSrc:  Oral  SpO2:  93%  Weight:  174 lb (78.9 kg)  Height:  5\' 8"  (1.727 m)    Body mass index is 26.46 kg/m.  General:  Alert and oriented, no acute distress HEENT: Normal Neck: No bruit or JVD Pulmonary: Clear to auscultation bilaterally Cardiac: Regular Rate and Rhythm without murmur Abdomen: Soft, non-tender, non-distended, no mass Skin: No rash Extremity Pulses:  2+ radial, brachial, femoral, dorsalis pedis, posterior tibial pulses  bilaterally Musculoskeletal: No deformity or edema  Neurologic: Upper and lower extremity motor 5/5 and symmetric  DATA:  Patient had bilateral carotid duplex exam today which showed 40-60% stenosis bilaterally. Subclavian arteries were multiphasic bilaterally. Vertebral artery flow is antegrade.  I reviewed the patient's recent CT Angio images. This showed some calcification at the origin of the right subclavian but I did not feel this was significantly narrowed. There is calcification of the carotid bifurcation bilaterally again consistent with moderate stenosis suggested on our ultrasound today.  ASSESSMENT:  Patient with bilateral moderate carotid stenoses currently asymptomatic. Risk of this for stroke is less than 5%  per year. We'll continue aspirin. Consideration should be given for starting a statin although in light of the patient's age this may not be of significant benefit. We will leave this at the discretion of his primary care provider.  Patient also has some evidence of right subclavian stenosis by CT scan but his arm pressures are equal bilaterally with equal pulses side down this is clinically significant.   PLAN:  Continue aspirin. Consideration for statin per primary care. Follow-up 1 year with our nurse practitioner. carotid duplex scan. Patient will call us and schedule pointed sooner if he develops any symptoms of TIA amaurosis or stroke.  Would consider carotid endarterectomy if patient develops symptoms or stenosis progresses to greater than 80%.   Ruta Hinds, MD [facility] Office: [phone] Pager: [phone]

## 2016-05-09 NOTE — Telephone Encounter (Signed)
Pt had a vascular surgeon appointment for his carotid stenosis. Less than 5% per year chance of stroke. Vascular surgeon mentioned possibility of prescribing statin by pcp.  So I want to offer pt to get lipid panel fasting. After reviewing those results can discuss with him benefit vs risk of statin in light of his age. Will put future lipid panel test in

## 2016-05-10 NOTE — Telephone Encounter (Signed)
I left a message that the patient would need to schedule a fasting lab appointment within the next month. Pt was asked to call back with any questions.

## 2016-06-06 ENCOUNTER — Other Ambulatory Visit: Payer: Self-pay | Admitting: Medical

## 2016-06-06 MED ORDER — TRAMADOL HCL 50 MG PO TABS
50.0000 mg | ORAL_TABLET | Freq: Two times a day (BID) | ORAL | 0 refills | Status: DC
Start: 2016-06-06 — End: 2016-07-04

## 2016-06-06 NOTE — Telephone Encounter (Signed)
Relation to [id number] Call back number:[phone]   Reason for call:  Patient requesting a refill traMADol (ULTRAM) 50 MG tablet

## 2016-06-06 NOTE — Telephone Encounter (Signed)
Please advise.   Last refill was 05/01/16.  05/01/16 controlled substance contract signed, uds sample given, low risk next screen 10/29/16.

## 2016-06-06 NOTE — Telephone Encounter (Signed)
rx tramadol printed. He can pick up rx. Tomorrow. If you pull script off of printer and I will sign the rx. Then notify pt he can pick up rx.

## 2016-06-07 NOTE — Telephone Encounter (Signed)
Rx sent to pharmacy   

## 2016-06-10 NOTE — Telephone Encounter (Addendum)
Called [facility] in [place]. They received rx, but they stated they cannot fill it until tomorrow (06/11/16) because it is too soon to fill. Pt can pick up rx tomorrow.

## 2016-06-10 NOTE — Telephone Encounter (Addendum)
Patient lvm 06/10/16 at 9:09am checking on the status of medication request appears pharmacy never received best # [phone]

## 2016-06-10 NOTE — Telephone Encounter (Signed)
Patient would like to speak with nurse, please advise

## 2016-06-10 NOTE — Telephone Encounter (Signed)
Called pt, he states he had already been called back and had no further needs at the time of my call.

## 2016-07-02 ENCOUNTER — Telehealth: Payer: Self-pay | Admitting: Medical

## 2016-07-02 NOTE — Telephone Encounter (Signed)
Refill request for Gabapentin 600 mg. Gabapentin has never been filled by our office [Primary Care]; patient receives from Neurology office Please Advise/SLS 01/02   Refill request for Tramadol 50 mg Last filled by MD on - 06/06/16, #[id number] Last AEX - 05/01/16 AVS Instructions:  For your neuropathic pain, leg pain history and restless leg syndrome, I want you to continue neurontin. You have been on tramadol and will continue regimen. You signed controlled med contract and gave uds today. I will need to discuss with supervising physician if will give tramadol on routing monthly basis.  For your neck finding will need to order further diagnostic studies and likely refer you to vascular surgeon. Follow up in 4 weeks or as needed. Please Advise on refills/SLS 01/02

## 2016-07-02 NOTE — Telephone Encounter (Signed)
Caller name: Jamad Relation to pt: self Call back number: [phone] Pharmacy: [place], [place] - [address]  Reason for call: Pt came in office stating needing refill on gabapentin (NEURONTIN) 600 MG tablet and traMADol (ULTRAM) 50 MG tablet. Please advise pt when rx sent to pharmacy ASAP. (pt has difficulty hearing on the phone, please speak loud to notify pt).

## 2016-07-04 ENCOUNTER — Telehealth: Payer: Self-pay

## 2016-07-04 MED ORDER — GABAPENTIN 600 MG PO TABS: 600.0000 mg | ORAL_TABLET | Freq: Three times a day (TID) | ORAL | 1 refills | Status: DC

## 2016-07-04 MED ORDER — TRAMADOL HCL 50 MG PO TABS: 50.0000 mg | ORAL_TABLET | Freq: Two times a day (BID) | ORAL | 0 refills | Status: DC

## 2016-07-04 NOTE — Telephone Encounter (Signed)
Faxed Rx for Tramadol  to [facility] per patient request. Advised Gabapentin went electronically.

## 2016-07-04 NOTE — Telephone Encounter (Signed)
I filled pt gabapentin and tramadol today. On rx script tramadol stated he can fill the rx on Jul 06, 2016.

## 2016-07-05 NOTE — Telephone Encounter (Signed)
Called and spoke with the pt and informed him that the medication refill request has been taking care of.  Informed him that the Tramadol can not be filled until (07/07/15).  Pt verbalized understanding and stated that he will give the pharmacy a call.//AB/CMA

## 2016-07-16 ENCOUNTER — Ambulatory Visit (INDEPENDENT_AMBULATORY_CARE_PROVIDER_SITE_OTHER): Payer: Medicare Other | Admitting: Medical

## 2016-07-16 ENCOUNTER — Ambulatory Visit (HOSPITAL_BASED_OUTPATIENT_CLINIC_OR_DEPARTMENT_OTHER)
Admission: RE | Admit: 2016-07-16 | Discharge: 2016-07-16 | Disposition: A | Discharge status: Home / Self Care | Payer: Medicare Other | Source: Ambulatory Visit | Attending: Medical | Admitting: Medical

## 2016-07-16 ENCOUNTER — Encounter: Payer: Self-pay | Admitting: Medical

## 2016-07-16 VITALS — BP 128/78 | HR 78 | Temp 98.2°F | Resp 16 | Ht 68.0 in | Wt 177.2 lb

## 2016-07-16 DIAGNOSIS — R143 Flatulence: Secondary | ICD-10-CM | POA: Diagnosis not present

## 2016-07-16 DIAGNOSIS — R109 Unspecified abdominal pain: Secondary | ICD-10-CM | POA: Insufficient documentation

## 2016-07-16 MED ORDER — SERTRALINE HCL 25 MG PO TABS: 25.0000 mg | ORAL_TABLET | Freq: Every day | ORAL | 0 refills | Status: DC

## 2016-07-16 NOTE — Addendum Note (Signed)
Addended by: Anabel Halon on: 07/16/2016 06:48 PM   Modules accepted: Orders

## 2016-07-16 NOTE — Progress Notes (Signed)
Pre visit review using our clinic review tool, if applicable. No additional management support is needed unless otherwise documented below in the visit note/SLS  

## 2016-07-16 NOTE — Patient Instructions (Addendum)
For your abdomen bloating will get xray of your abdomen to see gas pattern and amount of stool.   Eat very healthy diet. Avoid greasy foods.  Can use phazyme or gas-x otc.  Willl notify you on your abd xray results. May change treatment after studies back.  Follow up 2 weeks or as needed  Will call you on xray results and discuss options on depression.(which he mentioned literally at he very end of exam. Plan to discuss with him use of low dose sertraline. But he is on tramadol. So gave below). Did provide education on serotonin syndrome. After hours discussed his mood further. States just depressed for one month. Not anxious. Rx low dose sertraline. Advised pt will send to his pharmacy.    After hours xray showed large stool burden on xray of abdomen. Called pt and advised use mag citrate otc tonight and see if this clears his stool and decrease his mild abdominal bloating.    Serotonin Syndrome Serotonin is a brain chemical that regulates the nervous system, which includes the brain, spinal cord, and nerves. Serotonin appears to play a role in all types of behavior, including appetite, emotions, movement, thinking, and response to stress. Excessively high levels of serotonin in the body can cause serotonin syndrome, which is a very dangerous condition. What are the causes? This condition can be caused by taking medicines or drugs that increase the level of serotonin in your body. These include:  Antidepressant medicines.  Migraine medicines.  Certain pain medicines.  Certain recreational drugs, including ecstasy, LSD, cocaine, and amphetamines.  Over-the-counter cough or cold medicines that contain dextromethorphan.  Certain herbal supplements, including St. John's wort, ginseng, and nutmeg. This condition usually occurs when you take these medicines or drugs in combination, but it can also happen with a high dose of a single medicine or drug. What increases the risk? This  condition is more likely to develop in:  People who have recently increased the dosage of medicine that increases the serotonin level.  People who just started taking medicine that increases the serotonin level. What are the signs or symptoms? Symptoms of this condition usually happens within several hours of a medicine change. Symptoms include:  Headache.  Muscle twitching or stiffness.  Diarrhea.  Confusion.  Restlessness or agitation.  Shivering or goose bumps.  Loss of muscle coordination.  Rapid heart rate.  Sweating. Severe cases of serotonin syndromecan cause:  Irregular heartbeat.  Seizures.  Loss of consciousness.  High fever. How is this diagnosed? This condition is diagnosed with a medical history and physical exam. You will be asked aboutyour symptoms and your use of medicines and recreational drugs. Your health care provider may also order lab work or additional tests to rule out other causes of your symptoms. How is this treated? The treatment for this condition depends on the severity of your symptoms. For mild cases, stopping the medicine that caused your condition is usually all that is needed. For moderate to severe cases, hospitalization is required to monitor you and to prevent further muscle damage. Follow these instructions at home:  Take over-the-counter and prescription medicines only as told by your health care provider. This is important.  Check with your health care provider before you start taking any new prescriptions, over-the-counter medicines, herbs, or supplements.  Avoid combining any medicines that can cause this condition to occur.  Keep all follow-up visits as told by your health care provider.This is important.  Maintain a healthy lifestyle.  Eat healthy  foods.  Get plenty of sleep.  Exercise regularly.  Do not drink alcohol.  Do not use recreational drugs. Contact a health care provider if:  Medicines do not seem  to be helping.  Your symptoms do not improve or they get worse.  You have trouble taking care of yourself. Get help right away if:  You have worsening confusion, severe headache, chest pain, high fever, seizures, or loss of consciousness.  You have serious thoughts about hurting yourself or others.  You experience serious side effects of medicine, such as swelling of your face, lips, tongue, or throat. This information is not intended to replace advice given to you by your health care provider. Make sure you discuss any questions you have with your health care provider. Document Released: 07/25/2004 Document Revised: 02/10/2016 Document Reviewed: 06/30/2014 Elsevier Interactive Patient Education  2017 [employer].

## 2016-07-16 NOTE — Progress Notes (Signed)
Subjective:    Patient ID: Brandon Mason, male    DOB: 08-Feb-1926, 83 y.o.   MRN: [id number]  HPI  Pt feeling bloated and passing gas a lot. He states he is sometimes not aware of this. He denies constipation. He states last bowel was yesterday. He states yesterday was soft. Flatulence for one month.  No abdomen pain.  Wife tells him he is passing gas.  Pt states he tries to eat vegetables alot. He does not eat out a lot. But admits not real strict diet.   Pt still taking tramadol. He is not getting constipated per his report.  Pt not taking anything over the counter.      Review of Systems  Constitutional: Negative for chills, fatigue and fever.  Respiratory: Negative for cough, chest tightness, shortness of breath and wheezing.   Cardiovascular: Negative for chest pain and palpitations.  Gastrointestinal: Positive for abdominal distention and abdominal pain.  Musculoskeletal: Negative for back pain, neck pain and neck stiffness.  Skin: Negative for rash.  Neurological: Negative for dizziness and headaches.  Hematological: Negative for adenopathy. Does not bruise/bleed easily.  Psychiatric/Behavioral: Positive for dysphoric mood. Negative for behavioral problems, confusion and suicidal ideas. The patient is not nervous/anxious.     Past Medical History:  Diagnosis Date  . Abnormality of gait 02/09/2013  . Arthritis   . Cancer ([place])    basal cell skin cancer  . Cardiomegaly   . Chronic kidney disease    BPH  . Chronic leg pain   . Hypersomnia   . Hypertension   . Hypoxia    pulmonary  . Neuromuscular disorder (HCC)    restless leg syndrome   . Nocturia   . Prostate disorder   . Restless legs syndrome (RLS) 02/09/2013  . Restless legs syndrome with nocturnal myoclonus   . RLS (restless legs syndrome)    x 40 years  . Shortness of breath    with exertion   . Sleep apnea    uses cpap  . Stroke [facility])    minor stroke in 2003 - no lasting side effects  . UARS  (upper airway resistance syndrome) 06/28/2014     Social History   Social History  . Marital status: Married    Spouse name: [name]  . Number of children: 5  . Years of education: 12   Occupational History  . retired      [name] for a [employer]  . Smoking status: Never Smoker  . Smokeless tobacco: Never Used  . Alcohol use 1.2 oz/week    2 Cans of beer per week     Comment: 2 cans of beer per week   . Drug use: No  . Sexual activity: Not on file   Other Topics Concern  . Not on file   Social History Narrative   Patient lives at home with his wife [name].   Patient is retired.    Patient has 5 children.   Patient has a high school education.   Patient is right-handed.   Patient drinks very little caffeine.    Past Surgical History:  Procedure Laterality Date  . ANTERIOR CERVICAL DECOMP/DISCECTOMY FUSION N/A 03/29/2016   Procedure: ANTERIOR CERVICAL DECOMPRESSION/DISCECTOMY FUSION CERVICAL THREE- CERVICAL FOUR;  Surgeon: Ashok Pall, MD;  Location: [place] [facility];  Service: Neurosurgery;  Laterality: N/A;  . BACK SURGERY  2003   L4-5  . CATARACT EXTRACTION    . HERNIA REPAIR  inguinal hernia  . TRANSURETHRAL PROSTATECTOMY WITH GYRUS INSTRUMENTS  04/28/2012   Procedure: TRANSURETHRAL PROSTATECTOMY WITH GYRUS INSTRUMENTS;  Surgeon: Fredricka Bonine, MD;  Location: [facility];  Service: Urology;  Laterality: N/A;  . TRANSURETHRAL RESECTION OF PROSTATE  03/24/2012   Procedure: TRANSURETHRAL RESECTION OF THE PROSTATE (TURP);  Surgeon: Fredricka Bonine, MD;  Location: [facility];  Service: Urology;  Laterality: N/A;  with gyrus ,Greenlight PVP laser of Prostate    Family History  Problem Relation Age of Onset  . Restless legs syndrome Mother   . Restless legs syndrome Daughter     No Known Allergies  Current Outpatient Prescriptions on File Prior to Visit  Medication Sig Dispense Refill  . aspirin 325 MG tablet Take 325 mg by  mouth daily.    . Calcium Carb-Cholecalciferol (CALCIUM 600 + D PO) Take 1 capsule by mouth daily.     . furosemide (LASIX) 40 MG tablet Take 40 mg by mouth daily.     Marland Kitchen gabapentin (NEURONTIN) 600 MG tablet Take 1 tablet (600 mg total) by mouth 3 (three) times daily. 270 tablet 1  . lisinopril (PRINIVIL,ZESTRIL) 20 MG tablet Take 20 mg by mouth daily.     . traMADol (ULTRAM) 50 MG tablet Take 1 tablet (50 mg total) by mouth 2 (two) times daily. 60 tablet 0  . vitamin B-12 (CYANOCOBALAMIN) 1000 MCG tablet Take 1,000 mcg by mouth daily.     No current facility-administered medications on file prior to visit.     BP 128/78 (BP Location: Left Arm, Patient Position: Sitting, Cuff Size: Normal)   Pulse 78   Temp 98.2 F (36.8 C) (Oral)   Resp 16   Ht 5\' 8"  (1.727 m)   Wt 177 lb 4 oz (80.4 kg)   SpO2 95%   BMI 26.95 kg/m       Objective:   Physical Exam  General Appearance- Not in acute distress.  HEENT Eyes- Scleraeral/Conjuntiva-bilat- Not Yellow. Mouth & Throat- Normal.  Chest and Lung Exam Auscultation: Breath sounds:-Normal. Adventitious sounds:- No Adventitious sounds.  Cardiovascular Auscultation:Rythm - Regular. Heart Sounds -Normal heart sounds.  Abdomen Inspection:-Inspection Normal.  Palpation/Perucssion: Palpation and Percussion of the abdomen reveal- Non Tender, No Rebound tenderness, No rigidity(Guarding) and No Palpable abdominal masses.  Liver:-Normal.  Spleen:- Normal.         Assessment & Plan:  For your abdomen bloating will get xray of your abdomen to see gas pattern and amount of stool.   Eat very healthy diet. Avoid greasy foods.  Can use phazyme or gas-x otc.  Willl notify you on your abd xray results. May change treatment after studies back.  Follow up 2 weeks or as needed  Will call you on xray results and discuss options on depression.(which he mentioned literally at he very end of exam. Plan to discuss with him use of low dose  sertraline. But he is on tramadol. So gave below). Did provide education on serotonin syndrome  Vianca Bracher, [facility], [place]

## 2016-07-23 ENCOUNTER — Encounter: Payer: Self-pay | Admitting: Internal Medicine

## 2016-07-23 ENCOUNTER — Ambulatory Visit (HOSPITAL_BASED_OUTPATIENT_CLINIC_OR_DEPARTMENT_OTHER)
Admission: RE | Admit: 2016-07-23 | Discharge: 2016-07-23 | Disposition: A | Discharge status: Home / Self Care | Payer: Medicare Other | Source: Ambulatory Visit | Attending: Internal Medicine | Admitting: Internal Medicine

## 2016-07-23 ENCOUNTER — Ambulatory Visit (INDEPENDENT_AMBULATORY_CARE_PROVIDER_SITE_OTHER): Payer: Medicare Other | Admitting: Internal Medicine

## 2016-07-23 VITALS — BP 128/80 | HR 79 | Temp 97.9°F | Resp 14 | Ht 68.0 in | Wt 181.1 lb

## 2016-07-23 DIAGNOSIS — Z981 Arthrodesis status: Secondary | ICD-10-CM | POA: Diagnosis not present

## 2016-07-23 DIAGNOSIS — M542 Cervicalgia: Secondary | ICD-10-CM | POA: Insufficient documentation

## 2016-07-23 DIAGNOSIS — I6523 Occlusion and stenosis of bilateral carotid arteries: Secondary | ICD-10-CM | POA: Insufficient documentation

## 2016-07-23 DIAGNOSIS — M47812 Spondylosis without myelopathy or radiculopathy, cervical region: Secondary | ICD-10-CM | POA: Insufficient documentation

## 2016-07-23 DIAGNOSIS — R319 Hematuria, unspecified: Secondary | ICD-10-CM

## 2016-07-23 DIAGNOSIS — M8580 Other specified disorders of bone density and structure, unspecified site: Secondary | ICD-10-CM | POA: Diagnosis not present

## 2016-07-23 MED ORDER — PREDNISONE 10 MG PO TABS: ORAL_TABLET | ORAL | 0 refills | Status: DC

## 2016-07-23 NOTE — Progress Notes (Signed)
Pre visit review using our clinic review tool, if applicable. No additional management support is needed unless otherwise documented below in the visit note. 

## 2016-07-23 NOTE — Patient Instructions (Addendum)
For neck pain: Please go downstairs and get a x-ray Prednisone as prescribed Avoid taking Aleve, Motrin, naproxen or any similar medications Tylenol  500 mg OTC 2 tabs a day every 8 hours as needed for pain Take Ultram If that is not working, please let us know. We are sending you back to Dr. Cyndy Freeze  Also, I'm sending you to see the urologist regards the blood in the urine.  Please come back and see [name] in 2-3 weeks from today. Call anytime if pain is severe

## 2016-07-23 NOTE — Progress Notes (Signed)
Subjective:    Patient ID: Brandon Mason, male    DOB: April 05, 1926, 83 y.o.   MRN: [id number]  DOS:  07/23/2016 Type of visit - description : acute, PCP Percell Miller Interval history: Symptoms started 4 days ago: Steady, throbbing pain at the right side of the neck, radiating upwards. No radiation to the shoulder or arms. Pain is moderate in the daytime, at night sometimes is 10 / 10. He has tried ice and Aleve with no relief. Pain increases when he move/turn his head. He has a history of neck surgery 4 months ago after what he become essentially asymptomatic. Prior to the onset of the pain 4 days ago he shoveled snow.  Also, 3 days ago he urinated 2 blood clots, they were soft, one was like a string,  not associated with other symptoms.  Review of Systems  Fever chills No rash No headache per se No  diplopia. No new paresthesias, he does have some numbness in the hands and feet. No difficulty with his gait. No dysuria, difficulty urinating  No flank pains.  Past Medical History:  Diagnosis Date  . Abnormality of gait 02/09/2013  . Arthritis   . Cancer ([place])    basal cell skin cancer  . Cardiomegaly   . Chronic kidney disease    BPH  . Chronic leg pain   . Hypersomnia   . Hypertension   . Hypoxia    pulmonary  . Neuromuscular disorder (HCC)    restless leg syndrome   . Nocturia   . Prostate disorder   . Restless legs syndrome (RLS) 02/09/2013  . Restless legs syndrome with nocturnal myoclonus   . RLS (restless legs syndrome)    x 40 years  . Shortness of breath    with exertion   . Sleep apnea    uses cpap  . Stroke [facility])    minor stroke in 2003 - no lasting side effects  . UARS (upper airway resistance syndrome) 06/28/2014    Past Surgical History:  Procedure Laterality Date  . ANTERIOR CERVICAL DECOMP/DISCECTOMY FUSION N/A 03/29/2016   Procedure: ANTERIOR CERVICAL DECOMPRESSION/DISCECTOMY FUSION CERVICAL THREE- CERVICAL FOUR;  Surgeon: Ashok Pall, MD;  Location:  [place] [facility];  Service: Neurosurgery;  Laterality: N/A;  . BACK SURGERY  2003   L4-5  . CATARACT EXTRACTION    . HERNIA REPAIR     inguinal hernia  . TRANSURETHRAL PROSTATECTOMY WITH GYRUS INSTRUMENTS  04/28/2012   Procedure: TRANSURETHRAL PROSTATECTOMY WITH GYRUS INSTRUMENTS;  Surgeon: Fredricka Bonine, MD;  Location: [facility];  Service: Urology;  Laterality: N/A;  . TRANSURETHRAL RESECTION OF PROSTATE  03/24/2012   Procedure: TRANSURETHRAL RESECTION OF THE PROSTATE (TURP);  Surgeon: Fredricka Bonine, MD;  Location: [facility];  Service: Urology;  Laterality: N/A;  with gyrus ,Greenlight PVP laser of Prostate    Social History   Social History  . Marital status: Married    Spouse name: [name]  . Number of children: 5  . Years of education: 12   Occupational History  . retired      [name] for a [employer]  . Smoking status: Never Smoker  . Smokeless tobacco: Never Used  . Alcohol use 1.2 oz/week    2 Cans of beer per week     Comment: 2 cans of beer per week   . Drug use: No  . Sexual activity: Not on file   Other Topics Concern  . Not on  file   Social History Narrative   Patient lives at home with his wife [name].   Patient is retired.    Patient has 5 children.   Patient has a high school education.   Patient is right-handed.   Patient drinks very little caffeine.      Allergies as of 07/23/2016   No Known Allergies     Medication List       Accurate as of 07/23/16  7:14 PM. Always use your most recent med list.          aspirin 325 MG tablet Take 325 mg by mouth daily.   CALCIUM 600 + D PO Take 1 capsule by mouth daily.   furosemide 40 MG tablet Commonly known as:  LASIX Take 40 mg by mouth daily.   gabapentin 600 MG tablet Commonly known as:  NEURONTIN Take 1 tablet (600 mg total) by mouth 3 (three) times daily.   lisinopril 20 MG tablet Commonly known as:  PRINIVIL,ZESTRIL Take 20 mg by mouth daily.     predniSONE 10 MG tablet Commonly known as:  DELTASONE 4 tablets x 2 days, 3 tabs x 2 days, 2 tabs x 2 days, 1 tab x 2 days   sertraline 25 MG tablet Commonly known as:  ZOLOFT Take 1 tablet (25 mg total) by mouth daily.   traMADol 50 MG tablet Commonly known as:  ULTRAM Take 1 tablet (50 mg total) by mouth 2 (two) times daily.   vitamin B-12 1000 MCG tablet Commonly known as:  CYANOCOBALAMIN Take 1,000 mcg by mouth daily.          Objective:   Physical Exam  HENT:  Head:     BP 128/80 (BP Location: Left Arm, Patient Position: Sitting, Cuff Size: Normal)   Pulse 79   Temp 97.9 F (36.6 C) (Oral)   Resp 14   Ht 5\' 8"  (1.727 m)   Wt 181 lb 2 oz (82.2 kg)   SpO2 95%   BMI 27.54 kg/m   General:   Well developed, well nourished . NAD.  Neck: No TTP at the cervical spine, range of motion is slightly limited. HEENT:  Normocephalic . Face symmetric, atraumatic Lungs:  CTA B Normal respiratory effort, no intercostal retractions, no accessory muscle use. Heart: RRR,  no murmur.  No pretibial edema bilaterally  Abdomen:  Not distended, soft, non-tender. No rebound or rigidity.  No CVA tenderness Skin: Exposed areas without rash. Not pale. Not jaundice Neurologic:  alert & oriented X3.  Speech normal, gait appropriate for age and unassisted Strength symmetric and appropriate for age.  Motor symmetric, DTRs symmetric except for a decreased/absent right ankle jerk Psych: Cognition and judgment appear intact.  Cooperative with normal attention span and concentration.  Behavior appropriate. No anxious or depressed appearing.    Assessment & Plan:    83 year old gentleman with history of BPH that is post surgery 2013, bcc skin cancer, HTN, recent neck surgery with good results presents with the following:  Right-sided neck pain: Sounds mechanical in nature, will recommend a round of prednisone, Tylenol, avoid NSAIDs, cont tramadol, will refer back to neurosurgery.  The x-ray Painless hematuria: UA, urine culture, refer to urology Depression: Recently started on Zoloft by PCP. RTC 2 - 3 weeks to see PCP Patient somewhat reluctant to see specialists, if he is unable/unwilling  to pursue those referrals recommend to discuss with PCP. Will send a message to PCP notifying him of this visit.

## 2016-07-24 ENCOUNTER — Emergency Department (HOSPITAL_BASED_OUTPATIENT_CLINIC_OR_DEPARTMENT_OTHER)
Admission: EM | Admit: 2016-07-24 | Discharge: 2016-07-25 | Disposition: A | Discharge status: Home / Self Care | Payer: Medicare Other | Attending: Emergency Medicine | Admitting: Emergency Medicine

## 2016-07-24 ENCOUNTER — Encounter (HOSPITAL_BASED_OUTPATIENT_CLINIC_OR_DEPARTMENT_OTHER): Payer: Self-pay

## 2016-07-24 DIAGNOSIS — M5481 Occipital neuralgia: Secondary | ICD-10-CM

## 2016-07-24 DIAGNOSIS — Z79899 Other long term (current) drug therapy: Secondary | ICD-10-CM | POA: Diagnosis not present

## 2016-07-24 DIAGNOSIS — Z85828 Personal history of other malignant neoplasm of skin: Secondary | ICD-10-CM | POA: Diagnosis not present

## 2016-07-24 DIAGNOSIS — I129 Hypertensive chronic kidney disease with stage 1 through stage 4 chronic kidney disease, or unspecified chronic kidney disease: Secondary | ICD-10-CM | POA: Insufficient documentation

## 2016-07-24 DIAGNOSIS — M542 Cervicalgia: Secondary | ICD-10-CM | POA: Diagnosis present

## 2016-07-24 DIAGNOSIS — N189 Chronic kidney disease, unspecified: Secondary | ICD-10-CM | POA: Insufficient documentation

## 2016-07-24 DIAGNOSIS — Z7982 Long term (current) use of aspirin: Secondary | ICD-10-CM | POA: Insufficient documentation

## 2016-07-24 LAB — URINALYSIS, ROUTINE W REFLEX MICROSCOPIC
BILIRUBIN URINE: NEGATIVE
Hgb urine dipstick: NEGATIVE
KETONES UR: NEGATIVE
LEUKOCYTES UA: NEGATIVE
Nitrite: NEGATIVE
PH: 6.5 (ref 5.0–8.0)
RBC / HPF: NONE SEEN (ref 0–?)
SPECIFIC GRAVITY, URINE: 1.015 (ref 1.000–1.030)
TOTAL PROTEIN, URINE-UPE24: NEGATIVE
UROBILINOGEN UA: 0.2 (ref 0.0–1.0)
Urine Glucose: NEGATIVE

## 2016-07-24 LAB — URINE CULTURE: Organism ID, Bacteria: NO GROWTH

## 2016-07-24 NOTE — ED Notes (Signed)
Pt's daughter requested/received ice pack for pt's neck

## 2016-07-24 NOTE — ED Notes (Signed)
Pt presents with right sided neck pain that radiates to frontal aspect of head. States onset was this past Friday. Stated that pain had immediate onset. States he went to primary MD, was given prednisone and x-ray was performed.

## 2016-07-24 NOTE — ED Triage Notes (Signed)
C/o posterior neck pain that come up and across to forehead-denies injury-seen by PCP yesterday-has appt with neurosurgeon next week-cervical spine surgery approx 6 mos ago-NAD-steady gait

## 2016-07-24 NOTE — ED Notes (Signed)
Pt was seen yesterday by PMD for same and has tramadol at home for pain.

## 2016-07-24 NOTE — ED Notes (Signed)
Pt states has an appointment with neuro-surgeon next tuesday

## 2016-07-25 DIAGNOSIS — M5481 Occipital neuralgia: Secondary | ICD-10-CM | POA: Diagnosis not present

## 2016-07-25 MED ORDER — HYDROCODONE-ACETAMINOPHEN 5-325 MG PO TABS: 2.0000 | ORAL_TABLET | Freq: Four times a day (QID) | ORAL | 0 refills | Status: DC | PRN

## 2016-07-25 MED ORDER — BUPIVACAINE-EPINEPHRINE (PF) 0.5% -1:200000 IJ SOLN
1.8000 mL | Freq: Once | INTRAMUSCULAR | Status: AC
  Administered 2016-07-25: 1.8 mL
  Filled 2016-07-25: qty 1.8

## 2016-07-25 NOTE — Telephone Encounter (Signed)
Pt had some neck pain recently and hx of surgery. Then he went to ED. So will you see the referrals placed recently and will you try to expidite those referrals.

## 2016-07-25 NOTE — ED Provider Notes (Signed)
[place] DEPT [facility] Provider Note: Georgena Spurling, MD, FACEP  CSN: [id number] MRN: [id number] ARRIVAL: 07/24/16 at 2151 ROOM: [place]  Neck Pain   [place]  Brandon Mason is a 83 y.o. male with a history of neck surgery. He is here with a six-day history of pain in the right occipital mass that radiates around to the right side of his scalp. He describes the pain as sharp and severe. It is somewhat worse with movement of his neck. He was seen by his PCP yesterday and was started on prednisone. He was advised not to take nonsteroidals with this. The pain worsened and he is unable to sleep this morning. He has taken hydrocodone in the past but is out. He takes tramadol for restless leg syndrome but this has not adequately treated his neck pain.  Consultation with the [facility] state controlled substances database reveals the patient has received a single prescription for hydrocodone in October of last year as well as 8 prescriptions for tramadol in the last year..    Past Medical History:  Diagnosis Date  . Abnormality of gait 02/09/2013  . Arthritis   . Cancer ([place])    basal cell skin cancer  . Cardiomegaly   . Chronic kidney disease    BPH  . Chronic leg pain   . Hypersomnia   . Hypertension   . Hypoxia    pulmonary  . Neuromuscular disorder (HCC)    restless leg syndrome   . Nocturia   . Prostate disorder   . Restless legs syndrome (RLS) 02/09/2013  . Restless legs syndrome with nocturnal myoclonus   . RLS (restless legs syndrome)    x 40 years  . Shortness of breath    with exertion   . Sleep apnea    uses cpap  . Stroke [facility])    minor stroke in 2003 - no lasting side effects  . UARS (upper airway resistance syndrome) 06/28/2014    Past Surgical History:  Procedure Laterality Date  . ANTERIOR CERVICAL DECOMP/DISCECTOMY FUSION N/A 03/29/2016   Procedure: ANTERIOR CERVICAL DECOMPRESSION/DISCECTOMY FUSION CERVICAL  THREE- CERVICAL FOUR;  Surgeon: Ashok Pall, MD;  Location: [place] [facility];  Service: Neurosurgery;  Laterality: N/A;  . BACK SURGERY  2003   L4-5  . CATARACT EXTRACTION    . HERNIA REPAIR     inguinal hernia  . TRANSURETHRAL PROSTATECTOMY WITH GYRUS INSTRUMENTS  04/28/2012   Procedure: TRANSURETHRAL PROSTATECTOMY WITH GYRUS INSTRUMENTS;  Surgeon: Fredricka Bonine, MD;  Location: [facility];  Service: Urology;  Laterality: N/A;  . TRANSURETHRAL RESECTION OF PROSTATE  03/24/2012   Procedure: TRANSURETHRAL RESECTION OF THE PROSTATE (TURP);  Surgeon: Fredricka Bonine, MD;  Location: [facility];  Service: Urology;  Laterality: N/A;  with gyrus ,Greenlight PVP laser of Prostate    Family History  Problem Relation Age of Onset  . Restless legs syndrome Mother   . Restless legs syndrome Daughter     Social History  Substance Use Topics  . Smoking status: Never Smoker  . Smokeless tobacco: Never Used  . Alcohol use 1.2 oz/week    2 Cans of beer per week     Comment: weekly    Prior to Admission medications   Medication Sig Start Date End Date Taking? Authorizing Provider  aspirin 325 MG tablet Take 325 mg by mouth daily.    Historical Provider, MD  Calcium Carb-Cholecalciferol (CALCIUM 600 + D PO) Take 1 capsule  by mouth daily.     Historical Provider, MD  furosemide (LASIX) 40 MG tablet Take 40 mg by mouth daily.  07/31/15   Historical Provider, MD  gabapentin (NEURONTIN) 600 MG tablet Take 1 tablet (600 mg total) by mouth 3 (three) times daily. 07/04/16   Percell Miller Saguier, [name]  HYDROcodone-acetaminophen (NORCO) 5-325 MG tablet Take 2 tablets by mouth every 6 (six) hours as needed (may cause constipation). 07/25/16   Haneen Bernales, MD  lisinopril (PRINIVIL,ZESTRIL) 20 MG tablet Take 20 mg by mouth daily.     Historical Provider, MD  predniSONE (DELTASONE) 10 MG tablet 4 tablets x 2 days, 3 tabs x 2 days, 2 tabs x 2 days, 1 tab x 2 days 07/23/16   Colon Branch, MD  sertraline (ZOLOFT) 25 MG  tablet Take 1 tablet (25 mg total) by mouth daily. 07/16/16   Percell Miller Saguier, [name]  traMADol (ULTRAM) 50 MG tablet Take 1 tablet (50 mg total) by mouth 2 (two) times daily. 07/04/16   Percell Miller Saguier, [name]  vitamin B-12 (CYANOCOBALAMIN) 1000 MCG tablet Take 1,000 mcg by mouth daily.    Historical Provider, MD    Allergies Patient has no known allergies.   REVIEW OF SYSTEMS  Negative except as noted here or in the History of Present Illness.   PHYSICAL EXAMINATION  Initial Vital Signs Blood pressure 151/90, pulse 97, temperature 97.7 F (36.5 C), temperature source Oral, resp. rate 14, SpO2 95 %.  Examination General: Well-developed, well-nourished male in no acute distress; appearance consistent with age of record HENT: normocephalic; atraumatic Eyes: pupils equal, round and reactive to light; extraocular muscles intact Neck: supple; flexion and extension of neck reproduces pain Heart: regular rate and rhythm Lungs: clear to auscultation bilaterally Abdomen: soft; nondistended; nontender; bowel sounds present Extremities: No deformity; full range of motion Neurologic: Awake, alert and oriented; motor function intact in all extremities and symmetric; no facial droop Skin: Warm and dry Psychiatric: Normal mood and affect   RESULTS  Summary of this visit's results, reviewed by myself:   EKG Interpretation  Date/Time:    Ventricular Rate:    PR Interval:    QRS Duration:   QT Interval:    QTC Calculation:   R Axis:     Text Interpretation:        Laboratory Studies: No results found for this or any previous visit (from the past 24 hour(s)). Imaging Studies: Dg Cervical Spine Complete  Result Date: 07/23/2016 CLINICAL DATA:  Neck pain.  No reported injury. EXAM: CERVICAL SPINE - COMPLETE 4+ VIEW COMPARISON:  CT 05/02/2016. FINDINGS: Prior C3-C4 anterior fusion. Hardware intact. Diffuse osteopenia degenerative change. No acute bony abnormality. Carotid vascular  calcification noted bilateral. Biapical pleural thickening noted consistent with scarring. IMPRESSION: 1.  C3-C4 anterior fusion with anatomic alignment.  Hardware intact. 2. Diffuse osteopenia and degenerative change. No acute bony abnormality . 3. Bilateral carotid atherosclerotic vascular disease . Electronically Signed   By: Marcello Moores  Register   On: 07/23/2016 13:25    ED COURSE  Nursing notes and initial vitals signs, including pulse oximetry, reviewed.  Vitals:   07/24/16 2158  BP: 151/90  Pulse: 97  Resp: 14  Temp: 97.7 F (36.5 C)  TempSrc: Oral  SpO2: 95%   We'll refill the patient's hydrocodone and he will follow-up with Dr. Christella Noa next week.  PROCEDURES   OCCIPITAL BLOCK 1.8 milliliters of 0.5% bupivacaine with epinephrine were injected into the soft tissue of the posterior neck just above and to  the right of the vertebral prominens. The patient tolerated this well and there were no immediate complications. Adequate analgesia was obtained.   ED DIAGNOSES     ICD-9-CM ICD-10-CM   1. Occipital neuralgia of right side 723.8 M54.81        Shanon Rosser, MD 07/25/16 0147

## 2016-07-30 ENCOUNTER — Telehealth: Payer: Self-pay | Admitting: Medical

## 2016-07-30 ENCOUNTER — Encounter: Payer: Self-pay | Admitting: Gastroenterology

## 2016-07-30 DIAGNOSIS — M5021 Other cervical disc displacement,  high cervical region: Secondary | ICD-10-CM | POA: Diagnosis not present

## 2016-07-30 DIAGNOSIS — I10 Essential (primary) hypertension: Secondary | ICD-10-CM | POA: Diagnosis not present

## 2016-07-30 DIAGNOSIS — R143 Flatulence: Secondary | ICD-10-CM

## 2016-07-30 DIAGNOSIS — Z6827 Body mass index (BMI) 27.0-27.9, adult: Secondary | ICD-10-CM | POA: Diagnosis not present

## 2016-07-30 DIAGNOSIS — R140 Abdominal distension (gaseous): Secondary | ICD-10-CM

## 2016-07-30 DIAGNOSIS — M4712 Other spondylosis with myelopathy, cervical region: Secondary | ICD-10-CM | POA: Diagnosis not present

## 2016-07-30 NOTE — Telephone Encounter (Signed)
Caller name: Relationship to patient:Self Can be reached: [phone] Pharmacy:  Reason for call: Patient is requesting a referral to a specialist because he is still experiencing a large amount of gas. States he was seen 1/16 for this problem and was given something to empty his bowels but it has not helped. Plse advise

## 2016-07-30 NOTE — Telephone Encounter (Signed)
Caller name: Relationship to patient:Self Can be reached: [phone] Pharmacy:  Reason for call: Request results of UA from last week

## 2016-07-30 NOTE — Telephone Encounter (Signed)
Message left on VM @ [phone] informing patient that referral has been placed and to call the office if he does not get a call within a week

## 2016-07-30 NOTE — Telephone Encounter (Signed)
Ordered by Dr. Larose Kells, defer to medical assistant.

## 2016-07-30 NOTE — Telephone Encounter (Signed)
Referral to GI placed. Notify pt that if by one week no reaches out to him call Anderson Malta for update on referral.

## 2016-07-31 NOTE — Telephone Encounter (Signed)
Referral has been resent, awaiting appt

## 2016-07-31 NOTE — Telephone Encounter (Signed)
Spoke w/ Pt, informed of results. He has not heard from Urology regarding appt. Delsa Sale- referral was placed to [facility] on 07/23/2016, can you check status? Thank you.

## 2016-08-16 ENCOUNTER — Ambulatory Visit (INDEPENDENT_AMBULATORY_CARE_PROVIDER_SITE_OTHER): Payer: Medicare Other | Admitting: Gastroenterology

## 2016-08-16 ENCOUNTER — Encounter (INDEPENDENT_AMBULATORY_CARE_PROVIDER_SITE_OTHER): Payer: Self-pay

## 2016-08-16 ENCOUNTER — Encounter: Payer: Self-pay | Admitting: Gastroenterology

## 2016-08-16 VITALS — BP 160/88 | HR 84 | Ht 68.0 in | Wt 180.2 lb

## 2016-08-16 DIAGNOSIS — R143 Flatulence: Secondary | ICD-10-CM | POA: Diagnosis not present

## 2016-08-16 NOTE — Progress Notes (Signed)
[facility] Consult Note:  History: Brandon Mason 08/16/2016  Referring physician: Mackie Pai, [name]  Reason for consult/chief complaint: Bloated (constant x 2 months; + excessive belching; no change in bowels; no change in appetite)   Subjective  HPI:  This is an 83 year old man referred to [place] for flatulence. He feels that for the last several months he has had an increasing production of bowel gas, it is foul-smelling and he says it is really bothering his wife. He has had no new medicines no other sicknesses note change in medications. He denies abdominal pain diarrhea or rectal bleeding. He recently had a KUB that apparently was consistent with constipation, he received a laxative treatment that he says completely cleaned him out but did not change his gas production. His appetite is good and his weight stable. He denies early satiety, nausea, vomiting, dysphagia or weight loss.   ROS:  Review of Systems  Constitutional: Negative for appetite change and unexpected weight change.  HENT: Negative for mouth sores and voice change.   Eyes: Negative for pain and redness.  Respiratory: Positive for shortness of breath. Negative for cough.   Cardiovascular: Negative for chest pain and palpitations.  Genitourinary: Positive for frequency. Negative for dysuria and hematuria.  Musculoskeletal: Positive for arthralgias. Negative for myalgias.  Skin: Negative for pallor and rash.  Neurological: Negative for weakness and headaches.  Hematological: Negative for adenopathy.  Psychiatric/Behavioral: Positive for dysphoric mood. The patient is nervous/anxious.      Past Medical History: Past Medical History:  Diagnosis Date  . Abnormality of gait 02/09/2013  . Arthritis   . Cancer ([place])    basal cell skin cancer  . Cardiomegaly   . Chronic kidney disease    BPH  . Chronic leg pain   . Hypersomnia   . Hypertension   . Hypoxia    pulmonary  . Neuromuscular disorder  (HCC)    restless leg syndrome   . Nocturia   . Prostate disorder   . Restless legs syndrome (RLS) 02/09/2013  . Restless legs syndrome with nocturnal myoclonus   . RLS (restless legs syndrome)    x 40 years  . Shortness of breath    with exertion   . Sleep apnea    uses cpap  . Stroke [facility])    minor stroke in 2003 - no lasting side effects  . UARS (upper airway resistance syndrome) 06/28/2014     Past Surgical History: Past Surgical History:  Procedure Laterality Date  . ANTERIOR CERVICAL DECOMP/DISCECTOMY FUSION N/A 03/29/2016   Procedure: ANTERIOR CERVICAL DECOMPRESSION/DISCECTOMY FUSION CERVICAL THREE- CERVICAL FOUR;  Surgeon: Ashok Pall, MD;  Location: [place] [facility];  Service: Neurosurgery;  Laterality: N/A;  . BACK SURGERY  2003   L4-5  . CATARACT EXTRACTION Bilateral   . HERNIA REPAIR     inguinal hernia  . TRANSURETHRAL PROSTATECTOMY WITH GYRUS INSTRUMENTS  04/28/2012   Procedure: TRANSURETHRAL PROSTATECTOMY WITH GYRUS INSTRUMENTS;  Surgeon: Fredricka Bonine, MD;  Location: [facility];  Service: Urology;  Laterality: N/A;  . TRANSURETHRAL RESECTION OF PROSTATE  03/24/2012   Procedure: TRANSURETHRAL RESECTION OF THE PROSTATE (TURP);  Surgeon: Fredricka Bonine, MD;  Location: [facility];  Service: Urology;  Laterality: N/A;  with gyrus ,Greenlight PVP laser of Prostate     Family History: Family History  Problem Relation Age of Onset  . Restless legs syndrome Mother   . Restless legs syndrome Daughter   . Esophageal cancer Neg Hx   . Colon  cancer Neg Hx   . Pancreatic cancer Neg Hx   . Stomach cancer Neg Hx     Social History: Social History   Social History  . Marital status: Married    Spouse name: [name]  . Number of children: 5  . Years of education: 12   Occupational History  . retired      [name] for a [employer]  . Smoking status: Never Smoker  . Smokeless tobacco: Never Used  . Alcohol use 1.2 oz/week     2 Cans of beer per week     Comment: weekly  . Drug use: No  . Sexual activity: Not Asked   Other Topics Concern  . None   Social History Narrative   Patient lives at home with his wife [name].   Patient is retired.    Patient has 5 children.   Patient has a high school education.   Patient is right-handed.   Patient drinks very little caffeine.    Allergies: No Known Allergies  Outpatient Meds: Current Outpatient Prescriptions  Medication Sig Dispense Refill  . aspirin 325 MG tablet Take 325 mg by mouth daily.    . Calcium Carb-Cholecalciferol (CALCIUM 600 + D PO) Take 1 capsule by mouth daily.     . furosemide (LASIX) 40 MG tablet Take 40 mg by mouth daily.     Marland Kitchen gabapentin (NEURONTIN) 600 MG tablet Take 1 tablet (600 mg total) by mouth 3 (three) times daily. 270 tablet 1  . lisinopril (PRINIVIL,ZESTRIL) 20 MG tablet Take 20 mg by mouth daily.     . traMADol (ULTRAM) 50 MG tablet Take 1 tablet (50 mg total) by mouth 2 (two) times daily. 60 tablet 0  . vitamin B-12 (CYANOCOBALAMIN) 1000 MCG tablet Take 1,000 mcg by mouth daily.     No current facility-administered medications for this visit.       ___________________________________________________________________ Objective   Exam:  BP (!) 160/88   Pulse 84   Ht 5\' 8"  (1.727 m)   Wt 180 lb 3.2 oz (81.7 kg)   BMI 27.40 kg/m    General: this is a(n) well-appearing elderly man, no muscle wasting . Somewhat restricted affect  Eyes: sclera anicteric, no redness  ENT: oral mucosa moist without lesions, no cervical or supraclavicular lymphadenopathy, good dentition  CV: RRR without murmur, S1/S2, no JVD, no peripheral edema  Resp: clear to auscultation bilaterally, normal RR and effort noted  GI: soft, no tenderness, with active bowel sounds. No guarding or palpable organomegaly noted.  Skin; warm and dry, no rash or jaundice noted  Neuro: awake, alert and oriented x 3. Normal gross motor function and fluent  speech  Labs:  Oct 2017, nml CBC , CMP except albumin 3.2  Radiologic Studies:  KUB last month - large amount of stool. Film personally reviewed Marcello Moores there is stool extending into the right colon.  Assessment: Encounter Diagnosis  Name Primary?  . Flatulence Yes    This does not sound pathological, especially given his lack of other GI symptoms. I explained to him that this is nearly always diet related, and I gave him some written dietary instructions. I have no further testing planned at this point he will see me as needed.  Thank you for the courtesy of this consult.  Please call me with any questions or concerns.  Nelida Meuse III  CC: Mackie Pai, [name]

## 2016-08-16 NOTE — Patient Instructions (Addendum)
Food Guidelines for bloating and gas  Many people have difficulty digesting certain foods, causing a variety of distressing and embarrassing symptoms such as abdominal pain, bloating and gas.  These foods may need to be avoided or consumed in small amounts.  Here are some tips that might be helpful for you.  1.   Lactose intolerance is the difficulty or complete inability to digest lactose, the natural sugar in milk and anything made from milk.  This condition is harmless, common, and can begin any time during life.  Some people can digest a modest amount of lactose while others cannot tolerate any.  Also, not all dairy products contain equal amounts of lactose.  For example, hard cheeses such as parmesan have less lactose than soft cheeses such as cheddar.  Yogurt has less lactose than milk or cheese.  Many packaged foods (even many brands of bread) have milk, so read ingredient lists carefully.  It is difficult to test for lactose intolerance, so just try avoiding lactose as much as possible for a week and see what happens with your symptoms.  If you seem to be lactose intolerant, the best plan is to avoid it (but make sure you get calcium from another source).  The next best thing is to use lactase enzyme supplements, available over the counter everywhere.  Just know that many lactose intolerant people need to take several tablets with each serving of dairy to avoid symptoms.  Lastly, a lot of restaurant food is made with milk or butter.  Many are things you might not suspect, such as mashed potatoes, rice and pasta (cooked with butter) and "grilled" items.  If you are lactose intolerant, it never hurts to ask your server what has milk or butter.  2.   Fiber is an important part of your diet, but not all fiber is well-tolerated.  Insoluble fiber such as bran is often consumed by normal gut bacteria and converted into gas.  Soluble fiber such as oats, squash, carrots and green beans are typically tolerated  better.  3.   Some types of carbohydrates can be poorly digested.  Examples include: fructose (apples, cherries, pears, raisins and other dried fruits), fructans (onions, zucchini, large amounts of wheat), sorbitol/mannitol/xylitol and sucralose/Splenda (common artificial sweeteners), and raffinose (lentils, broccoli, cabbage, asparagus, brussel sprouts, many types of beans).  Do a Development worker, community for [employer] and you will find helpful information. Beano, a dietary supplement, will often help with raffinose-containing foods.  As with lactase tablets, you may need several per serving.  4.   Whenever possible, avoid processed food&meats and chemical additives.  High fructose corn syrup, a common sweetener, may be difficult to digest.  Eggs and soy (comes from the soybean, and added to many foods now) are the other most common bloating/gassy foods. If you are age 59 or older, your body mass index should be between 23-30. Your Body mass index is 27.4 kg/m. If this is out of the aforementioned range listed, please consider follow up with your Primary Care Provider.  If you are age 81 or older, your body mass index should be between 23-30. Your Body mass index is 27.4 kg/m. If this is out of the aforementioned range listed, please consider follow up with your Primary Care Provider.  If you are age 51 or younger, your body mass index should be between 19-25. Your Body mass index is 27.4 kg/m. If this is out of the aformentioned range listed, please consider follow up with your  Primary Care Provider.   Please follow up as needed.   - Dr. [facility]

## 2016-08-17 ENCOUNTER — Other Ambulatory Visit: Payer: Self-pay | Admitting: Medical

## 2016-08-19 NOTE — Telephone Encounter (Signed)
I refilled his tramadol. Printed rx and placed it on your desk. Pt can pick up rx or you can fax. Thanks.

## 2016-08-19 NOTE — Telephone Encounter (Signed)
Last seen 07/23/16  Last filled 07/04/16 [id number]  Please advise PC

## 2016-08-20 NOTE — Telephone Encounter (Signed)
Rx request faxed to pharmacy/[facility]  

## 2016-09-16 ENCOUNTER — Other Ambulatory Visit: Payer: Self-pay | Admitting: Medical

## 2016-09-18 ENCOUNTER — Telehealth: Payer: Self-pay | Admitting: Medical

## 2016-09-18 NOTE — Telephone Encounter (Signed)
°  Relation to TV:GVSY Call back number: [phone] Pharmacy:[facility]  Reason for call: pt is needed rx lisinopril (PRINIVIL,ZESTRIL) 20 MG tablet   and traMADol (ULTRAM) 50 MG tablet, pt states pharmacy informed him that the request was faxed on Monday.

## 2016-09-19 MED ORDER — LISINOPRIL 20 MG PO TABS: 20.0000 mg | ORAL_TABLET | Freq: Every day | ORAL | 0 refills | Status: DC

## 2016-09-19 NOTE — Telephone Encounter (Signed)
rx lisinopril sent to the pharmacy.

## 2016-09-19 NOTE — Telephone Encounter (Signed)
Pt should already have tramadol. On his old med list it does have lisninopril listed 08-2015. Maybe historical provider. I am not aware if I have filled this for him? Ask him to come in schedule with me. Offer 1-2 pm appointment. Explain want to get bp check.  It was on each med list on prior visits.   I called pharmacy and Dr. Bebe Shaggy rx'd. I am writing 30 tabs only. Call pt and advise he needs to be seen within a month.

## 2016-09-19 NOTE — Telephone Encounter (Signed)
No history on Lisinopril and Tramadol request faxed over by karen to provider on 09/16/16; forwarding again/[facility] 03/22

## 2016-09-20 ENCOUNTER — Other Ambulatory Visit: Payer: Self-pay | Admitting: Medical

## 2016-09-20 NOTE — Telephone Encounter (Signed)
Left message with spouse for pt to return my call.

## 2016-09-20 NOTE — Telephone Encounter (Signed)
Pt returned my call and was notified of below. Pt scheduled appt for Monday at 1pm.

## 2016-09-20 NOTE — Telephone Encounter (Signed)
Patient called back to follow up on request for medication. States he needs his meds.

## 2016-09-20 NOTE — Telephone Encounter (Signed)
Previous phone note of 09/18/16 said pt should already have tramadol. EPIC shows last tramadol by [place] was 08/19/16, #60.  UDS 05/01/16 and due 10/2016. Next OV is Monday.  Please advise Tramadol Rx?

## 2016-09-20 NOTE — Telephone Encounter (Signed)
Rx faxed, notified pt's wife.

## 2016-09-20 NOTE — Telephone Encounter (Signed)
Printed the rx and signed. Will you fax over. Thanks for your help.

## 2016-09-20 NOTE — Telephone Encounter (Signed)
See 09/20/16 phone note.

## 2016-09-23 ENCOUNTER — Encounter: Payer: Self-pay | Admitting: Medical

## 2016-09-23 ENCOUNTER — Ambulatory Visit (INDEPENDENT_AMBULATORY_CARE_PROVIDER_SITE_OTHER): Payer: Medicare Other | Admitting: Medical

## 2016-09-23 VITALS — BP 131/75 | HR 83 | Temp 98.5°F | Ht 68.0 in | Wt 178.0 lb

## 2016-09-23 DIAGNOSIS — R143 Flatulence: Secondary | ICD-10-CM | POA: Diagnosis not present

## 2016-09-23 DIAGNOSIS — G2581 Restless legs syndrome: Secondary | ICD-10-CM | POA: Diagnosis not present

## 2016-09-23 DIAGNOSIS — M792 Neuralgia and neuritis, unspecified: Secondary | ICD-10-CM | POA: Diagnosis not present

## 2016-09-23 DIAGNOSIS — M542 Cervicalgia: Secondary | ICD-10-CM | POA: Diagnosis not present

## 2016-09-23 DIAGNOSIS — I10 Essential (primary) hypertension: Secondary | ICD-10-CM

## 2016-09-23 NOTE — Patient Instructions (Addendum)
Bp well controlled. Continue current regimen.  If you get flare of neck pain again let us know as we might refer you back to specialist based on your history.  For restless leg syndrome and neuropathic pain continue tramadol. Recommend establish my chart and send me refill message 3 days in advance before you run out.   Avoid foods that cause flatulence. Can use gas-x on occasion if needed. Example can be found on line.  I provided some websites  Follow up in 3 months or as needed

## 2016-09-23 NOTE — Progress Notes (Signed)
Subjective:    Patient ID: Brandon Mason, male    DOB: Nov 12, 1925, 83 y.o.   MRN: [id number]  HPI  Pt bp is good controlled today. No cardiac or neurologic signs or symptoms.  Pt has restless leg. Recently mix up on filling his tramadol.Mild delay in getting the rx refilled. Ultram helps part of the times. Pt had tried requip in the past and it did not help.  Pt since last visit saw Dr. Larose Kells for neck pain. Then saw the ED. They gave injection of bupiviaine  injected into the soft tissue.(counseled pt not to use any pain medications with tramadol). If any pain meds written by any one else contact us first and get advisement.  Pt has seen by GI for his flatulence pt told by GI that his diet is likely the cause       Review of Systems  Constitutional: Negative for chills, fatigue and fever.  Respiratory: Negative for cough, chest tightness and shortness of breath.   Cardiovascular: Negative for chest pain and palpitations.  Gastrointestinal: Negative for abdominal distention, abdominal pain, anal bleeding, blood in stool, nausea and vomiting.       Flatulence.  Musculoskeletal: Negative for back pain and neck pain.  Skin: Negative for rash.  Neurological: Negative for dizziness, weakness, numbness and headaches.       See hpi.  Hematological: Negative for adenopathy. Does not bruise/bleed easily.  Psychiatric/Behavioral: Negative for behavioral problems, confusion, self-injury and sleep disturbance. The patient is not nervous/anxious.     Past Medical History:  Diagnosis Date  . Abnormality of gait 02/09/2013  . Arthritis   . Cancer ([place])    basal cell skin cancer  . Cardiomegaly   . Chronic kidney disease    BPH  . Chronic leg pain   . Hypersomnia   . Hypertension   . Hypoxia    pulmonary  . Neuromuscular disorder (HCC)    restless leg syndrome   . Nocturia   . Prostate disorder   . Restless legs syndrome (RLS) 02/09/2013  . Restless legs syndrome with nocturnal  myoclonus   . RLS (restless legs syndrome)    x 40 years  . Shortness of breath    with exertion   . Sleep apnea    uses cpap  . Stroke [facility])    minor stroke in 2003 - no lasting side effects  . UARS (upper airway resistance syndrome) 06/28/2014     Social History   Social History  . Marital status: Married    Spouse name: [name]  . Number of children: 5  . Years of education: 12   Occupational History  . retired      [name] for a [employer]  . Smoking status: Never Smoker  . Smokeless tobacco: Never Used  . Alcohol use 1.2 oz/week    2 Cans of beer per week     Comment: weekly  . Drug use: No  . Sexual activity: Not on file   Other Topics Concern  . Not on file   Social History Narrative   Patient lives at home with his wife [name].   Patient is retired.    Patient has 5 children.   Patient has a high school education.   Patient is right-handed.   Patient drinks very little caffeine.    Past Surgical History:  Procedure Laterality Date  . ANTERIOR CERVICAL DECOMP/DISCECTOMY FUSION N/A 03/29/2016   Procedure: ANTERIOR CERVICAL  DECOMPRESSION/DISCECTOMY FUSION CERVICAL THREE- CERVICAL FOUR;  Surgeon: Ashok Pall, MD;  Location: [place] [facility];  Service: Neurosurgery;  Laterality: N/A;  . BACK SURGERY  2003   L4-5  . CATARACT EXTRACTION Bilateral   . HERNIA REPAIR     inguinal hernia  . TRANSURETHRAL PROSTATECTOMY WITH GYRUS INSTRUMENTS  04/28/2012   Procedure: TRANSURETHRAL PROSTATECTOMY WITH GYRUS INSTRUMENTS;  Surgeon: Fredricka Bonine, MD;  Location: [facility];  Service: Urology;  Laterality: N/A;  . TRANSURETHRAL RESECTION OF PROSTATE  03/24/2012   Procedure: TRANSURETHRAL RESECTION OF THE PROSTATE (TURP);  Surgeon: Fredricka Bonine, MD;  Location: [facility];  Service: Urology;  Laterality: N/A;  with gyrus ,Greenlight PVP laser of Prostate    Family History  Problem Relation Age of Onset  . Restless legs syndrome  Mother   . Restless legs syndrome Daughter   . Esophageal cancer Neg Hx   . Colon cancer Neg Hx   . Pancreatic cancer Neg Hx   . Stomach cancer Neg Hx     No Known Allergies  Current Outpatient Prescriptions on File Prior to Visit  Medication Sig Dispense Refill  . aspirin 325 MG tablet Take 325 mg by mouth daily.    . Calcium Carb-Cholecalciferol (CALCIUM 600 + D PO) Take 1 capsule by mouth daily.     . furosemide (LASIX) 40 MG tablet Take 40 mg by mouth daily.     Marland Kitchen gabapentin (NEURONTIN) 600 MG tablet Take 1 tablet (600 mg total) by mouth 3 (three) times daily. 270 tablet 1  . lisinopril (PRINIVIL,ZESTRIL) 20 MG tablet Take 1 tablet (20 mg total) by mouth daily. 30 tablet 0  . traMADol (ULTRAM) 50 MG tablet TAKE 1 TABLET BY MOUTH TWICE DAILY 60 tablet 0  . vitamin B-12 (CYANOCOBALAMIN) 1000 MCG tablet Take 1,000 mcg by mouth daily.     No current facility-administered medications on file prior to visit.     BP 131/75   Pulse 83   Temp 98.5 F (36.9 C) (Oral)   Ht 5\' 8"  (1.727 m)   Wt 178 lb (80.7 kg)   SpO2 96%   BMI 27.06 kg/m       Objective:   Physical Exam  General Mental Status- Alert. General Appearance- Not in acute distress.   Skin General: Color- Normal Color. Moisture- Normal Moisture.  Neck Carotid Arteries- Normal color. Moisture- Normal Moisture. No carotid bruits. No JVD.  Chest and Lung Exam Auscultation: Breath Sounds:-Normal.  Cardiovascular Auscultation:Rythm- Regular. Murmurs & Other Heart Sounds:Auscultation of the heart reveals- No Murmurs.  Abdomen Inspection:-Inspeection Normal. Palpation/Percussion:Note:No mass. Palpation and Percussion of the abdomen reveal- Non Tender, Non Distended + BS, no rebound or guarding.  Neurologic Cranial Nerve exam:- CN III-XII intact(No nystagmus), symmetric smile. Strength:- 5/5 equal and symmetric strength both upper and lower extremities.      Assessment & Plan:  Bp well controlled. Continue  current regimen.  If you get flare of neck pain again let us know as we might refer you back to specialist based on your history.  For restless leg syndrome and neuropathic pain continue tramadol. Recommend establish my chart and send me refill message 3 days in advance before you run out.   Avoid foods that cause flatulence. Can use gas-x on occasion if needed. Example can be found on line.  I provided some websites  Follow up in 3 months or as needed  Dierdre Mccalip, [place], [place]

## 2016-09-23 NOTE — Progress Notes (Signed)
Pre visit review using our clinic tool,if applicable. No additional management support is needed unless otherwise documented below in the visit note.  

## 2016-09-25 DIAGNOSIS — R310 Gross hematuria: Secondary | ICD-10-CM | POA: Diagnosis not present

## 2016-09-26 DIAGNOSIS — R310 Gross hematuria: Secondary | ICD-10-CM | POA: Diagnosis not present

## 2016-10-01 ENCOUNTER — Telehealth: Payer: Self-pay | Admitting: *Deleted

## 2016-10-01 MED ORDER — LISINOPRIL 20 MG PO TABS: 20.0000 mg | ORAL_TABLET | Freq: Every day | ORAL | 0 refills | Status: DC

## 2016-10-01 NOTE — Telephone Encounter (Signed)
Received fax from [facility] that pt was requesting rx for 90 day supply of lisinopril. Rx re-sent.

## 2016-10-03 DIAGNOSIS — D3132 Benign neoplasm of left choroid: Secondary | ICD-10-CM | POA: Diagnosis not present

## 2016-10-03 DIAGNOSIS — H35343 Macular cyst, hole, or pseudohole, bilateral: Secondary | ICD-10-CM | POA: Diagnosis not present

## 2016-10-03 DIAGNOSIS — H524 Presbyopia: Secondary | ICD-10-CM | POA: Diagnosis not present

## 2016-10-03 DIAGNOSIS — E119 Type 2 diabetes mellitus without complications: Secondary | ICD-10-CM | POA: Diagnosis not present

## 2016-10-03 DIAGNOSIS — H52203 Unspecified astigmatism, bilateral: Secondary | ICD-10-CM | POA: Diagnosis not present

## 2016-10-03 DIAGNOSIS — H5213 Myopia, bilateral: Secondary | ICD-10-CM | POA: Diagnosis not present

## 2016-10-03 DIAGNOSIS — Z961 Presence of intraocular lens: Secondary | ICD-10-CM | POA: Diagnosis not present

## 2016-10-09 ENCOUNTER — Encounter: Payer: Self-pay | Admitting: Medical

## 2016-10-10 MED ORDER — GABAPENTIN 600 MG PO TABS: 600.0000 mg | ORAL_TABLET | Freq: Three times a day (TID) | ORAL | 1 refills | Status: DC

## 2016-10-20 ENCOUNTER — Telehealth: Payer: Self-pay | Admitting: Medical

## 2016-10-21 ENCOUNTER — Other Ambulatory Visit: Payer: Self-pay | Admitting: Medical

## 2016-10-23 ENCOUNTER — Other Ambulatory Visit: Payer: Self-pay | Admitting: Medical

## 2016-10-23 MED ORDER — TRAMADOL HCL 50 MG PO TABS: 50.0000 mg | ORAL_TABLET | Freq: Two times a day (BID) | ORAL | 0 refills | Status: DC

## 2016-10-23 NOTE — Telephone Encounter (Signed)
Pt is requesting refill on Tramadol.  Last OV: 09/23/2016 Last Fill: 09/19/2016 #60 and 0RF UDS: 05/01/2016 Low risk  Please advise.

## 2016-10-23 NOTE — Telephone Encounter (Signed)
I printed the tramadol rx. I will sign the rx. Will you fax it to his pharmacy. See Jacqualyn Posey note. It specifies which pharmacy he wants rx sent. Notify pt rx sent in.

## 2016-10-24 NOTE — Telephone Encounter (Signed)
Faxed rx to pharmacy  

## 2016-11-04 ENCOUNTER — Emergency Department (HOSPITAL_BASED_OUTPATIENT_CLINIC_OR_DEPARTMENT_OTHER)
Admission: EM | Admit: 2016-11-04 | Discharge: 2016-11-04 | Disposition: A | Discharge status: Home / Self Care | Payer: Medicare Other | Attending: Emergency Medicine | Admitting: Emergency Medicine

## 2016-11-04 ENCOUNTER — Encounter (HOSPITAL_BASED_OUTPATIENT_CLINIC_OR_DEPARTMENT_OTHER): Payer: Self-pay | Admitting: *Deleted

## 2016-11-04 DIAGNOSIS — G8929 Other chronic pain: Secondary | ICD-10-CM | POA: Insufficient documentation

## 2016-11-04 DIAGNOSIS — R197 Diarrhea, unspecified: Secondary | ICD-10-CM | POA: Insufficient documentation

## 2016-11-04 DIAGNOSIS — M79604 Pain in right leg: Secondary | ICD-10-CM | POA: Diagnosis not present

## 2016-11-04 DIAGNOSIS — Z85828 Personal history of other malignant neoplasm of skin: Secondary | ICD-10-CM | POA: Insufficient documentation

## 2016-11-04 DIAGNOSIS — R112 Nausea with vomiting, unspecified: Secondary | ICD-10-CM | POA: Insufficient documentation

## 2016-11-04 DIAGNOSIS — N189 Chronic kidney disease, unspecified: Secondary | ICD-10-CM | POA: Diagnosis not present

## 2016-11-04 DIAGNOSIS — R109 Unspecified abdominal pain: Secondary | ICD-10-CM | POA: Insufficient documentation

## 2016-11-04 DIAGNOSIS — Z7982 Long term (current) use of aspirin: Secondary | ICD-10-CM | POA: Diagnosis not present

## 2016-11-04 DIAGNOSIS — M79605 Pain in left leg: Secondary | ICD-10-CM | POA: Diagnosis not present

## 2016-11-04 DIAGNOSIS — Z79899 Other long term (current) drug therapy: Secondary | ICD-10-CM | POA: Insufficient documentation

## 2016-11-04 DIAGNOSIS — E876 Hypokalemia: Secondary | ICD-10-CM | POA: Diagnosis not present

## 2016-11-04 DIAGNOSIS — I129 Hypertensive chronic kidney disease with stage 1 through stage 4 chronic kidney disease, or unspecified chronic kidney disease: Secondary | ICD-10-CM | POA: Insufficient documentation

## 2016-11-04 LAB — COMPREHENSIVE METABOLIC PANEL
ALK PHOS: 59 U/L (ref 38–126)
ALT: 11 U/L — ABNORMAL LOW (ref 17–63)
ANION GAP: 10 (ref 5–15)
AST: 18 U/L (ref 15–41)
Albumin: 3.9 g/dL (ref 3.5–5.0)
BILIRUBIN TOTAL: 0.9 mg/dL (ref 0.3–1.2)
BUN: 24 mg/dL — ABNORMAL HIGH (ref 6–20)
CALCIUM: 9.1 mg/dL (ref 8.9–10.3)
CO2: 27 mmol/L (ref 22–32)
Chloride: 102 mmol/L (ref 101–111)
Creatinine, Ser: 0.98 mg/dL (ref 0.61–1.24)
GFR calc non Af Amer: >60 mL/min (ref >60)
Glucose, Bld: 110 mg/dL — ABNORMAL HIGH (ref 65–99)
POTASSIUM: 3.3 mmol/L — AB (ref 3.5–5.1)
SODIUM: 139 mmol/L (ref 135–145)
TOTAL PROTEIN: 7.1 g/dL (ref 6.5–8.1)

## 2016-11-04 LAB — URINALYSIS, ROUTINE W REFLEX MICROSCOPIC
Bilirubin Urine: NEGATIVE
GLUCOSE, UA: NEGATIVE mg/dL
HGB URINE DIPSTICK: NEGATIVE
Ketones, ur: 15 mg/dL — AB
Leukocytes, UA: NEGATIVE
Nitrite: NEGATIVE
PROTEIN: NEGATIVE mg/dL
SPECIFIC GRAVITY, URINE: 1.011 (ref 1.005–1.030)
pH: 8 (ref 5.0–8.0)

## 2016-11-04 LAB — CBC WITH DIFFERENTIAL/PLATELET
BASOS ABS: 0 10*3/uL (ref 0.0–0.1)
BASOS PCT: 0 %
Eosinophils Absolute: 0.1 10*3/uL (ref 0.0–0.7)
Eosinophils Relative: 2 %
HEMATOCRIT: 38 % — AB (ref 39.0–52.0)
HEMOGLOBIN: 13.1 g/dL (ref 13.0–17.0)
LYMPHS PCT: 22 %
Lymphs Abs: 1.6 10*3/uL (ref 0.7–4.0)
MCH: 33.7 pg (ref 26.0–34.0)
MCHC: 34.5 g/dL (ref 30.0–36.0)
MCV: 97.7 fL (ref 78.0–100.0)
Monocytes Absolute: 0.7 10*3/uL (ref 0.1–1.0)
Monocytes Relative: 10 %
NEUTROS ABS: 4.7 10*3/uL (ref 1.7–7.7)
NEUTROS PCT: 66 %
Platelets: 219 10*3/uL (ref 150–400)
RBC: 3.89 MIL/uL — ABNORMAL LOW (ref 4.22–5.81)
RDW: 12.1 % (ref 11.5–15.5)
WBC: 7 10*3/uL (ref 4.0–10.5)

## 2016-11-04 LAB — TROPONIN I

## 2016-11-04 MED ORDER — POTASSIUM CHLORIDE CRYS ER 20 MEQ PO TBCR
40.0000 meq | EXTENDED_RELEASE_TABLET | Freq: Once | ORAL | Status: AC
  Administered 2016-11-04: 40 meq via ORAL
  Filled 2016-11-04: qty 2

## 2016-11-04 MED ORDER — METOCLOPRAMIDE HCL 5 MG/ML IJ SOLN
5.0000 mg | Freq: Once | INTRAMUSCULAR | Status: AC
  Administered 2016-11-04: 5 mg via INTRAVENOUS
  Filled 2016-11-04: qty 2

## 2016-11-04 MED ORDER — SODIUM CHLORIDE 0.9 % IV BOLUS (SEPSIS)
1000.0000 mL | Freq: Once | INTRAVENOUS | Status: AC
  Administered 2016-11-04: 1000 mL via INTRAVENOUS

## 2016-11-04 MED ORDER — METOCLOPRAMIDE HCL 10 MG PO TABS: 5.0000 mg | ORAL_TABLET | Freq: Four times a day (QID) | ORAL | 0 refills | Status: DC | PRN

## 2016-11-04 MED FILL — METOCLOPRAMIDE 10 MG TABLET: 10 | 1 days supply | Qty: 4 | Fill #0

## 2016-11-04 NOTE — ED Notes (Signed)
Pt unable to give UA at this time 

## 2016-11-04 NOTE — ED Provider Notes (Signed)
[facility] Provider Note   CSN: [id number] Arrival date & time: 11/04/16  1041     History   Chief Complaint Chief Complaint  Patient presents with  . Leg Pain    HPI Brandon Mason is a 83 y.o. male. Complains of bilateral leg pain for several years. He presents today as he's had generalized weakness for the past 2 days with one episode of vomiting and 2 episodes of diarrhea. No blood per rectum no hematemesis. He does feel a vague discomfort in his abdomen. He reports he fell to the floor when his legs gave out yesterday but did not injure himself. No syncope. No other associated symptoms. No treatment prior to coming here. He's prescribed tramadol and gabapentin for restless leg syndrome however he has not taken any today stating "it only makes me sleep." No other associated symptoms. No treatment prior to coming here HPI  Past Medical History:  Diagnosis Date  . Abnormality of gait 02/09/2013  . Arthritis   . Cancer ([place])    basal cell skin cancer  . Cardiomegaly   . Chronic kidney disease    BPH  . Chronic leg pain   . Hypersomnia   . Hypertension   . Hypoxia    pulmonary  . Neuromuscular disorder (HCC)    restless leg syndrome   . Nocturia   . Prostate disorder   . Restless legs syndrome (RLS) 02/09/2013  . Restless legs syndrome with nocturnal myoclonus   . RLS (restless legs syndrome)    x 40 years  . Shortness of breath    with exertion   . Sleep apnea    uses cpap  . Stroke [facility])    minor stroke in 2003 - no lasting side effects  . UARS (upper airway resistance syndrome) 06/28/2014    Patient Active Problem List   Diagnosis Date Noted  . HNP (herniated nucleus pulposus) with myelopathy, cervical 03/29/2016  . UARS (upper airway resistance syndrome) 06/28/2014  . Restless leg syndrome, familial, uncontrolled 08/20/2013  . Restless legs syndrome with nocturnal myoclonus   . Abnormality of gait 02/09/2013  . Restless legs syndrome (RLS)  02/09/2013  . Acute kidney injury ([place]) 03/27/2012    Past Surgical History:  Procedure Laterality Date  . ANTERIOR CERVICAL DECOMP/DISCECTOMY FUSION N/A 03/29/2016   Procedure: ANTERIOR CERVICAL DECOMPRESSION/DISCECTOMY FUSION CERVICAL THREE- CERVICAL FOUR;  Surgeon: Ashok Pall, MD;  Location: [place] [facility];  Service: Neurosurgery;  Laterality: N/A;  . BACK SURGERY  2003   L4-5  . CATARACT EXTRACTION Bilateral   . HERNIA REPAIR     inguinal hernia  . TRANSURETHRAL PROSTATECTOMY WITH GYRUS INSTRUMENTS  04/28/2012   Procedure: TRANSURETHRAL PROSTATECTOMY WITH GYRUS INSTRUMENTS;  Surgeon: Fredricka Bonine, MD;  Location: [facility];  Service: Urology;  Laterality: N/A;  . TRANSURETHRAL RESECTION OF PROSTATE  03/24/2012   Procedure: TRANSURETHRAL RESECTION OF THE PROSTATE (TURP);  Surgeon: Fredricka Bonine, MD;  Location: [facility];  Service: Urology;  Laterality: N/A;  with gyrus ,Greenlight PVP laser of Prostate       Home Medications    Prior to Admission medications   Medication Sig Start Date End Date Taking? Authorizing Provider  aspirin 325 MG tablet Take 325 mg by mouth daily.   Yes [provider]  Calcium Carb-Cholecalciferol (CALCIUM 600 + D PO) Take 1 capsule by mouth daily.    Yes [provider]  furosemide (LASIX) 40 MG tablet Take 40 mg by mouth daily.  07/31/15  Yes [provider]  gabapentin (NEURONTIN) 600 MG tablet Take 1 tablet (600 mg total) by mouth 3 (three) times daily. 10/10/16  Yes Saguier, Percell Miller, [name]  lisinopril (PRINIVIL,ZESTRIL) 20 MG tablet Take 1 tablet (20 mg total) by mouth daily. 10/01/16  Yes Saguier, Percell Miller, [name]  traMADol (ULTRAM) 50 MG tablet Take 1 tablet (50 mg total) by mouth 2 (two) times daily. 10/23/16  Yes Saguier, Percell Miller, [name]  vitamin B-12 (CYANOCOBALAMIN) 1000 MCG tablet Take 1,000 mcg by mouth daily.   Yes [provider]    Family History Family History  Problem Relation Age of Onset  .  Restless legs syndrome Mother   . Restless legs syndrome Daughter   . Esophageal cancer Neg Hx   . Colon cancer Neg Hx   . Pancreatic cancer Neg Hx   . Stomach cancer Neg Hx     Social History Social History  Substance Use Topics  . Smoking status: Never Smoker  . Smokeless tobacco: Never Used  . Alcohol use 1.2 oz/week    2 Cans of beer per week     Comment: weekly     Allergies   Patient has no known allergies.   Review of Systems Review of Systems  Gastrointestinal: Positive for abdominal pain, diarrhea, nausea and vomiting.  Musculoskeletal: Positive for gait problem and myalgias.       Chronic bilateral leg pain. Walks with cane  Neurological: Positive for weakness.       Generalized weakness  All other systems reviewed and are negative.    Physical Exam Updated Vital Signs BP (!) 158/103 (BP Location: Left Arm)   Pulse 89   Temp 98.6 F (37 C) (Oral)   Resp 18   Ht 5\' 8"  (1.727 m)   Wt 175 lb (79.4 kg)   SpO2 98%   BMI 26.61 kg/m   Physical Exam  Constitutional: He is oriented to person, place, and time. He appears well-developed and well-nourished.  Mildly anxious appearing  HENT:  Head: Normocephalic and atraumatic.  Eyes: Conjunctivae are normal. Pupils are equal, round, and reactive to light.  Neck: Neck supple. No tracheal deviation present. No thyromegaly present.  Cardiovascular: Normal rate, regular rhythm, normal heart sounds and intact distal pulses.   No murmur heard. Radial pulses 2+ bilaterally. DP pulses 2+ bilaterally  Pulmonary/Chest: Effort normal and breath sounds normal.  Abdominal: Soft. Bowel sounds are normal. He exhibits no distension. There is no tenderness.  Genitourinary: Penis normal.  Genitourinary Comments: Norm la male genitalia  Musculoskeletal: Normal range of motion. He exhibits no edema or tenderness.  Neurological: He is alert and oriented to person, place, and time.  Skin: Skin is warm and dry. No rash noted.    Psychiatric: He has a normal mood and affect. His behavior is normal.  Nursing note and vitals reviewed.    ED Treatments / Results  Labs (all labs ordered are listed, but only abnormal results are displayed) Labs Reviewed  COMPREHENSIVE METABOLIC PANEL  CBC WITH DIFFERENTIAL/PLATELET  TROPONIN I  URINALYSIS, ROUTINE W REFLEX MICROSCOPIC   Results for orders placed or performed during the hospital encounter of 11/04/16  Comprehensive metabolic panel  Result Value Ref Range   Sodium 139 135 - 145 mmol/L   Potassium 3.3 (L) 3.5 - 5.1 mmol/L   Chloride 102 101 - 111 mmol/L   CO2 27 22 - 32 mmol/L   Glucose, Bld 110 (H) 65 - 99 mg/dL   BUN 24 (H) 6 -  20 mg/dL   Creatinine, Ser 0.98 0.61 - 1.24 mg/dL   Calcium 9.1 8.9 - 10.3 mg/dL   Total Protein 7.1 6.5 - 8.1 g/dL   Albumin 3.9 3.5 - 5.0 g/dL   AST 18 15 - 41 U/L   ALT 11 (L) 17 - 63 U/L   Alkaline Phosphatase 59 38 - 126 U/L   Total Bilirubin 0.9 0.3 - 1.2 mg/dL   GFR calc non Af Amer >60 >60 mL/min   GFR calc Af Amer >60 >60 mL/min   Anion gap 10 5 - 15  CBC with Differential/Platelet  Result Value Ref Range   WBC 7.0 4.0 - 10.5 K/uL   RBC 3.89 (L) 4.22 - 5.81 MIL/uL   Hemoglobin 13.1 13.0 - 17.0 g/dL   HCT 38.0 (L) 39.0 - 52.0 %   MCV 97.7 78.0 - 100.0 fL   MCH 33.7 26.0 - 34.0 pg   MCHC 34.5 30.0 - 36.0 g/dL   RDW 12.1 11.5 - 15.5 %   Platelets 219 150 - 400 K/uL   Neutrophils Relative % 66 %   Neutro Abs 4.7 1.7 - 7.7 K/uL   Lymphocytes Relative 22 %   Lymphs Abs 1.6 0.7 - 4.0 K/uL   Monocytes Relative 10 %   Monocytes Absolute 0.7 0.1 - 1.0 K/uL   Eosinophils Relative 2 %   Eosinophils Absolute 0.1 0.0 - 0.7 K/uL   Basophils Relative 0 %   Basophils Absolute 0.0 0.0 - 0.1 K/uL  Troponin I  Result Value Ref Range   Troponin I <0.03 <0.03 ng/mL  Urinalysis, Routine w reflex microscopic  Result Value Ref Range   Color, Urine YELLOW YELLOW   APPearance CLEAR CLEAR   Specific Gravity, Urine 1.011 1.005 -  1.030   pH 8.0 5.0 - 8.0   Glucose, UA NEGATIVE NEGATIVE mg/dL   Hgb urine dipstick NEGATIVE NEGATIVE   Bilirubin Urine NEGATIVE NEGATIVE   Ketones, ur 15 (A) NEGATIVE mg/dL   Protein, ur NEGATIVE NEGATIVE mg/dL   Nitrite NEGATIVE NEGATIVE   Leukocytes, UA NEGATIVE NEGATIVE   No results found. EKG  EKG Interpretation  Date/Time:  Monday Nov 04 2016 11:54:22 EDT Ventricular Rate:  75 PR Interval:    QRS Duration: 162 QT Interval:  452 QTC Calculation: 505 R Axis:   -72 Text Interpretation:  Sinus rhythm RBBB and LAFB Left ventricular hypertrophy No significant change since last tracing Reconfirmed by Winfred Leeds  MD, Tifanny Dollens [phone]) on 11/04/2016 1:24:53 PM       Radiology No results found.  Procedures Procedures (including critical care time)  Medications Ordered in ED Medications  sodium chloride 0.9 % bolus 1,000 mL (not administered)  metoCLOPramide (REGLAN) injection 5 mg (not administered)     Initial Impression / Assessment and Plan / ED Course  I have reviewed the triage vital signs and the nursing notes.  Pertinent labs & imaging results that were available during my care of the patient were reviewed by me and considered in my medical decision making (see chart for details).     1:35 PM feels improved after treatment with intravenous hydration and with IV Reglan. He is able to drink without difficulties able to walk in the emergency department without weakness or lightheadedness. For the patient was mildly dehydrated upon presentation, improved on discharge. I've called Mr.Sanguier, PA and arrange an appointment for him to be seen in the office tomorrow at 8:30 AM. Plan : Encourage oral hydration. Prescription Reglan. He is instructed to  take his blood pressure medication upon arrival home today since he did not take it this morning. Blood pressure recheck tomorrow. Avoid dairy. Imodium as needed for diarrhea Oral potassium supplementation administered prior to  discharge Final Clinical Impressions(s) / ED Diagnoses  Diagnosis #1 nausea vomiting and diarrhea #2 generalized weakness #3 chronic leg pain #4 elevated blood pressure #5 hypokalemia Final diagnoses:  None    New Prescriptions New Prescriptions   No medications on file     Orlie Dakin, MD 11/04/16 1342

## 2016-11-04 NOTE — ED Notes (Signed)
Pt tolerated ginger ale. Walked with cane to bathroom and around ED without assistance and without difficulty

## 2016-11-04 NOTE — ED Triage Notes (Signed)
Pt reports he has had restless leg x 4 years. Also c/o nausea and "feel weak all over". States his left leg "gave out yesterday" and he fell

## 2016-11-04 NOTE — Discharge Instructions (Signed)
Take the medication prescribed today as needed for nausea. You can take Imodium as directed for diarrhea. Avoid milk or foods containing milk such as cheese or ice cream all having diarrhea.Make sure that you drink at least six 8 ounce glasses of water or Gatorade each day in order to stay well-hydrated. Take your blood pressure medication since she arrived home today. Today's blood pressure was elevated at 189/106.  An appointment has been scheduled for used for tomorrow at 8:30 AM with Mr.Sanguier. Make sure that you keep that appointment. Asked to have your blood pressure rechecked. Return if concern for any reason

## 2016-11-05 ENCOUNTER — Ambulatory Visit: Payer: Medicare Other | Admitting: Medical

## 2016-11-05 ENCOUNTER — Ambulatory Visit (INDEPENDENT_AMBULATORY_CARE_PROVIDER_SITE_OTHER): Payer: Medicare Other | Admitting: Medical

## 2016-11-05 VITALS — BP 119/74 | HR 77 | Temp 98.2°F | Resp 16 | Ht 68.0 in | Wt 175.4 lb

## 2016-11-05 DIAGNOSIS — G2581 Restless legs syndrome: Secondary | ICD-10-CM

## 2016-11-05 DIAGNOSIS — M792 Neuralgia and neuritis, unspecified: Secondary | ICD-10-CM | POA: Diagnosis not present

## 2016-11-05 MED ORDER — ROPINIROLE HCL 0.25 MG PO TABS: ORAL_TABLET | ORAL | 0 refills | Status: DC

## 2016-11-05 NOTE — Progress Notes (Signed)
Subjective:    Patient ID: Brandon Mason, male    DOB: December 02, 1925, 83 y.o.   MRN: [id number]  HPI   Pt in for follow up from ED.  Pt was determined to be mild dehydrated. Pt had mild low k yesterday found on work up in ED. 2 days before he went to ED had some diarrhea and vomiting. Then by time he left  ED some better. He feels better today. Since yesterday no diarrhea or vomiting.  Pt states restless leg syndrome is sometimes severe. He states legs jumping yesterday. He states neurontin caused him excess sedation but helps sometimes. Pt also is on tramadol. He did well with this for a while.   Pt in past had been on requip in the past. He was on neurontin before he saw me.(Dr. Carles Collet put him on neurontin for restless leg)  Pt states Dr. Carles Collet told him he had neuropathy.  Pt is not sure which medication is making him sleepy.  Pt states in past to help him with sedation/stimulate him one MD may have given him stimulant.     Pt describes dealing with restless leg syndrome stresses him out.   Review of Systems  Respiratory: Negative for apnea, cough, chest tightness, shortness of breath and wheezing.   Cardiovascular: Negative for chest pain.  Gastrointestinal: Negative for abdominal pain, diarrhea, nausea and vomiting.  Musculoskeletal: Negative for back pain, myalgias and neck stiffness.  Skin: Negative for rash.  Neurological:       Restless legs  Hematological: Negative for adenopathy. Does not bruise/bleed easily.  Psychiatric/Behavioral: Negative for behavioral problems and confusion.       Sedation at time during the day.   Past Medical History:  Diagnosis Date  . Abnormality of gait 02/09/2013  . Arthritis   . Cancer ([place])    basal cell skin cancer  . Cardiomegaly   . Chronic kidney disease    BPH  . Chronic leg pain   . Hypersomnia   . Hypertension   . Hypoxia    pulmonary  . Neuromuscular disorder (HCC)    restless leg syndrome   . Nocturia   . Prostate  disorder   . Restless legs syndrome (RLS) 02/09/2013  . Restless legs syndrome with nocturnal myoclonus   . RLS (restless legs syndrome)    x 40 years  . Shortness of breath    with exertion   . Sleep apnea    uses cpap  . Stroke [facility])    minor stroke in 2003 - no lasting side effects  . UARS (upper airway resistance syndrome) 06/28/2014     Social History   Social History  . Marital status: Married    Spouse name: [name]  . Number of children: 5  . Years of education: 12   Occupational History  . retired      [name] for a [employer]  . Smoking status: Never Smoker  . Smokeless tobacco: Never Used  . Alcohol use 1.2 oz/week    2 Cans of beer per week     Comment: weekly  . Drug use: No  . Sexual activity: Not on file   Other Topics Concern  . Not on file   Social History Narrative   Patient lives at home with his wife [name].   Patient is retired.    Patient has 5 children.   Patient has a high school education.   Patient is right-handed.  Patient drinks very little caffeine.    Past Surgical History:  Procedure Laterality Date  . ANTERIOR CERVICAL DECOMP/DISCECTOMY FUSION N/A 03/29/2016   Procedure: ANTERIOR CERVICAL DECOMPRESSION/DISCECTOMY FUSION CERVICAL THREE- CERVICAL FOUR;  Surgeon: Ashok Pall, MD;  Location: [place] [facility];  Service: Neurosurgery;  Laterality: N/A;  . BACK SURGERY  2003   L4-5  . CATARACT EXTRACTION Bilateral   . HERNIA REPAIR     inguinal hernia  . TRANSURETHRAL PROSTATECTOMY WITH GYRUS INSTRUMENTS  04/28/2012   Procedure: TRANSURETHRAL PROSTATECTOMY WITH GYRUS INSTRUMENTS;  Surgeon: Fredricka Bonine, MD;  Location[facility];  Service: Urology;  Laterality: N/A;  . TRANSURETHRAL RESECTION OF PROSTATE  03/24/2012   Procedure: TRANSURETHRAL RESECTION OF THE PROSTATE (TURP);  Surgeon: Fredricka Bonine, MD;  Location: [facility];  Service: Urology;  Laterality: N/A;  with gyrus ,Greenlight PVP laser  of Prostate    Family History  Problem Relation Age of Onset  . Restless legs syndrome Mother   . Restless legs syndrome Daughter   . Esophageal cancer Neg Hx   . Colon cancer Neg Hx   . Pancreatic cancer Neg Hx   . Stomach cancer Neg Hx     No Known Allergies  Current Outpatient Prescriptions on File Prior to Visit  Medication Sig Dispense Refill  . aspirin 325 MG tablet Take 325 mg by mouth daily.    . Calcium Carb-Cholecalciferol (CALCIUM 600 + D PO) Take 1 capsule by mouth daily.     . furosemide (LASIX) 40 MG tablet Take 40 mg by mouth daily.     Marland Kitchen gabapentin (NEURONTIN) 600 MG tablet Take 1 tablet (600 mg total) by mouth 3 (three) times daily. 270 tablet 1  . lisinopril (PRINIVIL,ZESTRIL) 20 MG tablet Take 1 tablet (20 mg total) by mouth daily. 90 tablet 0  . metoCLOPramide (REGLAN) 10 MG tablet Take 0.5 tablets (5 mg total) by mouth every 6 (six) hours as needed for nausea or vomiting (nausea/headache). 4 tablet 0  . traMADol (ULTRAM) 50 MG tablet Take 1 tablet (50 mg total) by mouth 2 (two) times daily. 60 tablet 0  . vitamin B-12 (CYANOCOBALAMIN) 1000 MCG tablet Take 1,000 mcg by mouth daily.     No current facility-administered medications on file prior to visit.     BP 119/74 (BP Location: Right Arm, Patient Position: Sitting, Cuff Size: Normal)   Pulse 77   Temp 98.2 F (36.8 C) (Oral)   Resp 16   Ht 5\' 8"  (1.727 m)   Wt 175 lb 6.4 oz (79.6 kg)   SpO2 94%   BMI 26.67 kg/m       Objective:   Physical Exam  General Mental Status- Alert. General Appearance- Not in acute distress.   Skin General: Color- Normal Color. Moisture- Normal Moisture.  Neck Carotid Arteries- Normal color. Moisture- Normal Moisture. No carotid bruits. No JVD.  Chest and Lung Exam Auscultation: Breath Sounds:-Normal.  Cardiovascular Auscultation:Rythm- Regular. Murmurs & Other Heart Sounds:Auscultation of the heart reveals- No Murmurs.  Abdomen Inspection:-Inspeection  Normal. Palpation/Percussion:Note:No mass. Palpation and Percussion of the abdomen reveal- Non Tender, Non Distended + BS, no rebound or guarding.   Neurologic Cranial Nerve exam:- CN III-XII intact(No nystagmus), symmetric smile. Strength:- 5/5 equal and symmetric strength both upper and lower extremities.     Assessment & Plan:  For restless legs will add requip to your regimen. Continue the tramadol. After one week of requip use you could reduce gabapentin to twice daily.(see if this reduces your  sedation).  Will go ahead and get referral to new neurologist in progress.  You GI symptoms appear to have resolved and you feel better after hydration. Your potassium was mild low recommend eat banana every other day. This would be way to keep potassium up. If you don't like bananas then could rx potassium tab to use every other day.  Follow up in 2-3 weeks or as needed  Serenna Deroy, Percell Miller, [employer]

## 2016-11-05 NOTE — Progress Notes (Signed)
Pre visit review using our clinic review tool, if applicable. No additional management support is needed unless otherwise documented below in the visit note. 

## 2016-11-05 NOTE — Patient Instructions (Addendum)
For restless legs will add requip to your regimen. Continue the tramadol. After one week of requip use you could reduce gabapentin to twice daily.(see if this reduces your sedation).  Will go ahead and get referral to new neurologist in progress.  You GI symptoms appear to have resolved and you feel better after hydration. Your potassium was mild low recommend eat banana every other day. This would be way to keep potassium up. If you don't like bananas then could rx potassium tab to use every other day.  Follow up in 2-3 weeks or as needed

## 2016-11-13 ENCOUNTER — Encounter: Payer: Self-pay | Admitting: Medical

## 2016-11-22 ENCOUNTER — Other Ambulatory Visit: Payer: Self-pay | Admitting: Medical

## 2016-11-27 ENCOUNTER — Encounter: Payer: Self-pay | Admitting: Medical

## 2016-11-28 ENCOUNTER — Telehealth: Payer: Self-pay | Admitting: Medical

## 2016-11-28 NOTE — Telephone Encounter (Signed)
Pt has restless leg syndrome(recently not well controlled). Let him know I want to increase requip instruction to 2 tab po tid(2 tabs three times daily). Clarify how many he is taking now . I believe he is just using 1 tab 3 times daily. Update [place] on Monday if this helps. If this does help I want to make his pharmacy aware that we have changed the sig(instruction).   Pt  number is [phone].He wants [place] to call him before the weekend.

## 2016-11-29 NOTE — Telephone Encounter (Addendum)
Pt notified of instructions and verbalized understanding.  He is currently taking 1 tablet TID and will increase to 2 tablets TID.  He will call or send MyChart message Monday w/ update.  If effective, new rx should be sent to [facility], [place] - [id number]

## 2016-12-02 ENCOUNTER — Telehealth: Payer: Self-pay | Admitting: Medical

## 2016-12-02 ENCOUNTER — Encounter: Payer: Self-pay | Admitting: Medical

## 2016-12-02 MED ORDER — ROPINIROLE HCL 0.25 MG PO TABS: ORAL_TABLET | ORAL | 1 refills | Status: DC

## 2016-12-02 MED ORDER — ROPINIROLE HCL 0.25 MG PO TABS: ORAL_TABLET | ORAL | 0 refills | Status: DC

## 2016-12-02 MED ORDER — TRAMADOL HCL 50 MG PO TABS: 50.0000 mg | ORAL_TABLET | Freq: Two times a day (BID) | ORAL | 0 refills | Status: DC

## 2016-12-02 NOTE — Telephone Encounter (Addendum)
Pt is taking Ropinirole twice a day 3 in the afternoon and 3 at bed time.    Refill Request: Tramadol   Last RX:10/23/16 Last OV:11/05/16 Next OV: None Scheduled  UDS:05/01/16 CSC:05/01/16

## 2016-12-02 NOTE — Telephone Encounter (Signed)
Opened to review 

## 2016-12-02 NOTE — Telephone Encounter (Signed)
Called pt requip in. Pt tramadol rx signed. You can send it to his pharmacy as long as no sooner than 30 days since last rx. I looked at his med list and looks like 10-23-2016 his last rx. Verify I am right about that and then can send in the tramadol

## 2016-12-03 NOTE — Telephone Encounter (Signed)
I called pt last night and sent in his rx to pharmacy.

## 2016-12-12 ENCOUNTER — Telehealth: Payer: Self-pay | Admitting: Medical

## 2016-12-12 NOTE — Telephone Encounter (Addendum)
Relation to QM:GNOI Call back number: [phone]   Reason for call:  Patient requesting referral to [place] [place] cardiolgist due legs being cold and no feeling in toes for the past several weeks, please advise seeking clinical advice

## 2016-12-13 ENCOUNTER — Ambulatory Visit (INDEPENDENT_AMBULATORY_CARE_PROVIDER_SITE_OTHER): Payer: Medicare Other | Admitting: Family

## 2016-12-13 ENCOUNTER — Encounter: Payer: Self-pay | Admitting: Family

## 2016-12-13 VITALS — BP 166/88 | HR 86 | Temp 98.5°F | Resp 16 | Ht 68.0 in | Wt 173.6 lb

## 2016-12-13 DIAGNOSIS — R202 Paresthesia of skin: Secondary | ICD-10-CM

## 2016-12-13 DIAGNOSIS — R200 Anesthesia of skin: Secondary | ICD-10-CM | POA: Diagnosis not present

## 2016-12-13 NOTE — Telephone Encounter (Signed)
I think that patient needs evaluation in the office today please.

## 2016-12-13 NOTE — Telephone Encounter (Signed)
Patient was scheduled with NP for today.

## 2016-12-13 NOTE — Patient Instructions (Signed)
Please complete lab work prior to leaving.   

## 2016-12-13 NOTE — Progress Notes (Signed)
Subjective:    Patient ID: Brandon Mason, male    DOB: September 29, 1925, 83 y.o.   MRN: [id number]  HPI   Brandon Mason is an 83 yr old male who presents today with chief complaint of numbness in bilateral feet. Reports feeling "like socks" in both legs.  Legs are numb "all of the time." Has been happening x 2 months. He feels like his legs are weak as well.  He denies low back pain.  He had hx of lumbar disc surgery in 2003.  He has also had anterior cervical decompression/discectomy. This was performed by Dr. b. Denies bowel/bladder incontinence.     Review of Systems See HPI  Past Medical History:  Diagnosis Date  . Abnormality of gait 02/09/2013  . Arthritis   . Cancer ([place])    basal cell skin cancer  . Cardiomegaly   . Chronic kidney disease    BPH  . Chronic leg pain   . Hypersomnia   . Hypertension   . Hypoxia    pulmonary  . Neuromuscular disorder (HCC)    restless leg syndrome   . Nocturia   . Prostate disorder   . Restless legs syndrome (RLS) 02/09/2013  . Restless legs syndrome with nocturnal myoclonus   . RLS (restless legs syndrome)    x 40 years  . Shortness of breath    with exertion   . Sleep apnea    uses cpap  . Stroke [facility])    minor stroke in 2003 - no lasting side effects  . UARS (upper airway resistance syndrome) 06/28/2014     Social History   Social History  . Marital status: Married    Spouse name: Brandon Mason  . Number of children: 5  . Years of education: 12   Occupational History  . retired      [name] for a [employer]  . Smoking status: Never Smoker  . Smokeless tobacco: Never Used  . Alcohol use 1.2 oz/week    2 Cans of beer per week     Comment: weekly  . Drug use: No  . Sexual activity: Not on file   Other Topics Concern  . Not on file   Social History Narrative   Patient lives at home with his wife Brandon Mason.   Patient is retired.    Patient has 5 children.   Patient has a high school education.   Patient is right-handed.   Patient drinks very little caffeine.    Past Surgical History:  Procedure Laterality Date  . ANTERIOR CERVICAL DECOMP/DISCECTOMY FUSION N/A 03/29/2016   Procedure: ANTERIOR CERVICAL DECOMPRESSION/DISCECTOMY FUSION CERVICAL THREE- CERVICAL FOUR;  Surgeon: Ashok Pall, MD;  Location: [place] [facility];  Service: Neurosurgery;  Laterality: N/A;  . BACK SURGERY  2003   L4-5  . CATARACT EXTRACTION Bilateral   . HERNIA REPAIR     inguinal hernia  . TRANSURETHRAL PROSTATECTOMY WITH GYRUS INSTRUMENTS  04/28/2012   Procedure: TRANSURETHRAL PROSTATECTOMY WITH GYRUS INSTRUMENTS;  Surgeon: Fredricka Bonine, MD;  [facility];  Service: Urology;  Laterality: N/A;  . TRANSURETHRAL RESECTION OF PROSTATE  03/24/2012   Procedure: TRANSURETHRAL RESECTION OF THE PROSTATE (TURP);  Surgeon: Fredricka Bonine, MD;  [facility];  Service: Urology;  Laterality: N/A;  with gyrus ,Greenlight PVP laser of Prostate    Family History  Problem Relation Age of Onset  . Restless legs syndrome Mother   . Restless legs syndrome Daughter   .  Esophageal cancer Neg Hx   . Colon cancer Neg Hx   . Pancreatic cancer Neg Hx   . Stomach cancer Neg Hx     No Known Allergies  Current Outpatient Prescriptions on File Prior to Visit  Medication Sig Dispense Refill  . aspirin 325 MG tablet Take 325 mg by mouth daily.    . Calcium Carb-Cholecalciferol (CALCIUM 600 + D PO) Take 1 capsule by mouth daily.     . furosemide (LASIX) 40 MG tablet Take 40 mg by mouth daily.     Marland Kitchen gabapentin (NEURONTIN) 600 MG tablet Take 1 tablet (600 mg total) by mouth 3 (three) times daily. 270 tablet 1  . lisinopril (PRINIVIL,ZESTRIL) 20 MG tablet TAKE 1 TABLET BY MOUTH ONCE DAILY 30 tablet 0  . metoCLOPramide (REGLAN) 10 MG tablet Take 0.5 tablets (5 mg total) by mouth every 6 (six) hours as needed for nausea or vomiting (nausea/headache). 4 tablet 0  . rOPINIRole (REQUIP) 0.25 MG tablet 3 tab po bid.  180 tablet 1  . traMADol (ULTRAM) 50 MG tablet Take 1 tablet (50 mg total) by mouth 2 (two) times daily. 60 tablet 0  . vitamin B-12 (CYANOCOBALAMIN) 1000 MCG tablet Take 1,000 mcg by mouth daily.     No current facility-administered medications on file prior to visit.     BP (!) 166/88 (BP Location: Right Arm, Cuff Size: Normal)   Pulse 86   Temp 98.5 F (36.9 C) (Oral)   Resp 16   Ht 5\' 8"  (1.727 m)   Wt 173 lb 9.6 oz (78.7 kg)   SpO2 96%   BMI 26.40 kg/m       Objective:   Physical Exam  Constitutional: He is oriented to person, place, and time. He appears well-developed and well-nourished. No distress.  HENT:  Head: Normocephalic and atraumatic.  Cardiovascular: Normal rate and regular rhythm.   No murmur heard. Pulses:      Dorsalis pedis pulses are 2+ on the right side, and 2+ on the left side.       Posterior tibial pulses are 2+ on the right side.  Bilateral UE/LE strength is 5/5  Pulmonary/Chest: Effort normal and breath sounds normal. No respiratory distress. He has no wheezes. He has no rales.  Musculoskeletal: He exhibits no edema.       Cervical back: He exhibits no tenderness.       Thoracic back: He exhibits no tenderness.       Lumbar back: He exhibits no tenderness.  Neurological: He is alert and oriented to person, place, and time.  Reflex Scores:      Tricep reflexes are 2+ on the right side and 2+ on the left side.      Patellar reflexes are 2+ on the right side and 2+ on the left side. Sensation intact to monofilament bilaterally, slightly diminished on left anterior foot.   Skin: Skin is warm and dry.  Psychiatric: He has a normal mood and affect. His behavior is normal. Thought content normal.          Assessment & Plan:  Leg numbness- suspect related to his lumbar disc disease. Obtain MRI Lumbar spine. Will check b12 and folate levels. Will refer back to his neurosurgeon for further evaluation. He is advised to go to the ER if worsening  symptoms or if bowel/bladder incontinence.

## 2016-12-14 LAB — FOLATE: Folate: 11.8 ng/mL (ref >5.4)

## 2016-12-14 LAB — VITAMIN B12: VITAMIN B 12: 1035 pg/mL (ref 200–1100)

## 2016-12-16 ENCOUNTER — Encounter: Payer: Self-pay | Admitting: Family

## 2016-12-21 ENCOUNTER — Ambulatory Visit (HOSPITAL_BASED_OUTPATIENT_CLINIC_OR_DEPARTMENT_OTHER)
Admission: RE | Admit: 2016-12-21 | Discharge: 2016-12-21 | Disposition: A | Discharge status: Home / Self Care | Payer: Medicare Other | Source: Ambulatory Visit | Attending: Family | Admitting: Family

## 2016-12-21 DIAGNOSIS — R200 Anesthesia of skin: Secondary | ICD-10-CM | POA: Diagnosis not present

## 2016-12-21 DIAGNOSIS — M48061 Spinal stenosis, lumbar region without neurogenic claudication: Secondary | ICD-10-CM | POA: Insufficient documentation

## 2016-12-21 DIAGNOSIS — M5136 Other intervertebral disc degeneration, lumbar region: Secondary | ICD-10-CM | POA: Insufficient documentation

## 2016-12-21 DIAGNOSIS — M545 Low back pain: Secondary | ICD-10-CM | POA: Diagnosis not present

## 2016-12-21 DIAGNOSIS — M5126 Other intervertebral disc displacement, lumbar region: Secondary | ICD-10-CM | POA: Insufficient documentation

## 2016-12-22 ENCOUNTER — Telehealth: Payer: Self-pay | Admitting: Family

## 2016-12-22 DIAGNOSIS — M519 Unspecified thoracic, thoracolumbar and lumbosacral intervertebral disc disorder: Secondary | ICD-10-CM

## 2016-12-23 NOTE — Telephone Encounter (Signed)
Brandon Mason-- this encounter came to me with nothing in it?  Is there something I need to discuss with pt?

## 2016-12-24 NOTE — Telephone Encounter (Signed)
Yes, please let pt know that his MRI shows advanced degenerative changes/arthritis in his spine. I would recommend that he see neurosurgery for further evaluation please.

## 2016-12-24 NOTE — Telephone Encounter (Signed)
Neurosurgery placed today. Left message on voicemail for pt to return my call.

## 2016-12-24 NOTE — Telephone Encounter (Signed)
Notified pt and he is agreeable to proceed with referral and prefers to see Ashok Pall. Referral note updated.

## 2016-12-26 ENCOUNTER — Encounter: Payer: Self-pay | Admitting: Medical

## 2016-12-26 ENCOUNTER — Ambulatory Visit (INDEPENDENT_AMBULATORY_CARE_PROVIDER_SITE_OTHER): Payer: Medicare Other | Admitting: Medical

## 2016-12-26 VITALS — BP 140/80 | HR 79 | Temp 98.1°F | Resp 14 | Ht 68.0 in | Wt 172.6 lb

## 2016-12-26 DIAGNOSIS — R1032 Left lower quadrant pain: Secondary | ICD-10-CM

## 2016-12-26 DIAGNOSIS — R319 Hematuria, unspecified: Secondary | ICD-10-CM

## 2016-12-26 LAB — POC URINALSYSI DIPSTICK (AUTOMATED)
Bilirubin, UA: NEGATIVE
GLUCOSE UA: NEGATIVE
Ketones, UA: NEGATIVE
Leukocytes, UA: NEGATIVE
NITRITE UA: NEGATIVE
PH UA: 6.5 (ref 5.0–8.0)
Spec Grav, UA: 1.015 (ref 1.010–1.025)
UROBILINOGEN UA: NEGATIVE U/dL — AB

## 2016-12-26 MED ORDER — METRONIDAZOLE 500 MG PO TABS: 500.0000 mg | ORAL_TABLET | Freq: Three times a day (TID) | ORAL | 0 refills | Status: DC

## 2016-12-26 MED ORDER — CIPROFLOXACIN HCL 250 MG PO TABS: 250.0000 mg | ORAL_TABLET | Freq: Two times a day (BID) | ORAL | 0 refills | Status: DC

## 2016-12-26 NOTE — Progress Notes (Signed)
Subjective:    Patient ID: Brandon Mason, male    DOB: 1926-05-05, 83 y.o.   MRN: [id number]  HPI  Pt in states all day yesterday and some today some pain in left lower quadrant. Pain was high yesterday at level of about 10/10 intitially but now pain is easing up. Yesterday pain was intermittent. Now pain level is about 1-2/10. No diarrhea. No constipation. No urinary pain. No new back pain but some history of mild low back pain. Pt denies any hx of diverticlosis or diverticutis in the past. Pt had colonoscopies but years since done.   No hx of kidney stones.  Pt had hernia 30 years ago and was repaired. No recurrent bulge in area.   NO pain urinating. No bloody urine.   Review of Systems  Constitutional: Negative for chills and fatigue.  Respiratory: Negative for cough, chest tightness, shortness of breath and wheezing.   Cardiovascular: Negative for chest pain and palpitations.  Gastrointestinal: Positive for abdominal pain. Negative for abdominal distention, blood in stool, constipation, diarrhea, nausea and vomiting.  Musculoskeletal: Negative for back pain.  Skin: Negative for rash.  Neurological: Negative for dizziness and headaches.  Hematological: Negative for adenopathy. Does not bruise/bleed easily.  Psychiatric/Behavioral: Negative for behavioral problems and confusion.   Past Medical History:  Diagnosis Date  . Abnormality of gait 02/09/2013  . Arthritis   . Cancer ([place])    basal cell skin cancer  . Cardiomegaly   . Chronic kidney disease    BPH  . Chronic leg pain   . Hypersomnia   . Hypertension   . Hypoxia    pulmonary  . Neuromuscular disorder (HCC)    restless leg syndrome   . Nocturia   . Prostate disorder   . Restless legs syndrome (RLS) 02/09/2013  . Restless legs syndrome with nocturnal myoclonus   . RLS (restless legs syndrome)    x 40 years  . Shortness of breath    with exertion   . Sleep apnea    uses cpap  . Stroke [facility])    minor stroke  in 2003 - no lasting side effects  . UARS (upper airway resistance syndrome) 06/28/2014     Social History   Social History  . Marital status: Married    Spouse name: [name]  . Number of children: 5  . Years of education: 12   Occupational History  . retired      [name] for a [employer]  . Smoking status: Never Smoker  . Smokeless tobacco: Never Used  . Alcohol use 1.2 oz/week    2 Cans of beer per week     Comment: weekly  . Drug use: No  . Sexual activity: Not on file   Other Topics Concern  . Not on file   Social History Narrative   Patient lives at home with his wife [name].   Patient is retired.    Patient has 5 children.   Patient has a high school education.   Patient is right-handed.   Patient drinks very little caffeine.    Past Surgical History:  Procedure Laterality Date  . ANTERIOR CERVICAL DECOMP/DISCECTOMY FUSION N/A 03/29/2016   Procedure: ANTERIOR CERVICAL DECOMPRESSION/DISCECTOMY FUSION CERVICAL THREE- CERVICAL FOUR;  Surgeon: Ashok Pall, MD;  Location: [place] [facility];  Service: Neurosurgery;  Laterality: N/A;  . BACK SURGERY  2003   L4-5  . CATARACT EXTRACTION Bilateral   . HERNIA REPAIR  inguinal hernia  . TRANSURETHRAL PROSTATECTOMY WITH GYRUS INSTRUMENTS  04/28/2012   Procedure: TRANSURETHRAL PROSTATECTOMY WITH GYRUS INSTRUMENTS;  Surgeon: Fredricka Bonine, MD;  [facility];  Service: Urology;  Laterality: N/A;  . TRANSURETHRAL RESECTION OF PROSTATE  03/24/2012   Procedure: TRANSURETHRAL RESECTION OF THE PROSTATE (TURP);  Surgeon: Fredricka Bonine, MD;  [facility];  Service: Urology;  Laterality: N/A;  with gyrus ,Greenlight PVP laser of Prostate    Family History  Problem Relation Age of Onset  . Restless legs syndrome Mother   . Restless legs syndrome Daughter   . Esophageal cancer Neg Hx   . Colon cancer Neg Hx   . Pancreatic cancer Neg Hx   . Stomach cancer Neg Hx     No  Known Allergies  Current Outpatient Prescriptions on File Prior to Visit  Medication Sig Dispense Refill  . aspirin 325 MG tablet Take 325 mg by mouth daily.    . Calcium Carb-Cholecalciferol (CALCIUM 600 + D PO) Take 1 capsule by mouth daily.     . furosemide (LASIX) 40 MG tablet Take 40 mg by mouth daily.     Marland Kitchen gabapentin (NEURONTIN) 600 MG tablet Take 1 tablet (600 mg total) by mouth 3 (three) times daily. 270 tablet 1  . lisinopril (PRINIVIL,ZESTRIL) 20 MG tablet TAKE 1 TABLET BY MOUTH ONCE DAILY 30 tablet 0  . metoCLOPramide (REGLAN) 10 MG tablet Take 0.5 tablets (5 mg total) by mouth every 6 (six) hours as needed for nausea or vomiting (nausea/headache). 4 tablet 0  . rOPINIRole (REQUIP) 0.25 MG tablet 3 tab po bid. 180 tablet 1  . traMADol (ULTRAM) 50 MG tablet Take 1 tablet (50 mg total) by mouth 2 (two) times daily. 60 tablet 0  . vitamin B-12 (CYANOCOBALAMIN) 1000 MCG tablet Take 1,000 mcg by mouth daily.     No current facility-administered medications on file prior to visit.     BP (!) 153/74 (BP Location: Left Arm, Patient Position: Sitting, Cuff Size: Normal)   Pulse 79   Temp 98.1 F (36.7 C) (Oral)   Resp 14   Ht 5\' 8"  (1.727 m)   Wt 172 lb 9.6 oz (78.3 kg)   SpO2 94%   BMI 26.24 kg/m       Objective:   Physical Exam  General Appearance- Not in acute distress.  HEENT Eyes- Scleraeral/Conjuntiva-bilat- Not Yellow. Mouth & Throat- Normal.  Chest and Lung Exam Auscultation: Breath sounds:-Normal. Adventitious sounds:- No Adventitious sounds.  Cardiovascular Auscultation:Rythm - Regular. Heart Sounds -Normal heart sounds.  Abdomen Inspection:-Inspection Normal.  Palpation/Perucssion: Palpation and Percussion of the abdomen reveal- mild  left lower quadrant when lying supine and mild tender but reports minimal to no pain in sitting position), No Rebound tenderness, No rigidity(Guarding) and No Palpable abdominal masses.  Liver:-Normal.  Spleen:- Normal.     Genital- no bulge in inguinal canal. But alight more tender on left side high up.(appears prior old hernia scar on left side)  Hip- no pain on palpation lt side. No pain on rom. No leg roll pain.        Assessment & Plan:  For your left lower quadrant pain will treat for potential diverticulitis. Rx flaygl and cipro but only 5 days. And cipro will be low dose.   Urine sample today and cbc lab.  Hydrate well. Low dose ibuprofen 200 mg every 8 hours for 2-3 days.(in event muscel pain compnent)  Follow up on Monday or as needed. If severe  pain again then ED evaluation.  On Monday if pain not resolved would plan to get ct abd/pelvis.  Cathrine Krizan, Percell Miller, [name]

## 2016-12-26 NOTE — Patient Instructions (Addendum)
For your left lower quadrant pain will treat for potential diverticulitis. Rx flaygl and cipro but only 5 days. And cipro will be low dose.   Urine sample today and cbc lab.  Hydrate well. Low dose ibuprofen 200 mg every 8 hours for 2-3 days.  Follow up on Monday or as needed. If severe pain again then ED evaluation.  On Monday if pain not resolved would plan to get ct abd/pelvis.

## 2016-12-27 LAB — CBC WITH DIFFERENTIAL/PLATELET
BASOS PCT: 0.8 % (ref 0.0–3.0)
Basophils Absolute: 0 10*3/uL (ref 0.0–0.1)
EOS PCT: 1.8 % (ref 0.0–5.0)
Eosinophils Absolute: 0.1 10*3/uL (ref 0.0–0.7)
HCT: 39 % (ref 39.0–52.0)
Hemoglobin: 13.2 g/dL (ref 13.0–17.0)
Lymphocytes Relative: 28.3 % (ref 12.0–46.0)
Lymphs Abs: 1.7 10*3/uL (ref 0.7–4.0)
MCHC: 33.9 g/dL (ref 30.0–36.0)
MCV: 100.1 fl — AB (ref 78.0–100.0)
MONO ABS: 0.6 10*3/uL (ref 0.1–1.0)
MONOS PCT: 10.8 % (ref 3.0–12.0)
Neutro Abs: 3.5 10*3/uL (ref 1.4–7.7)
Neutrophils Relative %: 58.3 % (ref 43.0–77.0)
Platelets: 245 10*3/uL (ref 150.0–400.0)
RBC: 3.9 Mil/uL — AB (ref 4.22–5.81)
RDW: 13.1 % (ref 11.5–15.5)
WBC: 6 10*3/uL (ref 4.0–10.5)

## 2016-12-27 LAB — COMPREHENSIVE METABOLIC PANEL
ALK PHOS: 50 U/L (ref 39–117)
ALT: 11 U/L (ref 0–53)
AST: 13 U/L (ref 0–37)
Albumin: 4.1 g/dL (ref 3.5–5.2)
BUN: 22 mg/dL (ref 6–23)
CALCIUM: 9.5 mg/dL (ref 8.4–10.5)
CHLORIDE: 105 meq/L (ref 96–112)
CO2: 32 mEq/L (ref 19–32)
Creatinine, Ser: 0.94 mg/dL (ref 0.40–1.50)
GFR: 80.4 mL/min (ref >60.00)
Glucose, Bld: 95 mg/dL (ref 70–99)
POTASSIUM: 3.9 meq/L (ref 3.5–5.1)
SODIUM: 142 meq/L (ref 135–145)
Total Bilirubin: 0.5 mg/dL (ref 0.2–1.2)
Total Protein: 7.1 g/dL (ref 6.0–8.3)

## 2016-12-27 LAB — URINE CULTURE: ORGANISM ID, BACTERIA: NO GROWTH

## 2016-12-28 ENCOUNTER — Other Ambulatory Visit: Payer: Self-pay | Admitting: Medical

## 2016-12-30 ENCOUNTER — Telehealth: Payer: Self-pay | Admitting: Medical

## 2016-12-30 ENCOUNTER — Ambulatory Visit: Payer: Medicare Other | Admitting: Medical

## 2016-12-30 DIAGNOSIS — R319 Hematuria, unspecified: Secondary | ICD-10-CM

## 2016-12-30 MED ORDER — CIPROFLOXACIN HCL 500 MG PO TABS: 500.0000 mg | ORAL_TABLET | Freq: Two times a day (BID) | ORAL | 0 refills | Status: DC

## 2016-12-30 MED ORDER — METRONIDAZOLE 500 MG PO TABS: 500.0000 mg | ORAL_TABLET | Freq: Three times a day (TID) | ORAL | 0 refills | Status: DC

## 2016-12-30 NOTE — Telephone Encounter (Signed)
Opened to review 

## 2016-12-30 NOTE — Telephone Encounter (Signed)
Patent notified. Will retunrn in 1 week for urine specimin.

## 2016-12-30 NOTE — Telephone Encounter (Signed)
Future order urine placed.

## 2016-12-30 NOTE — Telephone Encounter (Signed)
Pt called in at 8:17 to cancel his appt for today at 1:15p. He said that he no longer need it, he feel better.    He says that he was advised that provider is extending his medication to take it for 14 days instead of 5, pt would like to make sure the provider sent in enough to pharmacy for the 14 days.     Should pt be charged a no show fee

## 2016-12-30 NOTE — Telephone Encounter (Signed)
Let pt know that I sent in another 9 days of antibiotic. However if after using another 5 days and he feels no symptoms/pain whatsoever he can stop in 5 days which would be 10 days of total days of antibiotic since he has already been on about 5 days. But also remind pt that I want him to give urine sample on one week. Make sure blood in his urine on last check cleared up. I will put future urine order in.

## 2016-12-31 ENCOUNTER — Other Ambulatory Visit: Payer: Self-pay

## 2016-12-31 DIAGNOSIS — M4316 Spondylolisthesis, lumbar region: Secondary | ICD-10-CM | POA: Diagnosis not present

## 2016-12-31 DIAGNOSIS — R319 Hematuria, unspecified: Secondary | ICD-10-CM

## 2016-12-31 DIAGNOSIS — M4726 Other spondylosis with radiculopathy, lumbar region: Secondary | ICD-10-CM | POA: Diagnosis not present

## 2016-12-31 DIAGNOSIS — M5021 Other cervical disc displacement,  high cervical region: Secondary | ICD-10-CM | POA: Diagnosis not present

## 2017-01-06 ENCOUNTER — Other Ambulatory Visit (INDEPENDENT_AMBULATORY_CARE_PROVIDER_SITE_OTHER): Payer: Medicare Other

## 2017-01-06 DIAGNOSIS — R319 Hematuria, unspecified: Secondary | ICD-10-CM

## 2017-01-06 LAB — POC URINALSYSI DIPSTICK (AUTOMATED)
Bilirubin, UA: NEGATIVE
Blood, UA: NEGATIVE
Glucose, UA: NEGATIVE
Ketones, UA: NEGATIVE
Leukocytes, UA: NEGATIVE
Nitrite, UA: NEGATIVE
PROTEIN UA: NEGATIVE
Spec Grav, UA: 1.02 (ref 1.010–1.025)
UROBILINOGEN UA: 0.2 U/dL
pH, UA: 6.5 (ref 5.0–8.0)

## 2017-01-12 ENCOUNTER — Other Ambulatory Visit: Payer: Self-pay | Admitting: Medical

## 2017-01-17 ENCOUNTER — Other Ambulatory Visit: Payer: Self-pay | Admitting: Medical

## 2017-01-17 ENCOUNTER — Other Ambulatory Visit: Payer: Self-pay

## 2017-01-17 ENCOUNTER — Telehealth: Payer: Self-pay | Admitting: Medical

## 2017-01-17 MED ORDER — TRAMADOL HCL 50 MG PO TABS: 50.0000 mg | ORAL_TABLET | Freq: Two times a day (BID) | ORAL | 0 refills | Status: DC

## 2017-01-17 MED ORDER — TRAMADOL HCL 50 MG PO TABS: 50.0000 mg | ORAL_TABLET | Freq: Two times a day (BID) | ORAL | 2 refills | Status: DC

## 2017-01-17 NOTE — Progress Notes (Unsigned)
Refill Request Tramadol  Last RX:12/02/16 Last OV:12/26/16 Next JF:TNBZ schedule UDS:05/01/16 CSC:05/01/16

## 2017-01-17 NOTE — Telephone Encounter (Signed)
I just talked a pharmacist today that informed me can give refills of tramadol. So I tried to print but our printer not working. But it is docmented in epic. So would you call the med to his pharmacy(as I wrote with 2 refills. Also verify with the pharmacy I was told correctly. Then call patient.

## 2017-01-17 NOTE — Telephone Encounter (Signed)
October would be good time for check up and then get flu vaccine

## 2017-01-20 MED ORDER — LISINOPRIL 20 MG PO TABS
20.0000 mg | ORAL_TABLET | Freq: Every day | ORAL | 2 refills | Status: DC
Start: 2017-01-20 — End: 2017-06-26

## 2017-01-20 MED ORDER — TRAMADOL HCL 50 MG PO TABS: 50.0000 mg | ORAL_TABLET | Freq: Two times a day (BID) | ORAL | 2 refills | Status: DC

## 2017-01-20 NOTE — Telephone Encounter (Addendum)
Pt has been scheduled.   Pt also would like to follow up on refill request.

## 2017-01-20 NOTE — Telephone Encounter (Signed)
Pt called in to follow up on refill request. (Tramadol)  Pt says that he is completely out.    Pt says that he also need a refill on lisinopril    Pharmacy: [facility], [place] - [id number]

## 2017-01-20 NOTE — Telephone Encounter (Signed)
Could you call and schedule follow up

## 2017-01-20 NOTE — Telephone Encounter (Signed)
Rx faxed to pharmacy  

## 2017-01-20 NOTE — Addendum Note (Signed)
Addended by: Hinton Dyer on: 01/20/2017 01:05 PM   Modules accepted: Orders

## 2017-02-03 ENCOUNTER — Telehealth: Payer: Self-pay | Admitting: Medical

## 2017-02-03 MED ORDER — ROPINIROLE HCL 0.25 MG PO TABS: ORAL_TABLET | ORAL | 1 refills | Status: DC

## 2017-02-03 NOTE — Telephone Encounter (Signed)
Caller name:Aloysuis Trenton Gammon Relationship to patient: Can be [phone] [place], [place]   Reason for call:requesting refill of ropinirole 0.25mg 

## 2017-02-03 NOTE — Telephone Encounter (Signed)
Rx sent to pharmacy   

## 2017-02-06 ENCOUNTER — Other Ambulatory Visit: Payer: Self-pay

## 2017-02-06 MED ORDER — ROPINIROLE HCL 0.25 MG PO TABS: ORAL_TABLET | ORAL | 3 refills | Status: DC

## 2017-02-06 NOTE — Progress Notes (Signed)
Ropinirole reordered with 3 refills pt notified.

## 2017-02-26 ENCOUNTER — Telehealth: Payer: Self-pay | Admitting: Medical

## 2017-02-26 DIAGNOSIS — M4726 Other spondylosis with radiculopathy, lumbar region: Secondary | ICD-10-CM | POA: Diagnosis not present

## 2017-02-26 NOTE — Telephone Encounter (Signed)
Relation to EX:MDYJ Call back number:[phone] Pharmacy: [facility], [place] - [address] [phone] (Fax)     Reason for call:  Patient requesting 90 day supply gabapentin (NEURONTIN) 600 MG tablet, please advise

## 2017-02-27 ENCOUNTER — Other Ambulatory Visit: Payer: Self-pay

## 2017-02-27 MED ORDER — GABAPENTIN 600 MG PO TABS: 600.0000 mg | ORAL_TABLET | Freq: Three times a day (TID) | ORAL | 1 refills | Status: DC

## 2017-02-28 DIAGNOSIS — M4726 Other spondylosis with radiculopathy, lumbar region: Secondary | ICD-10-CM | POA: Diagnosis not present

## 2017-02-28 DIAGNOSIS — M5416 Radiculopathy, lumbar region: Secondary | ICD-10-CM | POA: Diagnosis not present

## 2017-03-25 DIAGNOSIS — M4726 Other spondylosis with radiculopathy, lumbar region: Secondary | ICD-10-CM | POA: Diagnosis not present

## 2017-03-25 DIAGNOSIS — M5416 Radiculopathy, lumbar region: Secondary | ICD-10-CM | POA: Diagnosis not present

## 2017-04-16 ENCOUNTER — Encounter: Payer: Self-pay | Admitting: Medical

## 2017-04-16 ENCOUNTER — Ambulatory Visit (INDEPENDENT_AMBULATORY_CARE_PROVIDER_SITE_OTHER): Payer: Medicare Other | Admitting: Medical

## 2017-04-16 VITALS — BP 170/88 | HR 65 | Temp 98.5°F | Resp 16 | Ht 68.0 in | Wt 172.0 lb

## 2017-04-16 DIAGNOSIS — G2581 Restless legs syndrome: Secondary | ICD-10-CM

## 2017-04-16 DIAGNOSIS — I10 Essential (primary) hypertension: Secondary | ICD-10-CM

## 2017-04-16 DIAGNOSIS — Z23 Encounter for immunization: Secondary | ICD-10-CM | POA: Diagnosis not present

## 2017-04-16 DIAGNOSIS — R7303 Prediabetes: Secondary | ICD-10-CM

## 2017-04-16 LAB — LIPID PANEL
CHOL/HDL RATIO: 3
Cholesterol: 154 mg/dL (ref 0–200)
HDL: 55.1 mg/dL (ref >39.00)
LDL Cholesterol: 86 mg/dL (ref 0–99)
NONHDL: 98.41
Triglycerides: 64 mg/dL (ref 0.0–149.0)
VLDL: 12.8 mg/dL (ref 0.0–40.0)

## 2017-04-16 LAB — COMPREHENSIVE METABOLIC PANEL
ALK PHOS: 48 U/L (ref 39–117)
ALT: 9 U/L (ref 0–53)
AST: 10 U/L (ref 0–37)
Albumin: 3.7 g/dL (ref 3.5–5.2)
BUN: 26 mg/dL — AB (ref 6–23)
CALCIUM: 9.2 mg/dL (ref 8.4–10.5)
CHLORIDE: 107 meq/L (ref 96–112)
CO2: 31 mEq/L (ref 19–32)
Creatinine, Ser: 0.86 mg/dL (ref 0.40–1.50)
GFR: 89.02 mL/min (ref >60.00)
Glucose, Bld: 97 mg/dL (ref 70–99)
POTASSIUM: 4.6 meq/L (ref 3.5–5.1)
Sodium: 143 mEq/L (ref 135–145)
TOTAL PROTEIN: 6.4 g/dL (ref 6.0–8.3)
Total Bilirubin: 0.7 mg/dL (ref 0.2–1.2)

## 2017-04-16 LAB — HEMOGLOBIN A1C: Hgb A1c MFr Bld: 6 % (ref 4.6–6.5)

## 2017-04-16 MED ORDER — TRAMADOL HCL 50 MG PO TABS: 50.0000 mg | ORAL_TABLET | Freq: Two times a day (BID) | ORAL | 2 refills | Status: DC

## 2017-04-16 MED ORDER — ROPINIROLE HCL ER 2 MG PO TB24: 2.0000 mg | ORAL_TABLET | Freq: Every day | ORAL | 3 refills | Status: DC

## 2017-04-16 MED ORDER — AMLODIPINE BESYLATE 10 MG PO TABS: 10.0000 mg | ORAL_TABLET | Freq: Every day | ORAL | 2 refills | Status: DC

## 2017-04-16 NOTE — Patient Instructions (Addendum)
Your blood pressure is high today and you also confirm high blood pressures at home. Though your blood pressures are better at home. I do think it is best for you to continue the lisinopril at the same current dose but I am going to add amlodipine to your blood pressure regimen. I do think you start to see blood pressure reduction in 2-3 days. If with elevated blood pressures you have any cardiac or neurologic signs/symptoms then be seen at ED. I want you to schedule a nurse blood pressure check in one week to confirm blood pressures are improving. I would ask that on day of blood pressure check let them know I want them to check manually.  For your restless leg syndrome I am writing you a 2 mg dose extended release version of Requip. Also refilling her tramadol but postdating prescription to be filled on April 22, 2017.  We'll get lipid panel, CMP and A1c today.  You got flu vaccine today. Follow-up in one week nurse blood pressure check or as needed.

## 2017-04-16 NOTE — Progress Notes (Signed)
Subjective:    Patient ID: Brandon Mason, male    DOB: 1925/08/21, 83 y.o.   MRN: [id number]  HPI  Pt states his bp at home are 150-160/80-90 over past week. But no cardiac or neurologic signs or symptoms. No syncope or presyncope.   Pt last visit bp was 140/80.  Pt states Tuesday night he had rough night with his restless leg syndrome. Sometimes 3 nights a week has difficulty. Helps in conjucntion with tramadol but he wants more simplified regimen   Will get flu vaccine today.   Review of Systems  Constitutional: Negative for chills, fatigue and fever.  HENT: Negative for dental problem.   Respiratory: Negative for cough, chest tightness and wheezing.   Cardiovascular: Negative for chest pain and palpitations.  Gastrointestinal: Negative for abdominal pain, anal bleeding, constipation, diarrhea and vomiting.  Genitourinary: Negative for dysuria.  Musculoskeletal: Negative for back pain and neck pain.  Skin: Negative for rash.  Neurological: Negative for dizziness, seizures, speech difficulty, weakness and light-headedness.       See history of present illness  Hematological: Negative for adenopathy. Does not bruise/bleed easily.  Psychiatric/Behavioral: Negative for behavioral problems and confusion.    Past Medical History:  Diagnosis Date  . Abnormality of gait 02/09/2013  . Arthritis   . Cancer ([place])    basal cell skin cancer  . Cardiomegaly   . Chronic kidney disease    BPH  . Chronic leg pain   . Hypersomnia   . Hypertension   . Hypoxia    pulmonary  . Neuromuscular disorder (HCC)    restless leg syndrome   . Nocturia   . Prostate disorder   . Restless legs syndrome (RLS) 02/09/2013  . Restless legs syndrome with nocturnal myoclonus   . RLS (restless legs syndrome)    x 40 years  . Shortness of breath    with exertion   . Sleep apnea    uses cpap  . Stroke [facility])    minor stroke in 2003 - no lasting side effects  . UARS (upper airway resistance  syndrome) 06/28/2014     Social History   Social History  . Marital status: Married    Spouse name: [name]  . Number of children: 5  . Years of education: 12   Occupational History  . retired      [name] for a [employer]  . Smoking status: Never Smoker  . Smokeless tobacco: Never Used  . Alcohol use 1.2 oz/week    2 Cans of beer per week     Comment: weekly  . Drug use: No  . Sexual activity: Not on file   Other Topics Concern  . Not on file   Social History Narrative   Patient lives at home with his wife [name].   Patient is retired.    Patient has 5 children.   Patient has a high school education.   Patient is right-handed.   Patient drinks very little caffeine.    Past Surgical History:  Procedure Laterality Date  . ANTERIOR CERVICAL DECOMP/DISCECTOMY FUSION N/A 03/29/2016   Procedure: ANTERIOR CERVICAL DECOMPRESSION/DISCECTOMY FUSION CERVICAL THREE- CERVICAL FOUR;  Surgeon: Ashok Pall, MD;  Location: [facility];  Service: Neurosurgery;  Laterality: N/A;  . BACK SURGERY  2003   L4-5  . CATARACT EXTRACTION Bilateral   . HERNIA REPAIR     inguinal hernia  . TRANSURETHRAL PROSTATECTOMY WITH GYRUS INSTRUMENTS  04/28/2012  Procedure: TRANSURETHRAL PROSTATECTOMY WITH GYRUS INSTRUMENTS;  Surgeon: Fredricka Bonine, MD;  Location: [facility];  Service: Urology;  Laterality: N/A;  . TRANSURETHRAL RESECTION OF PROSTATE  03/24/2012   Procedure: TRANSURETHRAL RESECTION OF THE PROSTATE (TURP);  Surgeon: Fredricka Bonine, MD;  Location: [facility];  Service: Urology;  Laterality: N/A;  with gyrus ,Greenlight PVP laser of Prostate    Family History  Problem Relation Age of Onset  . Restless legs syndrome Mother   . Restless legs syndrome Daughter   . Esophageal cancer Neg Hx   . Colon cancer Neg Hx   . Pancreatic cancer Neg Hx   . Stomach cancer Neg Hx     No Known Allergies  Current Outpatient Prescriptions on File Prior to  Visit  Medication Sig Dispense Refill  . aspirin 325 MG tablet Take 325 mg by mouth daily.    . Calcium Carb-Cholecalciferol (CALCIUM 600 + D PO) Take 1 capsule by mouth daily.     . furosemide (LASIX) 40 MG tablet Take 40 mg by mouth daily.     Marland Kitchen gabapentin (NEURONTIN) 600 MG tablet Take 1 tablet (600 mg total) by mouth 3 (three) times daily. 270 tablet 1  . lisinopril (PRINIVIL,ZESTRIL) 20 MG tablet Take 1 tablet (20 mg total) by mouth daily. 30 tablet 2  . metoCLOPramide (REGLAN) 10 MG tablet Take 0.5 tablets (5 mg total) by mouth every 6 (six) hours as needed for nausea or vomiting (nausea/headache). 4 tablet 0  . metroNIDAZOLE (FLAGYL) 500 MG tablet Take 1 tablet (500 mg total) by mouth 3 (three) times daily. 27 tablet 0  . rOPINIRole (REQUIP) 0.25 MG tablet 3 tab po bid. 180 tablet 3  . traMADol (ULTRAM) 50 MG tablet Take 1 tablet (50 mg total) by mouth 2 (two) times daily. 60 tablet 2  . vitamin B-12 (CYANOCOBALAMIN) 1000 MCG tablet Take 1,000 mcg by mouth daily.     No current facility-administered medications on file prior to visit.     BP (!) 182/87   Pulse 65   Temp 98.5 F (36.9 C) (Oral)   Resp 16   Ht 5\' 8"  (1.727 m)   Wt 172 lb (78 kg)   SpO2 97%   BMI 26.15 kg/m       Objective:   Physical Exam  General Mental Status- Alert. General Appearance- Not in acute distress.   Skin General: Color- Normal Color. Moisture- Normal Moisture.  Neck Carotid Arteries- Normal color. Moisture- Normal Moisture. No carotid bruits. No JVD.  Chest and Lung Exam Auscultation: Breath Sounds:-Normal.  Cardiovascular Auscultation:Rythm- Regular. Murmurs & Other Heart Sounds:Auscultation of the heart reveals- No Murmurs.  Abdomen Inspection:-Inspeection Normal. Palpation/Percussion:Note:No mass. Palpation and Percussion of the abdomen reveal- Non Tender, Non Distended + BS, no rebound or guarding.   Neurologic Cranial Nerve exam:- CN III-XII intact(No nystagmus),  symmetric smile. Drift Test:- No drift. Finger to Nose:- Normal/Intact Strength:- 5/5 equal and symmetric strength both upper and lower extremities.      Assessment & Plan:  Your blood pressure is high today and you also confirm high blood pressures at home. Though your blood pressures are better at home. I do think it is best for you to continue the lisinopril at the same current dose but I am going to add amlodipine to your blood pressure regimen. I do think you start to see blood pressure reduction in 2-3 days. If with elevated blood pressures you have any cardiac or neurologic signs/symptoms then be seen at  ED. I want you to schedule a nurse blood pressure check in one week to confirm blood pressures are improving. I would ask that on day of blood pressure check let them know I want them to check manually.  For your restless leg syndrome I am writing you a 2 mg dose extended release version of Requip. Also refilling her tramadol but postdating prescription to be filled on April 22, 2017.  We'll get lipid panel, CMP and A1c today.  You got flu vaccine today. Follow-up in one week nurse blood pressure check or as needed.

## 2017-04-22 ENCOUNTER — Other Ambulatory Visit: Payer: Self-pay

## 2017-04-22 MED ORDER — ROPINIROLE HCL 0.25 MG PO TABS: ORAL_TABLET | ORAL | 1 refills | Status: DC

## 2017-04-23 ENCOUNTER — Ambulatory Visit (INDEPENDENT_AMBULATORY_CARE_PROVIDER_SITE_OTHER): Payer: Medicare Other | Admitting: Medical

## 2017-04-23 ENCOUNTER — Encounter: Payer: Self-pay | Admitting: Medical

## 2017-04-23 VITALS — BP 140/82 | HR 84 | Temp 98.3°F | Resp 16 | Ht 68.0 in | Wt 173.0 lb

## 2017-04-23 DIAGNOSIS — G2581 Restless legs syndrome: Secondary | ICD-10-CM

## 2017-04-23 DIAGNOSIS — I10 Essential (primary) hypertension: Secondary | ICD-10-CM | POA: Diagnosis not present

## 2017-04-23 MED ORDER — ROPINIROLE HCL ER 2 MG PO TB24: 2.0000 mg | ORAL_TABLET | Freq: Every day | ORAL | 0 refills | Status: DC

## 2017-04-23 MED FILL — rOPINIRole HCL ER 2 MG TB24: 2 | 30 days supply | Qty: 30 | Fill #0

## 2017-04-23 NOTE — Patient Instructions (Addendum)
Your blood pressure is better today 140/70 than prior reading and better than your at home readings.  I would continue the same regimen.  If you are at home readings are not less than 140/90 would want to reconfirm blood pressure reading here before refilling the amlodipine.  Also if we need to recheck your blood pressure before next refill would ask that you bring your blood pressure cuff to compare to our manual reading.  I did call downstairs and per pharmacist the Requip extended release form would cost $13.  We can try that for the next month and increase dose if needed.  Follow-up 3-4 weeks or as needed.

## 2017-04-23 NOTE — Progress Notes (Signed)
Subjective:    Patient ID: Brandon Mason, male    DOB: May 27, 1926, 83 y.o.   MRN: [id number]  HPI  Pt bp readings at home since I saw him here have been 155/80. Some diastolics in the 70 range. Pt started amlodipine 10 mg on last visit.   No headaches. No chest pain or dizziness. No gross motor or sensory function deficits.  Patient still expresses that his restless legs are bothering him mostly at night.  The extended release requip that I wrote him was not covered by his insurance and a cash price at [employer] was $80.  He still would like to try something different or find cheaper price.   Review of Systems  Constitutional: Negative for chills, fatigue and fever.  Respiratory: Negative for cough, chest tightness, shortness of breath and wheezing.   Cardiovascular: Negative for chest pain and palpitations.  Neurological: Negative for dizziness, weakness, numbness and headaches.       Restless leg syndrome.  Psychiatric/Behavioral: Negative for behavioral problems and confusion.    Past Medical History:  Diagnosis Date  . Abnormality of gait 02/09/2013  . Arthritis   . Cancer ([place])    basal cell skin cancer  . Cardiomegaly   . Chronic kidney disease    BPH  . Chronic leg pain   . Hypersomnia   . Hypertension   . Hypoxia    pulmonary  . Neuromuscular disorder (HCC)    restless leg syndrome   . Nocturia   . Prostate disorder   . Restless legs syndrome (RLS) 02/09/2013  . Restless legs syndrome with nocturnal myoclonus   . RLS (restless legs syndrome)    x 40 years  . Shortness of breath    with exertion   . Sleep apnea    uses cpap  . Stroke [facility])    minor stroke in 2003 - no lasting side effects  . UARS (upper airway resistance syndrome) 06/28/2014     Social History   Social History  . Marital status: Married    Spouse name: [name]  . Number of children: 5  . Years of education: 12   Occupational History  . retired      [name] for a [employer]  . Smoking status: Never Smoker  . Smokeless tobacco: Never Used  . Alcohol use 1.2 oz/week    2 Cans of beer per week     Comment: weekly  . Drug use: No  . Sexual activity: Not on file   Other Topics Concern  . Not on file   Social History Narrative   Patient lives at home with his wife [name].   Patient is retired.    Patient has 5 children.   Patient has a high school education.   Patient is right-handed.   Patient drinks very little caffeine.    Past Surgical History:  Procedure Laterality Date  . ANTERIOR CERVICAL DECOMP/DISCECTOMY FUSION N/A 03/29/2016   Procedure: ANTERIOR CERVICAL DECOMPRESSION/DISCECTOMY FUSION CERVICAL THREE- CERVICAL FOUR;  Surgeon: Ashok Pall, MD;  Location: [facility];  Service: Neurosurgery;  Laterality: N/A;  . BACK SURGERY  2003   L4-5  . CATARACT EXTRACTION Bilateral   . HERNIA REPAIR     inguinal hernia  . TRANSURETHRAL PROSTATECTOMY WITH GYRUS INSTRUMENTS  04/28/2012   Procedure: TRANSURETHRAL PROSTATECTOMY WITH GYRUS INSTRUMENTS;  Surgeon: Fredricka Bonine, MD;  Location: [facility];  Service: Urology;  Laterality: N/A;  . TRANSURETHRAL  RESECTION OF PROSTATE  03/24/2012   Procedure: TRANSURETHRAL RESECTION OF THE PROSTATE (TURP);  Surgeon: Fredricka Bonine, MD;  Location: [facility];  Service: Urology;  Laterality: N/A;  with gyrus ,Greenlight PVP laser of Prostate    Family History  Problem Relation Age of Onset  . Restless legs syndrome Mother   . Restless legs syndrome Daughter   . Esophageal cancer Neg Hx   . Colon cancer Neg Hx   . Pancreatic cancer Neg Hx   . Stomach cancer Neg Hx     No Known Allergies  Current Outpatient Prescriptions on File Prior to Visit  Medication Sig Dispense Refill  . amLODipine (NORVASC) 10 MG tablet Take 1 tablet (10 mg total) by mouth daily. 30 tablet 2  . aspirin 325 MG tablet Take 325 mg by mouth daily.    . Calcium Carb-Cholecalciferol (CALCIUM 600 + D  PO) Take 1 capsule by mouth daily.     . furosemide (LASIX) 40 MG tablet Take 40 mg by mouth daily.     Marland Kitchen gabapentin (NEURONTIN) 600 MG tablet Take 1 tablet (600 mg total) by mouth 3 (three) times daily. 270 tablet 1  . lisinopril (PRINIVIL,ZESTRIL) 20 MG tablet Take 1 tablet (20 mg total) by mouth daily. 30 tablet 2  . metoCLOPramide (REGLAN) 10 MG tablet Take 0.5 tablets (5 mg total) by mouth every 6 (six) hours as needed for nausea or vomiting (nausea/headache). 4 tablet 0  . metroNIDAZOLE (FLAGYL) 500 MG tablet Take 1 tablet (500 mg total) by mouth 3 (three) times daily. 27 tablet 0  . rOPINIRole (REQUIP) 0.25 MG tablet 3 tab po bid. 180 tablet 1  . traMADol (ULTRAM) 50 MG tablet Take 1 tablet (50 mg total) by mouth 2 (two) times daily. Can fill on April 22, 2017 60 tablet 2  . vitamin B-12 (CYANOCOBALAMIN) 1000 MCG tablet Take 1,000 mcg by mouth daily.     No current facility-administered medications on file prior to visit.     BP 140/82   Pulse 84   Temp 98.3 F (36.8 C) (Oral)   Resp 16   Ht 5\' 8"  (1.727 m)   Wt 173 lb (78.5 kg)   SpO2 94%   BMI 26.30 kg/m       Objective:   Physical Exam   General Mental Status- Alert. General Appearance- Not in acute distress.   Skin General: Color- Normal Color. Moisture- Normal Moisture.  Neck Carotid Arteries- Normal color. Moisture- Normal Moisture. No carotid bruits. No JVD.  Chest and Lung Exam Auscultation: Breath Sounds:-Normal.  Cardiovascular Auscultation:Rythm- Regular. Murmurs & Other Heart Sounds:Auscultation of the heart reveals- No Murmurs.    Neurologic Cranial Nerve exam:- CN III-XII intact(No nystagmus), symmetric smile. Strength:- 5/5 equal and symmetric strength both upper and lower extremities.     Assessment & Plan:  Your blood pressure is better today than prior reading and better than your at home readings.  I would continue the same regimen.  If you are at home readings are not less than  140/90 would want to reconfirm blood pressure reading here before refilling the amlodipine.  Also if we need to recheck your blood pressure before next refill would ask that you bring your blood pressure cuff to compare to our manual reading.  I did call downstairs and per pharmacist the Requip extended release form would cost $13.  We can try that for the next month and increase dose if needed.  Follow-up 3-4 weeks or as needed.

## 2017-05-08 NOTE — Progress Notes (Signed)
I agree with the assessment and plan as directed by DO .The patient is known to me .   Briar Sword, MD

## 2017-05-15 ENCOUNTER — Ambulatory Visit: Payer: Medicare Other | Admitting: Vascular Surgery

## 2017-05-15 ENCOUNTER — Encounter (HOSPITAL_COMMUNITY): Payer: Medicare Other

## 2017-06-10 ENCOUNTER — Ambulatory Visit: Payer: Medicare Other | Admitting: Medical

## 2017-06-20 ENCOUNTER — Encounter: Payer: Self-pay | Admitting: Medical

## 2017-06-20 ENCOUNTER — Ambulatory Visit (INDEPENDENT_AMBULATORY_CARE_PROVIDER_SITE_OTHER): Payer: Medicare Other | Admitting: Medical

## 2017-06-20 VITALS — BP 134/85 | HR 85 | Temp 98.2°F | Resp 16 | Ht 68.0 in | Wt 180.8 lb

## 2017-06-20 DIAGNOSIS — G2581 Restless legs syndrome: Secondary | ICD-10-CM

## 2017-06-20 MED ORDER — ROPINIROLE HCL ER 4 MG PO TB24: 4.0000 mg | ORAL_TABLET | Freq: Every day | ORAL | 3 refills | Status: DC

## 2017-06-20 NOTE — Patient Instructions (Signed)
For your severe restless leg symptoms, we increased your Requip to 4 mg at night.  Continue your gabapentin and tramadol.   I think this will help.  If you feel like this is not helping please notify us and at that point might consider referral to second neurologist to get their opinion.  Also might consider low-dose benzodiazepine.  Follow-up in 2-3 weeks or as needed.

## 2017-06-20 NOTE — Progress Notes (Signed)
Subjective:    Patient ID: Brandon Mason, male    DOB: [birth year], 83 y.o.   MRN: [id number]  HPI  Pt in for evaluation.  He is very frustrated with sensation of restless leg. Pt states las couple of weeks he has constant sensation of restless leg. From when he awakens until noon feels ok. But  early afternoon restless leg symptoms worsen until he falls asleep. Then often wakes up 30 minutes after he sleeps with restless sensation. He feels like he needs to pace and he states his legs will jump/move by themselves at times. No back pain on review.  Pt is on high dose gabapentin, requip, and he is on tramadol.   Pt did see [facility] neurologist in past for restless leg and he states they never found solution.    Review of Systems  Constitutional: Negative for chills, diaphoresis and fatigue.  Respiratory: Negative for cough, chest tightness, shortness of breath and stridor.   Cardiovascular: Negative for chest pain and palpitations.  Gastrointestinal: Negative for abdominal distention, blood in stool, diarrhea, nausea and vomiting.  Musculoskeletal: Negative for back pain, joint swelling and neck stiffness.  Skin: Negative for color change and rash.  Neurological: Negative for dizziness, seizures, syncope, weakness and headaches.       Restless legs.  Hematological: Negative for adenopathy. Does not bruise/bleed easily.  Psychiatric/Behavioral: Negative for behavioral problems and confusion.     Past Medical History:  Diagnosis Date  . Abnormality of gait 02/09/2013  . Arthritis   . Cancer ([place])    basal cell skin cancer  . Cardiomegaly   . Chronic kidney disease    BPH  . Chronic leg pain   . Hypersomnia   . Hypertension   . Hypoxia    pulmonary  . Neuromuscular disorder (HCC)    restless leg syndrome   . Nocturia   . Prostate disorder   . Restless legs syndrome (RLS) 02/09/2013  . Restless legs syndrome with nocturnal myoclonus   . RLS (restless legs syndrome)    x  40 years  . Shortness of breath    with exertion   . Sleep apnea    uses cpap  . Stroke [facility])    minor stroke in 2003 - no lasting side effects  . UARS (upper airway resistance syndrome) 06/28/2014     Social History   Socioeconomic History  . Marital status: Married    Spouse name: [name]  . Number of children: 5  . Years of education: 45  . Highest education level: Not on file  Social Needs  . Financial resource strain: Not on file  . Food insecurity - worry: Not on file  . Food insecurity - inability: Not on file  . Transportation needs - medical: Not on file  . Transportation needs - non-medical: Not on file  Occupational History  . 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 Smoking status: Never Smoker  . Smokeless tobacco: Never Used  Substance and Sexual Activity  . Alcohol use: Yes    Alcohol/week: 1.2 oz    Types: 2 Cans of beer per week    Comment: weekly  . Drug use: No  . Sexual activity: Not on file  Other Topics Concern  . Not on file  Social History Narrative   Patient lives at home with his wife [name].   Patient is retired.    Patient has 5 children.  Patient has a high school education.   Patient is right-handed.   Patient drinks very little caffeine.    Past Surgical History:  Procedure Laterality Date  . ANTERIOR CERVICAL DECOMP/DISCECTOMY FUSION N/A 03/29/2016   Procedure: ANTERIOR CERVICAL DECOMPRESSION/DISCECTOMY FUSION CERVICAL THREE- CERVICAL FOUR;  Surgeon: Ashok Pall, MD;  Location: [place] [facility];  Service: Neurosurgery;  Laterality: N/A;  . BACK SURGERY  2003   L4-5  . CATARACT EXTRACTION Bilateral   . HERNIA REPAIR     inguinal hernia  . TRANSURETHRAL PROSTATECTOMY WITH GYRUS INSTRUMENTS  04/28/2012   Procedure: TRANSURETHRAL PROSTATECTOMY WITH GYRUS INSTRUMENTS;  Surgeon: Fredricka Bonine, MD;  Location: [facility];  Service: Urology;  Laterality: N/A;  . TRANSURETHRAL RESECTION OF PROSTATE   03/24/2012   Procedure: TRANSURETHRAL RESECTION OF THE PROSTATE (TURP);  Surgeon: Fredricka Bonine, MD;  Location: [facility];  Service: Urology;  Laterality: N/A;  with gyrus ,Greenlight PVP laser of Prostate    Family History  Problem Relation Age of Onset  . Restless legs syndrome Mother   . Restless legs syndrome Daughter   . Esophageal cancer Neg Hx   . Colon cancer Neg Hx   . Pancreatic cancer Neg Hx   . Stomach cancer Neg Hx     No Known Allergies  Current Outpatient Medications on File Prior to Visit  Medication Sig Dispense Refill  . amLODipine (NORVASC) 10 MG tablet Take 1 tablet (10 mg total) by mouth daily. 30 tablet 2  . aspirin 325 MG tablet Take 325 mg by mouth daily.    . Calcium Carb-Cholecalciferol (CALCIUM 600 + D PO) Take 1 capsule by mouth daily.     . furosemide (LASIX) 40 MG tablet Take 40 mg by mouth daily.     Marland Kitchen gabapentin (NEURONTIN) 600 MG tablet Take 1 tablet (600 mg total) by mouth 3 (three) times daily. 270 tablet 1  . lisinopril (PRINIVIL,ZESTRIL) 20 MG tablet Take 1 tablet (20 mg total) by mouth daily. 30 tablet 2  . metoCLOPramide (REGLAN) 10 MG tablet Take 0.5 tablets (5 mg total) by mouth every 6 (six) hours as needed for nausea or vomiting (nausea/headache). 4 tablet 0  . metroNIDAZOLE (FLAGYL) 500 MG tablet Take 1 tablet (500 mg total) by mouth 3 (three) times daily. 27 tablet 0  . rOPINIRole (REQUIP XL) 2 MG 24 hr tablet Take 1 tablet (2 mg total) by mouth at bedtime. 30 tablet 0  . traMADol (ULTRAM) 50 MG tablet Take 1 tablet (50 mg total) by mouth 2 (two) times daily. Can fill on April 22, 2017 60 tablet 2  . vitamin B-12 (CYANOCOBALAMIN) 1000 MCG tablet Take 1,000 mcg by mouth daily.     No current facility-administered medications on file prior to visit.     BP 134/85   Pulse 85   Temp 98.2 F (36.8 C) (Oral)   Resp 16   Ht 5\' 8"  (1.727 m)   Wt 180 lb 12.8 oz (82 kg)   SpO2 96%   BMI 27.49 kg/m       Objective:   Physical  Exam  General Appearance- Not in acute distress.    Chest and Lung Exam Auscultation: Breath sounds:-Normal. Clear even and unlabored. Adventitious sounds:- No Adventitious sounds.  Cardiovascular Auscultation:Rythm - Regular, rate and rythm. Heart Sounds -Normal heart sounds.  Abdomen Inspection:-Inspection Normal.  Palpation/Perucssion: Palpation and Percussion of the abdomen reveal- Non Tender, No Rebound tenderness, No rigidity(Guarding) and No Palpable abdominal masses.  Liver:-Normal.  Spleen:-  Normal.   Back Mid lumbar spine tenderness to palpation. Pain on straight leg lift. Pain on lateral movements and flexion/extension of the spine.  Lower ext neurologic  L5-S1 sensation intact bilaterally. Normal patellar reflexes bilaterally. No foot drop bilaterally. Patient demonstrates that when he sits for 30 seconds to 60 seconds or so will have spontaneous jump/spasm of both legs.      Assessment & Plan:  For your severe restless leg symptoms, we increased your Requip to 4 mg at night.  Continue your gabapentin and tramadol.   I think this will help.  If you feel like this is not helping please notify us and at that point might consider referral to second neurologist to get their opinion.  Also might consider low-dose benzodiazepine.(Discussed with pharmacist possible use of Valium)  Follow-up in 2-3 weeks or as needed.  Skai Lickteig, Percell Miller, [name]

## 2017-06-23 ENCOUNTER — Telehealth: Payer: Self-pay

## 2017-06-23 NOTE — Telephone Encounter (Signed)
PA initiated 06/20/2017 via Covermymeds; KEY: [id number]. Received PA denial.  Request Reference Number: [id number]. ROPINIROLE TAB 4MG  ER is denied for not meeting the prior authorization requirement(s). For further questions, call [phone]. Appeals are not supported through [place]. Please refer to the fax case notice for appeals information and instructions.   PA denied d/t ropinirole use is not supported by FDA; dx: restless leg syndrome  Please advise.

## 2017-06-23 NOTE — Telephone Encounter (Signed)
I need some help with this. He is already on 2 mg and they have been covering. Need to know what can be done. Can someone make call for me.

## 2017-06-26 ENCOUNTER — Other Ambulatory Visit: Payer: Self-pay

## 2017-06-26 MED ORDER — LISINOPRIL 20 MG PO TABS: 20.0000 mg | ORAL_TABLET | Freq: Every day | ORAL | 1 refills | Status: DC

## 2017-06-26 NOTE — Telephone Encounter (Signed)
Will discuss with pt price of meds/cash cost when he calls or comes in. $29 dollars for requip sounds somewhat reasonable.   He does use tramadol as well for restless legs.

## 2017-06-26 NOTE — Telephone Encounter (Signed)
That is likely a problem. What was cost for him before. Did he get the prescription fill the other day? Does he have any more of prior 2 mg?

## 2017-06-26 NOTE — Telephone Encounter (Signed)
$  29 per 30 tablets-did not pick up.

## 2017-06-26 NOTE — Telephone Encounter (Signed)
The cash price is about $80

## 2017-06-26 NOTE — Telephone Encounter (Signed)
Would you call to our pharmacy at [facility]. Ask what generic cost of requip  4 mg extended release might cost patient. I sent him down the other day to pick up rx. Did he fill? How expensive is cash cost.

## 2017-06-26 NOTE — Telephone Encounter (Signed)
Brandon Mason has only FDA indications for parkinson's and Medicare will not cover since it is not listed as FDA indicated for restless leg syndrome.

## 2017-07-02 ENCOUNTER — Encounter: Payer: Self-pay | Admitting: Medical

## 2017-07-03 ENCOUNTER — Telehealth: Payer: Self-pay | Admitting: Medical

## 2017-07-03 NOTE — Telephone Encounter (Signed)
Notified pt. Pt states he will go pick up medication.,

## 2017-07-03 NOTE — Telephone Encounter (Signed)
I asked my medical assistant to call patient and inform him of the cost of Requip.  After she explained the price to him he thought that he could pay that amount.  In light of his medical history and the severity of his restless legs I do think this is the best option.  He is already on tramadol.  I was considering possible low-dose Valium at night but would prescribe this as a last resort.

## 2017-07-03 NOTE — Telephone Encounter (Signed)
[name], can you assist with the no show charge for snow day? Thank you.

## 2017-07-14 ENCOUNTER — Encounter: Payer: Self-pay | Admitting: Medical

## 2017-07-14 ENCOUNTER — Ambulatory Visit (INDEPENDENT_AMBULATORY_CARE_PROVIDER_SITE_OTHER): Payer: Medicare Other | Admitting: Medical

## 2017-07-14 ENCOUNTER — Ambulatory Visit (HOSPITAL_BASED_OUTPATIENT_CLINIC_OR_DEPARTMENT_OTHER)
Admission: RE | Admit: 2017-07-14 | Discharge: 2017-07-14 | Disposition: A | Discharge status: Home / Self Care | Payer: Medicare Other | Source: Ambulatory Visit | Attending: Medical | Admitting: Medical

## 2017-07-14 VITALS — BP 170/86 | HR 70 | Temp 97.8°F | Resp 16 | Ht 68.0 in | Wt 179.4 lb

## 2017-07-14 DIAGNOSIS — R600 Localized edema: Secondary | ICD-10-CM | POA: Insufficient documentation

## 2017-07-14 DIAGNOSIS — R350 Frequency of micturition: Secondary | ICD-10-CM | POA: Diagnosis not present

## 2017-07-14 DIAGNOSIS — G629 Polyneuropathy, unspecified: Secondary | ICD-10-CM | POA: Diagnosis not present

## 2017-07-14 DIAGNOSIS — J984 Other disorders of lung: Secondary | ICD-10-CM | POA: Insufficient documentation

## 2017-07-14 DIAGNOSIS — R8290 Unspecified abnormal findings in urine: Secondary | ICD-10-CM

## 2017-07-14 DIAGNOSIS — R0600 Dyspnea, unspecified: Secondary | ICD-10-CM | POA: Diagnosis not present

## 2017-07-14 DIAGNOSIS — I10 Essential (primary) hypertension: Secondary | ICD-10-CM | POA: Diagnosis not present

## 2017-07-14 DIAGNOSIS — J9811 Atelectasis: Secondary | ICD-10-CM | POA: Diagnosis not present

## 2017-07-14 DIAGNOSIS — R918 Other nonspecific abnormal finding of lung field: Secondary | ICD-10-CM | POA: Diagnosis not present

## 2017-07-14 LAB — POC URINALSYSI DIPSTICK (AUTOMATED)
Bilirubin, UA: NEGATIVE
Blood, UA: NEGATIVE
Glucose, UA: NEGATIVE
Ketones, UA: NEGATIVE
Leukocytes, UA: NEGATIVE
NITRITE UA: NEGATIVE
PROTEIN UA: NEGATIVE
Spec Grav, UA: >=1.03 — AB (ref 1.010–1.025)
UROBILINOGEN UA: NEGATIVE U/dL — AB
pH, UA: 6 (ref 5.0–8.0)

## 2017-07-14 LAB — PSA: PSA: 2.33 ng/mL (ref 0.10–4.00)

## 2017-07-14 LAB — BRAIN NATRIURETIC PEPTIDE: Pro B Natriuretic peptide (BNP): 27 pg/mL (ref 0.0–100.0)

## 2017-07-14 NOTE — Progress Notes (Signed)
Subjective:    Patient ID: Brandon Mason, male    DOB: 1926-06-22, 84 y.o.   MRN: [id number]  HPI   Pt in for a follow up.   Pt did get requip at higher dose. He had to pay cash but it is controlling his symptoms. Much less symptoms with higher dose. I last saw him on June 20, 2017.  Pt has noticed some lower extremity swelling for th past month. Some burning sensation. Pt weight has been over past year. No new shortness all the sudden but does not some past 2 years. Pt has been on amlodipine for a couple of years.   Pt has htn. He is on lisinopril, lasix and amlodipine. He did not take medications yet. He usually take around noon.  Pt states legs look little better in morning. Does get worse during the day.        Review of Systems  Constitutional: Negative for chills, fatigue and fever.  HENT: Negative for congestion, drooling, ear pain, facial swelling, hearing loss, mouth sores, postnasal drip, rhinorrhea and sinus pain.   Respiratory: Positive for shortness of breath. Negative for cough, chest tightness and wheezing.        Some past 2 years but no change acutley.  Cardiovascular: Negative for chest pain and palpitations.  Gastrointestinal: Negative for abdominal distention, abdominal pain and anal bleeding.  Genitourinary: Positive for frequency. Negative for penile swelling and urgency.       Bad odor to urine at times.  Musculoskeletal: Negative for back pain, myalgias and neck pain.  Skin: Negative for rash.  Neurological:       Restless leg syndrome.  Hematological: Negative for adenopathy. Does not bruise/bleed easily.  Psychiatric/Behavioral: Negative for behavioral problems, confusion and self-injury. The patient is not nervous/anxious.    Past Medical History:  Diagnosis Date  . Abnormality of gait 02/09/2013  . Arthritis   . Cancer ([place])    basal cell skin cancer  . Cardiomegaly   . Chronic kidney disease    BPH  . Chronic leg pain   .  Hypersomnia   . Hypertension   . Hypoxia    pulmonary  . Neuromuscular disorder (HCC)    restless leg syndrome   . Nocturia   . Prostate disorder   . Restless legs syndrome (RLS) 02/09/2013  . Restless legs syndrome with nocturnal myoclonus   . RLS (restless legs syndrome)    x 40 years  . Shortness of breath    with exertion   . Sleep apnea    uses cpap  . Stroke [facility])    minor stroke in 2003 - no lasting side effects  . UARS (upper airway resistance syndrome) 06/28/2014     Social History   Socioeconomic History  . Marital status: Married    Spouse name: [name]  . Number of children: 5  . Years of education: 60  . Highest education level: Not on file  Social Needs  . Financial resource strain: Not on file  . Food insecurity - worry: Not on file  . Food insecurity - inability: Not on file  . Transportation needs - medical: Not on file  . Transportation needs - non-medical: Not on file  Occupational History  . 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 Smoking status: Never Smoker  . Smokeless tobacco: Never Used  Substance and Sexual Activity  . Alcohol use: Yes    Alcohol/week:  1.2 oz    Types: 2 Cans of beer per week    Comment: weekly  . Drug use: No  . Sexual activity: Not on file  Other Topics Concern  . Not on file  Social History Narrative   Patient lives at home with his wife [name].   Patient is retired.    Patient has 5 children.   Patient has a high school education.   Patient is right-handed.   Patient drinks very little caffeine.    Past Surgical History:  Procedure Laterality Date  . ANTERIOR CERVICAL DECOMP/DISCECTOMY FUSION N/A 03/29/2016   Procedure: ANTERIOR CERVICAL DECOMPRESSION/DISCECTOMY FUSION CERVICAL THREE- CERVICAL FOUR;  Surgeon: Ashok Pall, MD;  Location: [place] [facility];  Service: Neurosurgery;  Laterality: N/A;  . BACK SURGERY  2003   L4-5  . CATARACT EXTRACTION Bilateral   . HERNIA REPAIR       inguinal hernia  . TRANSURETHRAL PROSTATECTOMY WITH GYRUS INSTRUMENTS  04/28/2012   Procedure: TRANSURETHRAL PROSTATECTOMY WITH GYRUS INSTRUMENTS;  Surgeon: Fredricka Bonine, MD;  Location: [facility];  Service: Urology;  Laterality: N/A;  . TRANSURETHRAL RESECTION OF PROSTATE  03/24/2012   Procedure: TRANSURETHRAL RESECTION OF THE PROSTATE (TURP);  Surgeon: Fredricka Bonine, MD;  Location: [facility];  Service: Urology;  Laterality: N/A;  with gyrus ,Greenlight PVP laser of Prostate    Family History  Problem Relation Age of Onset  . Restless legs syndrome Mother   . Restless legs syndrome Daughter   . Esophageal cancer Neg Hx   . Colon cancer Neg Hx   . Pancreatic cancer Neg Hx   . Stomach cancer Neg Hx     No Known Allergies  Current Outpatient Medications on File Prior to Visit  Medication Sig Dispense Refill  . amLODipine (NORVASC) 10 MG tablet Take 1 tablet (10 mg total) by mouth daily. 30 tablet 2  . aspirin 325 MG tablet Take 325 mg by mouth daily.    . Calcium Carb-Cholecalciferol (CALCIUM 600 + D PO) Take 1 capsule by mouth daily.     . furosemide (LASIX) 40 MG tablet Take 40 mg by mouth daily.     Marland Kitchen gabapentin (NEURONTIN) 600 MG tablet Take 1 tablet (600 mg total) by mouth 3 (three) times daily. 270 tablet 1  . lisinopril (PRINIVIL,ZESTRIL) 20 MG tablet Take 1 tablet (20 mg total) by mouth daily. 90 tablet 1  . metoCLOPramide (REGLAN) 10 MG tablet Take 0.5 tablets (5 mg total) by mouth every 6 (six) hours as needed for nausea or vomiting (nausea/headache). 4 tablet 0  . metroNIDAZOLE (FLAGYL) 500 MG tablet Take 1 tablet (500 mg total) by mouth 3 (three) times daily. 27 tablet 0  . rOPINIRole (REQUIP XL) 4 MG 24 hr tablet Take 1 tablet (4 mg total) by mouth at bedtime. 30 tablet 3  . traMADol (ULTRAM) 50 MG tablet Take 1 tablet (50 mg total) by mouth 2 (two) times daily. Can fill on April 22, 2017 60 tablet 2  . vitamin B-12 (CYANOCOBALAMIN) 1000 MCG tablet Take  1,000 mcg by mouth daily.     No current facility-administered medications on file prior to visit.     BP (!) 170/86   Pulse 70   Temp 97.8 F (36.6 C) (Oral)   Resp 16   Ht 5\' 8"  (1.727 m)   Wt 179 lb 6.4 oz (81.4 kg)   SpO2 98%   BMI 27.28 kg/m       Objective:   Physical Exam  General Appearance- Not in acute distress.  HEENT Eyes- Scleraeral/Conjuntiva-bilat- Not Yellow. Mouth & Throat- Normal.  Chest and Lung Exam Auscultation: Breath sounds:-Normal. Adventitious sounds:- No Adventitious sounds.  Cardiovascular Auscultation:Rythm - Regular. Heart Sounds -Normal heart sounds.  Abdomen Inspection:-Inspection Normal.  Palpation/Perucssion: Palpation and Percussion of the abdomen reveal- Non Tender, No Rebound tenderness, No rigidity(Guarding) and No Palpable abdominal masses.  Liver:-Normal.  Spleen:- Normal.    Back- no cva tenderness. No mid lumbar spine tenderness.  Lower extremities-calves appear symmetric in size.  1+ pedal edema bilaterally.  Negative Homans sign.      Assessment & Plan:  Your edema in the lower extremity is likely edema but we need to make sure not present serious condition such as CHF.  So we will get chest x-ray today, CMP and BNP.  If no CHF findings found then would recommend elevate your legs more and your compression stockings daily.  If any asymmetric swelling of the legs occur please let us know.  Also if any pain behind the knees occur let us know as well.  Your blood pressure is elevated presently but your about 2 hours behind in taking your daily dose.  I do want you to take your blood pressure medications daily and check your blood pressure daily as well.  If your blood pressure does not come down to less than 140/90 then will need to make BP medication adjustments.  If you have any cardiac or neurologic signs or symptoms then be seen in the ED.  Your restless leg syndrome symptoms are improved recently with increased dosage  of Requip.  Can you that.  If either you your restless leg worsen or worsen then might consider adding low-dose Valium at night only.  Presently recommend continuing tramadol.  For frequent urination and bad odor, I am getting a urine culture and PSA.  Follow-up in 10-14 days or as needed.  Regarding your blood pressure please call us tomorrow and update me on your blood pressure readings.  Or you could my chart me as well.  [place], Percell Miller, [name]

## 2017-07-14 NOTE — Patient Instructions (Addendum)
Your edema in the lower extremity is likely edema but we need to make sure not present serious condition such as CHF.  So we will get chest x-ray today, CMP and BNP.  If no CHF findings found then would recommend elevate your legs more and your compression stockings daily.  If any asymmetric swelling of the legs occur please let us know.  Also if any pain behind the knees occur let us know as well.  Your blood pressure is elevated presently but your about 2 hours behind in taking your daily dose.  I do want you to take your blood pressure medications daily and check your blood pressure daily as well.  If your blood pressure does not come down to less than 140/90 then will need to make BP medication adjustments.  If you have any cardiac or neurologic signs or symptoms then be seen in the ED.  Your restless leg syndrome symptoms are improved recently with increased dosage of Requip.  Can you that.  If either you your restless leg worsen or worsen then might consider adding low-dose Valium at night only.  Presently recommend continuing tramadol.  For frequent urination and bad odor, I am getting a urine culture and PSA.  Follow-up in 10-14 days or as needed.  Regarding your blood pressure please call us tomorrow and update me on your blood pressure readings.  Or you could my chart me as well.

## 2017-07-15 ENCOUNTER — Other Ambulatory Visit (INDEPENDENT_AMBULATORY_CARE_PROVIDER_SITE_OTHER): Payer: Medicare Other

## 2017-07-15 DIAGNOSIS — R600 Localized edema: Secondary | ICD-10-CM | POA: Diagnosis not present

## 2017-07-15 DIAGNOSIS — I10 Essential (primary) hypertension: Secondary | ICD-10-CM | POA: Diagnosis not present

## 2017-07-15 LAB — COMPREHENSIVE METABOLIC PANEL
ALBUMIN: 3.9 g/dL (ref 3.5–5.2)
ALT: 8 U/L (ref 0–53)
AST: 11 U/L (ref 0–37)
Alkaline Phosphatase: 58 U/L (ref 39–117)
BUN: 23 mg/dL (ref 6–23)
CALCIUM: 9.4 mg/dL (ref 8.4–10.5)
CHLORIDE: 105 meq/L (ref 96–112)
CO2: 32 mEq/L (ref 19–32)
CREATININE: 0.89 mg/dL (ref 0.40–1.50)
GFR: 85.52 mL/min (ref >60.00)
Glucose, Bld: 94 mg/dL (ref 70–99)
Potassium: 5.1 mEq/L (ref 3.5–5.1)
Sodium: 143 mEq/L (ref 135–145)
Total Bilirubin: 0.5 mg/dL (ref 0.2–1.2)
Total Protein: 6.5 g/dL (ref 6.0–8.3)

## 2017-07-15 LAB — URINE CULTURE
MICRO NUMBER: 90053923
Result:: NO GROWTH
SPECIMEN QUALITY: ADEQUATE

## 2017-07-30 ENCOUNTER — Encounter: Payer: Self-pay | Admitting: Medical

## 2017-07-30 ENCOUNTER — Ambulatory Visit (HOSPITAL_BASED_OUTPATIENT_CLINIC_OR_DEPARTMENT_OTHER)
Admission: RE | Admit: 2017-07-30 | Discharge: 2017-07-30 | Disposition: A | Discharge status: Home / Self Care | Payer: Medicare Other | Source: Ambulatory Visit | Attending: Medical | Admitting: Medical

## 2017-07-30 ENCOUNTER — Ambulatory Visit (INDEPENDENT_AMBULATORY_CARE_PROVIDER_SITE_OTHER): Payer: Medicare Other | Admitting: Medical

## 2017-07-30 ENCOUNTER — Telehealth: Payer: Self-pay | Admitting: Medical

## 2017-07-30 VITALS — BP 148/80 | HR 88 | Temp 98.2°F | Resp 16 | Ht 68.0 in | Wt 178.6 lb

## 2017-07-30 DIAGNOSIS — M25561 Pain in right knee: Secondary | ICD-10-CM | POA: Diagnosis not present

## 2017-07-30 DIAGNOSIS — I82441 Acute embolism and thrombosis of right tibial vein: Secondary | ICD-10-CM | POA: Insufficient documentation

## 2017-07-30 DIAGNOSIS — I82491 Acute embolism and thrombosis of other specified deep vein of right lower extremity: Secondary | ICD-10-CM

## 2017-07-30 DIAGNOSIS — R600 Localized edema: Secondary | ICD-10-CM | POA: Diagnosis not present

## 2017-07-30 DIAGNOSIS — M7989 Other specified soft tissue disorders: Secondary | ICD-10-CM | POA: Insufficient documentation

## 2017-07-30 DIAGNOSIS — M79609 Pain in unspecified limb: Secondary | ICD-10-CM | POA: Diagnosis not present

## 2017-07-30 DIAGNOSIS — M25461 Effusion, right knee: Secondary | ICD-10-CM | POA: Diagnosis not present

## 2017-07-30 DIAGNOSIS — M11261 Other chondrocalcinosis, right knee: Secondary | ICD-10-CM | POA: Insufficient documentation

## 2017-07-30 MED ORDER — RIVAROXABAN (XARELTO) VTE STARTER PACK (15 & 20 MG): ORAL_TABLET | ORAL | 0 refills | Status: DC

## 2017-07-30 MED FILL — XARELTO STARTER PACK: 15 & 20 | 28 days supply | Qty: 51 | Fill #0

## 2017-07-30 NOTE — Progress Notes (Signed)
Subjective:    Patient ID: Brandon Mason, male    DOB: 1926-06-16, 84 y.o.   MRN: [id number]  HPI  Pt in with some rt knee pain. States pain come on 2 weeks ago. No trauma or fall. Faint paint sitting at rest. But when he stands up and walks pain 6-7/10.  When he first gets up pain is worse. Sometimes pain seems to ease up if he walks on.    Review of Systems  Constitutional: Negative for chills, fatigue and fever.  Respiratory: Negative for cough, chest tightness, shortness of breath and wheezing.   Cardiovascular: Negative for chest pain.  Genitourinary: Negative for enuresis and flank pain.  Musculoskeletal: Negative for back pain, joint swelling, myalgias and neck stiffness.       Rt knee pain and some pain behind knee/lower leg.  Skin: Negative for rash.  Neurological: Negative for dizziness, speech difficulty, weakness, numbness and headaches.  Hematological: Negative for adenopathy. Does not bruise/bleed easily.  Psychiatric/Behavioral: Negative for behavioral problems and confusion.   Past Medical History:  Diagnosis Date  . Abnormality of gait 02/09/2013  . Arthritis   . Cancer ([place])    basal cell skin cancer  . Cardiomegaly   . Chronic kidney disease    BPH  . Chronic leg pain   . Hypersomnia   . Hypertension   . Hypoxia    pulmonary  . Neuromuscular disorder (HCC)    restless leg syndrome   . Nocturia   . Prostate disorder   . Restless legs syndrome (RLS) 02/09/2013  . Restless legs syndrome with nocturnal myoclonus   . RLS (restless legs syndrome)    x 40 years  . Shortness of breath    with exertion   . Sleep apnea    uses cpap  . Stroke [facility])    minor stroke in 2003 - no lasting side effects  . UARS (upper airway resistance syndrome) 06/28/2014     Social History   Socioeconomic History  . Marital status: Married    Spouse name: [name]  . Number of children: 5  . Years of education: 32  . Highest education level: Not on file  Social Needs    . Financial resource strain: Not on file  . Food insecurity - worry: Not on file  . Food insecurity - inability: Not on file  . Transportation needs - medical: Not on file  . Transportation needs - non-medical: Not on file  Occupational History  . 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 Smoking status: Never Smoker  . Smokeless tobacco: Never Used  Substance and Sexual Activity  . Alcohol use: Yes    Alcohol/week: 1.2 oz    Types: 2 Cans of beer per week    Comment: weekly  . Drug use: No  . Sexual activity: Not on file  Other Topics Concern  . Not on file  Social History Narrative   Patient lives at home with his wife [name].   Patient is retired.    Patient has 5 children.   Patient has a high school education.   Patient is right-handed.   Patient drinks very little caffeine.    Past Surgical History:  Procedure Laterality Date  . ANTERIOR CERVICAL DECOMP/DISCECTOMY FUSION N/A 03/29/2016   Procedure: ANTERIOR CERVICAL DECOMPRESSION/DISCECTOMY FUSION CERVICAL THREE- CERVICAL FOUR;  Surgeon: Ashok Pall, MD;  Location: [place] [facility];  Service: Neurosurgery;  Laterality: N/A;  .  BACK SURGERY  2003   L4-5  . CATARACT EXTRACTION Bilateral   . HERNIA REPAIR     inguinal hernia  . TRANSURETHRAL PROSTATECTOMY WITH GYRUS INSTRUMENTS  04/28/2012   Procedure: TRANSURETHRAL PROSTATECTOMY WITH GYRUS INSTRUMENTS;  Surgeon: Fredricka Bonine, MD;  Location: [facility];  Service: Urology;  Laterality: N/A;  . TRANSURETHRAL RESECTION OF PROSTATE  03/24/2012   Procedure: TRANSURETHRAL RESECTION OF THE PROSTATE (TURP);  Surgeon: Fredricka Bonine, MD;  Location: [facility];  Service: Urology;  Laterality: N/A;  with gyrus ,Greenlight PVP laser of Prostate    Family History  Problem Relation Age of Onset  . Restless legs syndrome Mother   . Restless legs syndrome Daughter   . Esophageal cancer Neg Hx   . Colon cancer Neg Hx   . Pancreatic  cancer Neg Hx   . Stomach cancer Neg Hx     No Known Allergies  Current Outpatient Medications on File Prior to Visit  Medication Sig Dispense Refill  . aspirin 325 MG tablet Take 325 mg by mouth daily.    Marland Kitchen gabapentin (NEURONTIN) 600 MG tablet Take 1 tablet (600 mg total) by mouth 3 (three) times daily. 270 tablet 1  . lisinopril (PRINIVIL,ZESTRIL) 20 MG tablet Take 1 tablet (20 mg total) by mouth daily. 90 tablet 1  . rOPINIRole (REQUIP XL) 4 MG 24 hr tablet Take 1 tablet (4 mg total) by mouth at bedtime. (Patient taking differently: Take 2 mg by mouth at bedtime. ) 30 tablet 3  . traMADol (ULTRAM) 50 MG tablet Take 1 tablet (50 mg total) by mouth 2 (two) times daily. Can fill on April 22, 2017 60 tablet 2  . amLODipine (NORVASC) 10 MG tablet Take 1 tablet (10 mg total) by mouth daily. (Patient not taking: Reported on 07/30/2017) 30 tablet 2  . furosemide (LASIX) 40 MG tablet Take 40 mg by mouth daily.     . metoCLOPramide (REGLAN) 10 MG tablet Take 0.5 tablets (5 mg total) by mouth every 6 (six) hours as needed for nausea or vomiting (nausea/headache). (Patient not taking: Reported on 07/30/2017) 4 tablet 0  . vitamin B-12 (CYANOCOBALAMIN) 1000 MCG tablet Take 1,000 mcg by mouth daily.     No current facility-administered medications on file prior to visit.     BP (!) 148/80   Pulse 88   Temp 98.2 F (36.8 C) (Oral)   Resp 16   Ht 5\' 8"  (1.727 m)   Wt 178 lb 9.6 oz (81 kg)   SpO2 99%   BMI 27.16 kg/m      Objective:   Physical Exam   General- No acute distress. Pleasant patient. Neck- Full range of motion, no jvd Lungs- Clear, even and unlabored. Heart- regular rate and rhythm. Neurologic- CNII- XII grossly intact.  Rt knee- moderate swelling over patella. No pain direct tenderness over tibial plateau.   Rt lower ext-on palpation of the popliteal fossa the area does appear to have fullness/swelling with mild pain on palpation.  But no Homans sign presently.  Also his  right calf seems slightly more swollen than the left but not very obvious.    Assessment & Plan:  Right knee pain and pain behind your knee(popliteal region), we did x-ray of the knee and ultrasound of the leg to evaluate etiology of current condition.  With x-ray will be able to determine if you have degenerative changes to the knee joint and with ultrasound will be able to see if he might have  DVT versus possible Baker's cyst.  Currently considering prescription of diclofenac and possible referral to specialist.  I need to get results of the studies before deciding on which medication to give you and deciding on potential which specialist to refer you to.  You had mentioned possibly injecting the knee with a steroid.  This might be needed if x-ray showed severe degenerative joint disease.  Follow-up date to be determined pending x-ray and ultrasound review.  Note patient sent down to do studies and expedite manner since he had an opening.  AVS was not ready so I asked him to come back after studies are done so he can get the AVS.  Mackie Pai, [name]

## 2017-07-30 NOTE — Patient Instructions (Addendum)
Right knee pain and pain behind your knee(popliteal region), we did x-ray of the knee and ultrasound of the leg to evaluate etiology of current condition.  With x-ray will be able to determine if you have degenerative changes to the knee joint and with ultrasound will be able to see if he might have DVT versus possible Baker's cyst.  Currently considering prescription of diclofenac and possible referral to specialist.  I need to get results of the studies before deciding on which medication to give you and deciding on potential which specialist to refer you to.  You had mentioned possibly injecting the knee with a steroid.  This might be needed if x-ray showed severe degenerative joint disease.  Follow-up date to be determined pending x-ray and ultrasound review.

## 2017-07-30 NOTE — Telephone Encounter (Signed)
Opened to review. rx script.

## 2017-07-31 ENCOUNTER — Telehealth: Payer: Self-pay | Admitting: Medical

## 2017-07-31 ENCOUNTER — Other Ambulatory Visit: Payer: Self-pay | Admitting: Medical

## 2017-07-31 DIAGNOSIS — I82491 Acute embolism and thrombosis of other specified deep vein of right lower extremity: Secondary | ICD-10-CM

## 2017-07-31 MED ORDER — ROPINIROLE HCL ER 2 MG PO TB24: ORAL_TABLET | ORAL | 2 refills | Status: DC

## 2017-07-31 MED ORDER — RIVAROXABAN 20 MG PO TABS: 20.0000 mg | ORAL_TABLET | Freq: Every day | ORAL | 2 refills | Status: DC

## 2017-07-31 NOTE — Telephone Encounter (Signed)
Referral to hemaologist placed.

## 2017-07-31 NOTE — Telephone Encounter (Signed)
Copied from [place] [id number]. Topic: Quick Communication - See Telephone Encounter >> Jul 31, 2017 11:46 AM Ivar Drape wrote: CRM for notification. See Telephone encounter for:  07/31/17. Patient's would like his rOPINIRole (REQUIP XL) 2 MG 24 hr tablet medication to be changed to just the Requip WITHOUT the XL because that 's the one his insurance will take.  Please resend the prescription.

## 2017-07-31 NOTE — Telephone Encounter (Signed)
Prescription of more Xarelto sent to patient's pharmacy after he finishes the starter pack prescription.

## 2017-08-01 ENCOUNTER — Encounter (HOSPITAL_BASED_OUTPATIENT_CLINIC_OR_DEPARTMENT_OTHER): Payer: Self-pay | Admitting: Emergency Medicine

## 2017-08-01 ENCOUNTER — Ambulatory Visit: Payer: Self-pay | Admitting: *Deleted

## 2017-08-01 ENCOUNTER — Other Ambulatory Visit: Payer: Self-pay

## 2017-08-01 ENCOUNTER — Telehealth: Payer: Self-pay | Admitting: Medical

## 2017-08-01 ENCOUNTER — Emergency Department (HOSPITAL_BASED_OUTPATIENT_CLINIC_OR_DEPARTMENT_OTHER)
Admission: EM | Admit: 2017-08-01 | Discharge: 2017-08-01 | Disposition: A | Discharge status: Home / Self Care | Payer: Medicare Other | Attending: Emergency Medicine | Admitting: Emergency Medicine

## 2017-08-01 DIAGNOSIS — R310 Gross hematuria: Secondary | ICD-10-CM | POA: Insufficient documentation

## 2017-08-01 DIAGNOSIS — Z7901 Long term (current) use of anticoagulants: Secondary | ICD-10-CM | POA: Insufficient documentation

## 2017-08-01 DIAGNOSIS — R319 Hematuria, unspecified: Secondary | ICD-10-CM | POA: Diagnosis present

## 2017-08-01 DIAGNOSIS — Z79899 Other long term (current) drug therapy: Secondary | ICD-10-CM | POA: Insufficient documentation

## 2017-08-01 DIAGNOSIS — N189 Chronic kidney disease, unspecified: Secondary | ICD-10-CM | POA: Insufficient documentation

## 2017-08-01 DIAGNOSIS — Z7982 Long term (current) use of aspirin: Secondary | ICD-10-CM | POA: Insufficient documentation

## 2017-08-01 DIAGNOSIS — I129 Hypertensive chronic kidney disease with stage 1 through stage 4 chronic kidney disease, or unspecified chronic kidney disease: Secondary | ICD-10-CM | POA: Diagnosis not present

## 2017-08-01 LAB — URINALYSIS, ROUTINE W REFLEX MICROSCOPIC: HGB URINE DIPSTICK: NEGATIVE

## 2017-08-01 LAB — URINALYSIS, MICROSCOPIC (REFLEX): WBC, UA: NONE SEEN WBC/hpf (ref 0–5)

## 2017-08-01 MED ORDER — ROPINIROLE HCL 4 MG PO TABS: 4.0000 mg | ORAL_TABLET | Freq: Every day | ORAL | 3 refills | Status: DC

## 2017-08-01 NOTE — ED Provider Notes (Signed)
[facility] EMERGENCY DEPARTMENT Provider Note   CSN: [id number] Arrival date & time: 08/01/17  [place]     History   Chief Complaint Chief Complaint  Patient presents with  . Hematuria    HPI Brandon Mason is a 84 y.o. male.  Patient is an 84 year old male with a history of BPH, stroke, hypertension, cardiomegaly, recent diagnosis of DVT started on Xarelto this week presenting today with 2 hours of bloody urine.  Patient states his lower abdomen has felt slightly uncomfortable all day today but did not start having hematuria until 2 hours prior to arrival.  He denies any feelings of retention, dysuria, frequency or urgency.  He is not having fever, back pain or other systemic symptoms.  He has never had hematuria in the past.  No prior history of prostate cancer or radiation to this area.  He did require a TURP several years ago for BPH but no other issues.   The history is provided by the patient.    Past Medical History:  Diagnosis Date  . Abnormality of gait 02/09/2013  . Arthritis   . Cancer ([place])    basal cell skin cancer  . Cardiomegaly   . Chronic kidney disease    BPH  . Chronic leg pain   . Hypersomnia   . Hypertension   . Hypoxia    pulmonary  . Neuromuscular disorder (HCC)    restless leg syndrome   . Nocturia   . Prostate disorder   . Restless legs syndrome (RLS) 02/09/2013  . Restless legs syndrome with nocturnal myoclonus   . RLS (restless legs syndrome)    x 40 years  . Shortness of breath    with exertion   . Sleep apnea    uses cpap  . Stroke [facility])    minor stroke in 2003 - no lasting side effects  . UARS (upper airway resistance syndrome) 06/28/2014    Patient Active Problem List   Diagnosis Date Noted  . HNP (herniated nucleus pulposus) with myelopathy, cervical 03/29/2016  . UARS (upper airway resistance syndrome) 06/28/2014  . Restless leg syndrome, familial, uncontrolled 08/20/2013  . Restless legs syndrome with nocturnal  myoclonus   . Abnormality of gait 02/09/2013  . Restless legs syndrome (RLS) 02/09/2013  . Acute kidney injury ([place]) 03/27/2012    Past Surgical History:  Procedure Laterality Date  . ANTERIOR CERVICAL DECOMP/DISCECTOMY FUSION N/A 03/29/2016   Procedure: ANTERIOR CERVICAL DECOMPRESSION/DISCECTOMY FUSION CERVICAL THREE- CERVICAL FOUR;  Surgeon: Ashok Pall, MD;  Location: [place] [facility];  Service: Neurosurgery;  Laterality: N/A;  . BACK SURGERY  2003   L4-5  . CATARACT EXTRACTION Bilateral   . HERNIA REPAIR     inguinal hernia  . TRANSURETHRAL PROSTATECTOMY WITH GYRUS INSTRUMENTS  04/28/2012   Procedure: TRANSURETHRAL PROSTATECTOMY WITH GYRUS INSTRUMENTS;  Surgeon: Fredricka Bonine, MD;  Location: [facility];  Service: Urology;  Laterality: N/A;  . TRANSURETHRAL RESECTION OF PROSTATE  03/24/2012   Procedure: TRANSURETHRAL RESECTION OF THE PROSTATE (TURP);  Surgeon: Fredricka Bonine, MD;  Location: [facility];  Service: Urology;  Laterality: N/A;  with gyrus ,Greenlight PVP laser of Prostate       Home Medications    Prior to Admission medications   Medication Sig Start Date End Date Taking? Authorizing Provider  amLODipine (NORVASC) 10 MG tablet Take 1 tablet (10 mg total) by mouth daily. Patient not taking: Reported on 07/30/2017 04/16/17   Saguier, Percell Miller, [name]  aspirin 325 MG tablet Take  325 mg by mouth daily.    [provider]  furosemide (LASIX) 40 MG tablet Take 40 mg by mouth daily.  07/31/15   [provider]  gabapentin (NEURONTIN) 600 MG tablet Take 1 tablet (600 mg total) by mouth 3 (three) times daily. 02/27/17   Saguier, Percell Miller, [name]  lisinopril (PRINIVIL,ZESTRIL) 20 MG tablet Take 1 tablet (20 mg total) by mouth daily. 06/26/17   Saguier, Percell Miller, [name]  metoCLOPramide (REGLAN) 10 MG tablet Take 0.5 tablets (5 mg total) by mouth every 6 (six) hours as needed for nausea or vomiting (nausea/headache). Patient not taking: Reported on 07/30/2017 11/04/16    Orlie Dakin, MD  rivaroxaban (XARELTO) 20 MG TABS tablet Take 1 tablet (20 mg total) by mouth daily with supper. To start following starter back rx recently sent in. 07/31/17   Saguier, Percell Miller, [name]  Rivaroxaban 15 & 20 MG TBPK Take as directed on package: Start with one 15mg  tablet by mouth twice a day with food. On Day 22, switch to one 20mg  tablet once a day with food. 07/30/17   Saguier, Percell Miller, [name]  rOPINIRole (REQUIP) 4 MG tablet Take 1 tablet (4 mg total) by mouth at bedtime. 08/01/17   Saguier, Percell Miller, [name]  traMADol (ULTRAM) 50 MG tablet Take 1 tablet (50 mg total) by mouth 2 (two) times daily. Can fill on April 22, 2017 04/16/17   Saguier, Percell Miller, [name]  vitamin B-12 (CYANOCOBALAMIN) 1000 MCG tablet Take 1,000 mcg by mouth daily.    [provider]    Family History Family History  Problem Relation Age of Onset  . Restless legs syndrome Mother   . Restless legs syndrome Daughter   . Esophageal cancer Neg Hx   . Colon cancer Neg Hx   . Pancreatic cancer Neg Hx   . Stomach cancer Neg Hx     Social History Social History   Tobacco Use  . Smoking status: Never Smoker  . Smokeless tobacco: Never Used  Substance Use Topics  . Alcohol use: Yes    Alcohol/week: 1.2 oz    Types: 2 Cans of beer per week    Comment: weekly  . Drug use: No     Allergies   Patient has no known allergies.   Review of Systems Review of Systems  All other systems reviewed and are negative.    Physical Exam Updated Vital Signs BP (!) 174/101 (BP Location: Left Arm)   Pulse 94   Temp 98.8 F (37.1 C) (Oral)   Resp 20   Ht 5\' 8"  (1.727 m)   Wt 80.7 kg (178 lb)   SpO2 93%   BMI 27.06 kg/m   Physical Exam  Constitutional: He is oriented to person, place, and time. He appears well-developed and well-nourished. No distress.  HENT:  Head: Normocephalic and atraumatic.  Mouth/Throat: Oropharynx is clear and moist.  Eyes: Conjunctivae and EOM are normal. Pupils are equal,  round, and reactive to light.  Neck: Normal range of motion. Neck supple.  Cardiovascular: Normal rate, regular rhythm and intact distal pulses.  No murmur heard. Pulmonary/Chest: Effort normal and breath sounds normal. No respiratory distress. He has no wheezes. He has no rales.  Abdominal: Soft. He exhibits no distension. There is no tenderness. There is no rebound and no guarding.  Musculoskeletal: Normal range of motion. He exhibits no edema or tenderness.  Neurological: He is alert and oriented to person, place, and time.  Skin: Skin is warm and dry. No rash noted. No erythema.  Psychiatric: He has a normal mood and affect. His behavior is normal.  Nursing note and vitals reviewed.    ED Treatments / Results  Labs (all labs ordered are listed, but only abnormal results are displayed) Labs Reviewed  URINALYSIS, ROUTINE W REFLEX MICROSCOPIC - Abnormal; Notable for the following components:      Result Value   Color, Urine RED (*)    APPearance TURBID (*)    Glucose, UA   (*)    Value: TEST NOT REPORTED DUE TO COLOR INTERFERENCE OF URINE PIGMENT   Bilirubin Urine   (*)    Value: TEST NOT REPORTED DUE TO COLOR INTERFERENCE OF URINE PIGMENT   Ketones, ur   (*)    Value: TEST NOT REPORTED DUE TO COLOR INTERFERENCE OF URINE PIGMENT   Protein, ur   (*)    Value: TEST NOT REPORTED DUE TO COLOR INTERFERENCE OF URINE PIGMENT   Nitrite   (*)    Value: TEST NOT REPORTED DUE TO COLOR INTERFERENCE OF URINE PIGMENT   Leukocytes, UA   (*)    Value: TEST NOT REPORTED DUE TO COLOR INTERFERENCE OF URINE PIGMENT   All other components within normal limits  URINALYSIS, MICROSCOPIC (REFLEX) - Abnormal; Notable for the following components:   Bacteria, UA RARE (*)    Squamous Epithelial / LPF 0-5 (*)    All other components within normal limits    EKG  EKG Interpretation None       Radiology No results found.  Procedures Procedures (including critical care time)  Medications  Ordered in ED Medications - No data to display   Initial Impression / Assessment and Plan / ED Course  I have reviewed the triage vital signs and the nursing notes.  Pertinent labs & imaging results that were available during my care of the patient were reviewed by me and considered in my medical decision making (see chart for details).    Elderly gentleman presenting today anticoagulation with Xarelto for DVT now with hematuria.  Patient is passing blood and clot in his urine by bedside urinal.  Patient denies any retention type symptoms.  Will check urine to ensure no evidence of UTI however he denied any UTI symptoms prior to this starting.  Three-way Foley was placed and urine was irrigated.  Urine did irrigate to a light pink color.  We will continue to monitor to ensure bleeding does not significantly worsen.  he has no symptoms concerning for kidney stone, pyelonephritis.  He has no abdominal pain at this time.  10:11 PM UA without signs of infection at this time.  After irrigating and after clamping foley for 1hour and releasing no signs of extensive bleeding.  Discussed holding xarelto nightly dose and morning dose until he speaks with his PCP.  Final Clinical Impressions(s) / ED Diagnoses   Final diagnoses:  Gross hematuria    ED Discharge Orders    None       Blanchie Dessert, MD 08/01/17 2213

## 2017-08-01 NOTE — Telephone Encounter (Signed)
Pt states he has been taking Xarelto for 3 days after being diagnosed with a blood clot. Pt states he experienced urinating "solid blood"  with a little abdominal discomfort approximately two times today.  Pt advised to seek treatment in the ED. Pt verbalized understanding.  Reason for Disposition . Taking Coumadin (warfarin) or other strong blood thinner, or known bleeding disorder (e.g., thrombocytopenia)  Answer Assessment - Initial Assessment Questions 1. COLOR of URINE: "Describe the color of the urine."  (e.g., tea-colored, pink, red, blood clots, bloody)     Solid blood  2. ONSET: "When did the bleeding start?"      About 30 min ago 3. EPISODES: "How many times has there been blood in the urine?" or "How many times today?"     Maybe twice today 4. PAIN with URINATION: "Is there any pain with passing your urine?" If so, ask: "How bad is the pain?"  (Scale 1-10; or mild, moderate, severe)    - MILD - complains slightly about urination hurting    - MODERATE - interferes with normal activities      - SEVERE - excruciating, unwilling or unable to urinate because of the pain      A little abdominal discomfort 5. FEVER: "Do you have a fever?" If so, ask: "What is your temperature, how was it measured, and when did it start?"     No 6. ASSOCIATED SYMPTOMS: "Are you passing urine more frequently than usual?"     No 7. OTHER SYMPTOMS: "Do you have any other symptoms?" (e.g., back/flank pain, abdominal pain, vomiting)     A little abdominal discomfort  Protocols used: URINE - BLOOD IN-A-AH

## 2017-08-01 NOTE — ED Notes (Signed)
EDP at [facility] 

## 2017-08-01 NOTE — ED Triage Notes (Signed)
Patient reports blood in urine x 1 hour.  Reports placed on xarelto on Wednesday for DVT.  Reports some dysuria.

## 2017-08-01 NOTE — Telephone Encounter (Signed)
See phone note 06/23/2017

## 2017-08-01 NOTE — Telephone Encounter (Signed)
Sent in regular Requip 4 mg to patient's pharmacy.  Will you call his pharmacy and see how much they will charge him.

## 2017-08-01 NOTE — ED Notes (Signed)
Urine light yellow, scant pink tinge, clearer. Denies sx or complaints, questions or needs. VSS. States, "ready to go".

## 2017-08-01 NOTE — ED Notes (Addendum)
Foley draining pink. Irrigation clamped. Chamber emptied at this time. Will re-check urine at 2145. Alert, NAD, calm, interactive, resps e/u, speaking in clear complete sentences, no dyspnea noted, skin W&D, VSS, reports only foley discomfort, (denies: pain, sob, nausea, dizziness or visual changes). Family at [facility]. Updated with plan. Denies further questions or needs at this time.

## 2017-08-01 NOTE — Telephone Encounter (Signed)
Sent in Requip generic 4 mg to patient's pharmacy.  Would you call [facility] and see how much that is going to cost him.

## 2017-08-01 NOTE — ED Notes (Signed)
EDP at [facility] discussing results and plan  

## 2017-08-01 NOTE — ED Notes (Signed)
Irrigated and foley inserted by [employer] and assisted by EMT [place]

## 2017-08-04 ENCOUNTER — Encounter: Payer: Self-pay | Admitting: Medical

## 2017-08-04 ENCOUNTER — Ambulatory Visit (INDEPENDENT_AMBULATORY_CARE_PROVIDER_SITE_OTHER): Payer: Medicare Other | Admitting: Medical

## 2017-08-04 ENCOUNTER — Ambulatory Visit (HOSPITAL_BASED_OUTPATIENT_CLINIC_OR_DEPARTMENT_OTHER)
Admission: RE | Admit: 2017-08-04 | Discharge: 2017-08-04 | Disposition: A | Discharge status: Home / Self Care | Payer: Medicare Other | Source: Ambulatory Visit | Attending: Medical | Admitting: Medical

## 2017-08-04 ENCOUNTER — Telehealth: Payer: Self-pay | Admitting: Medical

## 2017-08-04 VITALS — BP 180/90 | HR 75 | Temp 98.1°F | Resp 16 | Ht 68.0 in | Wt 181.0 lb

## 2017-08-04 DIAGNOSIS — R319 Hematuria, unspecified: Secondary | ICD-10-CM

## 2017-08-04 DIAGNOSIS — I82491 Acute embolism and thrombosis of other specified deep vein of right lower extremity: Secondary | ICD-10-CM | POA: Insufficient documentation

## 2017-08-04 DIAGNOSIS — I10 Essential (primary) hypertension: Secondary | ICD-10-CM

## 2017-08-04 DIAGNOSIS — M7989 Other specified soft tissue disorders: Secondary | ICD-10-CM | POA: Diagnosis not present

## 2017-08-04 DIAGNOSIS — R5383 Other fatigue: Secondary | ICD-10-CM

## 2017-08-04 DIAGNOSIS — O2230 Deep phlebothrombosis in pregnancy, unspecified trimester: Secondary | ICD-10-CM

## 2017-08-04 LAB — POC URINALSYSI DIPSTICK (AUTOMATED)
Bilirubin, UA: NEGATIVE
Blood, UA: NEGATIVE
Glucose, UA: NEGATIVE
Ketones, UA: NEGATIVE
LEUKOCYTES UA: NEGATIVE
Nitrite, UA: NEGATIVE
PH UA: 6 (ref 5.0–8.0)
Protein, UA: NEGATIVE
Spec Grav, UA: 1.025 (ref 1.010–1.025)
Urobilinogen, UA: NEGATIVE E.U./dL — AB

## 2017-08-04 MED ORDER — HYDROCHLOROTHIAZIDE 25 MG PO TABS: 25.0000 mg | ORAL_TABLET | Freq: Every day | ORAL | 0 refills | Status: DC

## 2017-08-04 NOTE — Progress Notes (Signed)
Subjective:    Patient ID: Brandon Mason, male    DOB: Sep 19, 1925, 84 y.o.   MRN: [id number]  HPI  Pt in for follow up.  Pt went to ED. He was seen for hematuria. On Friday and Saturday he had globs/clots of blood as large as tip of his thumb. Pt states last time he took xarelto was Friday morning. He states no gross blood since Saturday morning. Pt was never a smoker but some second hand smoke.  Patient states Dr. on call on Saturday advised him to continue the Xarelto but he was very concerned about the gross blood and clots that he saw so he has not started the Xarelto back.  I looked in epic and did not find that documented.  On reviewed no blood in urine on prior UA that I reviewed in past.  Pt does report some pain in back of his leg rt side. Little more than previously.   At home pt states at home his bp was 160/80.  Pt is on amlodipine and zestril. No cardiac or neurologic signs or symptoms. He thinks bp might be up due to stress from the above.   Review of Systems  Constitutional: Negative for chills, fatigue and fever.  HENT: Negative for congestion, drooling and ear pain.   Respiratory: Negative for cough, chest tightness, shortness of breath and wheezing.   Cardiovascular: Negative for chest pain and palpitations.  Gastrointestinal: Negative for abdominal pain.  Musculoskeletal: Negative for arthralgias, back pain, neck pain and neck stiffness.       See hpi/rt lower ext.  Skin: Negative for rash.  Neurological: Negative for dizziness, seizures, speech difficulty, weakness, light-headedness and numbness.  Hematological: Negative for adenopathy. Does not bruise/bleed easily.  Psychiatric/Behavioral: Negative for agitation, behavioral problems, confusion and sleep disturbance. The patient is not nervous/anxious.     Past Medical History:  Diagnosis Date  . Abnormality of gait 02/09/2013  . Arthritis   . Cancer ([place])    basal cell skin cancer  . Cardiomegaly   .  Chronic kidney disease    BPH  . Chronic leg pain   . Hypersomnia   . Hypertension   . Hypoxia    pulmonary  . Neuromuscular disorder (HCC)    restless leg syndrome   . Nocturia   . Prostate disorder   . Restless legs syndrome (RLS) 02/09/2013  . Restless legs syndrome with nocturnal myoclonus   . RLS (restless legs syndrome)    x 40 years  . Shortness of breath    with exertion   . Sleep apnea    uses cpap  . Stroke [facility])    minor stroke in 2003 - no lasting side effects  . UARS (upper airway resistance syndrome) 06/28/2014     Social History   Socioeconomic History  . Marital status: Married    Spouse name: [name]  . Number of children: 5  . Years of education: 41  . Highest education level: Not on file  Social Needs  . Financial resource strain: Not on file  . Food insecurity - worry: Not on file  . Food insecurity - inability: Not on file  . Transportation needs - medical: Not on file  . Transportation needs - non-medical: Not on file  Occupational History  . 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 Smoking status: Never Smoker  . Smokeless tobacco: Never Used  Substance and Sexual Activity  .  Alcohol use: Yes    Alcohol/week: 1.2 oz    Types: 2 Cans of beer per week    Comment: weekly  . Drug use: No  . Sexual activity: Not on file  Other Topics Concern  . Not on file  Social History Narrative   Patient lives at home with his wife [name].   Patient is retired.    Patient has 5 children.   Patient has a high school education.   Patient is right-handed.   Patient drinks very little caffeine.    Past Surgical History:  Procedure Laterality Date  . ANTERIOR CERVICAL DECOMP/DISCECTOMY FUSION N/A 03/29/2016   Procedure: ANTERIOR CERVICAL DECOMPRESSION/DISCECTOMY FUSION CERVICAL THREE- CERVICAL FOUR;  Surgeon: Ashok Pall, MD;  Location: [facility]UR[facility] ORS;  Service: Neurosurgery;  Laterality: N/A;  . BACK SURGERY  2003    L4-5  . CATARACT EXTRACTION Bilateral   . HERNIA REPAIR     inguinal hernia  . TRANSURETHRAL PROSTATECTOMY WITH GYRUS INSTRUMENTS  04/28/2012   Procedure: TRANSURETHRAL PROSTATECTOMY WITH GYRUS INSTRUMENTS;  Surgeon: Fredricka Bonine, MD;  Location: [facility];  Service: Urology;  Laterality: N/A;  . TRANSURETHRAL RESECTION OF PROSTATE  03/24/2012   Procedure: TRANSURETHRAL RESECTION OF THE PROSTATE (TURP);  Surgeon: Fredricka Bonine, MD;  Location: [facility];  Service: Urology;  Laterality: N/A;  with gyrus ,Greenlight PVP laser of Prostate    Family History  Problem Relation Age of Onset  . Restless legs syndrome Mother   . Restless legs syndrome Daughter   . Esophageal cancer Neg Hx   . Colon cancer Neg Hx   . Pancreatic cancer Neg Hx   . Stomach cancer Neg Hx     No Known Allergies  Current Outpatient Medications on File Prior to Visit  Medication Sig Dispense Refill  . amLODipine (NORVASC) 10 MG tablet Take 1 tablet (10 mg total) by mouth daily. 30 tablet 2  . aspirin 325 MG tablet Take 325 mg by mouth daily.    . furosemide (LASIX) 40 MG tablet Take 40 mg by mouth daily.     Marland Kitchen gabapentin (NEURONTIN) 600 MG tablet Take 1 tablet (600 mg total) by mouth 3 (three) times daily. 270 tablet 1  . lisinopril (PRINIVIL,ZESTRIL) 20 MG tablet Take 1 tablet (20 mg total) by mouth daily. 90 tablet 1  . metoCLOPramide (REGLAN) 10 MG tablet Take 0.5 tablets (5 mg total) by mouth every 6 (six) hours as needed for nausea or vomiting (nausea/headache). 4 tablet 0  . Rivaroxaban 15 & 20 MG TBPK Take as directed on package: Start with one 15mg  tablet by mouth twice a day with food. On Day 22, switch to one 20mg  tablet once a day with food. 51 each 0  . rOPINIRole (REQUIP) 4 MG tablet Take 1 tablet (4 mg total) by mouth at bedtime. 30 tablet 3  . traMADol (ULTRAM) 50 MG tablet Take 1 tablet (50 mg total) by mouth 2 (two) times daily. Can fill on April 22, 2017 60 tablet 2  . vitamin B-12  (CYANOCOBALAMIN) 1000 MCG tablet Take 1,000 mcg by mouth daily.    . rivaroxaban (XARELTO) 20 MG TABS tablet Take 1 tablet (20 mg total) by mouth daily with supper. To start following starter back rx recently sent in. (Patient not taking: Reported on 08/04/2017) 30 tablet 2   No current facility-administered medications on file prior to visit.     BP (!) 189/95   Pulse 75   Temp 98.1 F (36.7 C) (  Oral)   Resp 16   Ht 5\' 8"  (1.727 m)   Wt 181 lb (82.1 kg)   SpO2 97%   BMI 27.52 kg/m       Objective:   Physical Exam  General Appearance- Not in acute distress.  HEENT Eyes- Scleraeral/Conjuntiva-bilat- Not Yellow. Mouth & Throat- Normal.  Chest and Lung Exam Auscultation: Breath sounds:-Normal. Adventitious sounds:- No Adventitious sounds.  Cardiovascular Auscultation:Rythm - Regular. Heart Sounds -Normal heart sounds.  Abdomen Inspection:-Inspection Normal.  Palpation/Perucssion: Palpation and Percussion of the abdomen reveal- Non Tender, No Rebound tenderness, No rigidity(Guarding) and No Palpable abdominal masses.  Liver:-Normal.  Spleen:- Normal.   Rt lower ext-is not swollen and is not warm to touch.  He does have mild positive Homans sign presently.  Some mild tenderness to palpation mid calf area as well.  Left lower extremity-no swelling.  No calf pain on palpation.  Negative Homans sign..  Neuro- CN III-XIII grossly intact.      Assessment & Plan:  For your recent blood in your urine while you are on Xarelto, I want to repeat your ultrasound since she stopped the Xarelto on Friday.  I want to assess and see if the DVT has expanded.  The bleeding that you described sounded severe.  So treatment for the DVT with a recent hematuria does present treatment dilemma.  Your urine does not show any blood presently when we ran test today.  With your recent fatigue and described bleeding, I do want to get a CBC.  Will follow your ultrasound and let you know those results  when they are in.  If you do not mind I want you to stay in our office while we wait for the stat ultrasound results.  With a recent DVT and stopping of Xarelto on Friday, we need to watch you closely.  If you get any chest pain or shortness of breath then recommend being seen in the emergency department for evaluation of pulmonary embolism.  I have put out referral to Dr. Ennever/hematologist and talk with Dr. Etter Sjogren on how to approach treatment with your recent conditions.  I am hoping to get a call back from Dr. Marin Olp sometime today.  After DVT results back might call upstairs and asked to speak directly with Dr. Marin Olp.  Follow-up date to be determined after study review  Will discuss bp level. As well. Will send in hctz diuretic. If pt bp at home consistent above 140/90 then add diuretic.  After hours tried to call Dr. Marin Olp and not available(he was not on call). Went up stairs and they were already closed. Sent note seeking advise by email. Will try to call Dr. Marin Olp tomorrow. Discussed with supervisor/gave update before leaving office today.  Mackie Pai, [name]

## 2017-08-04 NOTE — Patient Instructions (Addendum)
For your recent blood in your urine while you are on Xarelto, I want to repeat your ultrasound since she stopped the Xarelto on Friday.  I want to assess and see if the DVT has expanded.  The bleeding that you described sounded severe.  So treatment for the DVT with a recent hematuria does present treatment dilemma.  Your urine does not show any blood presently when we ran test today.  With your recent fatigue and described bleeding, I do want to get a CBC.  Will follow your ultrasound and let you know those results when they are in.  If you do not mind I want you to stay in our office while we wait for the stat ultrasound results.  With a recent DVT and stopping of Xarelto on Friday, we need to watch you closely.  If you get any chest pain or shortness of breath then recommend being seen in the emergency department for evaluation of pulmonary embolism.  I have put out referral to Dr. Ennever/hematologist and talk with Dr. Etter Sjogren on how to approach treatment with your recent conditions.  I am hoping to get a call back from Dr. Marin Olp sometime today.  After DVT results back might call upstairs and asked to speak directly with Dr. Marin Olp.  Follow-up date to be determined after study review.

## 2017-08-04 NOTE — Telephone Encounter (Signed)
FYI

## 2017-08-04 NOTE — Telephone Encounter (Signed)
Dr. Marin Olp,  Recently patient diagnosed with a right lower extremity DVT and I placed him on Xarelto last week.  After about 3 days he had some described gross hematuria and was evaluated in the emergency department.  A little bit of lower abdomen discomfort as well.  He was advised to hold Xarelto until follow-up with me.  I am seeing him later today.  I do not see that he has had hematuria in the past before.  I put in referral for him to see you but wanted your advice regarding what you recommend.  Under the circumstances I am not sure if he needs a stat referral to urologist for cystoscopy while he is under some form of anticoagulation.  Wanted to your insight and help?  Thanks, Mackie Pai, [name]

## 2017-08-04 NOTE — Telephone Encounter (Signed)
Open to refer to hematologist and to send note to Dr. Marin Olp

## 2017-08-05 ENCOUNTER — Telehealth: Payer: Self-pay | Admitting: Medical

## 2017-08-05 LAB — CBC WITH DIFFERENTIAL/PLATELET
BASOS ABS: 0.1 10*3/uL (ref 0.0–0.1)
Basophils Relative: 0.8 % (ref 0.0–3.0)
EOS ABS: 0.2 10*3/uL (ref 0.0–0.7)
Eosinophils Relative: 3.3 % (ref 0.0–5.0)
HEMATOCRIT: 37.4 % — AB (ref 39.0–52.0)
Hemoglobin: 12.5 g/dL — ABNORMAL LOW (ref 13.0–17.0)
Lymphocytes Relative: 26.1 % (ref 12.0–46.0)
Lymphs Abs: 1.6 10*3/uL (ref 0.7–4.0)
MCHC: 33.5 g/dL (ref 30.0–36.0)
MCV: 101.8 fl — ABNORMAL HIGH (ref 78.0–100.0)
Monocytes Absolute: 0.7 10*3/uL (ref 0.1–1.0)
Monocytes Relative: 11.7 % (ref 3.0–12.0)
NEUTROS ABS: 3.6 10*3/uL (ref 1.4–7.7)
Neutrophils Relative %: 58.1 % (ref 43.0–77.0)
PLATELETS: 265 10*3/uL (ref 150.0–400.0)
RBC: 3.67 Mil/uL — ABNORMAL LOW (ref 4.22–5.81)
RDW: 12.8 % (ref 11.5–15.5)
WBC: 6.2 10*3/uL (ref 4.0–10.5)

## 2017-08-05 LAB — COMPREHENSIVE METABOLIC PANEL
ALT: 8 U/L (ref 0–53)
AST: 11 U/L (ref 0–37)
Albumin: 3.8 g/dL (ref 3.5–5.2)
Alkaline Phosphatase: 52 U/L (ref 39–117)
BILIRUBIN TOTAL: 0.4 mg/dL (ref 0.2–1.2)
BUN: 28 mg/dL — ABNORMAL HIGH (ref 6–23)
CALCIUM: 8.8 mg/dL (ref 8.4–10.5)
CO2: 29 meq/L (ref 19–32)
CREATININE: 1.17 mg/dL (ref 0.40–1.50)
Chloride: 107 mEq/L (ref 96–112)
GFR: 62.36 mL/min (ref >60.00)
GLUCOSE: 94 mg/dL (ref 70–99)
Potassium: 4 mEq/L (ref 3.5–5.1)
Sodium: 142 mEq/L (ref 135–145)
Total Protein: 6.8 g/dL (ref 6.0–8.3)

## 2017-08-05 MED ORDER — APIXABAN 5 MG PO TABS: 5.0000 mg | ORAL_TABLET | Freq: Two times a day (BID) | ORAL | 2 refills | Status: DC

## 2017-08-05 NOTE — Telephone Encounter (Signed)
I talk with Dr. Marin Olp and he recommends patient switching to Eliquis and stopping   Not using Xarelto.  He thinks Eliquis has less chance of causing bleeding.  I explained to patient that I had  spoke with hematologist and this is his recommendation.  Patient has appointment with Dr. Marin Olp in about a couple weeks.  I asked patient that if he has problems filling the Eliquis to please let us know.  If he uses Eliquis and has any gross bleeding as before then notify us and come in for evaluation.  Will also go ahead and refer patient to urologist with incidence of gross hematuria but will try to make that referral for a month or so.  Also note  Patient does not report any shortness of breath.  He still has some faint pain in the popliteal area.  His ultrasound looks stable with no extension yesterday.  However he does have known Baker's cyst as well.  I explained to him that after he is treated for the DVT then would refer him orthopedist   Would you mind calling [facility] and seeing if there is a problem filling the Eliquis.  If he had to stop Xarelto due to gross hematuria.  We switch him to Eliquis at recommendation of hematologist.

## 2017-08-06 ENCOUNTER — Telehealth: Payer: Self-pay | Admitting: Medical

## 2017-08-06 NOTE — Telephone Encounter (Signed)
Pt declined AWV this year due to so many appts already.

## 2017-08-21 ENCOUNTER — Encounter: Payer: Self-pay | Admitting: Hematology & Oncology

## 2017-08-21 ENCOUNTER — Inpatient Hospital Stay: Payer: Medicare Other

## 2017-08-21 ENCOUNTER — Other Ambulatory Visit: Payer: Self-pay

## 2017-08-21 ENCOUNTER — Inpatient Hospital Stay: Payer: Medicare Other | Attending: Hematology & Oncology | Admitting: Hematology & Oncology

## 2017-08-21 VITALS — BP 132/74 | HR 81 | Temp 98.3°F | Resp 16 | Wt 177.0 lb

## 2017-08-21 DIAGNOSIS — M5000 Cervical disc disorder with myelopathy, unspecified cervical region: Secondary | ICD-10-CM

## 2017-08-21 DIAGNOSIS — I129 Hypertensive chronic kidney disease with stage 1 through stage 4 chronic kidney disease, or unspecified chronic kidney disease: Secondary | ICD-10-CM | POA: Insufficient documentation

## 2017-08-21 DIAGNOSIS — Z8673 Personal history of transient ischemic attack (TIA), and cerebral infarction without residual deficits: Secondary | ICD-10-CM | POA: Diagnosis not present

## 2017-08-21 DIAGNOSIS — Z85828 Personal history of other malignant neoplasm of skin: Secondary | ICD-10-CM | POA: Insufficient documentation

## 2017-08-21 DIAGNOSIS — G4730 Sleep apnea, unspecified: Secondary | ICD-10-CM | POA: Insufficient documentation

## 2017-08-21 DIAGNOSIS — N189 Chronic kidney disease, unspecified: Secondary | ICD-10-CM | POA: Diagnosis not present

## 2017-08-21 DIAGNOSIS — G2581 Restless legs syndrome: Secondary | ICD-10-CM | POA: Diagnosis not present

## 2017-08-21 DIAGNOSIS — I82431 Acute embolism and thrombosis of right popliteal vein: Secondary | ICD-10-CM | POA: Diagnosis not present

## 2017-08-21 DIAGNOSIS — R351 Nocturia: Secondary | ICD-10-CM | POA: Insufficient documentation

## 2017-08-21 DIAGNOSIS — Z7901 Long term (current) use of anticoagulants: Secondary | ICD-10-CM

## 2017-08-21 DIAGNOSIS — I517 Cardiomegaly: Secondary | ICD-10-CM | POA: Insufficient documentation

## 2017-08-21 DIAGNOSIS — M7989 Other specified soft tissue disorders: Secondary | ICD-10-CM | POA: Insufficient documentation

## 2017-08-21 DIAGNOSIS — Z79899 Other long term (current) drug therapy: Secondary | ICD-10-CM | POA: Diagnosis not present

## 2017-08-21 DIAGNOSIS — M129 Arthropathy, unspecified: Secondary | ICD-10-CM

## 2017-08-21 DIAGNOSIS — I82591 Chronic embolism and thrombosis of other specified deep vein of right lower extremity: Secondary | ICD-10-CM

## 2017-08-21 LAB — CBC WITH DIFFERENTIAL (CANCER CENTER ONLY)
BASOS ABS: 0 10*3/uL (ref 0.0–0.1)
Basophils Relative: 0 %
EOS ABS: 0.1 10*3/uL (ref 0.0–0.5)
EOS PCT: 2 %
HCT: 38.9 % (ref 38.7–49.9)
Hemoglobin: 13.1 g/dL (ref 13.0–17.1)
Lymphocytes Relative: 29 %
Lymphs Abs: 1.8 10*3/uL (ref 0.9–3.3)
MCH: 33.7 pg — ABNORMAL HIGH (ref 28.0–33.4)
MCHC: 33.7 g/dL (ref 32.0–35.9)
MCV: 100 fL — AB (ref 82.0–98.0)
MONO ABS: 0.8 10*3/uL (ref 0.1–0.9)
Monocytes Relative: 13 %
Neutro Abs: 3.5 10*3/uL (ref 1.5–6.5)
Neutrophils Relative %: 56 %
PLATELETS: 249 10*3/uL (ref 145–400)
RBC: 3.89 MIL/uL — AB (ref 4.20–5.70)
RDW: 11.7 % (ref 11.1–15.7)
WBC: 6.3 10*3/uL (ref 4.0–10.0)

## 2017-08-21 LAB — CMP (CANCER CENTER ONLY)
ALT: 18 U/L (ref 10–47)
ANION GAP: 7 (ref 5–15)
AST: 22 U/L (ref 11–38)
Albumin: 3.7 g/dL (ref 3.5–5.0)
Alkaline Phosphatase: 54 U/L (ref 26–84)
BUN: 34 mg/dL — AB (ref 7–22)
CALCIUM: 9 mg/dL (ref 8.0–10.3)
CO2: 32 mmol/L (ref 18–33)
Chloride: 105 mmol/L (ref 98–108)
Creatinine: 1.3 mg/dL — ABNORMAL HIGH (ref 0.60–1.20)
GLUCOSE: 97 mg/dL (ref 73–118)
POTASSIUM: 3.7 mmol/L (ref 3.3–4.7)
SODIUM: 144 mmol/L (ref 128–145)
TOTAL PROTEIN: 7 g/dL (ref 6.4–8.1)
Total Bilirubin: 0.9 mg/dL (ref 0.2–1.6)

## 2017-08-21 NOTE — Progress Notes (Signed)
Referral MD  Reason for Referral: Thrombus in the right leg  Chief Complaint  Patient presents with  . New Patient (Initial Visit)  : I am not sure why I am here.  HPI: Brandon Mason is a very nice 84 year old white male.  I take care of his wife.  He did serve in the [place] in [place].  He was there for 9 months.  He worked in Aeronautical engineer for 50 years.  Apparently, he has a stroke about 3 years ago.  This was in the occipital region.  He was put on aspirin.  He recovered fully.  He was told that this stroke happened because he had a blood clot in his body that went to his brain.  He has chronic swelling in his legs.  He began to have some pain in his right leg.  He has some more swelling in the right leg.  He does wear compression stockings.  He had a Doppler done.  This was done on August 04, 2017.  This showed a thrombus in a posterior tibial vein in the right leg.  He was found to have a Baker's cyst.  However, he was told that because he has a blood clot, the Baker's cyst cannot be treated.  He initially was placed on Xarelto.  He apparently had issues with bleeding.  He had hematuria.  He went to the emergency room.  He had a Foley catheter placed.  He was not referred to urology.  He had been on aspirin previously.  This was stopped.  He now is on Eliquis.  Is having no bleeding.  He was kindly referred to the [facility] [place] [facility] for an evaluation.  There is no history of blood clots in the family.  He says that his PSA is normal.  He does not smoke.  He does not drink.  He is not diabetic.  He has had no recent long drives.  He does go to the beach with his wife.  He has had no cough or shortness of breath.  He has had no nausea or vomiting.  Overall, his performance status is ECOG 2.    Past Medical History:  Diagnosis Date  . Abnormality of gait 02/09/2013  . Arthritis   . Cancer ([place])    basal cell skin cancer  . Cardiomegaly   .  Chronic kidney disease    BPH  . Chronic leg pain   . Hypersomnia   . Hypertension   . Hypoxia    pulmonary  . Neuromuscular disorder (HCC)    restless leg syndrome   . Nocturia   . Prostate disorder   . Restless legs syndrome (RLS) 02/09/2013  . Restless legs syndrome with nocturnal myoclonus   . RLS (restless legs syndrome)    x 40 years  . Shortness of breath    with exertion   . Sleep apnea    uses cpap  . Stroke [facility])    minor stroke in 2003 - no lasting side effects  . UARS (upper airway resistance syndrome) 06/28/2014  :  Past Surgical History:  Procedure Laterality Date  . ANTERIOR CERVICAL DECOMP/DISCECTOMY FUSION N/A 03/29/2016   Procedure: ANTERIOR CERVICAL DECOMPRESSION/DISCECTOMY FUSION CERVICAL THREE- CERVICAL FOUR;  Surgeon: Ashok Pall, MD;  Location: [place] NEURO ORS;  Service: Neurosurgery;  Laterality: N/A;  . BACK SURGERY  2003   L4-5  . CATARACT EXTRACTION Bilateral   . HERNIA REPAIR     inguinal hernia  .  TRANSURETHRAL PROSTATECTOMY WITH GYRUS INSTRUMENTS  04/28/2012   Procedure: TRANSURETHRAL PROSTATECTOMY WITH GYRUS INSTRUMENTS;  Surgeon: Fredricka Bonine, MD;  Location: [facility];  Service: Urology;  Laterality: N/A;  . TRANSURETHRAL RESECTION OF PROSTATE  03/24/2012   Procedure: TRANSURETHRAL RESECTION OF THE PROSTATE (TURP);  Surgeon: Fredricka Bonine, MD;  Location: [facility];  Service: Urology;  Laterality: N/A;  with gyrus ,Greenlight PVP laser of Prostate  :   Current Outpatient Medications:  .  apixaban (ELIQUIS) 5 MG TABS tablet, Take 1 tablet (5 mg total) by mouth 2 (two) times daily., Disp: 60 tablet, Rfl: 2 .  gabapentin (NEURONTIN) 600 MG tablet, Take 1 tablet (600 mg total) by mouth 3 (three) times daily., Disp: 270 tablet, Rfl: 1 .  hydrochlorothiazide (HYDRODIURIL) 25 MG tablet, Take 1 tablet (25 mg total) by mouth daily., Disp: 30 tablet, Rfl: 0 .  lisinopril (PRINIVIL,ZESTRIL) 20 MG tablet, Take 1 tablet (20 mg total) by mouth  daily., Disp: 90 tablet, Rfl: 1 .  rOPINIRole (REQUIP) 4 MG tablet, Take 1 tablet (4 mg total) by mouth at bedtime., Disp: 30 tablet, Rfl: 3 .  traMADol (ULTRAM) 50 MG tablet, Take 1 tablet (50 mg total) by mouth 2 (two) times daily. Can fill on April 22, 2017, Disp: 60 tablet, Rfl: 2:  :  No Known Allergies:  Family History  Problem Relation Age of Onset  . Restless legs syndrome Mother   . Restless legs syndrome Daughter   . Esophageal cancer Neg Hx   . Colon cancer Neg Hx   . Pancreatic cancer Neg Hx   . Stomach cancer Neg Hx   :  Social History   Socioeconomic History  . Marital status: Married    Spouse name: Brandon Mason  . Number of children: 5  . Years of education: 60  . Highest education level: Not on file  Social Needs  . Financial resource strain: Not on file  . Food insecurity - worry: Not on file  . Food insecurity - inability: Not on file  . Transportation needs - medical: Not on file  . Transportation needs - non-medical: Not on file  Occupational History  . 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 Smoking status: Never Smoker  . Smokeless tobacco: Never Used  Substance and Sexual Activity  . Alcohol use: Yes    Alcohol/week: 1.2 oz    Types: 2 Cans of beer per week    Comment: weekly  . Drug use: No  . Sexual activity: Not on file  Other Topics Concern  . Not on file  Social History Narrative   Patient lives at home with his wife Brandon Mason.   Patient is retired.    Patient has 5 children.   Patient has a high school education.   Patient is right-handed.   Patient drinks very little caffeine.  :  Review of Systems  Constitutional: Negative.   HENT: Negative.   Eyes: Negative.   Respiratory: Negative.   Cardiovascular: Positive for leg swelling.  Gastrointestinal: Negative.   Genitourinary: Negative.   Musculoskeletal: Negative.   Skin: Negative.   Neurological: Negative.   Endo/Heme/Allergies: Negative.    Psychiatric/Behavioral: Negative.      Exam: Elderly white male in no obvious distress.  Vital signs show temperature of 98.3.  Pulse 81.  Blood pressure 132/74.  Weight is 177 pounds.  Head exam shows no ocular or oral lesions.  There are no palpable  cervical or supraclavicular lymph nodes.  Lungs are clear bilaterally.  Cardiac exam regular rate and rhythm with no murmurs, rubs or bruits.  Abdomen is soft.  He has decent bowel sounds.  There is no fluid wave.  There is no palpable liver or spleen tip.  Back exam shows no tenderness over the spine, ribs or hips.  Extremities show some chronic swelling in both lower legs.  He has no obvious venous cord in the lower legs.  Neurological exam shows no focal neurological deficits.  Skin exam shows some scattered actinic keratoses. @IPVITALS @   Recent Labs    08/21/17 1332  WBC 6.3  HCT 38.9  PLT 249   Recent Labs    08/21/17 1332  NA 144  K 3.7  CL 105  CO2 32  GLUCOSE 97  BUN 34*  CREATININE 1.30*  CALCIUM 9.0    Blood smear review: None  Pathology: None    Assessment and Plan: Mr. Leatham is a 84 year old white male.  He has a thrombus in the right lower leg.  This is in the popliteal vein.  There does not appear to be any extension proximally.  I do not think that he needs any kind of hypercoagulable workup.  I do not think he needs a malignancy workup.  As far as how long he needs to be on blood thinner, I probably would consider a full dose Eliquis for 6-12 months.  I would repeat a Doppler in 3 months to see how the thrombus appears.  As far as adding anything to the Eliquis (i.e. aspirin) I told him that at his age, full dose aspirin and Eliquis together might be a very dangerous combination with a significant risk of bleeding.  I spent about 45 minutes with he and his wife.  Over 50% of the time was spent face-to-face talking with them.  I answered all their questions.  He served our country.  We will serve  him.  We will have him come back in 3 months and have the Doppler done the same day.

## 2017-08-30 ENCOUNTER — Other Ambulatory Visit: Payer: Self-pay | Admitting: Medical

## 2017-09-06 ENCOUNTER — Other Ambulatory Visit: Payer: Self-pay | Admitting: Medical

## 2017-10-16 ENCOUNTER — Telehealth: Payer: Self-pay | Admitting: Medical

## 2017-10-16 DIAGNOSIS — G2581 Restless legs syndrome: Secondary | ICD-10-CM

## 2017-10-16 DIAGNOSIS — Z79899 Other long term (current) drug therapy: Secondary | ICD-10-CM

## 2017-10-16 NOTE — Telephone Encounter (Signed)
I sent in a 5-day supply of tramadol.  MI correct that he does not have an updated contract?  Would you check on that Monday and let me know.  If his contract is up-to-date then I will send in larger quantity.  Please update me on that on Monday.

## 2017-10-16 NOTE — Telephone Encounter (Signed)
Refill Request: Tramadol  Last RX:04/16/17 2 refills Last OV: 08/04/2017 Next OV: None scheduled  UDS:10/29/2016 CSC:05/01/2016

## 2017-10-20 NOTE — Telephone Encounter (Signed)
Copied from [place] [id number]. Topic: Quick Communication - See Telephone Encounter >> Oct 20, 2017 10:52 AM Arletha Grippe wrote: CRM for notification. See Telephone encounter for: 10/20/17. Pt received traMADol (ULTRAM) 50 MG tablet, and said that he only got 10 tablets when he normally gets 60. Pt would like to have call back about this  Cb is [phone]

## 2017-10-20 NOTE — Telephone Encounter (Signed)
Limited rx of tramadol. Needed to know if his contract was up to date. He needs to come in and sign contract.  Then can give him larger supply.

## 2017-10-20 NOTE — Telephone Encounter (Signed)
Pt would liked to be called about to sign a contract for the medication, call to advise

## 2017-10-21 NOTE — Telephone Encounter (Signed)
Brandon Mason-- what is diagnosis for UDS and tramadol Rx? Then I can complete contract and UDS order. Spoke with pt and scheduled lab appt for tomorrow, 10/22/17 at 10:10am for UDS and contract.

## 2017-10-22 ENCOUNTER — Other Ambulatory Visit: Payer: Medicare Other

## 2017-10-22 DIAGNOSIS — Z79899 Other long term (current) drug therapy: Secondary | ICD-10-CM | POA: Diagnosis not present

## 2017-10-22 DIAGNOSIS — G2581 Restless legs syndrome: Secondary | ICD-10-CM | POA: Diagnosis not present

## 2017-10-22 NOTE — Telephone Encounter (Signed)
Order entered, Psychologist, occupational.

## 2017-10-22 NOTE — Telephone Encounter (Signed)
Restless leg syndrome reason for tramadol/uds.

## 2017-10-25 ENCOUNTER — Telehealth: Payer: Self-pay | Admitting: Medical

## 2017-10-25 LAB — PAIN MGMT, PROFILE 8 W/CONF, U
6 Acetylmorphine: NEGATIVE ng/mL (ref <10)
AMPHETAMINES: NEGATIVE ng/mL (ref <500)
Alcohol Metabolites: NEGATIVE ng/mL (ref <500)
Benzodiazepines: NEGATIVE ng/mL (ref <100)
Buprenorphine, Urine: NEGATIVE ng/mL (ref <5)
Buprenorphine: NEGATIVE ng/mL (ref <2)
COCAINE METABOLITE: NEGATIVE ng/mL (ref <150)
Creatinine: 241.9 mg/dL
MARIJUANA METABOLITE: NEGATIVE ng/mL (ref <20)
MDMA: NEGATIVE ng/mL (ref <500)
Norbuprenorphine: NEGATIVE ng/mL (ref <2)
OPIATES: NEGATIVE ng/mL (ref <100)
OXYCODONE: NEGATIVE ng/mL (ref <100)
Oxidant: NEGATIVE ug/mL (ref <200)
pH: 5.55 (ref 4.5–9.0)

## 2017-10-25 NOTE — Telephone Encounter (Signed)
Will you call patient pharmacy and see if he has any refills left  on the tramadol prescription. It looks like last prescription he had 2 refills. I never write refills on tramadol. Not sure how that happened. But I can't prescribe further refills until he goes through those refills.   Let me know by Monday please.

## 2017-10-27 MED ORDER — TRAMADOL HCL 50 MG PO TABS: 50.0000 mg | ORAL_TABLET | Freq: Two times a day (BID) | ORAL | 0 refills | Status: DC

## 2017-10-27 NOTE — Telephone Encounter (Signed)
Pharmacy states pt has one more refill for 10 tablet. Rx cancelled and 60 tabs bid sent in.

## 2017-10-27 NOTE — Addendum Note (Signed)
Addended by: Hinton Dyer on: 10/27/2017 09:32 AM   Modules accepted: Orders

## 2017-10-31 ENCOUNTER — Other Ambulatory Visit: Payer: Self-pay | Admitting: Medical

## 2017-11-11 ENCOUNTER — Other Ambulatory Visit: Payer: Self-pay | Admitting: Medical

## 2017-11-25 ENCOUNTER — Other Ambulatory Visit: Payer: Self-pay | Admitting: Medical

## 2017-11-25 DIAGNOSIS — G2581 Restless legs syndrome: Secondary | ICD-10-CM

## 2017-11-27 MED ORDER — TRAMADOL HCL 50 MG PO TABS: 50.0000 mg | ORAL_TABLET | Freq: Two times a day (BID) | ORAL | 0 refills | Status: DC

## 2017-11-27 NOTE — Telephone Encounter (Signed)
Requesting: Tramadol 50mg  bid Contract: yes UDS: 10/22/17 Last OV: 08/04/17 Next Ov: N/A Last refill: 10/27/17, #60, O RF Database: OD risk 180/999  Rx printed out, given to [employer]. Waiting for [employer], PA's signature.

## 2017-11-28 ENCOUNTER — Ambulatory Visit (HOSPITAL_BASED_OUTPATIENT_CLINIC_OR_DEPARTMENT_OTHER)
Admission: RE | Admit: 2017-11-28 | Discharge: 2017-11-28 | Disposition: A | Discharge status: Home / Self Care | Payer: Medicare Other | Source: Ambulatory Visit | Attending: Medical | Admitting: Medical

## 2017-11-28 ENCOUNTER — Telehealth: Payer: Self-pay | Admitting: Medical

## 2017-11-28 ENCOUNTER — Ambulatory Visit (INDEPENDENT_AMBULATORY_CARE_PROVIDER_SITE_OTHER): Payer: Medicare Other | Admitting: Medical

## 2017-11-28 ENCOUNTER — Encounter: Payer: Self-pay | Admitting: Medical

## 2017-11-28 ENCOUNTER — Telehealth: Payer: Self-pay | Admitting: *Deleted

## 2017-11-28 ENCOUNTER — Ambulatory Visit (HOSPITAL_BASED_OUTPATIENT_CLINIC_OR_DEPARTMENT_OTHER)
Admission: RE | Admit: 2017-11-28 | Discharge: 2017-11-28 | Disposition: A | Discharge status: Home / Self Care | Payer: Medicare Other | Source: Ambulatory Visit | Attending: Hematology & Oncology | Admitting: Hematology & Oncology

## 2017-11-28 VITALS — BP 131/72 | HR 79 | Temp 97.9°F | Resp 16 | Ht 68.0 in | Wt 179.4 lb

## 2017-11-28 DIAGNOSIS — I517 Cardiomegaly: Secondary | ICD-10-CM | POA: Diagnosis not present

## 2017-11-28 DIAGNOSIS — I82441 Acute embolism and thrombosis of right tibial vein: Secondary | ICD-10-CM | POA: Diagnosis not present

## 2017-11-28 DIAGNOSIS — R600 Localized edema: Secondary | ICD-10-CM

## 2017-11-28 DIAGNOSIS — Z86718 Personal history of other venous thrombosis and embolism: Secondary | ICD-10-CM

## 2017-11-28 DIAGNOSIS — R918 Other nonspecific abnormal finding of lung field: Secondary | ICD-10-CM | POA: Insufficient documentation

## 2017-11-28 DIAGNOSIS — M25571 Pain in right ankle and joints of right foot: Secondary | ICD-10-CM | POA: Diagnosis not present

## 2017-11-28 DIAGNOSIS — I82541 Chronic embolism and thrombosis of right tibial vein: Secondary | ICD-10-CM | POA: Insufficient documentation

## 2017-11-28 DIAGNOSIS — I82591 Chronic embolism and thrombosis of other specified deep vein of right lower extremity: Secondary | ICD-10-CM

## 2017-11-28 DIAGNOSIS — R0600 Dyspnea, unspecified: Secondary | ICD-10-CM | POA: Diagnosis not present

## 2017-11-28 DIAGNOSIS — J9811 Atelectasis: Secondary | ICD-10-CM | POA: Diagnosis not present

## 2017-11-28 LAB — COMPREHENSIVE METABOLIC PANEL
ALT: 9 U/L (ref 0–53)
AST: 11 U/L (ref 0–37)
Albumin: 4 g/dL (ref 3.5–5.2)
Alkaline Phosphatase: 60 U/L (ref 39–117)
BUN: 27 mg/dL — ABNORMAL HIGH (ref 6–23)
CALCIUM: 9.1 mg/dL (ref 8.4–10.5)
CHLORIDE: 108 meq/L (ref 96–112)
CO2: 30 meq/L (ref 19–32)
Creatinine, Ser: 1.08 mg/dL (ref 0.40–1.50)
GFR: 68.35 mL/min (ref >60.00)
GLUCOSE: 92 mg/dL (ref 70–99)
POTASSIUM: 4.1 meq/L (ref 3.5–5.1)
Sodium: 145 mEq/L (ref 135–145)
Total Bilirubin: 0.5 mg/dL (ref 0.2–1.2)
Total Protein: 6.8 g/dL (ref 6.0–8.3)

## 2017-11-28 LAB — BRAIN NATRIURETIC PEPTIDE: Pro B Natriuretic peptide (BNP): 24 pg/mL (ref 0.0–100.0)

## 2017-11-28 NOTE — Patient Instructions (Signed)
For your history of restless leg syndrome and neuropathy, I want you to continue current regimen.  I am refilling your tramadol today.  For your history of a right side DVT, I did call downstairs and they stated you can get your repeat ultrasound today.  Results will be sent to Dr. Marin Olp.  For recent low-level pedal edema and history of mild to moderate dyspnea on mild exertion, I want to get chest x-ray and labs.  Labs will include Bnp and CMP.  For your right ankle discomfort recently will get a right ankle x-ray.  If your restless leg syndrome and neuropathy type symptoms worsen in the future then could refer you back to neurologist.  We could refer you back to neurologist of your choice.  You mention seeing 2 and we could refer you to which ever you choose.  Follow-up date to be determined after lab review.

## 2017-11-28 NOTE — Telephone Encounter (Addendum)
Other physician office gave results of doppler   ----- Message from Volanda Napoleon, MD sent at 11/28/2017 12:26 PM EDT ----- Call - there is still the small blood clot in the right lower leg.  This is NOT larger.  You will always have this.  Laurey Arrow

## 2017-11-28 NOTE — Telephone Encounter (Signed)
Signed pt tramadol. Reviewed refill hx.

## 2017-11-28 NOTE — Telephone Encounter (Signed)
Will you send/fax the tramadol rx I signed. Then let him know he is due for follow up next month. Need to check his bp as well. Was high on last visit.

## 2017-11-28 NOTE — Progress Notes (Signed)
Subjective:    Patient ID: Brandon Mason, male    DOB: 1926-04-22, 84 y.o.   MRN: [id number]  HPI  Pt in for follow up.  Pt has questions on how long he will need to be on Eliquis for his DVT in  Rt posterior tibial vein. Dr. Marin Olp in note states 6-12 months. Advised repeat doppler in 3 months.   Pt has history of  Restless legs and some neuropathy. Pt states he is getting by with tramadol, gabapentin and requip. At times he feels like combination is not helping. Pt has seen 2 neurologist. One thought he had neuropathy. Other thought not. Both agree he has restless leg syndrome.  He is getting about 7 hours asleep at night. No cramping of his legs.   Slight rt ankle tightness. But no trauma. Also notes some feet swelling recently. But no shortness of breath. Weight has been stable. Only 2 lb up since last visit.  Review of Systems  Constitutional: Negative for chills, fatigue and fever.  Respiratory: Negative for chest tightness, shortness of breath and wheezing.        States years of feeling mild sob with minimal activity.  Cardiovascular: Negative for chest pain and palpitations.  Gastrointestinal: Negative for abdominal distention, blood in stool, diarrhea and nausea.  Neurological:       Restless legs.(probable neuropathy as well)  Hematological: Negative for adenopathy. Does not bruise/bleed easily.  Psychiatric/Behavioral: Negative for behavioral problems, confusion and suicidal ideas. The patient is not nervous/anxious.     Past Medical History:  Diagnosis Date  . Abnormality of gait 02/09/2013  . Arthritis   . Cancer ([place])    basal cell skin cancer  . Cardiomegaly   . Chronic kidney disease    BPH  . Chronic leg pain   . Hypersomnia   . Hypertension   . Hypoxia    pulmonary  . Neuromuscular disorder ([facility])    restless leg syndrome   . Nocturia   . Prostate disorder   . Restless legs syndrome (RLS) 02/09/2013  . Restless legs syndrome with nocturnal  myoclonus   . RLS (restless legs syndrome)    x 40 years  . Shortness of breath    with exertion   . Sleep apnea    uses cpap  . Stroke [facility])    minor stroke in 2003 - no lasting side effects  . UARS (upper airway resistance syndrome) 06/28/2014     Social History   Socioeconomic History  . Marital status: Married    Spouse name: [name]  . Number of children: 5  . Years of education: 19  . Highest education level: Not on file  Occupational History  . 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 Financial resource strain: Not on file  . Food insecurity:    Worry: Not on file    Inability: Not on file  . Transportation needs:    Medical: Not on file    Non-medical: Not on file  Tobacco Use  . Smoking status: Never Smoker  . Smokeless tobacco: Never Used  Substance and Sexual Activity  . Alcohol use: Yes    Alcohol/week: 1.2 oz    Types: 2 Cans of beer per week    Comment: weekly  . Drug use: No  . Sexual activity: Not on file  Lifestyle  . Physical activity:    Days per week: Not on file  Minutes per session: Not on file  . Stress: Not on file  Relationships  . Social connections:    Talks on phone: Not on file    Gets together: Not on file    Attends religious service: Not on file    Active member of club or organization: Not on file    Attends meetings of clubs or organizations: Not on file    Relationship status: Not on file  . Intimate partner violence:    Fear of current or ex partner: Not on file    Emotionally abused: Not on file    Physically abused: Not on file    Forced sexual activity: Not on file  Other Topics Concern  . Not on file  Social History Narrative   Patient lives at home with his wife [name].   Patient is retired.    Patient has 5 children.   Patient has a high school education.   Patient is right-handed.   Patient drinks very little caffeine.    Past Surgical History:  Procedure Laterality  Date  . ANTERIOR CERVICAL DECOMP/DISCECTOMY FUSION N/A 03/29/2016   Procedure: ANTERIOR CERVICAL DECOMPRESSION/DISCECTOMY FUSION CERVICAL THREE- CERVICAL FOUR;  Surgeon: Ashok Pall, MD;  Location: [place] [facility];  Service: Neurosurgery;  Laterality: N/A;  . BACK SURGERY  2003   L4-5  . CATARACT EXTRACTION Bilateral   . HERNIA REPAIR     inguinal hernia  . TRANSURETHRAL PROSTATECTOMY WITH GYRUS INSTRUMENTS  04/28/2012   Procedure: TRANSURETHRAL PROSTATECTOMY WITH GYRUS INSTRUMENTS;  Surgeon: Fredricka Bonine, MD;  Location: [facility];  Service: Urology;  Laterality: N/A;  . TRANSURETHRAL RESECTION OF PROSTATE  03/24/2012   Procedure: TRANSURETHRAL RESECTION OF THE PROSTATE (TURP);  Surgeon: Fredricka Bonine, MD;  Location: [facility];  Service: Urology;  Laterality: N/A;  with gyrus ,Greenlight PVP laser of Prostate    Family History  Problem Relation Age of Onset  . Restless legs syndrome Mother   . Restless legs syndrome Daughter   . Esophageal cancer Neg Hx   . Colon cancer Neg Hx   . Pancreatic cancer Neg Hx   . Stomach cancer Neg Hx     No Known Allergies  Current Outpatient Medications on File Prior to Visit  Medication Sig Dispense Refill  . amLODipine (NORVASC) 10 MG tablet TAKE 1 TABLET BY MOUTH ONCE DAILY 30 tablet 2  . ELIQUIS 5 MG TABS tablet TAKE 1 TABLET BY MOUTH TWICE DAILY 60 tablet 2  . gabapentin (NEURONTIN) 600 MG tablet Take 1 tablet (600 mg total) by mouth 3 (three) times daily. 270 tablet 1  . gabapentin (NEURONTIN) 600 MG tablet TAKE 1 TABLET BY MOUTH THREE TIMES DAILY 270 tablet 1  . hydrochlorothiazide (HYDRODIURIL) 25 MG tablet TAKE 1 TABLET BY MOUTH ONCE DAILY 30 tablet 0  . lisinopril (PRINIVIL,ZESTRIL) 20 MG tablet Take 1 tablet (20 mg total) by mouth daily. 90 tablet 1  . rOPINIRole (REQUIP) 4 MG tablet Take 1 tablet (4 mg total) by mouth at bedtime. 30 tablet 3  . traMADol (ULTRAM) 50 MG tablet Take 1 tablet (50 mg total) by mouth 2 (two) times  daily. 60 tablet 0   No current facility-administered medications on file prior to visit.     BP 131/72   Pulse 79   Temp 97.9 F (36.6 C) (Oral)   Resp 16   Ht 5\' 8"  (1.727 m)   Wt 179 lb 6.4 oz (81.4 kg)   SpO2 95%   BMI  27.28 kg/m       Objective:   Physical Exam  General Mental Status- Alert. General Appearance- Not in acute distress.   Skin General: Color- Normal Color. Moisture- Normal Moisture.  Neck Carotid Arteries- Normal color. Moisture- Normal Moisture. No carotid bruits. No JVD.  Chest and Lung Exam Auscultation: Breath Sounds:-Normal.  Cardiovascular Auscultation:Rythm- Regular. Murmurs & Other Heart Sounds:Auscultation of the heart reveals- No Murmurs.  Abdomen Inspection:-Inspeection Normal. Palpation/Percussion:Note:No mass. Palpation and Percussion of the abdomen reveal- Non Tender, Non Distended + BS, no rebound or guarding.    Neurologic Cranial Nerve exam:- CN III-XII intact(No nystagmus), symmetric smile. Strength:- 5/5 equal and symmetric strength both upper and lower extremities.  Lower ext- 1+ pedal edema distal pretibial area and feet. Bilateral. Negative homans signs bilaterally.  Rt ankle- faint sore on palpation. Symmetric edema/swelling. No redness. No induration.     Assessment & Plan:  For your history of restless leg syndrome and neuropathy, I want you to continue current regimen.  I am refilling your tramadol today.  For your history of a right side DVT, I did call downstairs and they stated you can get your repeat ultrasound today.  Results will be sent to Dr. Marin Olp.  For recent low-level pedal edema and history of mild to moderate dyspnea on mild exertion, I want to get chest x-ray and labs.  Labs will include Bnp and CMP.  For your right ankle discomfort recently will get a right ankle x-ray.  If your restless leg syndrome and neuropathy type symptoms worsen in the future then could refer you back to neurologist.  We  could refer you back to neurologist of your choice.  You mention seeing 2 and we could refer you to which ever you choose.  Follow-up date to be determined after lab review.  Mackie Pai, [name]

## 2017-12-01 ENCOUNTER — Other Ambulatory Visit: Payer: Self-pay | Admitting: Medical

## 2017-12-01 ENCOUNTER — Telehealth: Payer: Self-pay | Admitting: Medical

## 2017-12-01 NOTE — Telephone Encounter (Signed)
Pt given results. See result note

## 2017-12-01 NOTE — Telephone Encounter (Signed)
Returning call.

## 2017-12-01 NOTE — Telephone Encounter (Signed)
Copied from [place] [id number]. Topic: Quick Communication - Lab Results >> Dec 01, 2017  9:16 AM Hinton Dyer, CMA wrote: Called patient to inform them of 11/28/2017 lab results. When patient returns call, triage nurse may disclose results.

## 2017-12-02 NOTE — Telephone Encounter (Signed)
Author phoned pt. To relay 5/31 imaging results, but no answer. Author to reach out to patient later in the day.

## 2017-12-02 NOTE — Telephone Encounter (Signed)
-----   Message from Mackie Pai, [name] sent at 11/29/2017  2:58 PM EDT ----- Some heart enlargement but no other findings indicating heart failure. Would recommend using ted hose compression stockings. Can get those at most pharmacy or at [facility]. Appears swelling in legs is from dependent edema.If you get severe swelling, weight gain or shortness of breath then need to recheck again.

## 2017-12-27 ENCOUNTER — Other Ambulatory Visit: Payer: Self-pay | Admitting: Medical

## 2017-12-27 DIAGNOSIS — G2581 Restless legs syndrome: Secondary | ICD-10-CM

## 2017-12-29 ENCOUNTER — Ambulatory Visit: Payer: Self-pay | Admitting: *Deleted

## 2017-12-29 NOTE — Telephone Encounter (Signed)
Pt states that he has worsening restless leg syndrome even with taking current prescribed medications. Pt states for about a month it has been worse than usual. Notes from [place] 11/28/17 with Mackie Pai, PA states that the pt could be referred back to neurologist. Pt states he is willing to be referred to urologist or wants to know if a different medication could be prescribed.

## 2017-12-29 NOTE — Telephone Encounter (Signed)
Refill Request:Tramadol  Last RX: 11/27/17 Last OV: 11/28/17 Next OV: None scheduled  UDS: 10/22/17 CSC:  10/22/17

## 2017-12-29 NOTE — Telephone Encounter (Signed)
  Reason for Disposition . [1] Caller requests to speak ONLY to PCP AND [2] NON-URGENT question  Answer Assessment - Initial Assessment Questions 1. REASON FOR CALL or QUESTION: "What is your reason for calling today?" or "How can I best help you?" or "What question do you have that I can help answer?"     Worsening restless legs 2. CALLER: Document the source of call. (e.g., laboratory, patient).     Patient  Protocols used: PCP CALL - NO TRIAGE-A-AH

## 2017-12-29 NOTE — Telephone Encounter (Signed)
Rx tramadol sent to pharmacy today.

## 2017-12-30 ENCOUNTER — Telehealth: Payer: Self-pay | Admitting: Medical

## 2017-12-30 DIAGNOSIS — D649 Anemia, unspecified: Secondary | ICD-10-CM

## 2017-12-30 NOTE — Telephone Encounter (Signed)
Future labs ordered to evaluate anemia and see if low iron. Pt has severe rls and seeing if low iron maybe potential cause(some studies indicate conncection low iron and rls). Will notify pt call him back today as he left message for me earlier.

## 2017-12-30 NOTE — Telephone Encounter (Signed)
Did talk with pharmacist and explained to him that will instruct pt to take tramadol tid. He will run out early. He states ok just specify increased dose on next script. Will notify pt/call him back. He also will come in for labs on Friday as other message noted.

## 2017-12-30 NOTE — Telephone Encounter (Signed)
Averette, pharamacist calling to let Saguier know he can change the dosage today if he would like instead of wait until the patients next script.

## 2017-12-30 NOTE — Telephone Encounter (Signed)
See telephone notes. Called pt today.

## 2017-12-31 ENCOUNTER — Other Ambulatory Visit (INDEPENDENT_AMBULATORY_CARE_PROVIDER_SITE_OTHER): Payer: Medicare Other

## 2017-12-31 ENCOUNTER — Telehealth: Payer: Self-pay | Admitting: Medical

## 2017-12-31 DIAGNOSIS — G2581 Restless legs syndrome: Secondary | ICD-10-CM

## 2017-12-31 DIAGNOSIS — D649 Anemia, unspecified: Secondary | ICD-10-CM | POA: Diagnosis not present

## 2017-12-31 LAB — CBC WITH DIFFERENTIAL/PLATELET
BASOS ABS: 0 10*3/uL (ref 0.0–0.1)
Basophils Relative: 0.5 % (ref 0.0–3.0)
Eosinophils Absolute: 0.1 10*3/uL (ref 0.0–0.7)
Eosinophils Relative: 1.9 % (ref 0.0–5.0)
HCT: 39.7 % (ref 39.0–52.0)
Hemoglobin: 13.3 g/dL (ref 13.0–17.0)
Lymphocytes Relative: 24.7 % (ref 12.0–46.0)
Lymphs Abs: 1.4 10*3/uL (ref 0.7–4.0)
MCHC: 33.5 g/dL (ref 30.0–36.0)
MCV: 99.8 fl (ref 78.0–100.0)
Monocytes Absolute: 0.6 10*3/uL (ref 0.1–1.0)
Monocytes Relative: 10.7 % (ref 3.0–12.0)
NEUTROS ABS: 3.6 10*3/uL (ref 1.4–7.7)
NEUTROS PCT: 62.2 % (ref 43.0–77.0)
PLATELETS: 280 10*3/uL (ref 150.0–400.0)
RBC: 3.98 Mil/uL — AB (ref 4.22–5.81)
RDW: 12.7 % (ref 11.5–15.5)
WBC: 5.8 10*3/uL (ref 4.0–10.5)

## 2017-12-31 LAB — FOLATE: FOLATE: 14.6 ng/mL (ref >5.9)

## 2017-12-31 LAB — FERRITIN: Ferritin: 179.3 ng/mL (ref 22.0–322.0)

## 2017-12-31 MED ORDER — TRAMADOL HCL 50 MG PO TABS: 50.0000 mg | ORAL_TABLET | Freq: Three times a day (TID) | ORAL | 0 refills | Status: DC | PRN

## 2017-12-31 NOTE — Telephone Encounter (Signed)
Resent tramadol. Changed sig. Pharmacist advise to go ahead and send in new rx of tramadol.

## 2017-12-31 NOTE — Addendum Note (Signed)
Addended by: Harl Bowie on: 12/31/2017 12:59 PM   Modules accepted: Orders

## 2018-01-01 LAB — IRON AND TIBC
IRON SATURATION: 39 % (ref 15–55)
IRON: 112 ug/dL (ref 38–169)
Total Iron Binding Capacity: 284 ug/dL (ref 250–450)
UIBC: 172 ug/dL (ref 111–343)

## 2018-01-12 ENCOUNTER — Other Ambulatory Visit: Payer: Self-pay | Admitting: Medical

## 2018-01-18 ENCOUNTER — Other Ambulatory Visit: Payer: Self-pay | Admitting: Medical

## 2018-01-20 ENCOUNTER — Inpatient Hospital Stay: Payer: Medicare Other

## 2018-01-20 ENCOUNTER — Inpatient Hospital Stay: Payer: Medicare Other | Attending: Family | Admitting: Family

## 2018-01-20 ENCOUNTER — Other Ambulatory Visit: Payer: Self-pay

## 2018-01-20 ENCOUNTER — Encounter: Payer: Self-pay | Admitting: Family

## 2018-01-20 VITALS — BP 137/72 | HR 79 | Temp 98.3°F | Resp 17 | Wt 185.4 lb

## 2018-01-20 DIAGNOSIS — Z86718 Personal history of other venous thrombosis and embolism: Secondary | ICD-10-CM | POA: Diagnosis not present

## 2018-01-20 DIAGNOSIS — Z7901 Long term (current) use of anticoagulants: Secondary | ICD-10-CM

## 2018-01-20 DIAGNOSIS — Z79899 Other long term (current) drug therapy: Secondary | ICD-10-CM | POA: Insufficient documentation

## 2018-01-20 DIAGNOSIS — M7989 Other specified soft tissue disorders: Secondary | ICD-10-CM | POA: Insufficient documentation

## 2018-01-20 DIAGNOSIS — I82591 Chronic embolism and thrombosis of other specified deep vein of right lower extremity: Secondary | ICD-10-CM

## 2018-01-20 DIAGNOSIS — R0602 Shortness of breath: Secondary | ICD-10-CM | POA: Diagnosis not present

## 2018-01-20 LAB — CMP (CANCER CENTER ONLY)
ALBUMIN: 3.9 g/dL (ref 3.5–5.0)
ALK PHOS: 66 U/L (ref 38–126)
ALT: 12 U/L (ref 0–44)
ANION GAP: 8 (ref 5–15)
AST: 15 U/L (ref 15–41)
BILIRUBIN TOTAL: 0.4 mg/dL (ref 0.3–1.2)
BUN: 19 mg/dL (ref 8–23)
CALCIUM: 9 mg/dL (ref 8.9–10.3)
CO2: 26 mmol/L (ref 22–32)
Chloride: 107 mmol/L (ref 98–111)
Creatinine: 0.9 mg/dL (ref 0.61–1.24)
GFR, Est AFR Am: >60 mL/min (ref >60)
GLUCOSE: 113 mg/dL — AB (ref 70–99)
Potassium: 3.6 mmol/L (ref 3.5–5.1)
Sodium: 141 mmol/L (ref 135–145)
TOTAL PROTEIN: 7 g/dL (ref 6.5–8.1)

## 2018-01-20 LAB — CBC WITH DIFFERENTIAL (CANCER CENTER ONLY)
BASOS PCT: 1 %
Basophils Absolute: 0 10*3/uL (ref 0.0–0.1)
Eosinophils Absolute: 0.1 10*3/uL (ref 0.0–0.5)
Eosinophils Relative: 1 %
HEMATOCRIT: 38.1 % — AB (ref 38.7–49.9)
HEMOGLOBIN: 12.7 g/dL — AB (ref 13.0–17.1)
LYMPHS ABS: 1.6 10*3/uL (ref 0.9–3.3)
LYMPHS PCT: 25 %
MCH: 33.4 pg (ref 28.0–33.4)
MCHC: 33.3 g/dL (ref 32.0–35.9)
MCV: 100.3 fL — AB (ref 82.0–98.0)
MONOS PCT: 8 %
Monocytes Absolute: 0.5 10*3/uL (ref 0.1–0.9)
NEUTROS ABS: 4.2 10*3/uL (ref 1.5–6.5)
NEUTROS PCT: 65 %
Platelet Count: 253 10*3/uL (ref 145–400)
RBC: 3.8 MIL/uL — ABNORMAL LOW (ref 4.20–5.70)
RDW: 12.2 % (ref 11.1–15.7)
WBC Count: 6.4 10*3/uL (ref 4.0–10.0)

## 2018-01-20 NOTE — Progress Notes (Signed)
Hematology and Oncology Follow Up Visit  Brandon Mason [id number] [birth year] 84 y.o. 01/20/2018   Principle Diagnosis:  DVT of right lower extremity   Current Therapy:   Eliquis 5 mg PO BID - will complete one full year in February 2020   Interim History: [name] is here today for follow-up. He is doing well and staying busy around the house and going to water aerobics.  He is tolerating Eliquis nicely and verbalized he is taking as prescribed. He has had no episodes of bleeding. His skin is thin and he does bruise easily.  He has some SOB with over exertion and will take breaks to rest as needed.  He has chronic swelling in his lower extremities and takes HCTZ daily. The numbness and tingling in his legs is unchanged. No c/o pain.  No fever, chills, n/v, cough, rash, dizziness, chest pain, palpitations, abdominal pain or changes in bowel or bladder habits.  He has a good appetite and is staying well hydrated. His weight is stable.   ECOG Performance Status: 1 - Symptomatic but completely ambulatory  Medications:  Allergies as of 01/20/2018   No Known Allergies     Medication List        Accurate as of 01/20/18  2:40 PM. Always use your most recent med list.          amLODipine 10 MG tablet Commonly known as:  NORVASC TAKE 1 TABLET BY MOUTH ONCE DAILY   ELIQUIS 5 MG Tabs tablet Generic drug:  apixaban TAKE 1 TABLET BY MOUTH TWICE DAILY   gabapentin 600 MG tablet Commonly known as:  NEURONTIN TAKE 1 TABLET BY MOUTH THREE TIMES DAILY   hydrochlorothiazide 25 MG tablet Commonly known as:  HYDRODIURIL TAKE 1 TABLET BY MOUTH ONCE DAILY   lisinopril 20 MG tablet Commonly known as:  PRINIVIL,ZESTRIL TAKE 1 TABLET BY MOUTH ONCE DAILY   rOPINIRole 4 MG tablet Commonly known as:  REQUIP TAKE 1 TABLET BY MOUTH AT BEDTIME   traMADol 50 MG tablet Commonly known as:  ULTRAM Take 1 tablet (50 mg total) by mouth every 8 (eight) hours as needed. increased frequency       Allergies: No Known Allergies  Past Medical History, Surgical history, Social history, and Family History were reviewed and updated.  Review of Systems: All other 10 point review of systems is negative.   Physical Exam:  weight is 185 lb 6.4 oz (84.1 kg). His oral temperature is 98.3 F (36.8 C). His blood pressure is 137/72 and his pulse is 79. His respiration is 17 and oxygen saturation is 96%.   Wt Readings from Last 3 Encounters:  01/20/18 185 lb 6.4 oz (84.1 kg)  11/28/17 179 lb 6.4 oz (81.4 kg)  08/21/17 177 lb (80.3 kg)    Ocular: Sclerae unicteric, pupils equal, round and reactive to light Ear-nose-throat: Oropharynx clear, dentition fair Lymphatic: No cervical, supraclavicular or axillary adenopathy Lungs no rales or rhonchi, good excursion bilaterally Heart regular rate and rhythm, no murmur appreciated Abd soft, nontender, positive bowel sounds, no liver or spleen tip palpated on exam, no fluid wave  MSK no focal spinal tenderness, no joint edema Neuro: non-focal, well-oriented, appropriate affect Breasts: Deferred   Lab Results  Component Value Date   WBC 6.4 01/20/2018   HGB 12.7 (L) 01/20/2018   HCT 38.1 (L) 01/20/2018   MCV 100.3 (H) 01/20/2018   PLT 253 01/20/2018   Lab Results  Component Value Date   FERRITIN 179.3 12/31/2017  IRON 112 12/31/2017   TIBC 284 12/31/2017   UIBC 172 12/31/2017   IRONPCTSAT 39 12/31/2017   Lab Results  Component Value Date   RBC 3.80 (L) 01/20/2018   No results found for: Nils Pyle [facility] Lab Results  Component Value Date   IGGSERUM 1,220 09/13/2015   IGA 244 09/13/2015   IGMSERUM 59 09/13/2015   Lab Results  Component Value Date   TOTALPROTELP 6.5 09/13/2015   ALBUMINELP 3.7 (L) 09/13/2015   A1GS 0.4 (H) 09/13/2015   A2GS 0.7 09/13/2015   BETS 0.4 09/13/2015   BETA2SER 0.3 09/13/2015   GAMS 1.0 09/13/2015   MSPIKE Not Observed 05/14/2013   SPEI SEE NOTE 09/13/2015     Chemistry       Component Value Date/Time   NA 145 11/28/2017 0940   K 4.1 11/28/2017 0940   CL 108 11/28/2017 0940   CO2 30 11/28/2017 0940   BUN 27 (H) 11/28/2017 0940   CREATININE 1.08 11/28/2017 0940   CREATININE 1.30 (H) 08/21/2017 1332      Component Value Date/Time   CALCIUM 9.1 11/28/2017 0940   ALKPHOS 60 11/28/2017 0940   AST 11 11/28/2017 0940   AST 22 08/21/2017 1332   ALT 9 11/28/2017 0940   ALT 18 08/21/2017 1332   BILITOT 0.5 11/28/2017 0940   BILITOT 0.9 08/21/2017 1332      Impression and Plan: Brandon Mason is a very pleasant 84 yo caucasian gentleman with right lower extremity thrombus, 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 He has tolerated treatment with Eliquis nicely and has no complaints at this time.  He really does not like to come in frequently for visits. We will see when Dr. Marin Olp returns when he wants to see him back and if he can start full dose aspirin.  He will contact our office with any questions or concerns. We can certainly see her sooner if need be.   Laverna Peace, NP 7/23/20192:40 PM

## 2018-01-23 ENCOUNTER — Telehealth: Payer: Self-pay | Admitting: Hematology & Oncology

## 2018-01-23 NOTE — Telephone Encounter (Signed)
Unable to lmom to Iform pt of resch appointments. Mailed calender and letter to pt. Rescheduled appt to 8/2/ at 1130 am per sch msg

## 2018-01-24 ENCOUNTER — Other Ambulatory Visit: Payer: Self-pay | Admitting: Medical

## 2018-01-24 DIAGNOSIS — G2581 Restless legs syndrome: Secondary | ICD-10-CM

## 2018-01-27 MED ORDER — TRAMADOL HCL 50 MG PO TABS: 50.0000 mg | ORAL_TABLET | Freq: Three times a day (TID) | ORAL | 0 refills | Status: DC | PRN

## 2018-01-27 NOTE — Telephone Encounter (Signed)
Refilled pt tramadol.

## 2018-01-27 NOTE — Telephone Encounter (Signed)
Refill Request: Tramadol  Last RX:12/31/17 Last OV:11/28/17 Next OV: None scheduled  UDS: 10/22/17 CSC:10/22/17

## 2018-01-31 ENCOUNTER — Other Ambulatory Visit: Payer: Self-pay | Admitting: Medical

## 2018-02-23 ENCOUNTER — Ambulatory Visit (HOSPITAL_BASED_OUTPATIENT_CLINIC_OR_DEPARTMENT_OTHER)
Admission: RE | Admit: 2018-02-23 | Discharge: 2018-02-23 | Disposition: A | Discharge status: Home / Self Care | Payer: Medicare Other | Source: Ambulatory Visit | Attending: Medical | Admitting: Medical

## 2018-02-23 ENCOUNTER — Encounter: Payer: Self-pay | Admitting: Medical

## 2018-02-23 ENCOUNTER — Ambulatory Visit (INDEPENDENT_AMBULATORY_CARE_PROVIDER_SITE_OTHER): Payer: Medicare Other | Admitting: Medical

## 2018-02-23 ENCOUNTER — Telehealth: Payer: Self-pay | Admitting: Medical

## 2018-02-23 VITALS — BP 132/72 | HR 73 | Temp 97.7°F | Resp 16 | Ht 68.0 in | Wt 183.6 lb

## 2018-02-23 DIAGNOSIS — I82441 Acute embolism and thrombosis of right tibial vein: Secondary | ICD-10-CM | POA: Insufficient documentation

## 2018-02-23 DIAGNOSIS — R600 Localized edema: Secondary | ICD-10-CM | POA: Diagnosis not present

## 2018-02-23 DIAGNOSIS — G2581 Restless legs syndrome: Secondary | ICD-10-CM | POA: Diagnosis not present

## 2018-02-23 DIAGNOSIS — Z86718 Personal history of other venous thrombosis and embolism: Secondary | ICD-10-CM

## 2018-02-23 DIAGNOSIS — R918 Other nonspecific abnormal finding of lung field: Secondary | ICD-10-CM | POA: Insufficient documentation

## 2018-02-23 DIAGNOSIS — G629 Polyneuropathy, unspecified: Secondary | ICD-10-CM

## 2018-02-23 DIAGNOSIS — R0600 Dyspnea, unspecified: Secondary | ICD-10-CM | POA: Insufficient documentation

## 2018-02-23 LAB — COMPREHENSIVE METABOLIC PANEL
ALT: 12 U/L (ref 0–53)
AST: 13 U/L (ref 0–37)
Albumin: 4.3 g/dL (ref 3.5–5.2)
Alkaline Phosphatase: 65 U/L (ref 39–117)
BUN: 31 mg/dL — ABNORMAL HIGH (ref 6–23)
CO2: 31 meq/L (ref 19–32)
Calcium: 9.7 mg/dL (ref 8.4–10.5)
Chloride: 105 mEq/L (ref 96–112)
Creatinine, Ser: 1.11 mg/dL (ref 0.40–1.50)
GFR: 66.19 mL/min (ref >60.00)
GLUCOSE: 103 mg/dL — AB (ref 70–99)
POTASSIUM: 4.9 meq/L (ref 3.5–5.1)
SODIUM: 142 meq/L (ref 135–145)
Total Bilirubin: 0.7 mg/dL (ref 0.2–1.2)
Total Protein: 7.3 g/dL (ref 6.0–8.3)

## 2018-02-23 LAB — CBC WITH DIFFERENTIAL/PLATELET
BASOS ABS: 0 10*3/uL (ref 0.0–0.1)
Basophils Relative: 0.8 % (ref 0.0–3.0)
EOS PCT: 1.4 % (ref 0.0–5.0)
Eosinophils Absolute: 0.1 10*3/uL (ref 0.0–0.7)
HCT: 39.7 % (ref 39.0–52.0)
Hemoglobin: 13.4 g/dL (ref 13.0–17.0)
LYMPHS ABS: 1.5 10*3/uL (ref 0.7–4.0)
Lymphocytes Relative: 27.8 % (ref 12.0–46.0)
MCHC: 33.7 g/dL (ref 30.0–36.0)
MCV: 100.1 fl — AB (ref 78.0–100.0)
MONOS PCT: 10.5 % (ref 3.0–12.0)
Monocytes Absolute: 0.6 10*3/uL (ref 0.1–1.0)
NEUTROS ABS: 3.1 10*3/uL (ref 1.4–7.7)
NEUTROS PCT: 59.5 % (ref 43.0–77.0)
Platelets: 253 10*3/uL (ref 150.0–400.0)
RBC: 3.96 Mil/uL — ABNORMAL LOW (ref 4.22–5.81)
RDW: 13.2 % (ref 11.5–15.5)
WBC: 5.2 10*3/uL (ref 4.0–10.5)

## 2018-02-23 LAB — BRAIN NATRIURETIC PEPTIDE: Pro B Natriuretic peptide (BNP): 39 pg/mL (ref 0.0–100.0)

## 2018-02-23 LAB — TROPONIN I: TNIDX: 0.02 ug/l (ref 0.00–0.06)

## 2018-02-23 NOTE — Progress Notes (Signed)
Subjective:    Patient ID: Brandon Mason, male    DOB: [birth year], 84 y.o.   MRN: [id number]  HPI Pt in for follow up.  Pt states his leg feels weak and trembling for a month or more. Still having restless leg symptoms but those are relatively controlled. Pt does not that his restless leg symptoms are improved with tramadol up to 3 times a day.  Pt is reporting some burning to his legs.   Pt does have some swelling of both legs. He states he had swelling in past. On old May 2019 note it states he had 1+ pedal of left lower ext. Pt is still on Eliquis he was supposed to be on medication for at least one year. He is just short of a year per pt. Report.  Pt denies any cramping to his legs.   Pt did have some shortness of breath around 2 pm yesterday afternoon. He was taking a nap and during the name was dreaming about having shortness of breath. When he woke up he felt short of breath and then after 2 minutes shortness or breath resolved.   Review of Systems  Constitutional: Negative for chills, fatigue and fever.  Respiratory: Negative for choking, chest tightness, shortness of breath and wheezing.   Cardiovascular: Negative for chest pain and palpitations.  Gastrointestinal: Negative for abdominal pain.  Musculoskeletal: Negative for back pain.       Legs feel weak  Skin: Negative for rash.  Neurological: Negative for dizziness, speech difficulty, weakness, numbness and headaches.  Hematological: Negative for adenopathy. Does not bruise/bleed easily.  Psychiatric/Behavioral: Negative for behavioral problems and confusion.   Past Medical History:  Diagnosis Date  . Abnormality of gait 02/09/2013  . Arthritis   . Cancer ([place])    basal cell skin cancer  . Cardiomegaly   . Chronic kidney disease    BPH  . Chronic leg pain   . Hypersomnia   . Hypertension   . Hypoxia    pulmonary  . Neuromuscular disorder (HCC)    restless leg syndrome   . Nocturia   . Prostate disorder     . Restless legs syndrome (RLS) 02/09/2013  . Restless legs syndrome with nocturnal myoclonus   . RLS (restless legs syndrome)    x 40 years  . Shortness of breath    with exertion   . Sleep apnea    uses cpap  . Stroke [facility])    minor stroke in 2003 - no lasting side effects  . UARS (upper airway resistance syndrome) 06/28/2014     Social History   Socioeconomic History  . Marital status: Married    Spouse name: [name]  . Number of children: 5  . Years of education: 64  . Highest education level: Not on file  Occupational History  . 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 Financial resource strain: Not on file  . Food insecurity:    Worry: Not on file    Inability: Not on file  . Transportation needs:    Medical: Not on file    Non-medical: Not on file  Tobacco Use  . Smoking status: Never Smoker  . Smokeless tobacco: Never Used  Substance and Sexual Activity  . Alcohol use: Yes    Alcohol/week: 2.0 standard drinks    Types: 2 Cans of beer per week    Comment: weekly  . Drug use: No  .  Sexual activity: Not on file  Lifestyle  . Physical activity:    Days per week: Not on file    Minutes per session: Not on file  . Stress: Not on file  Relationships  . Social connections:    Talks on phone: Not on file    Gets together: Not on file    Attends religious service: Not on file    Active member of club or organization: Not on file    Attends meetings of clubs or organizations: Not on file    Relationship status: Not on file  . Intimate partner violence:    Fear of current or ex partner: Not on file    Emotionally abused: Not on file    Physically abused: Not on file    Forced sexual activity: Not on file  Other Topics Concern  . Not on file  Social History Narrative   Patient lives at home with his wife [name].   Patient is retired.    Patient has 5 children.   Patient has a high school education.   Patient is  right-handed.   Patient drinks very little caffeine.    Past Surgical History:  Procedure Laterality Date  . ANTERIOR CERVICAL DECOMP/DISCECTOMY FUSION N/A 03/29/2016   Procedure: ANTERIOR CERVICAL DECOMPRESSION/DISCECTOMY FUSION CERVICAL THREE- CERVICAL FOUR;  Surgeon: Ashok Pall, MD;  Location: [place] [facility];  Service: Neurosurgery;  Laterality: N/A;  . BACK SURGERY  2003   L4-5  . CATARACT EXTRACTION Bilateral   . HERNIA REPAIR     inguinal hernia  . TRANSURETHRAL PROSTATECTOMY WITH GYRUS INSTRUMENTS  04/28/2012   Procedure: TRANSURETHRAL PROSTATECTOMY WITH GYRUS INSTRUMENTS;  Surgeon: Fredricka Bonine, MD;  Location: [facility];  Service: Urology;  Laterality: N/A;  . TRANSURETHRAL RESECTION OF PROSTATE  03/24/2012   Procedure: TRANSURETHRAL RESECTION OF THE PROSTATE (TURP);  Surgeon: Fredricka Bonine, MD;  Location: [facility];  Service: Urology;  Laterality: N/A;  with gyrus ,Greenlight PVP laser of Prostate    Family History  Problem Relation Age of Onset  . Restless legs syndrome Mother   . Restless legs syndrome Daughter   . Esophageal cancer Neg Hx   . Colon cancer Neg Hx   . Pancreatic cancer Neg Hx   . Stomach cancer Neg Hx     No Known Allergies  Current Outpatient Medications on File Prior to Visit  Medication Sig Dispense Refill  . amLODipine (NORVASC) 10 MG tablet TAKE 1 TABLET BY MOUTH ONCE DAILY 90 tablet 0  . ELIQUIS 5 MG TABS tablet TAKE 1 TABLET BY MOUTH TWICE DAILY 60 tablet 2  . gabapentin (NEURONTIN) 600 MG tablet TAKE 1 TABLET BY MOUTH THREE TIMES DAILY 270 tablet 1  . hydrochlorothiazide (HYDRODIURIL) 25 MG tablet TAKE 1 TABLET BY MOUTH ONCE DAILY 30 tablet 0  . lisinopril (PRINIVIL,ZESTRIL) 20 MG tablet TAKE 1 TABLET BY MOUTH ONCE DAILY 90 tablet 1  . rOPINIRole (REQUIP) 4 MG tablet TAKE 1 TABLET BY MOUTH AT BEDTIME 30 tablet 3  . traMADol (ULTRAM) 50 MG tablet Take 1 tablet (50 mg total) by mouth every 8 (eight) hours as needed. increased  frequency 90 tablet 0   No current facility-administered medications on file prior to visit.     BP 132/72   Pulse 73   Temp 97.7 F (36.5 C) (Oral)   Resp 16   Ht 5\' 8"  (1.727 m)   Wt 183 lb 9.6 oz (83.3 kg)   SpO2 96%   BMI  27.92 kg/m       Objective:   Physical Exam  General Mental Status- Alert. General Appearance- Not in acute distress.   Skin General: Color- Normal Color. Moisture- Normal Moisture.  Neck Carotid Arteries- Normal color. Moisture- Normal Moisture. No carotid bruits. No JVD.  Chest and Lung Exam Auscultation: Breath Sounds:-Normal.  Cardiovascular Auscultation:Rythm- Regular. Murmurs & Other Heart Sounds:Auscultation of the heart reveals- No Murmurs.  Abdomen Inspection:-Inspeection Normal. Palpation/Percussion:Note:No mass. Palpation and Percussion of the abdomen reveal- Non Tender, Non Distended + BS, no rebound or guarding.   Neurologic Cranial Nerve exam:- CN III-XII intact(No nystagmus), symmetric smile. Strength:- 5/5 equal and symmetric strength both upper and lower extremities.  Lower ext- bilateral 2+ pedal edema. Negative homans signs.      Assessment & Plan:  For your recent pedal edema, leg weakness, and dyspnea we will get cbc, cmp, bnp and troponin.  Also want to get chest x-ray now.  EKG showed sinusy rhythm, with left axis bifasicular block. This was similar to ekg done in ED May 2018.   Due to history of dvt decided to get bilateral lower extremity ultrasounds to make sure no extension of previous DVT.  This might be beneficial in determining whether or not specialist wants you to continue Eliquis for additional 3 months.  Ultrasound studies are scheduled today at 4:30 in the afternoon.  If the above work-up is negative then considering getting ankle-brachial index studies.  Also for extremity weakness might consider neurology referral as well.  But we need to make sure that above signs symptoms not CHF  related.  Follow-up date to be determined after review of labs and imaging studies.  Mackie Pai, [name]

## 2018-02-23 NOTE — Telephone Encounter (Signed)
Copied from [place]. Topic: Referral - Request >> Feb 23, 2018  8:32 AM Reyne Dumas L wrote: CRM for notification. See Telephone encounter for: 02/23/18.  Pt would like a referral to someone.  Pt states his legs have been burning and seem to be getting weak.  Pt can be reached at [phone]

## 2018-02-23 NOTE — Patient Instructions (Addendum)
For your recent pedal edema, leg weakness, and dyspnea we will get cbc, cmp, bnp and troponin.  Also want to get chest x-ray now.  EKG showed sinusy rhythm, with left axis bifasicular block. This was similar to ekg done in ED May 2018.     Due to history of dvt decided to get bilateral lower extremity ultrasounds to make sure no extension of previous DVT.  This might be beneficial in determining whether or not specialist wants you to continue Eliquis for additional 3 months.  Ultrasound studies are scheduled today at 4:30 in the afternoon.  If the above work-up is negative then considering getting ankle-brachial index studies.  Also for extremity weakness might consider neurology referral as well.  But we need to make sure that above signs symptoms not CHF related.  Follow-up date to be determined after review of labs and imaging studies.

## 2018-02-23 NOTE — Telephone Encounter (Signed)
Appointment scheduled with E. Saguier for referral for legs burning.

## 2018-02-25 ENCOUNTER — Inpatient Hospital Stay: Payer: Medicare Other | Attending: Family | Admitting: Hematology & Oncology

## 2018-02-25 ENCOUNTER — Encounter: Payer: Self-pay | Admitting: Hematology & Oncology

## 2018-02-25 ENCOUNTER — Other Ambulatory Visit: Payer: Self-pay

## 2018-02-25 ENCOUNTER — Inpatient Hospital Stay: Payer: Medicare Other

## 2018-02-25 VITALS — BP 143/79 | HR 81 | Temp 98.2°F | Resp 20 | Wt 183.0 lb

## 2018-02-25 DIAGNOSIS — Z7901 Long term (current) use of anticoagulants: Secondary | ICD-10-CM | POA: Diagnosis not present

## 2018-02-25 DIAGNOSIS — I82591 Chronic embolism and thrombosis of other specified deep vein of right lower extremity: Secondary | ICD-10-CM

## 2018-02-25 DIAGNOSIS — G2581 Restless legs syndrome: Secondary | ICD-10-CM | POA: Diagnosis not present

## 2018-02-25 DIAGNOSIS — Z79899 Other long term (current) drug therapy: Secondary | ICD-10-CM | POA: Insufficient documentation

## 2018-02-25 DIAGNOSIS — I82541 Chronic embolism and thrombosis of right tibial vein: Secondary | ICD-10-CM | POA: Diagnosis not present

## 2018-02-25 DIAGNOSIS — G253 Myoclonus: Secondary | ICD-10-CM

## 2018-02-25 DIAGNOSIS — M7989 Other specified soft tissue disorders: Secondary | ICD-10-CM | POA: Insufficient documentation

## 2018-02-25 NOTE — Progress Notes (Signed)
Hematology and Oncology Follow Up Visit  Brandon Mason [id number] [birth year] 84 y.o. 02/25/2018   Principle Diagnosis:  DVT of right lower extremity   Current Therapy:   Eliquis 5 mg PO BID - will complete one full year in January 2020   Interim History: Brandon Mason is here today for follow-up.  Unfortunately, he is really not doing too well.  He is having problems with his legs.  He has restless leg syndrome.  He has some chronic swelling in his legs.  I told him that I would think that the amlodipine that he is on might be the one that is causing him to have some leg swelling.  He did have a Doppler of his legs a couple days ago.  The Doppler showed no acute thrombus.  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  I told him that he will always have this.  He wants to get off the Eliquis.  I told him that I really thought that one year would be better for him.  He does not like wearing compression stockings.  He says that it hurts his legs.  He has had no bleeding.  He has had no change in bowel or bladder habits.  He has had no rashes.  He has had no fever.  He has had no cough or chest wall pain.  Overall, his performance status is ECOG 1-2.   Medications:  Allergies as of 02/25/2018   No Known Allergies     Medication List        Accurate as of 02/25/18 12:55 PM. Always use your most recent med list.          amLODipine 10 MG tablet Commonly known as:  NORVASC TAKE 1 TABLET BY MOUTH ONCE DAILY   ELIQUIS 5 MG Tabs tablet Generic drug:  apixaban TAKE 1 TABLET BY MOUTH TWICE DAILY   gabapentin 600 MG tablet Commonly known as:  NEURONTIN TAKE 1 TABLET BY MOUTH THREE TIMES DAILY   hydrochlorothiazide 25 MG tablet Commonly known as:  HYDRODIURIL TAKE 1 TABLET BY MOUTH ONCE DAILY   lisinopril 20 MG tablet Commonly known as:  PRINIVIL,ZESTRIL TAKE 1 TABLET BY MOUTH ONCE DAILY   rOPINIRole 4 MG tablet Commonly known as:  REQUIP TAKE 1 TABLET BY  MOUTH AT BEDTIME   traMADol 50 MG tablet Commonly known as:  ULTRAM Take 1 tablet (50 mg total) by mouth every 8 (eight) hours as needed. increased frequency       Allergies: No Known Allergies  Past Medical History, Surgical history, Social history, and Family History were reviewed and updated.  Review of Systems: All other 10 point review of systems is negative.   Physical Exam:  weight is 183 lb (83 kg). His oral temperature is 98.2 F (36.8 C). His blood pressure is 143/79 (abnormal) and his pulse is 81. His respiration is 20 and oxygen saturation is 99%.   Wt Readings from Last 3 Encounters:  02/25/18 183 lb (83 kg)  02/23/18 183 lb 9.6 oz (83.3 kg)  01/20/18 185 lb 6.4 oz (84.1 kg)    Ocular: Sclerae unicteric, pupils equal, round and reactive to light Ear-nose-throat: Oropharynx clear, dentition fair Lymphatic: No cervical, supraclavicular or axillary adenopathy Lungs no rales or rhonchi, good excursion bilaterally Heart regular rate and rhythm, no murmur appreciated Abd soft, nontender, positive bowel sounds, no liver or spleen tip palpated on exam, no fluid wave  MSK no focal spinal tenderness, no  joint edema Neuro: non-focal, well-oriented, appropriate affect Breasts: Deferred   Lab Results  Component Value Date   WBC 5.2 02/23/2018   HGB 13.4 02/23/2018   HCT 39.7 02/23/2018   MCV 100.1 (H) 02/23/2018   PLT 253.0 02/23/2018   Lab Results  Component Value Date   FERRITIN 179.3 12/31/2017   IRON 112 12/31/2017   TIBC 284 12/31/2017   UIBC 172 12/31/2017   IRONPCTSAT 39 12/31/2017   Lab Results  Component Value Date   RBC 3.96 (L) 02/23/2018   No results found for: Nils Pyle [facility] Lab Results  Component Value Date   IGGSERUM 1,220 09/13/2015   IGA 244 09/13/2015   IGMSERUM 59 09/13/2015   Lab Results  Component Value Date   TOTALPROTELP 6.5 09/13/2015   ALBUMINELP 3.7 (L) 09/13/2015   A1GS 0.4 (H) 09/13/2015   A2GS 0.7  09/13/2015   BETS 0.4 09/13/2015   BETA2SER 0.3 09/13/2015   GAMS 1.0 09/13/2015   MSPIKE Not Observed 05/14/2013   SPEI SEE NOTE 09/13/2015     Chemistry      Component Value Date/Time   NA 142 02/23/2018 1136   K 4.9 02/23/2018 1136   CL 105 02/23/2018 1136   CO2 31 02/23/2018 1136   BUN 31 (H) 02/23/2018 1136   CREATININE 1.11 02/23/2018 1136   CREATININE 0.90 01/20/2018 1402      Component Value Date/Time   CALCIUM 9.7 02/23/2018 1136   ALKPHOS 65 02/23/2018 1136   AST 13 02/23/2018 1136   AST 15 01/20/2018 1402   ALT 12 02/23/2018 1136   ALT 12 01/20/2018 1402   BILITOT 0.7 02/23/2018 1136   BILITOT 0.4 01/20/2018 1402      Impression and Plan: Brandon Mason is a very pleasant 84 yo caucasian gentleman with right lower extremity thrombus, 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 He has tolerated treatment with Eliquis nicely and has no complaints at this time.   Again, we will keep him on the Eliquis for right now.  I just want to keep him on 1 year.  We will stop this in January.  I do not think we need any additional Dopplers on him.  He will always have the chronic thrombus.  I told him to talk to his family doctor to see about the amlodipine and seeing if this can be changed at this might help with his swelling.  He is on hydrochlorothiazide.  I told him to double the dose for 4 days and see if this helps with the swelling in his legs.Brandon Napoleon, MD 8/28/201912:55 PM

## 2018-03-16 ENCOUNTER — Other Ambulatory Visit: Payer: Self-pay | Admitting: Medical

## 2018-03-18 DIAGNOSIS — R2689 Other abnormalities of gait and mobility: Secondary | ICD-10-CM | POA: Diagnosis not present

## 2018-03-18 DIAGNOSIS — R600 Localized edema: Secondary | ICD-10-CM | POA: Diagnosis not present

## 2018-03-18 DIAGNOSIS — M722 Plantar fascial fibromatosis: Secondary | ICD-10-CM | POA: Diagnosis not present

## 2018-03-18 DIAGNOSIS — M7731 Calcaneal spur, right foot: Secondary | ICD-10-CM | POA: Diagnosis not present

## 2018-03-18 DIAGNOSIS — M7751 Other enthesopathy of right foot: Secondary | ICD-10-CM | POA: Diagnosis not present

## 2018-03-25 DIAGNOSIS — H04123 Dry eye syndrome of bilateral lacrimal glands: Secondary | ICD-10-CM | POA: Diagnosis not present

## 2018-03-25 DIAGNOSIS — D4981 Neoplasm of unspecified behavior of retina and choroid: Secondary | ICD-10-CM | POA: Diagnosis not present

## 2018-03-25 DIAGNOSIS — H35373 Puckering of macula, bilateral: Secondary | ICD-10-CM | POA: Diagnosis not present

## 2018-03-25 DIAGNOSIS — H35343 Macular cyst, hole, or pseudohole, bilateral: Secondary | ICD-10-CM | POA: Diagnosis not present

## 2018-03-25 DIAGNOSIS — Z961 Presence of intraocular lens: Secondary | ICD-10-CM | POA: Diagnosis not present

## 2018-03-30 ENCOUNTER — Encounter: Payer: Self-pay | Admitting: Medical

## 2018-03-30 ENCOUNTER — Ambulatory Visit (INDEPENDENT_AMBULATORY_CARE_PROVIDER_SITE_OTHER): Payer: Medicare Other | Admitting: Medical

## 2018-03-30 VITALS — BP 130/71 | HR 77 | Temp 97.9°F | Resp 16 | Ht 68.0 in | Wt 181.8 lb

## 2018-03-30 DIAGNOSIS — I10 Essential (primary) hypertension: Secondary | ICD-10-CM | POA: Diagnosis not present

## 2018-03-30 DIAGNOSIS — B49 Unspecified mycosis: Secondary | ICD-10-CM | POA: Diagnosis not present

## 2018-03-30 DIAGNOSIS — R600 Localized edema: Secondary | ICD-10-CM | POA: Diagnosis not present

## 2018-03-30 MED ORDER — NYSTATIN 100000 UNIT/GM EX CREA
1.0000 | TOPICAL_CREAM | Freq: Two times a day (BID) | CUTANEOUS | 1 refills | Status: DC
Start: 2018-03-30 — End: 2018-09-10

## 2018-03-30 NOTE — Patient Instructions (Addendum)
For fungal infection of groin left side use nystatin. When resolved could use miconazole antifungal powder.   For pedal edema recommend continue hctz and leg elevation.  Offered flu vaccine today. Pt wants to wait toward end of November.  Follow up 3 month or as needed

## 2018-03-30 NOTE — Progress Notes (Signed)
Subjective:    Patient ID: Brandon Mason, male    DOB: Dec 29, 1925, 84 y.o.   MRN: [id number]  HPI  Pt in for rash on his groin  States hx of rash with itching. He states sweats a lot in that area. Will get rash about twice a year. He tried some neosporin but did not help.Recent rash present for about 2 weeks.   Pt expresses some frustration with pedal edema initially but then made comments really does not bother him much. We discussed fact that he is on eliquis and work up done to rule out chf so cause is pedal edema. He does not think ted hose helps him. He states only does not look good.    Review of Systems  Constitutional: Negative for chills, fatigue and fever.  Respiratory: Negative for cough, chest tightness, shortness of breath and wheezing.   Cardiovascular: Negative for chest pain and palpitations.  Gastrointestinal: Negative for abdominal pain.  Skin: Negative for rash.       Rash on groin area. It is on left.  Neurological: Negative for dizziness, seizures, speech difficulty, weakness, numbness and headaches.  Hematological: Negative for adenopathy. Does not bruise/bleed easily.  Psychiatric/Behavioral: Negative for behavioral problems, confusion and dysphoric mood.    Past Medical History:  Diagnosis Date  . Abnormality of gait 02/09/2013  . Arthritis   . Cancer ([place])    basal cell skin cancer  . Cardiomegaly   . Chronic kidney disease    BPH  . Chronic leg pain   . Hypersomnia   . Hypertension   . Hypoxia    pulmonary  . Neuromuscular disorder (HCC)    restless leg syndrome   . Nocturia   . Prostate disorder   . Restless legs syndrome (RLS) 02/09/2013  . Restless legs syndrome with nocturnal myoclonus   . RLS (restless legs syndrome)    x 40 years  . Shortness of breath    with exertion   . Sleep apnea    uses cpap  . Stroke [facility])    minor stroke in 2003 - no lasting side effects  . UARS (upper airway resistance syndrome) 06/28/2014     Social  History   Socioeconomic History  . Marital status: Married    Spouse name: [name]  . Number of children: 5  . Years of education: 100  . Highest education level: Not on file  Occupational History  . 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 Financial resource strain: Not on file  . Food insecurity:    Worry: Not on file    Inability: Not on file  . Transportation needs:    Medical: Not on file    Non-medical: Not on file  Tobacco Use  . Smoking status: Never Smoker  . Smokeless tobacco: Never Used  Substance and Sexual Activity  . Alcohol use: Yes    Alcohol/week: 2.0 standard drinks    Types: 2 Cans of beer per week    Comment: weekly  . Drug use: No  . Sexual activity: Not on file  Lifestyle  . Physical activity:    Days per week: Not on file    Minutes per session: Not on file  . Stress: Not on file  Relationships  . Social connections:    Talks on phone: Not on file    Gets together: Not on file    Attends religious service: Not on file  Active member of club or organization: Not on file    Attends meetings of clubs or organizations: Not on file    Relationship status: Not on file  . Intimate partner violence:    Fear of current or ex partner: Not on file    Emotionally abused: Not on file    Physically abused: Not on file    Forced sexual activity: Not on file  Other Topics Concern  . Not on file  Social History Narrative   Patient lives at home with his wife [name].   Patient is retired.    Patient has 5 children.   Patient has a high school education.   Patient is right-handed.   Patient drinks very little caffeine.    Past Surgical History:  Procedure Laterality Date  . ANTERIOR CERVICAL DECOMP/DISCECTOMY FUSION N/A 03/29/2016   Procedure: ANTERIOR CERVICAL DECOMPRESSION/DISCECTOMY FUSION CERVICAL THREE- CERVICAL FOUR;  Surgeon: Ashok Pall, MD;  Location: [place] [facility];  Service: Neurosurgery;  Laterality: N/A;    . BACK SURGERY  2003   L4-5  . CATARACT EXTRACTION Bilateral   . HERNIA REPAIR     inguinal hernia  . TRANSURETHRAL PROSTATECTOMY WITH GYRUS INSTRUMENTS  04/28/2012   Procedure: TRANSURETHRAL PROSTATECTOMY WITH GYRUS INSTRUMENTS;  Surgeon: Fredricka Bonine, MD;  Location: [facility];  Service: Urology;  Laterality: N/A;  . TRANSURETHRAL RESECTION OF PROSTATE  03/24/2012   Procedure: TRANSURETHRAL RESECTION OF THE PROSTATE (TURP);  Surgeon: Fredricka Bonine, MD;  Location: [facility];  Service: Urology;  Laterality: N/A;  with gyrus ,Greenlight PVP laser of Prostate    Family History  Problem Relation Age of Onset  . Restless legs syndrome Mother   . Restless legs syndrome Daughter   . Esophageal cancer Neg Hx   . Colon cancer Neg Hx   . Pancreatic cancer Neg Hx   . Stomach cancer Neg Hx     No Known Allergies  Current Outpatient Medications on File Prior to Visit  Medication Sig Dispense Refill  . amLODipine (NORVASC) 10 MG tablet TAKE 1 TABLET BY MOUTH ONCE DAILY 90 tablet 0  . ELIQUIS 5 MG TABS tablet TAKE 1 TABLET BY MOUTH TWICE DAILY 60 tablet 2  . gabapentin (NEURONTIN) 600 MG tablet TAKE 1 TABLET BY MOUTH THREE TIMES DAILY 270 tablet 1  . hydrochlorothiazide (HYDRODIURIL) 25 MG tablet TAKE 1 TABLET BY MOUTH ONCE DAILY 90 tablet 0  . lisinopril (PRINIVIL,ZESTRIL) 20 MG tablet TAKE 1 TABLET BY MOUTH ONCE DAILY 90 tablet 1  . rOPINIRole (REQUIP) 4 MG tablet TAKE 1 TABLET BY MOUTH AT BEDTIME 30 tablet 3  . traMADol (ULTRAM) 50 MG tablet Take 1 tablet (50 mg total) by mouth every 8 (eight) hours as needed. increased frequency 90 tablet 0   No current facility-administered medications on file prior to visit.     BP 130/71   Pulse 77   Temp 97.9 F (36.6 C) (Oral)   Resp 16   Ht 5\' 8"  (1.727 m)   Wt 181 lb 12.8 oz (82.5 kg)   SpO2 95%   BMI 27.64 kg/m       Objective:   Physical Exam  General- No acute distress. Pleasant patient. Neck- Full range of motion,  no jvd Lungs- Clear, even and unlabored. Heart- regular rate and rhythm. Neurologic- CNII- XII grossly intact.  Lower ext- pedal edema. Still present. Negative homans signs. 2+ pedal edema bilaterally.       Assessment & Plan:  For fungal infection  of groin left side use nystatin. When resolved could use miconazole antifungal powder.   For pedal edema recommend continue hctz and leg elevation. Advised if leg swelling worsens let us know.  Offered flu vaccine today. Pt wants to wait toward end of November.  Follow up 3 month or as needed  25 minutes spent with pt. 50% time spent counseling pt on treatment plan for rash as well as reason regarding not wanting to prescribe stronger diuretic.  Mackie Pai, [name]

## 2018-04-01 DIAGNOSIS — M722 Plantar fascial fibromatosis: Secondary | ICD-10-CM | POA: Diagnosis not present

## 2018-04-01 DIAGNOSIS — M7751 Other enthesopathy of right foot: Secondary | ICD-10-CM | POA: Diagnosis not present

## 2018-04-03 ENCOUNTER — Other Ambulatory Visit: Payer: Self-pay | Admitting: Medical

## 2018-04-11 ENCOUNTER — Other Ambulatory Visit: Payer: Self-pay | Admitting: Medical

## 2018-04-14 NOTE — Telephone Encounter (Signed)
Refill Request: Tramadol  Last RX:01/27/18 Last OV:03/30/18 Next OV: none scheduled UDS:10/22/17 CSC:10/22/17 CSR:

## 2018-04-14 NOTE — Telephone Encounter (Signed)
Rx tramadol sent to pt pharmacy. Any other meds need refilling.

## 2018-04-18 ENCOUNTER — Other Ambulatory Visit: Payer: Self-pay | Admitting: Medical

## 2018-04-26 ENCOUNTER — Other Ambulatory Visit: Payer: Self-pay | Admitting: Medical

## 2018-05-03 ENCOUNTER — Other Ambulatory Visit: Payer: Self-pay | Admitting: Medical

## 2018-05-10 IMAGING — DX DG CHEST 2V
2 series · 2 of 2 positions shown · non-contrast
Comparison: 11/16/2015

CLINICAL DATA: Fever tonight. Recent neck surgery. Neck pain and
body aches.

EXAM:
CHEST  2 VIEW

[x chest ap]
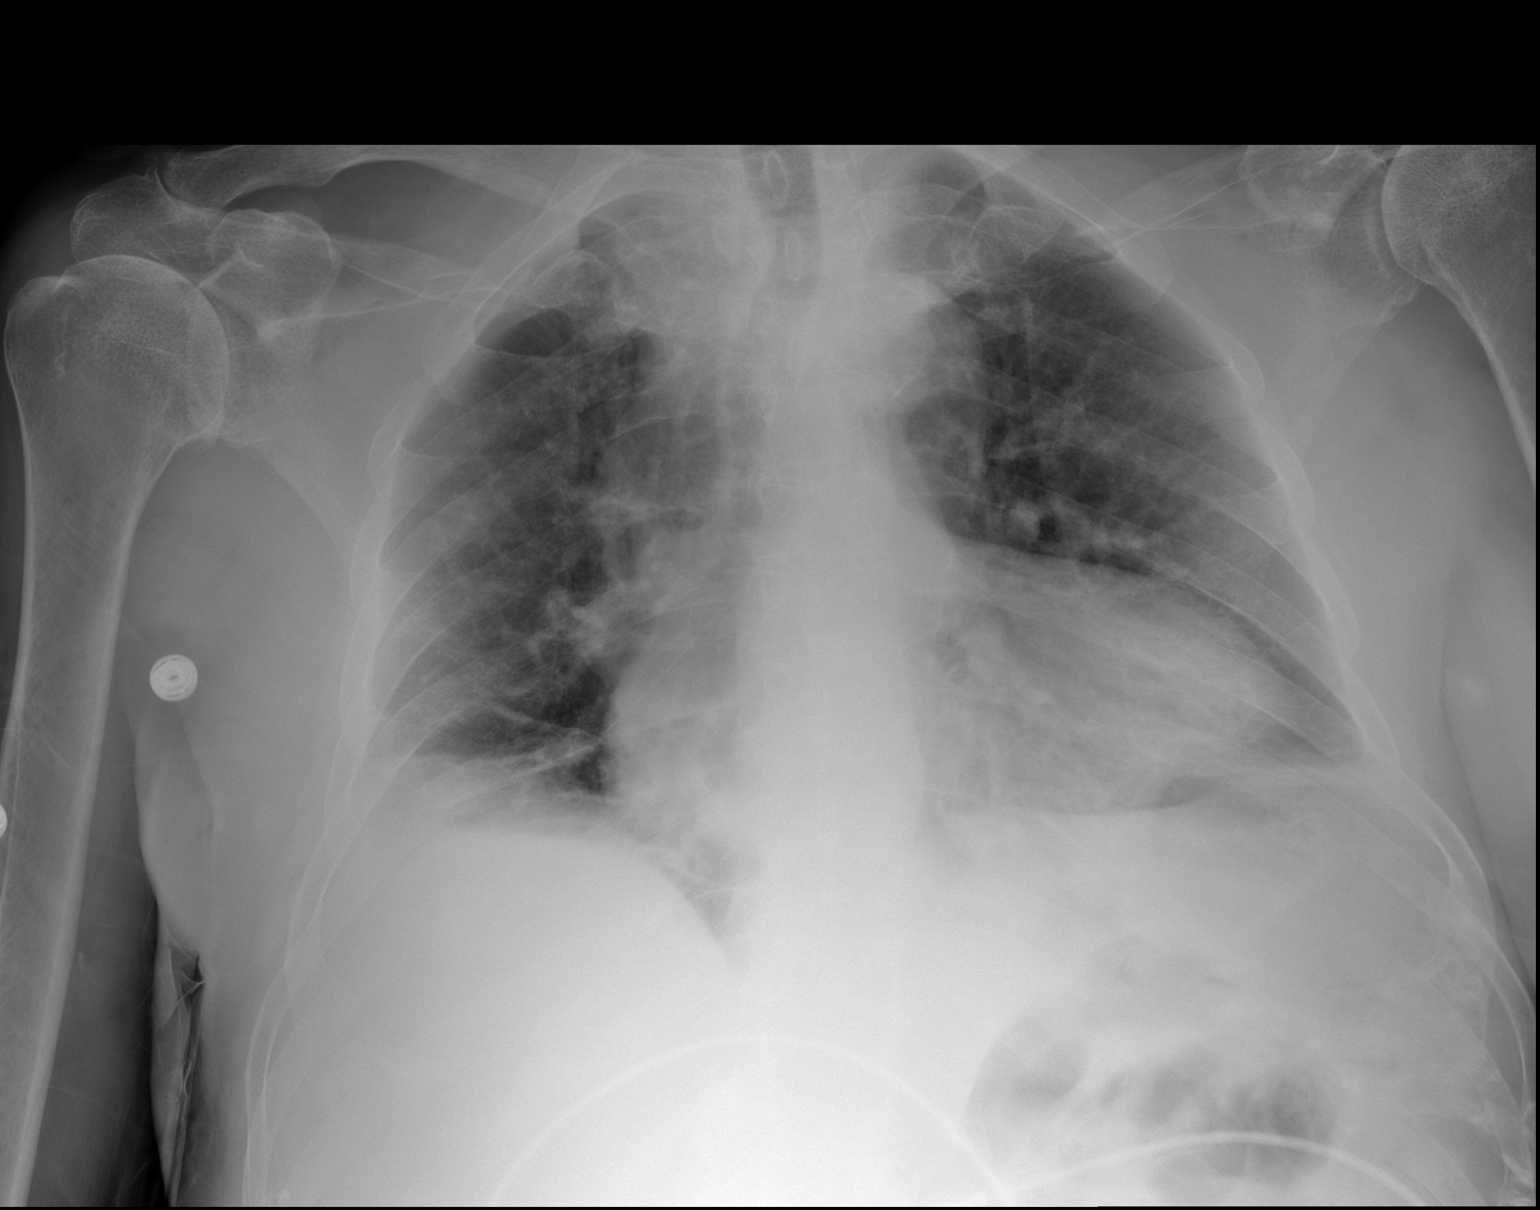

[w chest lat]
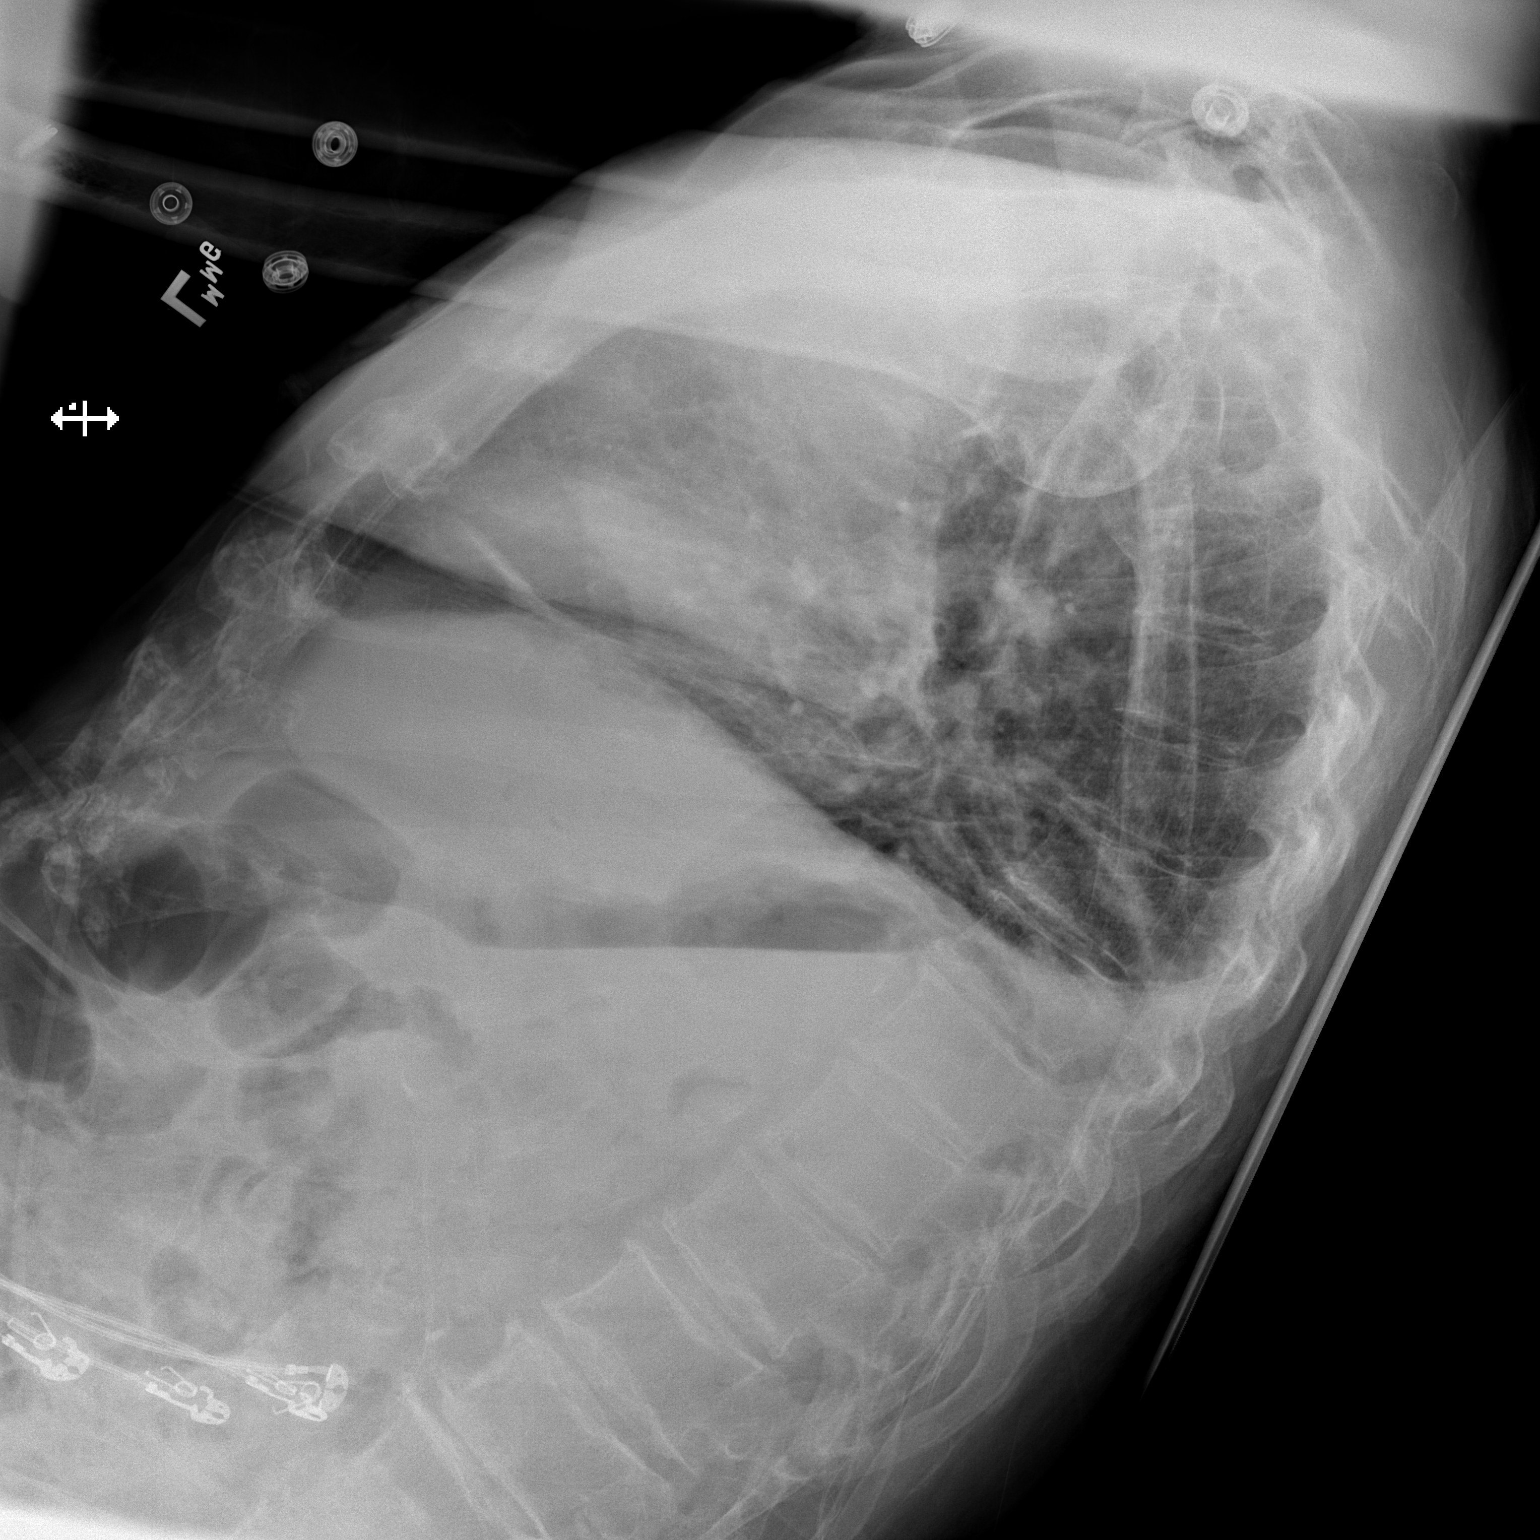

[2 of 2 positions shown; findings below may reference images not displayed]

FINDINGS: Shallow inspiration with linear atelectasis in the lung bases.
Cardiac enlargement. No pulmonary vascular congestion or
consolidation. No pneumothorax. Soft tissue density in the right
thoracic inlet displacing the trachea towards the left probably
representing thyroid goiter. In the setting of recent neck surgery,
cannot exclude a fluid collection causing this. Suggest CT chest for
further evaluation. Small bilateral pleural effusions.
IMPRESSION: Cardiac enlargement. Shallow inspiration with atelectasis in the
lung bases. Small bilateral pleural effusions. Soft tissue density
in the right peritracheal region displacing the trachea towards the
left. This may represent thyroid goiter but in the setting of
postoperative change, a fluid collection is not excluded. Consider
CT for further evaluation.

## 2018-05-11 IMAGING — CT CT NECK W/ CM
4 of 5 series · 15 of 34 positions shown, 17 images · IV contrast (iopamidol)
Comparison: Cervical spine radiograph March 29, 2016

CLINICAL DATA: Severe neck pain and dysphagia after spinal fusion 5
days ago. LEFT neck surgical incision.

EXAM:
CT NECK WITH CONTRAST
TECHNIQUE: Multidetector CT imaging of the neck was performed using the
standard protocol following the bolus administration of intravenous
contrast.
CONTRAST:  75mL KRVKS4-LJJ IOPAMIDOL (KRVKS4-LJJ) INJECTION 61%

[Series 2: neck 2.0 st · axial · 0.56mm/px · z∈[-223,-115]mm · 3 of 108 slices shown (1 of 3)]
[im 27/108  bone]
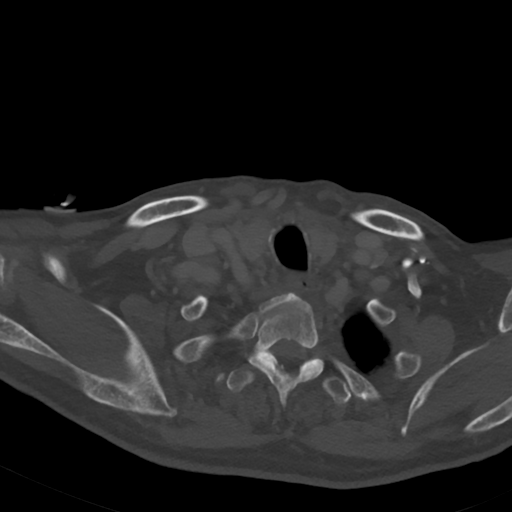
[im 54/108  bone]
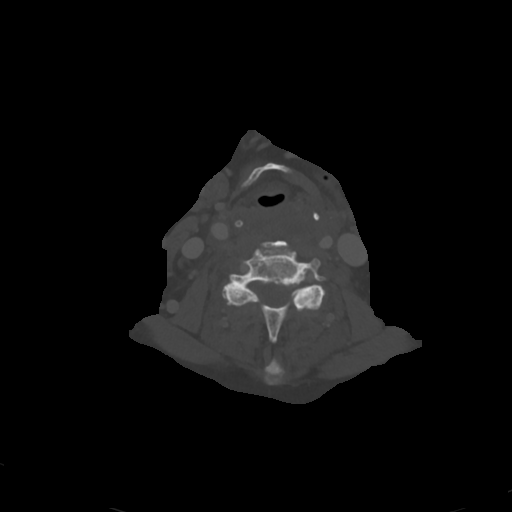
[im 81/108  bone]
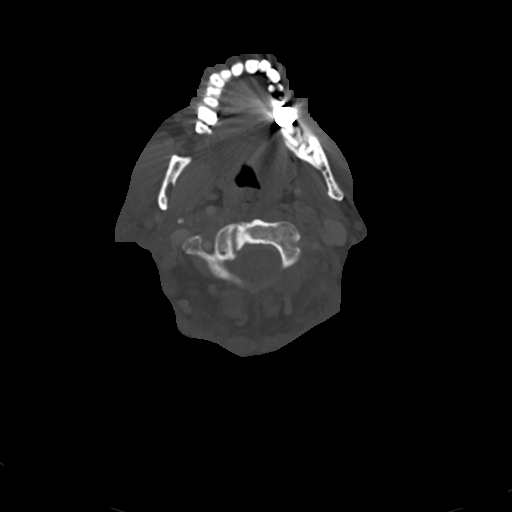

[Series 4: neck 2.0 st · sagittal · 0.47mm/px · 5 of 89 slices shown, 6 images (2 of 3)]
[im 30/89  bone]
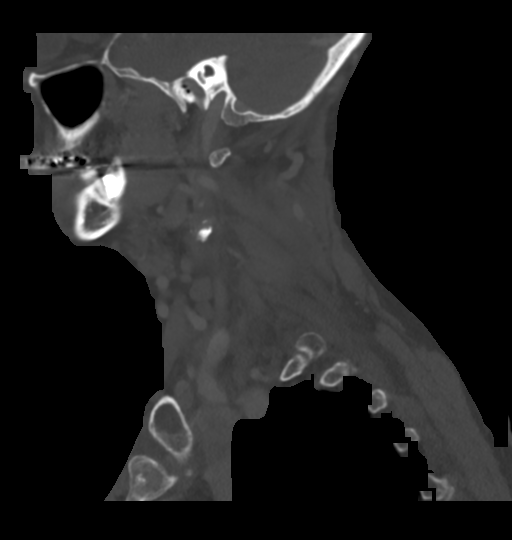
[im 37/89  bone]
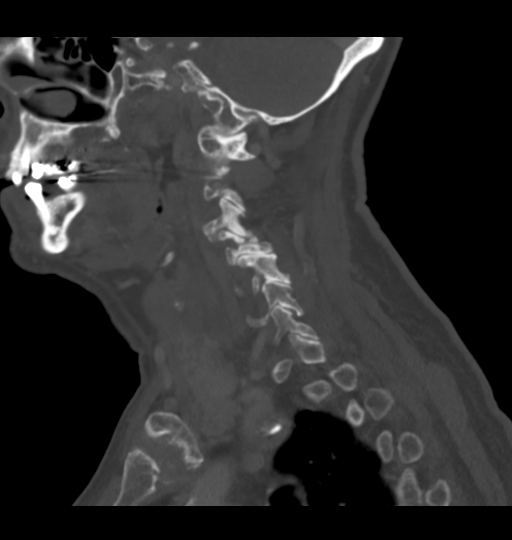
[im 45/89  soft-tissue]
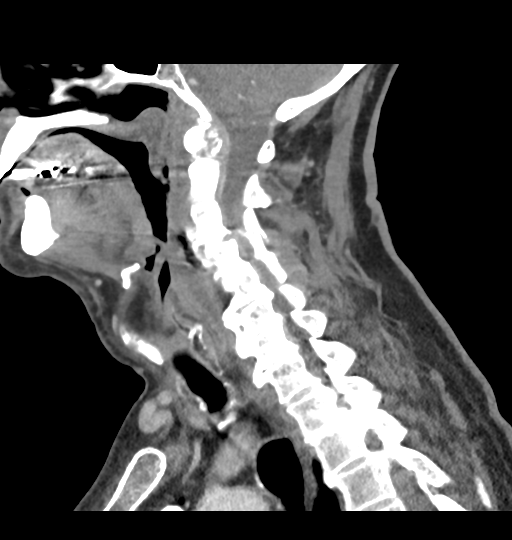
[im 45/89  bone]
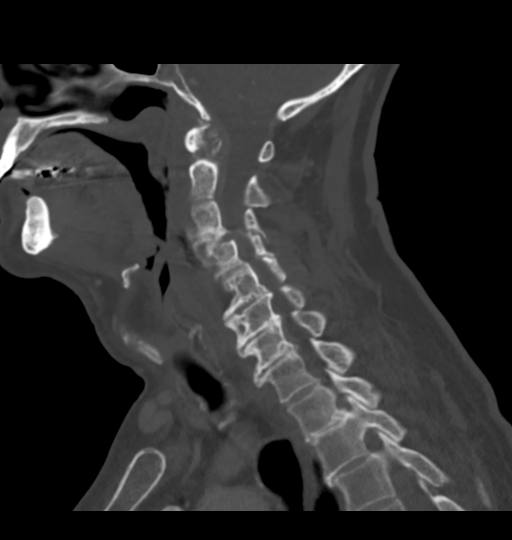
[im 52/89  bone]
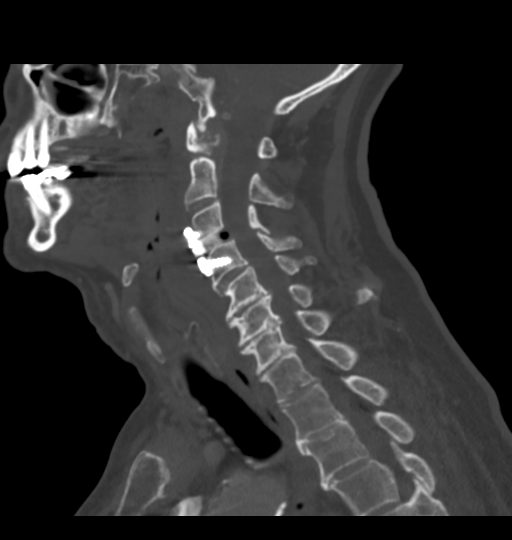
[im 59/89  bone]
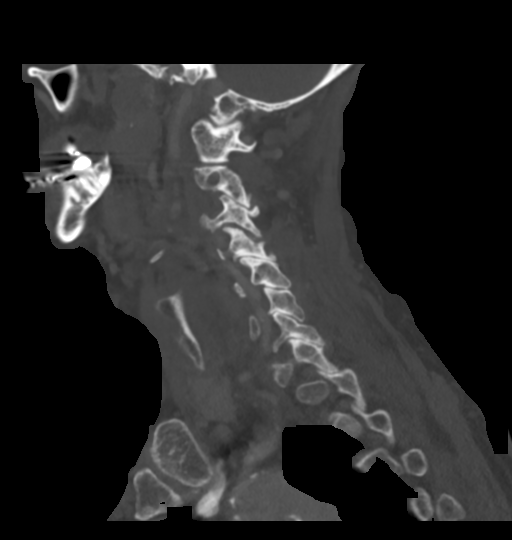

[Series 5: neck 2.0 st · coronal · 0.56mm/px · 3 of 92 slices shown (3 of 3)]
[im 29/92  bone]
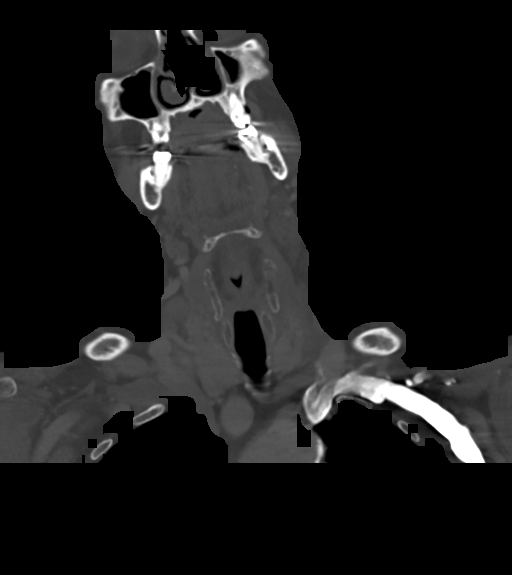
[im 40/92  bone]
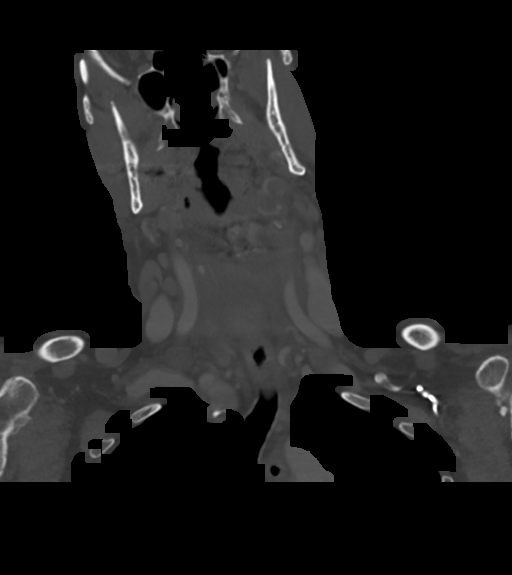
[im 52/92  bone]
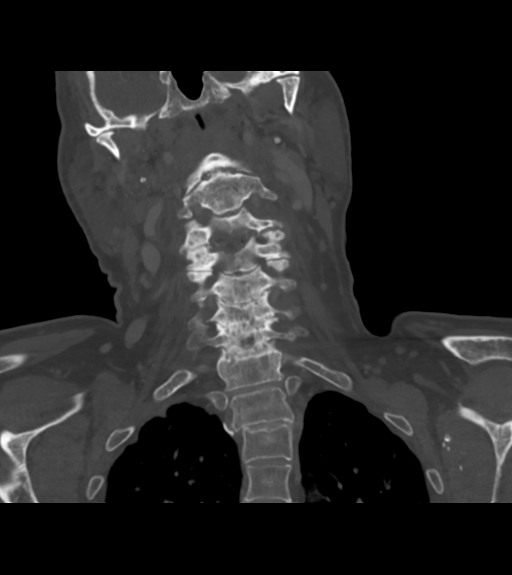

[Series 6: neck 2.0 st orthogonal · axial · 0.48mm/px · z∈[-259,-149]mm · 4 of 103 slices shown, 5 images]
[im 21/103  soft-tissue]
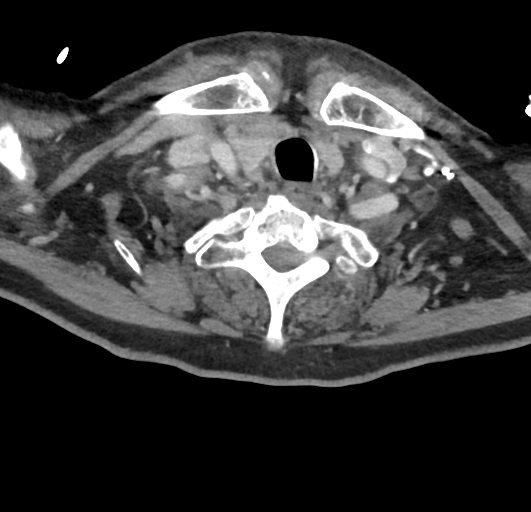
[im 21/103  bone]
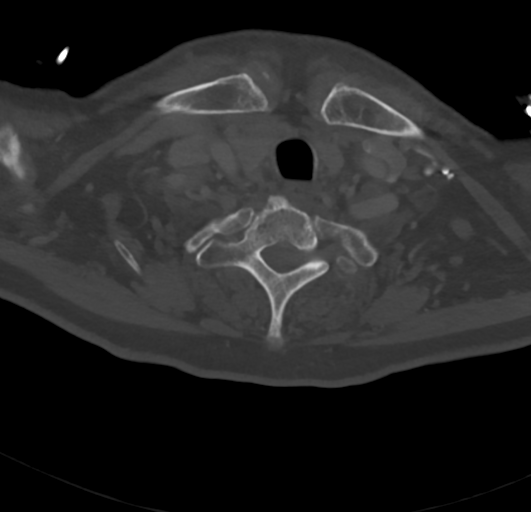
[im 41/103  bone]
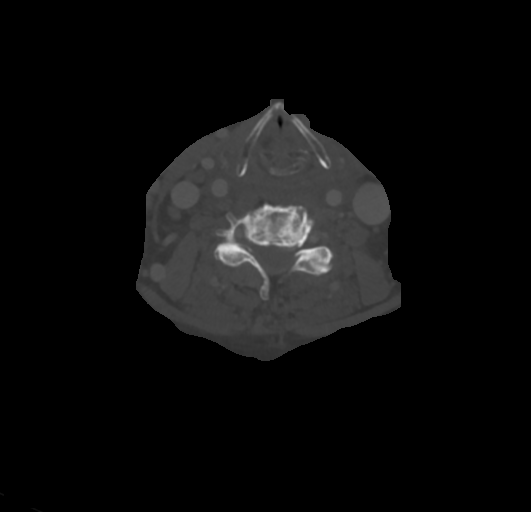
[im 62/103  bone]
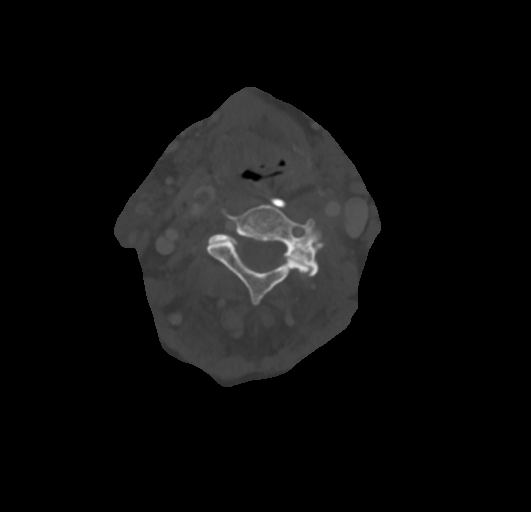
[im 82/103  bone]
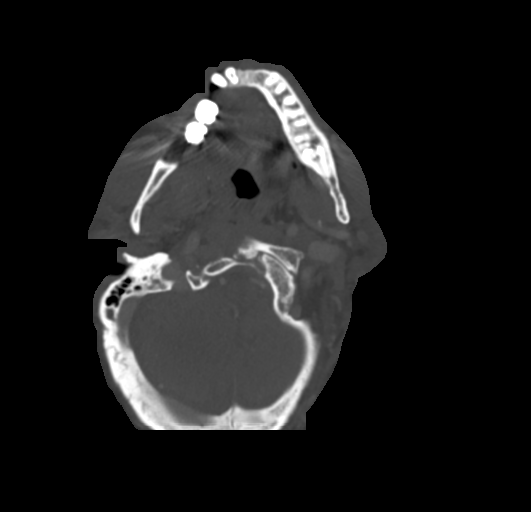

[15 of 34 positions shown; findings below may reference images not displayed]

FINDINGS: Pharynx and larynx: Small retropharyngeal/ prevertebral effusion.
Aerodigestive tract is otherwise unremarkable.

Salivary glands: Normal.

Thyroid: Heterogeneous thyroid gland without dominant nodule.

Lymph nodes: No lymphadenopathy by CT size criteria.

Vascular: Calcific atherosclerosis resulting in probable severe
stenosis of bilateral internal carotid artery origins though not
tailored for evaluation.

Limited intracranial: Normal.

Visualized orbits: Nonacute. Status post RIGHT ocular lens implant.
LEFT ocular globe not imaged.

Mastoids and visualized paranasal sinuses: Atretic LEFT maxillary
sinus with mild mucosal thickening consistent with chronic
sinusitis.

Skeleton: Status post in C3-4 ACDF with intact well-seated hardware.
Severe C5-6 through C7-T1 degenerative discs. Severe C3-4 facet
arthropathy, and mild widening on the LEFT. Severe degenerative
change of the RIGHT shoulder.

Upper chest: Lung apices are clear. No superior mediastinal
lymphadenopathy.

Other: Small bilateral lower neck effusions, contiguous with
prevertebral/retropharyngeal effusion without focal fluid
collection. LEFT neck fat stranding with small amount of
subcutaneous gas consistent with recent surgical approach.
IMPRESSION: Status post recent C3-4 ACDF with anterior neck effusion, no focal
fluid collection. Patent airway.

Severe suspected bilateral internal carotid artery stenosis due to
atherosclerosis which would be better characterized on CTA NECK on a
nonemergent basis.

## 2018-05-13 ENCOUNTER — Ambulatory Visit (INDEPENDENT_AMBULATORY_CARE_PROVIDER_SITE_OTHER): Payer: Medicare Other

## 2018-05-13 DIAGNOSIS — Z23 Encounter for immunization: Secondary | ICD-10-CM

## 2018-05-18 ENCOUNTER — Ambulatory Visit (INDEPENDENT_AMBULATORY_CARE_PROVIDER_SITE_OTHER): Payer: Medicare Other | Admitting: Internal Medicine

## 2018-05-18 ENCOUNTER — Encounter: Payer: Self-pay | Admitting: Internal Medicine

## 2018-05-18 ENCOUNTER — Ambulatory Visit (HOSPITAL_BASED_OUTPATIENT_CLINIC_OR_DEPARTMENT_OTHER)
Admission: RE | Admit: 2018-05-18 | Discharge: 2018-05-18 | Disposition: A | Discharge status: Home / Self Care | Payer: Medicare Other | Source: Ambulatory Visit | Attending: Internal Medicine | Admitting: Internal Medicine

## 2018-05-18 VITALS — BP 122/78 | HR 97 | Temp 98.3°F | Resp 16 | Ht 68.0 in | Wt 180.0 lb

## 2018-05-18 DIAGNOSIS — R51 Headache: Secondary | ICD-10-CM | POA: Diagnosis not present

## 2018-05-18 DIAGNOSIS — G4452 New daily persistent headache (NDPH): Secondary | ICD-10-CM

## 2018-05-18 DIAGNOSIS — R9082 White matter disease, unspecified: Secondary | ICD-10-CM | POA: Diagnosis not present

## 2018-05-18 DIAGNOSIS — G319 Degenerative disease of nervous system, unspecified: Secondary | ICD-10-CM | POA: Diagnosis not present

## 2018-05-18 LAB — BASIC METABOLIC PANEL
BUN: 32 mg/dL — ABNORMAL HIGH (ref 6–23)
CHLORIDE: 102 meq/L (ref 96–112)
CO2: 31 mEq/L (ref 19–32)
CREATININE: 1.16 mg/dL (ref 0.40–1.50)
Calcium: 9.3 mg/dL (ref 8.4–10.5)
GFR: 62.87 mL/min (ref >60.00)
Glucose, Bld: 102 mg/dL — ABNORMAL HIGH (ref 70–99)
POTASSIUM: 4 meq/L (ref 3.5–5.1)
Sodium: 140 mEq/L (ref 135–145)

## 2018-05-18 LAB — CBC WITH DIFFERENTIAL/PLATELET
BASOS PCT: 0.6 % (ref 0.0–3.0)
Basophils Absolute: 0 10*3/uL (ref 0.0–0.1)
EOS ABS: 0.1 10*3/uL (ref 0.0–0.7)
EOS PCT: 0.9 % (ref 0.0–5.0)
HCT: 39.7 % (ref 39.0–52.0)
HEMOGLOBIN: 13.5 g/dL (ref 13.0–17.0)
LYMPHS PCT: 19.8 % (ref 12.0–46.0)
Lymphs Abs: 1.6 10*3/uL (ref 0.7–4.0)
MCHC: 33.9 g/dL (ref 30.0–36.0)
MCV: 99.8 fl (ref 78.0–100.0)
Monocytes Absolute: 0.8 10*3/uL (ref 0.1–1.0)
Monocytes Relative: 10.6 % (ref 3.0–12.0)
Neutro Abs: 5.4 10*3/uL (ref 1.4–7.7)
Neutrophils Relative %: 68.1 % (ref 43.0–77.0)
Platelets: 277 10*3/uL (ref 150.0–400.0)
RBC: 3.98 Mil/uL — AB (ref 4.22–5.81)
RDW: 13 % (ref 11.5–15.5)
WBC: 8 10*3/uL (ref 4.0–10.5)

## 2018-05-18 LAB — SEDIMENTATION RATE: SED RATE: 8 mm/h (ref 0–20)

## 2018-05-18 NOTE — Patient Instructions (Signed)
GO TO THE LAB : Get the blood work     STOP BY THE FIRST FLOOR:  get the CT  If you have fever, chills, intense symptoms, a rash anywhere, problems with  your eyes: Call or go to the ER.

## 2018-05-18 NOTE — Progress Notes (Signed)
Pre visit review using our clinic review tool, if applicable. No additional management support is needed unless otherwise documented below in the visit note. 

## 2018-05-18 NOTE — Progress Notes (Signed)
Subjective:    Patient ID: Brandon Mason, male    DOB: Apr 01, 1926, 84 y.o.   MRN: [id number]  DOS:  05/18/2018 Type of visit - description : acute  Symptoms of started approximately 5 days ago, gradually. Started with a right-sided pain at the forehead, then pain at theright side of the head and right posterior neck. The pain is a steady, getting gradually worse, worse at night despite taking tramadol. Not worse when he moves his head or neck I asked about this being the worst headache of his life and he said yes. The area of worst pain is at the base of the right neck He has hypertension, good med compliance, no recent ambulatory BPs   Review of Systems Denies fever chills No nausea or vomiting No visual disturbances No focal deficits or speech difficulties No jaw claudication No unusual myalgias No falls or injury  Past Medical History:  Diagnosis Date  . Abnormality of gait 02/09/2013  . Arthritis   . Cancer ([place])    basal cell skin cancer  . Cardiomegaly   . Chronic kidney disease    BPH  . Chronic leg pain   . Hypersomnia   . Hypertension   . Hypoxia    pulmonary  . Neuromuscular disorder (HCC)    restless leg syndrome   . Nocturia   . Prostate disorder   . Restless legs syndrome (RLS) 02/09/2013  . Restless legs syndrome with nocturnal myoclonus   . RLS (restless legs syndrome)    x 40 years  . Shortness of breath    with exertion   . Sleep apnea    uses cpap  . Stroke [facility])    minor stroke in 2003 - no lasting side effects  . UARS (upper airway resistance syndrome) 06/28/2014    Past Surgical History:  Procedure Laterality Date  . ANTERIOR CERVICAL DECOMP/DISCECTOMY FUSION N/A 03/29/2016   Procedure: ANTERIOR CERVICAL DECOMPRESSION/DISCECTOMY FUSION CERVICAL THREE- CERVICAL FOUR;  Surgeon: Ashok Pall, MD;  Location: [place] [facility];  Service: Neurosurgery;  Laterality: N/A;  . BACK SURGERY  2003   L4-5  . CATARACT EXTRACTION Bilateral   . HERNIA  REPAIR     inguinal hernia  . TRANSURETHRAL PROSTATECTOMY WITH GYRUS INSTRUMENTS  04/28/2012   Procedure: TRANSURETHRAL PROSTATECTOMY WITH GYRUS INSTRUMENTS;  Surgeon: Fredricka Bonine, MD;  Location: [facility];  Service: Urology;  Laterality: N/A;  . TRANSURETHRAL RESECTION OF PROSTATE  03/24/2012   Procedure: TRANSURETHRAL RESECTION OF THE PROSTATE (TURP);  Surgeon: Fredricka Bonine, MD;  Location: [facility];  Service: Urology;  Laterality: N/A;  with gyrus ,Greenlight PVP laser of Prostate    Social History   Socioeconomic History  . Marital status: Married    Spouse name: [name]  . Number of children: 5  . Years of education: 58  . Highest education level: Not on file  Occupational History  . 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 Financial resource strain: Not on file  . Food insecurity:    Worry: Not on file    Inability: Not on file  . Transportation needs:    Medical: Not on file    Non-medical: Not on file  Tobacco Use  . Smoking status: Never Smoker  . Smokeless tobacco: Never Used  Substance and Sexual Activity  . Alcohol use: Yes    Alcohol/week: 2.0 standard drinks    Types: 2 Cans of beer per week  Comment: weekly  . Drug use: No  . Sexual activity: Not on file  Lifestyle  . Physical activity:    Days per week: Not on file    Minutes per session: Not on file  . Stress: Not on file  Relationships  . Social connections:    Talks on phone: Not on file    Gets together: Not on file    Attends religious service: Not on file    Active member of club or organization: Not on file    Attends meetings of clubs or organizations: Not on file    Relationship status: Not on file  . Intimate partner violence:    Fear of current or ex partner: Not on file    Emotionally abused: Not on file    Physically abused: Not on file    Forced sexual activity: Not on file  Other Topics Concern  . Not on file  Social History  Narrative   Patient lives at home with his wife [name].   Patient is retired.    Patient has 5 children.   Patient has a high school education.   Patient is right-handed.   Patient drinks very little caffeine.      Allergies as of 05/18/2018   No Known Allergies     Medication List        Accurate as of 05/18/18 11:59 PM. Always use your most recent med list.          amLODipine 10 MG tablet Commonly known as:  NORVASC TAKE 1 TABLET BY MOUTH ONCE DAILY   ELIQUIS 5 MG Tabs tablet Generic drug:  apixaban TAKE 1 TABLET BY MOUTH TWICE DAILY   gabapentin 600 MG tablet Commonly known as:  NEURONTIN TAKE 1 TABLET BY MOUTH THREE TIMES DAILY   hydrochlorothiazide 25 MG tablet Commonly known as:  HYDRODIURIL TAKE 1 TABLET BY MOUTH ONCE DAILY   lisinopril 20 MG tablet Commonly known as:  PRINIVIL,ZESTRIL TAKE 1 TABLET BY MOUTH ONCE DAILY   nystatin cream Commonly known as:  MYCOSTATIN Apply 1 application topically 2 (two) times daily.   rOPINIRole 4 MG tablet Commonly known as:  REQUIP TAKE 1 TABLET BY MOUTH AT BEDTIME   traMADol 50 MG tablet Commonly known as:  ULTRAM TAKE 1 TABLET BY MOUTH EVERY 8 HOURS AS NEEDED. INCREASED FREQUENCY           Objective:   Physical Exam  HENT:  Head:     BP 122/78 (BP Location: Left Arm, Patient Position: Sitting, Cuff Size: Small)   Pulse 97   Temp 98.3 F (36.8 C) (Oral)   Resp 16   Ht 5\' 8"  (1.727 m)   Wt 180 lb (81.6 kg)   SpO2 96%   BMI 27.37 kg/m  General:   Well developed, NAD, BMI noted. HEENT:  Normocephalic . Face symmetric, atraumatic Neck: Slightly TTP at the right base-posteriorly.  Range of motion is normal without difficulties Temples: Good TA pulses, no TTP Lungs:  CTA B Normal respiratory effort, no intercostal retractions, no accessory muscle use. Heart: RRR,  no murmur.  No pretibial edema bilaterally  Skin: Not pale. Not jaundice Neurologic:  alert & oriented X3.  Speech normal, gait  appropriate for age and unassisted. Motor: Symmetric DTRs symmetric EOMI, pupils equal and reactive Psych--  Cognition and judgment appear intact.  Cooperative with normal attention span and concentration.  Behavior appropriate. No anxious or depressed appearing.      Assessment & Plan:  84 year old gentleman with history of BPH that is post surgery 2013, bcc skin cancer, HTN,h/o  neck surgery, on eliquis d/t h/o DVT presents with  Headache: Right-sided, as described above.  I review my note from 2018 and symptoms are very similar to that time except that at this time headache is not clearly MSK (does not get worse with head motion) but the point of maximal pain is in the neck. DDX: Intracranial process, inflammatory process, cervicogenic headache, others.   Plan: CT stat, BMP, CBC, sed rate.  Further advised with results

## 2018-05-19 ENCOUNTER — Ambulatory Visit: Payer: Medicare Other | Admitting: Hematology & Oncology

## 2018-05-19 ENCOUNTER — Other Ambulatory Visit: Payer: Medicare Other

## 2018-05-20 ENCOUNTER — Other Ambulatory Visit: Payer: Self-pay | Admitting: Internal Medicine

## 2018-05-20 ENCOUNTER — Other Ambulatory Visit: Payer: Self-pay

## 2018-05-20 MED ORDER — PREDNISONE 10 MG PO TABS: ORAL_TABLET | ORAL | 0 refills | Status: DC

## 2018-05-23 ENCOUNTER — Other Ambulatory Visit: Payer: Self-pay | Admitting: Medical

## 2018-05-25 NOTE — Telephone Encounter (Signed)
Refill Request: Tramadol   Last RX:04/14/18 Last OV:05/18/18 Next LM:RAJH scheduled  UDS:10/22/17 CSC:10/22/17 CSR:

## 2018-05-26 NOTE — Telephone Encounter (Signed)
Refilled pt tramadol. Will you pull narx report and put on my desk.

## 2018-05-30 ENCOUNTER — Other Ambulatory Visit: Payer: Self-pay | Admitting: Medical

## 2018-06-02 NOTE — Telephone Encounter (Signed)
Refill Request:Tramadol  Last RX:05/26/18 Last OV:05/18/18 Next PR:FFMB scheduled  UDS:10/12/17 CSC:10/10/17 CSR:

## 2018-06-02 NOTE — Telephone Encounter (Signed)
I sent in 90 tab rx end of November. Not sure why he is asking for new rx? Is he confused or not aware that rx was sent in?

## 2018-07-08 ENCOUNTER — Inpatient Hospital Stay: Payer: Medicare Other

## 2018-07-08 ENCOUNTER — Inpatient Hospital Stay: Payer: Medicare Other | Admitting: Hematology & Oncology

## 2018-07-08 ENCOUNTER — Other Ambulatory Visit: Payer: Self-pay | Admitting: Medical

## 2018-07-09 NOTE — Telephone Encounter (Signed)
Refill Request: Tramadol  Last RX:05/26/18 Last OV:05/01/18 Next OV: None scheduled  UDS:10/22/17 CSC:10/22/17 CSR:

## 2018-07-09 NOTE — Telephone Encounter (Signed)
Rx tramadol sent to pt pharmacy. 

## 2018-07-13 ENCOUNTER — Telehealth: Payer: Self-pay | Admitting: Medical

## 2018-07-13 ENCOUNTER — Ambulatory Visit: Payer: Self-pay

## 2018-07-13 ENCOUNTER — Other Ambulatory Visit: Payer: Self-pay | Admitting: Medical

## 2018-07-13 MED ORDER — TRAMADOL HCL 50 MG PO TABS: 50.0000 mg | ORAL_TABLET | Freq: Three times a day (TID) | ORAL | 0 refills | Status: DC | PRN

## 2018-07-13 NOTE — Telephone Encounter (Signed)
Patient calling and states that [facility] [place] and the medication had not been sent yet. States that he was told to call the pharmacy in 2 hours after speaking with the triage nurse this morning. Tried to get someone from the office on the line, Santiago Glad answered and was advised that it was after 5pm and they are no longer back there. Explained to the patient that the office closed at [place] and I was told that Percell Miller was no longer there. Patient addiment that "they could be lying, just like they lied this morning saying that the medication was sent." Apologized and tried to explain to him that this would have to be sent in the morning since he hadn't had a change to get to it today due to being in with patients. That we have to have [name]'s approval and I was told he was no longer in the office. Patient states that "well I'm probably needing to see him more than some patients in there." Advised that yes that could be a possibility, but these patient's had an appointment today to see him. While trying to offer if patient needed an appointment, I could make that for him, he gets upset and says "but, but, but" and disconnected the call.

## 2018-07-13 NOTE — Telephone Encounter (Signed)
Med sent to [facility] [place] after hours when finally got chance to review medicine request. I sent rx days ago to [facility]. Second time sending rx to pharmacy.  Would you call [facility] and cancel the tramadol prescription. Very important since I don't want extra controlled rx out there.

## 2018-07-13 NOTE — Telephone Encounter (Signed)
Rx pharmacy sent to different pharmacy. Tramadol not available at [facility]. [facility] Reviewing this request after hours. Unable to review call in office refill med request until after office hours.  Note sent original medication days ago? Unusual for medication to not be available at pharmacy.

## 2018-07-13 NOTE — Telephone Encounter (Signed)
Pharmacy calling stating that the traMADol (ULTRAM) 50 MG tablet is on back order they don't know when they will receive it and would like for provider to send order to [facility] [place]. Called pt and told him that would send request back to office so they can reorder at the [facility] [place]. Routing request to provider.  [place] and pharmacist stated that the pt very anxious to get this filled.  Reason for Disposition . Caller has URGENT medication question about med that PCP prescribed and triager unable to answer question  Protocols used: MEDICATION QUESTION CALL-A-AH

## 2018-07-13 NOTE — Telephone Encounter (Signed)
Copied from [place] [phone]. Topic: Quick Communication - Rx Refill/Question >> Jul 13, 2018 10:06 AM Leward Quan A wrote: Medication: traMADol (ULTRAM) 50 MG tablet   Per patient he need this Rx filled today because he is all out  Has the patient contacted their pharmacy? Yes.   (Agent: If no, request that the patient contact the pharmacy for the refill.) (Agent: If yes, when and what did the pharmacy advise?)  Preferred Pharmacy (with phone number or street name): [id number] [place], [place] - [address] [phone] (Phone) [phone] (Fax)    Agent: Please be advised that RX refills may take up to 3 business days. We ask that you follow-up with your pharmacy.

## 2018-07-14 NOTE — Telephone Encounter (Signed)
Already resent again for second time.

## 2018-07-14 NOTE — Telephone Encounter (Signed)
Left pt  Message notifying [facility]

## 2018-07-21 ENCOUNTER — Other Ambulatory Visit: Payer: Self-pay | Admitting: Medical

## 2018-07-28 ENCOUNTER — Other Ambulatory Visit: Payer: Self-pay | Admitting: *Deleted

## 2018-07-28 DIAGNOSIS — I82591 Chronic embolism and thrombosis of other specified deep vein of right lower extremity: Secondary | ICD-10-CM

## 2018-07-29 ENCOUNTER — Inpatient Hospital Stay: Payer: Medicare Other

## 2018-07-29 ENCOUNTER — Inpatient Hospital Stay: Payer: Medicare Other | Attending: Hematology & Oncology | Admitting: Hematology & Oncology

## 2018-07-29 VITALS — BP 139/71 | HR 75 | Temp 98.1°F | Resp 18 | Wt 185.2 lb

## 2018-07-29 DIAGNOSIS — Z7901 Long term (current) use of anticoagulants: Secondary | ICD-10-CM

## 2018-07-29 DIAGNOSIS — G629 Polyneuropathy, unspecified: Secondary | ICD-10-CM | POA: Diagnosis not present

## 2018-07-29 DIAGNOSIS — Z86718 Personal history of other venous thrombosis and embolism: Secondary | ICD-10-CM | POA: Diagnosis not present

## 2018-07-29 DIAGNOSIS — M7989 Other specified soft tissue disorders: Secondary | ICD-10-CM | POA: Diagnosis not present

## 2018-07-29 DIAGNOSIS — I82591 Chronic embolism and thrombosis of other specified deep vein of right lower extremity: Secondary | ICD-10-CM

## 2018-07-29 DIAGNOSIS — Z7982 Long term (current) use of aspirin: Secondary | ICD-10-CM

## 2018-07-29 DIAGNOSIS — G2581 Restless legs syndrome: Secondary | ICD-10-CM

## 2018-07-29 LAB — CBC WITH DIFFERENTIAL (CANCER CENTER ONLY)
Abs Immature Granulocytes: 0.03 10*3/uL (ref 0.00–0.07)
Basophils Absolute: 0 10*3/uL (ref 0.0–0.1)
Basophils Relative: 1 %
EOS ABS: 0.1 10*3/uL (ref 0.0–0.5)
Eosinophils Relative: 1 %
HCT: 38.5 % — ABNORMAL LOW (ref 39.0–52.0)
Hemoglobin: 12.5 g/dL — ABNORMAL LOW (ref 13.0–17.0)
Immature Granulocytes: 1 %
Lymphocytes Relative: 23 %
Lymphs Abs: 1.4 10*3/uL (ref 0.7–4.0)
MCH: 33.1 pg (ref 26.0–34.0)
MCHC: 32.5 g/dL (ref 30.0–36.0)
MCV: 101.9 fL — ABNORMAL HIGH (ref 80.0–100.0)
Monocytes Absolute: 0.7 10*3/uL (ref 0.1–1.0)
Monocytes Relative: 11 %
Neutro Abs: 4.1 10*3/uL (ref 1.7–7.7)
Neutrophils Relative %: 63 %
Platelet Count: 248 10*3/uL (ref 150–400)
RBC: 3.78 MIL/uL — ABNORMAL LOW (ref 4.22–5.81)
RDW: 12.2 % (ref 11.5–15.5)
WBC Count: 6.3 10*3/uL (ref 4.0–10.5)
nRBC: 0 % (ref 0.0–0.2)

## 2018-07-29 NOTE — Progress Notes (Signed)
Hematology and Oncology Follow Up Visit  Brandon Mason [id number] 1926/03/09 85 y.o. 07/29/2018   Principle Diagnosis:  DVT of right lower extremity   Current Therapy:   Eliquis 5 mg PO BID - completed one full year in January 2020 Aspirin 325 mg p.o. daily --restarted on 08/10/2018   Interim History: Brandon Mason is here today for follow-up.  He is hanging in there.  He is doing okay.  He is worried more about his wife than himself.  He has a lot of swelling in his legs.  I suspect that this is edema.  He is on hydrochlorothiazide.  I do think it would be a problem to double his dose of hydrochlorothiazide to 50 mg a day.  He has completed a year of Eliquis.  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  He will always have this.  I think we can stop his Eliquis.  He wants to go back onto his aspirin.  He thinks this will help his restless legs.  He has neuropathy.  I think that aspirin will help.  I told him that he needs to take the aspirin with food, preferably in the morning when he is up and about.    He has had no bleeding.  There has been no change in bowel or bladder habits.  He has had no cough.  There has been no chest wall pain.  Overall, his performance status is ECOG 1-2.   Medications:  Allergies as of 07/29/2018   No Known Allergies     Medication List       Accurate as of July 29, 2018  2:42 PM. Always use your most recent med list.        amLODipine 10 MG tablet Commonly known as:  NORVASC TAKE 1 TABLET BY MOUTH ONCE DAILY   ELIQUIS 5 MG Tabs tablet Generic drug:  apixaban TAKE 1 TABLET BY MOUTH TWICE DAILY   gabapentin 600 MG tablet Commonly known as:  NEURONTIN TAKE 1 TABLET BY MOUTH THREE TIMES DAILY   hydrochlorothiazide 25 MG tablet Commonly known as:  HYDRODIURIL TAKE 1 TABLET BY MOUTH ONCE DAILY   lisinopril 20 MG tablet Commonly known as:  PRINIVIL,ZESTRIL TAKE 1 TABLET BY MOUTH ONCE DAILY     nystatin cream Commonly known as:  MYCOSTATIN Apply 1 application topically 2 (two) times daily.   predniSONE 10 MG tablet Commonly known as:  DELTASONE 4 tablets x 2 days, 3 tabs x 2 days, 2 tabs x 2 days, 1 tab x 2 days   rOPINIRole 4 MG tablet Commonly known as:  REQUIP TAKE 1 TABLET BY MOUTH AT BEDTIME   traMADol 50 MG tablet Commonly known as:  ULTRAM Take 1 tablet (50 mg total) by mouth every 8 (eight) hours as needed.       Allergies: No Known Allergies  Past Medical History, Surgical history, Social history, and Family History were reviewed and updated.  Review of Systems: All other 10 point review of systems is negative.   Physical Exam:  weight is 185 lb 4 oz (84 kg). His oral temperature is 98.1 F (36.7 C). His blood pressure is 139/71 and his pulse is 75. His respiration is 18 and oxygen saturation is 95%.   Wt Readings from Last 3 Encounters:  07/29/18 185 lb 4 oz (84 kg)  05/18/18 180 lb (81.6 kg)  03/30/18 181 lb 12.8 oz (82.5 kg)    Physical Exam  Vitals signs reviewed.  HENT:     Head: Normocephalic and atraumatic.  Eyes:     Pupils: Pupils are equal, round, and reactive to light.  Neck:     Musculoskeletal: Normal range of motion.  Cardiovascular:     Rate and Rhythm: Normal rate and regular rhythm.     Heart sounds: Normal heart sounds.  Pulmonary:     Effort: Pulmonary effort is normal.     Breath sounds: Normal breath sounds.  Abdominal:     General: Bowel sounds are normal.     Palpations: Abdomen is soft.  Musculoskeletal: Normal range of motion.        General: No tenderness or deformity.     Comments: Extremities shows 2+ edema in his lower legs.  This is pitting edema.  I see no erythema.  He has no joint swelling.  Lymphadenopathy:     Cervical: No cervical adenopathy.  Skin:    General: Skin is warm and dry.     Findings: No erythema or rash.  Neurological:     Mental Status: He is alert and oriented to person, place, and  time.  Psychiatric:        Behavior: Behavior normal.        Thought Content: Thought content normal.        Judgment: Judgment normal.      Lab Results  Component Value Date   WBC 6.3 07/29/2018   HGB 12.5 (L) 07/29/2018   HCT 38.5 (L) 07/29/2018   MCV 101.9 (H) 07/29/2018   PLT 248 07/29/2018   Lab Results  Component Value Date   FERRITIN 179.3 12/31/2017   IRON 112 12/31/2017   TIBC 284 12/31/2017   UIBC 172 12/31/2017   IRONPCTSAT 39 12/31/2017   Lab Results  Component Value Date   RBC 3.78 (L) 07/29/2018   No results found for: Nils Pyle [facility] Lab Results  Component Value Date   IGGSERUM 1,220 09/13/2015   IGA 244 09/13/2015   IGMSERUM 59 09/13/2015   Lab Results  Component Value Date   TOTALPROTELP 6.5 09/13/2015   ALBUMINELP 3.7 (L) 09/13/2015   A1GS 0.4 (H) 09/13/2015   A2GS 0.7 09/13/2015   BETS 0.4 09/13/2015   BETA2SER 0.3 09/13/2015   GAMS 1.0 09/13/2015   MSPIKE Not Observed 05/14/2013   SPEI SEE NOTE 09/13/2015     Chemistry      Component Value Date/Time   NA 140 05/18/2018 1153   K 4.0 05/18/2018 1153   CL 102 05/18/2018 1153   CO2 31 05/18/2018 1153   BUN 32 (H) 05/18/2018 1153   CREATININE 1.16 05/18/2018 1153   CREATININE 0.90 01/20/2018 1402      Component Value Date/Time   CALCIUM 9.3 05/18/2018 1153   ALKPHOS 65 02/23/2018 1136   AST 13 02/23/2018 1136   AST 15 01/20/2018 1402   ALT 12 02/23/2018 1136   ALT 12 01/20/2018 1402   BILITOT 0.7 02/23/2018 1136   BILITOT 0.4 01/20/2018 1402      Impression and Plan: Brandon Mason is a very pleasant 85 yo caucasian gentleman with right lower extremity thrombus, 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 He has tolerated treatment with Eliquis nicely and has no complaints at this time.   We will stop his Eliquis.  He has been on this for a year.  He wants to go back onto his aspirin.  I think that would be okay.  I told him to make sure he  takes aspirin with  food.  He has a lot of swelling in his legs.  I think this is edema.  I told him that he could double his dose of hydrochlorothiazide to 50 mg a day and this might help with some of the swelling.  At this point, I do not think I do see him back officially.  He comes with his wife.  If he has any issues I can always help him out when he comes with his wife for her office visits.    Volanda Napoleon, MD 1/29/20202:42 PM

## 2018-08-03 ENCOUNTER — Telehealth: Payer: Self-pay | Admitting: *Deleted

## 2018-08-03 NOTE — Telephone Encounter (Signed)
Received Physician Orders from [place] Team for PAP Supplies; forwarded to provider/SLS 02/03

## 2018-08-04 ENCOUNTER — Other Ambulatory Visit: Payer: Self-pay | Admitting: Medical

## 2018-08-18 ENCOUNTER — Other Ambulatory Visit: Payer: Self-pay | Admitting: Medical

## 2018-08-19 NOTE — Telephone Encounter (Signed)
I did refill pt tramadol today. Pt up to date on uds, csc and reviewed csr website.

## 2018-08-19 NOTE — Telephone Encounter (Signed)
Refill Request: Tramadol  Last RX:07/13/18 Last OV:03/30/18 Next OV: none scheduled  UDS:10/22/17 CSC:10/22/17 CSR:

## 2018-09-09 ENCOUNTER — Ambulatory Visit: Payer: Self-pay

## 2018-09-09 NOTE — Telephone Encounter (Signed)
Patient called in with c/o "blood in urine." He says "the bleeding started 3 days ago, not every time I urinated was bloody. This morning when I went, it was clear. I was going to call, but I thought it had stopped. Then I just went and it was bloody with blood clots, a big clot. I don't have pain, no fever. I do feel like I'm not emptying my bladder completely." I asked about other symptoms, he denies. According to protocol, see PCP within 24 hours. He says "I just want a urology referral. Why do I have to come in?" I advised unless he has a urologist that he's seeing on a regular basis, he will need to come in to be evaluated by Percell Miller and a referral placed at that time. He verbalized understanding, appointment scheduled for tomorrow, 09/10/18 at 1020 with Mackie Pai, [facility], care advice given, patient verbalized understanding.  Reason for Disposition . Blood in urine  (Exception: could be normal menstrual bleeding)  Answer Assessment - Initial Assessment Questions 1. COLOR of URINE: "Describe the color of the urine."  (e.g., tea-colored, pink, red, blood clots, bloody)     Bright red with clots 2. ONSET: "When did the bleeding start?"      3 days ago 3. EPISODES: "How many times has there been blood in the urine?" or "How many times today?"     Once today 4. PAIN with URINATION: "Is there any pain with passing your urine?" If so, ask: "How bad is the pain?"  (Scale 1-10; or mild, moderate, severe)    - MILD - complains slightly about urination hurting    - MODERATE - interferes with normal activities      - SEVERE - excruciating, unwilling or unable to urinate because of the pain      No 5. FEVER: "Do you have a fever?" If so, ask: "What is your temperature, how was it measured, and when did it start?"     No 6. ASSOCIATED SYMPTOMS: "Are you passing urine more frequently than usual?"     No 7. OTHER SYMPTOMS: "Do you have any other symptoms?" (e.g., back/flank pain, abdominal pain,  vomiting)     No 8. PREGNANCY: "Is there any chance you are pregnant?" "When was your last menstrual period?"     N/A  Protocols used: URINE - BLOOD IN-A-AH

## 2018-09-10 ENCOUNTER — Ambulatory Visit (INDEPENDENT_AMBULATORY_CARE_PROVIDER_SITE_OTHER): Payer: Medicare Other | Admitting: Medical

## 2018-09-10 ENCOUNTER — Encounter: Payer: Self-pay | Admitting: Medical

## 2018-09-10 ENCOUNTER — Other Ambulatory Visit: Payer: Self-pay

## 2018-09-10 VITALS — BP 129/73 | HR 87 | Temp 98.4°F | Resp 16 | Ht 68.0 in | Wt 181.4 lb

## 2018-09-10 DIAGNOSIS — R319 Hematuria, unspecified: Secondary | ICD-10-CM

## 2018-09-10 DIAGNOSIS — R350 Frequency of micturition: Secondary | ICD-10-CM | POA: Diagnosis not present

## 2018-09-10 DIAGNOSIS — R300 Dysuria: Secondary | ICD-10-CM

## 2018-09-10 LAB — POC URINALSYSI DIPSTICK (AUTOMATED)
Bilirubin, UA: NEGATIVE
Blood, UA: NEGATIVE
Glucose, UA: NEGATIVE
Ketones, UA: NEGATIVE
Leukocytes, UA: NEGATIVE
Nitrite, UA: NEGATIVE
Protein, UA: NEGATIVE
SPEC GRAV UA: 1.02 (ref 1.010–1.025)
Urobilinogen, UA: NEGATIVE E.U./dL — AB
pH, UA: 6 (ref 5.0–8.0)

## 2018-09-10 LAB — PSA: PSA: 2.45 ng/mL (ref 0.10–4.00)

## 2018-09-10 MED ORDER — CIPROFLOXACIN HCL 500 MG PO TABS: 500.0000 mg | ORAL_TABLET | Freq: Two times a day (BID) | ORAL | 0 refills | Status: DC

## 2018-09-10 NOTE — Progress Notes (Signed)
Subjective:    Patient ID: Brandon Mason, male    DOB: Nov 06, 1925, 85 y.o.   MRN: [id number]  HPI  Pt in for evaluation.  He states he has intermittent appearance of blood in urine at times over past 2 weeks. On today urine test  in office no gross blood and ua test came back negative for blood. He states last 4 days will see intermittent gross blood. Small clots described. No fever, no chills or sweats. Maybe some mild dysuria.   Pt in the past had xarelto use in past which caused severe gross blood in urine. Dr. Marin Olp had advised switching to eliquis in past and blood resolved.Marland Kitchen He stopped eliquis for 2 months. Now only on aspirin 325 mg.   Pt has no history of smoking.  Some frequent urination. States urinates about 5 times a day. Not sure if above baseline.   Review of Systems  Constitutional: Negative for chills, fatigue and fever.  Respiratory: Negative for cough, chest tightness, shortness of breath and wheezing.   Cardiovascular: Negative for chest pain and palpitations.  Gastrointestinal: Negative for abdominal pain, blood in stool, rectal pain and vomiting.  Genitourinary: Positive for dysuria, frequency and hematuria.       See hpi.  Musculoskeletal: Negative for back pain.  Hematological: Negative for adenopathy. Does not bruise/bleed easily.    Past Medical History:  Diagnosis Date  . Abnormality of gait 02/09/2013  . Arthritis   . Cancer ([place])    basal cell skin cancer  . Cardiomegaly   . Chronic kidney disease    BPH  . Chronic leg pain   . Hypersomnia   . Hypertension   . Hypoxia    pulmonary  . Neuromuscular disorder (HCC)    restless leg syndrome   . Nocturia   . Prostate disorder   . Restless legs syndrome (RLS) 02/09/2013  . Restless legs syndrome with nocturnal myoclonus   . RLS (restless legs syndrome)    x 40 years  . Shortness of breath    with exertion   . Sleep apnea    uses cpap  . Stroke [facility])    minor stroke in 2003 - no lasting  side effects  . UARS (upper airway resistance syndrome) 06/28/2014     Social History   Socioeconomic History  . Marital status: Married    Spouse name: [name]  . Number of children: 5  . Years of education: 6  . Highest education level: Not on file  Occupational History  . 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 Financial resource strain: Not on file  . Food insecurity:    Worry: Not on file    Inability: Not on file  . Transportation needs:    Medical: Not on file    Non-medical: Not on file  Tobacco Use  . Smoking status: Never Smoker  . Smokeless tobacco: Never Used  Substance and Sexual Activity  . Alcohol use: Yes    Alcohol/week: 2.0 standard drinks    Types: 2 Cans of beer per week    Comment: weekly  . Drug use: No  . Sexual activity: Not on file  Lifestyle  . Physical activity:    Days per week: Not on file    Minutes per session: Not on file  . Stress: Not on file  Relationships  . Social connections:    Talks on phone: Not on file  Gets together: Not on file    Attends religious service: Not on file    Active member of club or organization: Not on file    Attends meetings of clubs or organizations: Not on file    Relationship status: Not on file  . Intimate partner violence:    Fear of current or ex partner: Not on file    Emotionally abused: Not on file    Physically abused: Not on file    Forced sexual activity: Not on file  Other Topics Concern  . Not on file  Social History Narrative   Patient lives at home with his wife [name].   Patient is retired.    Patient has 5 children.   Patient has a high school education.   Patient is right-handed.   Patient drinks very little caffeine.    Past Surgical History:  Procedure Laterality Date  . ANTERIOR CERVICAL DECOMP/DISCECTOMY FUSION N/A 03/29/2016   Procedure: ANTERIOR CERVICAL DECOMPRESSION/DISCECTOMY FUSION CERVICAL THREE- CERVICAL FOUR;  Surgeon: Ashok Pall, MD;  Location: [place] [facility];  Service: Neurosurgery;  Laterality: N/A;  . BACK SURGERY  2003   L4-5  . CATARACT EXTRACTION Bilateral   . HERNIA REPAIR     inguinal hernia  . TRANSURETHRAL PROSTATECTOMY WITH GYRUS INSTRUMENTS  04/28/2012   Procedure: TRANSURETHRAL PROSTATECTOMY WITH GYRUS INSTRUMENTS;  Surgeon: Fredricka Bonine, MD;  Location: [facility];  Service: Urology;  Laterality: N/A;  . TRANSURETHRAL RESECTION OF PROSTATE  03/24/2012   Procedure: TRANSURETHRAL RESECTION OF THE PROSTATE (TURP);  Surgeon: Fredricka Bonine, MD;  Location: [facility];  Service: Urology;  Laterality: N/A;  with gyrus ,Greenlight PVP laser of Prostate    Family History  Problem Relation Age of Onset  . Restless legs syndrome Mother   . Restless legs syndrome Daughter   . Esophageal cancer Neg Hx   . Colon cancer Neg Hx   . Pancreatic cancer Neg Hx   . Stomach cancer Neg Hx     No Known Allergies  Current Outpatient Medications on File Prior to Visit  Medication Sig Dispense Refill  . amLODipine (NORVASC) 10 MG tablet TAKE 1 TABLET BY MOUTH ONCE DAILY 90 tablet 0  . gabapentin (NEURONTIN) 600 MG tablet TAKE 1 TABLET BY MOUTH THREE TIMES DAILY 270 tablet 0  . hydrochlorothiazide (HYDRODIURIL) 25 MG tablet TAKE 1 TABLET BY MOUTH ONCE DAILY 90 tablet 0  . lisinopril (PRINIVIL,ZESTRIL) 20 MG tablet TAKE 1 TABLET BY MOUTH ONCE DAILY 90 tablet 0  . rOPINIRole (REQUIP) 4 MG tablet TAKE 1 TABLET BY MOUTH AT BEDTIME 30 tablet 5  . traMADol (ULTRAM) 50 MG tablet TAKE 1 TABLET (50 MG TOTAL) BY MOUTH EVERY 8 (EIGHT) HOURS AS NEEDED. 90 tablet 0   No current facility-administered medications on file prior to visit.     BP 129/73   Pulse 87   Temp 98.4 F (36.9 C) (Oral)   Resp 16   Ht 5\' 8"  (1.727 m)   Wt 181 lb 6.4 oz (82.3 kg)   SpO2 96%   BMI 27.58 kg/m       Objective:   Physical Exam  General Appearance- Not in acute distress.  HEENT Eyes- Scleraeral/Conjuntiva-bilat- Not  Yellow. Mouth & Throat- Normal.  Chest and Lung Exam Auscultation: Breath sounds:-Normal. Adventitious sounds:- No Adventitious sounds.  Cardiovascular Auscultation:Rythm - Regular. Heart Sounds -Normal heart sounds.  Abdomen Inspection:-Inspection Normal.  Palpation/Perucssion: Palpation and Percussion of the abdomen reveal- mild suprapubic Tenderness, No Rebound tenderness,  No rigidity(Guarding) and No Palpable abdominal masses.  Liver:-Normal.  Spleen:- Normal.   Back- no cva tenderness        Assessment & Plan:  You had some recent intermittent gross hematuria with some mild dysuria and possible frequent urination.  Will get urine culture today and get a PSA blood test.  Will have you start Cipro antibiotic and recommend staying well-hydrated.  You do have a history of gross hematuria in the past with Xarelto and then that resolved when you were given Eliquis.  Now you are off Eliquis but have recurrent gross hematuria.  In light of this history I do think is a good idea for you to be referred to urologist for evaluation.  They might do cystoscopy.  Referral placed today.  Urologist office should be calling you within the next week or 2.  If not then please call our office for update on referral.  Follow-up in 10 to 14 days or as needed.  Mackie Pai, [name]

## 2018-09-10 NOTE — Patient Instructions (Signed)
You had some recent intermittent gross hematuria with some mild dysuria and possible frequent urination.  Will get urine culture today and get a PSA blood test.  Will have you start Cipro antibiotic and recommend staying well-hydrated.  You do have a history of gross hematuria in the past with Xarelto and then that resolved when you were given Eliquis.  Now you are off Eliquis but have recurrent gross hematuria.  In light of this history I do think is a good idea for you to be referred to urologist for evaluation.  They might do cystoscopy.  Referral placed today.  Urologist office should be calling you within the next week or 2.  If not then please call our office for update on referral.  Follow-up in 10 to 14 days or as needed.

## 2018-09-11 LAB — URINE CULTURE
MICRO NUMBER:: 311648
SPECIMEN QUALITY:: ADEQUATE

## 2018-09-13 ENCOUNTER — Other Ambulatory Visit: Payer: Self-pay | Admitting: Medical

## 2018-09-25 ENCOUNTER — Other Ambulatory Visit: Payer: Self-pay | Admitting: Medical

## 2018-10-02 ENCOUNTER — Telehealth: Payer: Self-pay | Admitting: Medical

## 2018-10-05 ENCOUNTER — Other Ambulatory Visit: Payer: Self-pay

## 2018-10-05 NOTE — Telephone Encounter (Signed)
Pt wants tramadol refill. Would like him to have virtual video visit or at least telephone visit. He is due for uds but in light of viral pandemic won't make him come in to give uds but at least want him to have visit. Is he up to date on contract? I am will see patients in afternoon Tuesday, wed, and Thursday.

## 2018-10-05 NOTE — Progress Notes (Unsigned)
Ordered small amount until provider returns. Please advise.

## 2018-10-05 NOTE — Telephone Encounter (Signed)
Schedule currently blocked.

## 2018-10-05 NOTE — Telephone Encounter (Signed)
Please contact patient. He was advised that he will need at least a telephone visit to get traMADol (ULTRAM) 50 MG tablet   refilled. He is completely out.Unable to contact office. Phone # listed is correct. He was told that he would be contacted today

## 2018-10-06 ENCOUNTER — Ambulatory Visit (INDEPENDENT_AMBULATORY_CARE_PROVIDER_SITE_OTHER): Payer: Medicare Other | Admitting: Medical

## 2018-10-06 ENCOUNTER — Other Ambulatory Visit: Payer: Self-pay

## 2018-10-06 DIAGNOSIS — R350 Frequency of micturition: Secondary | ICD-10-CM

## 2018-10-06 DIAGNOSIS — G2581 Restless legs syndrome: Secondary | ICD-10-CM | POA: Diagnosis not present

## 2018-10-06 DIAGNOSIS — I10 Essential (primary) hypertension: Secondary | ICD-10-CM

## 2018-10-06 MED ORDER — TRAMADOL HCL 50 MG PO TABS: ORAL_TABLET | ORAL | 0 refills | Status: DC

## 2018-10-06 NOTE — Telephone Encounter (Signed)
Pt would like a telephone visit today

## 2018-10-06 NOTE — Patient Instructions (Addendum)
For restless leg syndrome refilled his tramadol rx. Explained he is do for uds but will not make him come in presently due to viral pandemic. But in about 3 months or so when safer to come out will ask him to come by to give uds.  Frequent urination resolved. No tx needed.  Htn. Continue current meds. Last bp check in office controlled. Check at home occasionally but make sure he checks when he is relaxed.  Follow up in about 3 months or as needed

## 2018-10-06 NOTE — Telephone Encounter (Signed)
Telephone visit scheduled

## 2018-10-06 NOTE — Progress Notes (Signed)
   Subjective:    Patient ID: Brandon Mason, male    DOB: [birth year], 85 y.o.   MRN: [id number]  HPI   Virtual Visit via Telephone Note  I connected with [name] on 10/06/18 at  1:00 PM EDT by telephone and verified that I am speaking with the correct person using two identifiers.   I discussed the limitations, risks, security and privacy concerns of performing an evaluation and management service by telephone and the availability of in person appointments. I also discussed with the patient that there may be a patient responsible charge related to this service. The patient expressed understanding and agreed to proceed.     History of Present Illness: Pt states he still having problems with his restless legs but only very rarely. One time in past month had 2 nights in a row that could barely sleep.  But he states most of time he does well with current regimen of tramadol.  He states having severe difficulty with restless legs is very rare. Most of time does fine with current tramadol use.   Pt had some frequent urination last month and I rx'd cipro pending the culture Culture was negative and all his symptoms got better.  Pt last bp in office was 129/73.     Observations/Objective: NA  Assessment and Plan: For restless leg syndrome refilled his tramadol rx. Explained he is do for uds but will not make him come in presently due to viral pandemic. But in about 3 months or so when safer to come out will ask him to come by to give uds.  Frequent urination resolved. No tx needed.  Htn. Continue current meds. Last bp check in office controlled. Check at home occasionally but make sure he checks when he is relaxed.  Follow up in about 3 months or as needed  Follow Up Instructions:    I discussed the assessment and treatment plan with the patient. The patient was provided an opportunity to ask questions and all were answered. The patient agreed with the plan and demonstrated  an understanding of the instructions.   The patient was advised to call back or seek an in-person evaluation if the symptoms worsen or if the condition fails to improve as anticipated.  I provided 15 minutes of non-face-to-face time during this encounter.   Mackie Pai, [name]    Review of Systems  Constitutional: Negative for chills, fatigue and fever.  Respiratory: Negative for cough, chest tightness, shortness of breath and wheezing.        Objective:   Physical Exam NA.       Assessment & Plan:

## 2018-10-24 ENCOUNTER — Other Ambulatory Visit: Payer: Self-pay | Admitting: Medical

## 2018-11-07 ENCOUNTER — Other Ambulatory Visit: Payer: Self-pay | Admitting: Medical

## 2018-11-09 NOTE — Telephone Encounter (Signed)
Requesting: Tramadol  Contract: 10/22/2017 UDS: 10/22/2017 Last OV: 10/06/2018 Next OV: N/A Last Refill: 08/19/2018, #90--0 RF Database:   Please advise

## 2018-11-15 ENCOUNTER — Other Ambulatory Visit: Payer: Self-pay | Admitting: Medical

## 2018-12-14 ENCOUNTER — Other Ambulatory Visit: Payer: Self-pay | Admitting: Medical

## 2018-12-14 NOTE — Telephone Encounter (Addendum)
Refill Request: Tramadol   Last RX:11/09/18 Last OV:10/06/18 Next OV: None scheduled  UDS:10/23/18 CSC: 10/23/18 CSR: 12/14/2018 espac.  Rx sent to pharmacy today.

## 2018-12-27 ENCOUNTER — Other Ambulatory Visit: Payer: Self-pay | Admitting: Medical

## 2019-01-11 ENCOUNTER — Ambulatory Visit (HOSPITAL_BASED_OUTPATIENT_CLINIC_OR_DEPARTMENT_OTHER)
Admission: RE | Admit: 2019-01-11 | Discharge: 2019-01-11 | Disposition: A | Discharge status: Home / Self Care | Payer: Medicare Other | Source: Ambulatory Visit | Attending: Medical | Admitting: Medical

## 2019-01-11 ENCOUNTER — Encounter: Payer: Self-pay | Admitting: Medical

## 2019-01-11 ENCOUNTER — Ambulatory Visit (INDEPENDENT_AMBULATORY_CARE_PROVIDER_SITE_OTHER): Payer: Medicare Other | Admitting: Medical

## 2019-01-11 ENCOUNTER — Other Ambulatory Visit: Payer: Self-pay | Admitting: Medical

## 2019-01-11 ENCOUNTER — Other Ambulatory Visit: Payer: Self-pay

## 2019-01-11 VITALS — BP 110/70 | HR 86 | Temp 98.1°F | Resp 16 | Ht 68.0 in | Wt 184.4 lb

## 2019-01-11 DIAGNOSIS — R600 Localized edema: Secondary | ICD-10-CM

## 2019-01-11 DIAGNOSIS — M25552 Pain in left hip: Secondary | ICD-10-CM | POA: Diagnosis not present

## 2019-01-11 DIAGNOSIS — M5442 Lumbago with sciatica, left side: Secondary | ICD-10-CM

## 2019-01-11 DIAGNOSIS — I10 Essential (primary) hypertension: Secondary | ICD-10-CM

## 2019-01-11 DIAGNOSIS — M1612 Unilateral primary osteoarthritis, left hip: Secondary | ICD-10-CM | POA: Diagnosis not present

## 2019-01-11 DIAGNOSIS — M545 Low back pain: Secondary | ICD-10-CM | POA: Diagnosis not present

## 2019-01-11 MED ORDER — PREDNISONE 10 MG PO TABS: ORAL_TABLET | ORAL | 0 refills | Status: DC

## 2019-01-11 NOTE — Patient Instructions (Signed)
With your recent sciatica type pain and left hip pain we will get x-rays of both region.  Your lumbar spine MRI in the past does indicate/so some findings which match so your symptoms.  We will see if interval x-ray of lumbar spine shows any worsening changes.  Also see if your left hip joint space is narrowed.  I want you to continue current medication regimen but will add 5 days tapered dose of prednisone to see if this helps with your symptoms.  If symptoms persist or worsen then might consider sports medicine referral.  Your lower extremity pedal edema is stable.  Prior work-up last year did not show CHF flare.  Based on today's exam I am not concerned about DVT.  I do think the edema is dependent/gravity type.  Recommend elevate your legs and you could try compression stockings.  If edema gets worse please let me know.  Follow-up in 2 weeks or as needed.

## 2019-01-11 NOTE — Progress Notes (Signed)
Subjective:    Patient ID: Brandon Mason, male    DOB: [birth year], 85 y.o.   MRN: [id number]  HPI  Pt in with back pain, left hip pain and radiating pain that goes all the way down to hip. He has restless leg and he states this pain is different. This pain is for about 4 days.   Mri 2018 IMPRESSION: 1. Advanced diffuse lumbar disc degeneration. Mildly increased degenerative edema along the L5 inferior endplate. 2. New small left foraminal disc protrusion at L3-4 with moderate foraminal stenosis. 3. Unchanged moderate to severe lateral recess and neural foraminal stenosis elsewhere as above.  Hx of dependant edema. On year ago cxr showed cardiomegaly but no chf. No popliteal pain.   Htn hx. Pt checks hs bp at [facility] 150/70-90   Review of Systems  Constitutional: Negative for chills, fatigue and fever.  Respiratory: Negative for cough, chest tightness, shortness of breath and wheezing.   Cardiovascular: Negative for chest pain and palpitations.  Gastrointestinal: Negative for abdominal pain, constipation, nausea and vomiting.  Musculoskeletal: Positive for back pain.       Hip pain. Radiating pain.  Skin: Negative for rash.  Neurological: Negative for tremors, facial asymmetry, speech difficulty and weakness.  Hematological: Negative for adenopathy. Does not bruise/bleed easily.  Psychiatric/Behavioral: Negative for decreased concentration.    Past Medical History:  Diagnosis Date  . Abnormality of gait 02/09/2013  . Arthritis   . Cancer ([place])    basal cell skin cancer  . Cardiomegaly   . Chronic kidney disease    BPH  . Chronic leg pain   . Hypersomnia   . Hypertension   . Hypoxia    pulmonary  . Neuromuscular disorder (HCC)    restless leg syndrome   . Nocturia   . Prostate disorder   . Restless legs syndrome (RLS) 02/09/2013  . Restless legs syndrome with nocturnal myoclonus   . RLS (restless legs syndrome)    x 40 years  . Shortness of breath    with  exertion   . Sleep apnea    uses cpap  . Stroke [facility])    minor stroke in 2003 - no lasting side effects  . UARS (upper airway resistance syndrome) 06/28/2014     Social History   Socioeconomic History  . Marital status: Married    Spouse name: [name]  . Number of children: 5  . Years of education: 41  . Highest education level: Not on file  Occupational History  . 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 Financial resource strain: Not on file  . Food insecurity    Worry: Not on file    Inability: Not on file  . Transportation needs    Medical: Not on file    Non-medical: Not on file  Tobacco Use  . Smoking status: Never Smoker  . Smokeless tobacco: Never Used  Substance and Sexual Activity  . Alcohol use: Yes    Alcohol/week: 2.0 standard drinks    Types: 2 Cans of beer per week    Comment: weekly  . Drug use: No  . Sexual activity: Not on file  Lifestyle  . Physical activity    Days per week: Not on file    Minutes per session: Not on file  . Stress: Not on file  Relationships  . Social Herbalist on phone: Not on file    Gets together:  Not on file    Attends religious service: Not on file    Active member of club or organization: Not on file    Attends meetings of clubs or organizations: Not on file    Relationship status: Not on file  . Intimate partner violence    Fear of current or ex partner: Not on file    Emotionally abused: Not on file    Physically abused: Not on file    Forced sexual activity: Not on file  Other Topics Concern  . Not on file  Social History Narrative   Patient lives at home with his wife [name].   Patient is retired.    Patient has 5 children.   Patient has a high school education.   Patient is right-handed.   Patient drinks very little caffeine.    Past Surgical History:  Procedure Laterality Date  . ANTERIOR CERVICAL DECOMP/DISCECTOMY FUSION N/A 03/29/2016   Procedure:  ANTERIOR CERVICAL DECOMPRESSION/DISCECTOMY FUSION CERVICAL THREE- CERVICAL FOUR;  Surgeon: Ashok Pall, MD;  Location: [place] [facility];  Service: Neurosurgery;  Laterality: N/A;  . BACK SURGERY  2003   L4-5  . CATARACT EXTRACTION Bilateral   . HERNIA REPAIR     inguinal hernia  . TRANSURETHRAL PROSTATECTOMY WITH GYRUS INSTRUMENTS  04/28/2012   Procedure: TRANSURETHRAL PROSTATECTOMY WITH GYRUS INSTRUMENTS;  Surgeon: Fredricka Bonine, MD;  Location: [facility];  Service: Urology;  Laterality: N/A;  . TRANSURETHRAL RESECTION OF PROSTATE  03/24/2012   Procedure: TRANSURETHRAL RESECTION OF THE PROSTATE (TURP);  Surgeon: Fredricka Bonine, MD;  Location: [facility];  Service: Urology;  Laterality: N/A;  with gyrus ,Greenlight PVP laser of Prostate    Family History  Problem Relation Age of Onset  . Restless legs syndrome Mother   . Restless legs syndrome Daughter   . Esophageal cancer Neg Hx   . Colon cancer Neg Hx   . Pancreatic cancer Neg Hx   . Stomach cancer Neg Hx     No Known Allergies  Current Outpatient Medications on File Prior to Visit  Medication Sig Dispense Refill  . amLODipine (NORVASC) 10 MG tablet Take 1 tablet by mouth once daily 90 tablet 0  . ciprofloxacin (CIPRO) 500 MG tablet Take 1 tablet (500 mg total) by mouth 2 (two) times daily. 14 tablet 0  . gabapentin (NEURONTIN) 600 MG tablet TAKE 1 TABLET BY MOUTH THREE TIMES DAILY 270 tablet 0  . hydrochlorothiazide (HYDRODIURIL) 25 MG tablet Take 1 tablet by mouth once daily 90 tablet 0  . lisinopril (ZESTRIL) 20 MG tablet Take 1 tablet by mouth once daily 90 tablet 0  . rOPINIRole (REQUIP) 4 MG tablet TAKE 1 TABLET BY MOUTH AT BEDTIME 30 tablet 5  . traMADol (ULTRAM) 50 MG tablet TAKE 1 TABLET (50 MG TOTAL) BY MOUTH EVERY 8 (EIGHT) HOURS AS NEEDED. 90 tablet 0  . traMADol (ULTRAM) 50 MG tablet TAKE 1 TABLET BY MOUTH EVERY 8 HRS AS NEEDED 90 tablet 0   No current facility-administered medications on file prior to visit.      Ht 5\' 8"  (1.727 m)   Wt 184 lb 6.4 oz (83.6 kg)   BMI 28.04 kg/m       Objective:   Physical Exam   General Appearance- Not in acute distress.    Chest and Lung Exam Auscultation: Breath sounds:-Normal. Clear even and unlabored. Adventitious sounds:- No Adventitious sounds.  Cardiovascular Auscultation:Rythm - Regular, rate and rythm. Heart Sounds -Normal heart sounds.  Abdomen Inspection:-Inspection  Normal.  Palpation/Perucssion: Palpation and Percussion of the abdomen reveal- Non Tender, No Rebound tenderness, No rigidity(Guarding) and No Palpable abdominal masses.  Liver:-Normal.  Spleen:- Normal.   Back Mid lumbar spine tenderness to palpation.  With some left SI area tenderness. Pain on straight leg lift. Pain on lateral movements and flexion/extension of the spine.  Left hip- pain on range of motion.  Lower ext neurologic  L5-S1 sensation intact bilaterally. Normal patellar reflexes bilaterally. No foot drop bilaterally.   Lower extremities-1+ pedal edema bilaterally.  No redness warmth or tenderness to calves.  Negative Homans sign bilaterally.      Assessment & Plan:  With your recent sciatica type pain and left hip pain we will get x-rays of both region.  Your lumbar spine MRI in the past does indicate/so some findings which match so your symptoms.  We will see if interval x-ray of lumbar spine shows any worsening changes.  Also see if your left hip joint space is narrowed.  I want you to continue current medication regimen but will add 5 days tapered dose of prednisone to see if this helps with your symptoms.  If symptoms persist or worsen then might consider sports medicine referral.  Your lower extremity pedal edema is stable.  Prior work-up last year did not show CHF flare.  Based on today's exam I am not concerned about DVT.  I do think the edema is dependent/gravity type.  Recommend elevate your legs and you could try compression stockings.   If edema gets worse please let me know.  Follow-up in 2 weeks or as needed.   25 minutes spent with patient.  50% time spent counseling patient on plan going forward for his condition.  Mackie Pai, [name]

## 2019-01-15 ENCOUNTER — Other Ambulatory Visit: Payer: Self-pay | Admitting: Medical

## 2019-01-15 ENCOUNTER — Telehealth: Payer: Self-pay | Admitting: Medical

## 2019-01-15 DIAGNOSIS — M5442 Lumbago with sciatica, left side: Secondary | ICD-10-CM

## 2019-01-15 NOTE — Telephone Encounter (Signed)
Pt called and stated that he was seen on 01/11/19 for left leg pain. Pt states that he only has two pills of prednisone left and pain is worse than before. Pt would like to know if any pain medication could be called in and suggestions from provider. Please advise   [phone]

## 2019-01-17 MED ORDER — MELOXICAM 7.5 MG PO TABS: ORAL_TABLET | ORAL | 0 refills | Status: DC

## 2019-01-17 NOTE — Telephone Encounter (Signed)
Will you get pt scheduled for nurse visit depomedrol 40 mg im injection for this Tuesday 01/19/2019.

## 2019-01-17 NOTE — Telephone Encounter (Signed)
I talked with pt today. He states severe low back pain with pain shooting to leg on movement. He did not respond to oral prednisone. He has restless leg syndrome and does well with toradol. I did explain that I could rx norco for back pain but he would need to sop toradol. He did not like that idea.   So discussed option meloxicam 1-2 tab a day. Advised first 2 days try 2 tab then after just one due to kidney function. Will try to get pt schedule for depomedrol 40 mg im injection on Tuesday with nurse visit. Also refer to Dr. Barbaraann Barthel.

## 2019-01-17 NOTE — Addendum Note (Signed)
Addended by: Anabel Halon on: 01/17/2019 01:06 PM   Modules accepted: Orders

## 2019-01-18 ENCOUNTER — Encounter: Payer: Self-pay | Admitting: Family Medicine

## 2019-01-18 ENCOUNTER — Other Ambulatory Visit: Payer: Self-pay

## 2019-01-18 ENCOUNTER — Ambulatory Visit (INDEPENDENT_AMBULATORY_CARE_PROVIDER_SITE_OTHER): Payer: Medicare Other | Admitting: Family Medicine

## 2019-01-18 VITALS — BP 119/77 | HR 94 | Ht 68.0 in | Wt 180.0 lb

## 2019-01-18 DIAGNOSIS — M5432 Sciatica, left side: Secondary | ICD-10-CM | POA: Insufficient documentation

## 2019-01-18 MED ORDER — METHYLPREDNISOLONE ACETATE 40 MG/ML IJ SUSP
40.0000 mg | Freq: Once | INTRAMUSCULAR | Status: AC
  Administered 2019-01-18: 40 mg via INTRAMUSCULAR

## 2019-01-18 NOTE — Assessment & Plan Note (Signed)
Having pain that seems sciatic in nature.  Has significant changes in his MRI and imaging of his lumbar spine.   -IM Deprol today. -Counseled on home exercise therapy and supportive care. -Could consider epidural injections if no improvement.

## 2019-01-18 NOTE — Telephone Encounter (Signed)
Scheduled nurse visit.

## 2019-01-18 NOTE — Progress Notes (Signed)
Brandon Mason - 85 y.o. male MRN [id number]  Date of birth: [birth year]  SUBJECTIVE:  Including CC & ROS.  Chief Complaint  Patient presents with  . Back Pain    left-sided low back radiating in left leg to foot    Brandon Mason is a 85 y.o. male that is presenting with left-sided leg pain.  The pain is been ongoing for 2 weeks.  He has completed prednisone with no improvement.  This is similar to the pain he is experienced in the past.  The pain is constant and severe.  He denies inciting event or trauma.  The pain is occurring in the left buttock and radiates down the lateral aspect of left leg.  He denies any falls.  Denies any numbness.  Independent review of the lumbar x-ray from 7/13 shows scoliosis and significant degenerative changes of the lower facet joints and loss of disc height.  Independent review of the left hip x-ray from 7/13 shows no significant degenerative changes.  Independent review of the MRI lumbar spineFrom 2018 shows severe lateral recess and neuroforaminal stenosis at multiple levels with advanced degenerative changes of the lumbar disc degeneration   Review of Systems  Constitutional: Negative for fever.  HENT: Negative for congestion.   Respiratory: Negative for cough.   Cardiovascular: Negative for chest pain.  Gastrointestinal: Negative for abdominal pain.  Musculoskeletal: Positive for arthralgias and back pain.  Skin: Negative for color change.  Neurological: Negative for numbness.  Hematological: Negative for adenopathy.    HISTORY: Past Medical, Surgical, Social, and Family History Reviewed & Updated per EMR.   Pertinent Historical Findings include:  Past Medical History:  Diagnosis Date  . Abnormality of gait 02/09/2013  . Arthritis   . Cancer ([place])    basal cell skin cancer  . Cardiomegaly   . Chronic kidney disease    BPH  . Chronic leg pain   . Hypersomnia   . Hypertension   . Hypoxia    pulmonary  . Neuromuscular disorder (HCC)     restless leg syndrome   . Nocturia   . Prostate disorder   . Restless legs syndrome (RLS) 02/09/2013  . Restless legs syndrome with nocturnal myoclonus   . RLS (restless legs syndrome)    x 40 years  . Shortness of breath    with exertion   . Sleep apnea    uses cpap  . Stroke [facility])    minor stroke in 2003 - no lasting side effects  . UARS (upper airway resistance syndrome) 06/28/2014    Past Surgical History:  Procedure Laterality Date  . ANTERIOR CERVICAL DECOMP/DISCECTOMY FUSION N/A 03/29/2016   Procedure: ANTERIOR CERVICAL DECOMPRESSION/DISCECTOMY FUSION CERVICAL THREE- CERVICAL FOUR;  Surgeon: Ashok Pall, MD;  Location: [place] [facility];  Service: Neurosurgery;  Laterality: N/A;  . BACK SURGERY  2003   L4-5  . CATARACT EXTRACTION Bilateral   . HERNIA REPAIR     inguinal hernia  . TRANSURETHRAL PROSTATECTOMY WITH GYRUS INSTRUMENTS  04/28/2012   Procedure: TRANSURETHRAL PROSTATECTOMY WITH GYRUS INSTRUMENTS;  Surgeon: Fredricka Bonine, MD;  Location: [facility];  Service: Urology;  Laterality: N/A;  . TRANSURETHRAL RESECTION OF PROSTATE  03/24/2012   Procedure: TRANSURETHRAL RESECTION OF THE PROSTATE (TURP);  Surgeon: Fredricka Bonine, MD;  Location: [facility];  Service: Urology;  Laterality: N/A;  with gyrus ,Greenlight PVP laser of Prostate    No Known Allergies  Family History  Problem Relation Age of Onset  . Restless legs  syndrome Mother   . Restless legs syndrome Daughter   . Esophageal cancer Neg Hx   . Colon cancer Neg Hx   . Pancreatic cancer Neg Hx   . Stomach cancer Neg Hx      Social History   Socioeconomic History  . Marital status: Married    Spouse name: [name]  . Number of children: 5  . Years of education: 55  . Highest education level: Not on file  Occupational History  . 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 Financial resource strain: Not on file  . Food insecurity    Worry: Not on file     Inability: Not on file  . Transportation needs    Medical: Not on file    Non-medical: Not on file  Tobacco Use  . Smoking status: Never Smoker  . Smokeless tobacco: Never Used  Substance and Sexual Activity  . Alcohol use: Yes    Alcohol/week: 2.0 standard drinks    Types: 2 Cans of beer per week    Comment: weekly  . Drug use: No  . Sexual activity: Not on file  Lifestyle  . Physical activity    Days per week: Not on file    Minutes per session: Not on file  . Stress: Not on file  Relationships  . Social Herbalist on phone: Not on file    Gets together: Not on file    Attends religious service: Not on file    Active member of club or organization: Not on file    Attends meetings of clubs or organizations: Not on file    Relationship status: Not on file  . Intimate partner violence    Fear of current or ex partner: Not on file    Emotionally abused: Not on file    Physically abused: Not on file    Forced sexual activity: Not on file  Other Topics Concern  . Not on file  Social History Narrative   Patient lives at home with his wife [name].   Patient is retired.    Patient has 5 children.   Patient has a high school education.   Patient is right-handed.   Patient drinks very little caffeine.     PHYSICAL EXAM:  VS: BP 119/77   Pulse 94   Ht 5\' 8"  (1.727 m)   Wt 180 lb (81.6 kg)   BMI 27.37 kg/m  Physical Exam Gen: NAD, alert, cooperative with exam, well-appearing ENT: normal lips, normal nasal mucosa,  Eye: normal EOM, normal conjunctiva and lids CV: pitting edema edema, +2 pedal pulses   Resp: no accessory muscle use, non-labored,   Skin: no rashes, no areas of induration  Neuro: normal tone, normal sensation to touch Psych:  normal insight, alert and oriented MSK:  Back/Left hip:  Tenderness palpation over the greater trochanter and left gluteus. Normal internal/external rotation. Normal strength resistance with hip flexion, knee flexion  extension, plantarflexion and dorsiflexion. Positive straight leg raise. Neurovascular intact   ASSESSMENT & PLAN:   Sciatica of left side Having pain that seems sciatic in nature.  Has significant changes in his MRI and imaging of his lumbar spine.   -IM Deprol today. -Counseled on home exercise therapy and supportive care. -Could consider epidural injections if no improvement.

## 2019-01-18 NOTE — Patient Instructions (Signed)
Nice to meet you Please avoid wearing your wallet in the back pocket  Please try the exercises  Please try heat on the lower back   Please send me a message in MyChart with any questions or updates.  Please see me back in 2-3 weeks.   --Dr. Raeford Razor

## 2019-01-19 ENCOUNTER — Ambulatory Visit: Payer: Medicare Other

## 2019-01-24 ENCOUNTER — Other Ambulatory Visit: Payer: Self-pay | Admitting: Medical

## 2019-01-24 ENCOUNTER — Other Ambulatory Visit: Payer: Self-pay | Admitting: Family Medicine

## 2019-01-25 ENCOUNTER — Other Ambulatory Visit: Payer: Self-pay | Admitting: Family Medicine

## 2019-01-25 ENCOUNTER — Telehealth: Payer: Self-pay | Admitting: Medical

## 2019-01-25 DIAGNOSIS — M5432 Sciatica, left side: Secondary | ICD-10-CM

## 2019-01-25 MED ORDER — TRAMADOL HCL 50 MG PO TABS: ORAL_TABLET | ORAL | 0 refills | Status: DC

## 2019-01-25 NOTE — Telephone Encounter (Signed)
Refill Request: tramadol   Last RX:12/14/18 Last OV:01/11/19 Next IX:MDEK schedule  UDS:10/22/17 CSC:10/22/17 CSR:

## 2019-01-25 NOTE — Telephone Encounter (Signed)
RX tramadol sent in. [place] controlled med site reviewed

## 2019-01-25 NOTE — Progress Notes (Unsigned)
Will proceed with epidural due to ongoing symptoms.   Rosemarie Ax, MD [facility] 01/25/2019, 8:34 AM

## 2019-01-28 ENCOUNTER — Other Ambulatory Visit: Payer: Self-pay | Admitting: Medical

## 2019-01-29 ENCOUNTER — Other Ambulatory Visit: Payer: Self-pay | Admitting: Medical

## 2019-02-02 ENCOUNTER — Ambulatory Visit
Admission: RE | Admit: 2019-02-02 | Discharge: 2019-02-02 | Disposition: A | Discharge status: Home / Self Care | Payer: Medicare Other | Source: Ambulatory Visit | Attending: Family Medicine | Admitting: Family Medicine

## 2019-02-02 ENCOUNTER — Other Ambulatory Visit: Payer: Self-pay

## 2019-02-02 DIAGNOSIS — M5116 Intervertebral disc disorders with radiculopathy, lumbar region: Secondary | ICD-10-CM | POA: Diagnosis not present

## 2019-02-02 DIAGNOSIS — M47816 Spondylosis without myelopathy or radiculopathy, lumbar region: Secondary | ICD-10-CM | POA: Diagnosis not present

## 2019-02-02 DIAGNOSIS — M5432 Sciatica, left side: Secondary | ICD-10-CM

## 2019-02-02 MED ORDER — METHYLPREDNISOLONE ACETATE 40 MG/ML INJ SUSP (RADIOLOG
120.0000 mg | Freq: Once | INTRAMUSCULAR | Status: AC
  Administered 2019-02-02: 120 mg via EPIDURAL

## 2019-02-02 MED ORDER — IOPAMIDOL (ISOVUE-M 200) INJECTION 41%
1.0000 mL | Freq: Once | INTRAMUSCULAR | Status: AC
  Administered 2019-02-02: 1 mL via EPIDURAL

## 2019-02-02 NOTE — Discharge Instructions (Signed)

## 2019-02-21 ENCOUNTER — Other Ambulatory Visit: Payer: Self-pay | Admitting: Medical

## 2019-02-22 NOTE — Telephone Encounter (Addendum)
Refill Request:  Last RX:01/25/19 Last OV:01/11/19 Next [id number] scheduled  UDS:10/28/18 CSC:10/28/18 CSR:02/22/2019

## 2019-03-08 ENCOUNTER — Other Ambulatory Visit: Payer: Self-pay | Admitting: Medical

## 2019-03-22 ENCOUNTER — Other Ambulatory Visit: Payer: Self-pay | Admitting: Medical

## 2019-03-27 ENCOUNTER — Other Ambulatory Visit: Payer: Self-pay | Admitting: Medical

## 2019-04-02 ENCOUNTER — Telehealth: Payer: Self-pay

## 2019-04-02 NOTE — Telephone Encounter (Signed)
Copied from [place] [id number]. Topic: Referral - Request for Referral >> Apr 02, 2019  2:33 PM Rayann Heman wrote: Has patient seen PCP for this complaint? Yes look like in July 2020  Referral for which specialty: neurology Preferred provider/office:  [facility]  Reason for referral:leg pain/loss of feeling

## 2019-04-04 ENCOUNTER — Other Ambulatory Visit: Payer: Self-pay | Admitting: Medical

## 2019-04-04 NOTE — Telephone Encounter (Signed)
Will you ask pt to schedule virtual visit. Want to discuss his pain. He has restless leg syndrome. But also sciatica type symptoms. Not sure neurologist the way to go. Maybe they could help if pain more restless leg but not if sciatica type pain. Based on prior lumbar mri it may be more beneficial to refer to neurosurgeon or pain management.

## 2019-04-05 NOTE — Telephone Encounter (Addendum)
Refill Request: Tramadol   Last RX:02/22/19 Last OV: 01/11/19 Next OV: none scheduled  UDS:10/23/18 CSC:10/23/18 CSR: 04/05/2019  Rx tramadol sent to pharmacy.

## 2019-04-05 NOTE — Telephone Encounter (Signed)
Pt needs an appointment

## 2019-04-06 ENCOUNTER — Ambulatory Visit (HOSPITAL_BASED_OUTPATIENT_CLINIC_OR_DEPARTMENT_OTHER)
Admission: RE | Admit: 2019-04-06 | Discharge: 2019-04-06 | Disposition: A | Discharge status: Home / Self Care | Payer: Medicare Other | Source: Ambulatory Visit | Attending: Medical | Admitting: Medical

## 2019-04-06 ENCOUNTER — Other Ambulatory Visit: Payer: Self-pay

## 2019-04-06 ENCOUNTER — Encounter: Payer: Self-pay | Admitting: Medical

## 2019-04-06 ENCOUNTER — Ambulatory Visit (INDEPENDENT_AMBULATORY_CARE_PROVIDER_SITE_OTHER): Payer: Medicare Other | Admitting: Medical

## 2019-04-06 ENCOUNTER — Telehealth: Payer: Self-pay | Admitting: Medical

## 2019-04-06 DIAGNOSIS — M79662 Pain in left lower leg: Secondary | ICD-10-CM | POA: Insufficient documentation

## 2019-04-06 DIAGNOSIS — M79661 Pain in right lower leg: Secondary | ICD-10-CM | POA: Insufficient documentation

## 2019-04-06 DIAGNOSIS — R600 Localized edema: Secondary | ICD-10-CM | POA: Diagnosis not present

## 2019-04-06 MED ORDER — PREDNISONE 10 MG PO TABS: ORAL_TABLET | ORAL | 0 refills | Status: DC

## 2019-04-06 NOTE — Patient Instructions (Signed)
You had recent calf region discomfort which is a little bit atypical.  It does not match your prior restless leg syndrome type pain per your report nor does seem directly related to your historical MRI findings of the lumbar spine.  I will do think this will be evaluated to treat you for muscle inflammation with low-dose/short brief course of tapered prednisone.  I also think is a good idea to go ahead and get bilateral lower extremity ultrasound sounds since you report a little bit of the pain in the popliteal regions.  We will see how you respond to treatment with the prednisone.  If ultrasound is negative and calf discomfort persist then will want to get sed rate and other inflammatory labs sometime next week.  Follow-up date to be determined after ultrasound review.

## 2019-04-06 NOTE — Progress Notes (Signed)
Subjective:    Patient ID: Brandon Mason, male    DOB: 1926/01/03, 85 y.o.   MRN: [id number]  HPI  Virtual Visit via Telephone Note  I connected with Brandon Mason on 04/06/19 at  9:00 AM EDT by telephone and verified that I am speaking with the correct person using two identifiers.  Location: Patient: home Provider: office   I discussed the limitations, risks, security and privacy concerns of performing an evaluation and management service by telephone and the availability of in person appointments. I also discussed with the patient that there may be a patient responsible charge related to this service. The patient expressed understanding and agreed to proceed.  Pt did not check his vital sign today.  History of Present Illness:   Pt states from both knee down his legs has uncomfortable feeling. Pt has known restless leg syndrome. He states legs feel like they are about to go to sleep. Pt states that his symptoms feel not like his restless legs. He states pain feels like sunburn. No rash on his legs. Pt states his calf feely symmetrically week. No cramping of calf muscles. Pt describes mild swelling of ankles but not in his calfs. Calf weakness makes him feel off balance but no dizziness.  Pt clarifies that he has some low back pain with sciatica. He got epidural injection in summer almost stopped pain completely.  Pt restless leg and neuropathic pain he is on tramadol, requip and gabapentin.     Observations/Objective: General- no acute distress, pleasant, oriented, normal speech.   Assessment and Plan: You had recent calf region discomfort which is a little bit atypical.  It does not match your prior restless leg syndrome type pain per your report nor does seem directly related to your historical MRI findings of the lumbar spine.  I will do think this will be evaluated to treat you for muscle inflammation with low-dose/short brief course of tapered prednisone.  I also think  is a good idea to go ahead and get bilateral lower extremity ultrasound sounds since you report a little bit of the pain in the popliteal regions.  We will see how you respond to treatment with the prednisone.  If ultrasound is negative and calf discomfort persist then will want to get sed rate and other inflammatory labs sometime next week.  Follow-up date to be determined after ultrasound review.  Follow Up Instructions:    I discussed the assessment and treatment plan with the patient. The patient was provided an opportunity to ask questions and all were answered. The patient agreed with the plan and demonstrated an understanding of the instructions.   The patient was advised to call back or seek an in-person evaluation if the symptoms worsen or if the condition fails to improve as anticipated.  I provided 25 minutes of non-face-to-face time during this encounter.   Mackie Pai, [name]   Review of Systems  Constitutional: Negative for chills, fatigue and fever.  Respiratory: Negative for cough, choking, shortness of breath and wheezing.   Cardiovascular: Negative for chest pain and palpitations.  Musculoskeletal:       See hpi.  Skin: Negative for rash.  Neurological: Negative for dizziness, seizures, weakness and headaches.  Hematological: Negative for adenopathy. Does not bruise/bleed easily.  Psychiatric/Behavioral: Negative for behavioral problems and decreased concentration.    Past Medical History:  Diagnosis Date  . Abnormality of gait 02/09/2013  . Arthritis   . Cancer ([place])    basal cell  skin cancer  . Cardiomegaly   . Chronic kidney disease    BPH  . Chronic leg pain   . Hypersomnia   . Hypertension   . Hypoxia    pulmonary  . Neuromuscular disorder (HCC)    restless leg syndrome   . Nocturia   . Prostate disorder   . Restless legs syndrome (RLS) 02/09/2013  . Restless legs syndrome with nocturnal myoclonus   . RLS (restless legs syndrome)    x 40 years   . Shortness of breath    with exertion   . Sleep apnea    uses cpap  . Stroke [facility])    minor stroke in 2003 - no lasting side effects  . UARS (upper airway resistance syndrome) 06/28/2014     Social History   Socioeconomic History  . Marital status: Married    Spouse name: Brandon Mason  . Number of children: 5  . Years of education: 38  . Highest education level: Not on file  Occupational History  . 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 Financial resource strain: Not on file  . Food insecurity    Worry: Not on file    Inability: Not on file  . Transportation needs    Medical: Not on file    Non-medical: Not on file  Tobacco Use  . Smoking status: Never Smoker  . Smokeless tobacco: Never Used  Substance and Sexual Activity  . Alcohol use: Yes    Alcohol/week: 2.0 standard drinks    Types: 2 Cans of beer per week    Comment: weekly  . Drug use: No  . Sexual activity: Not on file  Lifestyle  . Physical activity    Days per week: Not on file    Minutes per session: Not on file  . Stress: Not on file  Relationships  . Social Herbalist on phone: Not on file    Gets together: Not on file    Attends religious service: Not on file    Active member of club or organization: Not on file    Attends meetings of clubs or organizations: Not on file    Relationship status: Not on file  . Intimate partner violence    Fear of current or ex partner: Not on file    Emotionally abused: Not on file    Physically abused: Not on file    Forced sexual activity: Not on file  Other Topics Concern  . Not on file  Social History Narrative   Patient lives at home with his wife Brandon Mason.   Patient is retired.    Patient has 5 children.   Patient has a high school education.   Patient is right-handed.   Patient drinks very little caffeine.    Past Surgical History:  Procedure Laterality Date  . ANTERIOR CERVICAL DECOMP/DISCECTOMY FUSION  N/A 03/29/2016   Procedure: ANTERIOR CERVICAL DECOMPRESSION/DISCECTOMY FUSION CERVICAL THREE- CERVICAL FOUR;  Surgeon: Ashok Pall, MD;  Location: [place] [facility];  Service: Neurosurgery;  Laterality: N/A;  . BACK SURGERY  2003   L4-5  . CATARACT EXTRACTION Bilateral   . HERNIA REPAIR     inguinal hernia  . TRANSURETHRAL PROSTATECTOMY WITH GYRUS INSTRUMENTS  04/28/2012   Procedure: TRANSURETHRAL PROSTATECTOMY WITH GYRUS INSTRUMENTS;  Surgeon: Fredricka Bonine, MD;  Location: [facility];  Service: Urology;  Laterality: N/A;  . TRANSURETHRAL RESECTION OF PROSTATE  03/24/2012   Procedure:  TRANSURETHRAL RESECTION OF THE PROSTATE (TURP);  Surgeon: Fredricka Bonine, MD;  Location: [facility];  Service: Urology;  Laterality: N/A;  with gyrus ,Greenlight PVP laser of Prostate    Family History  Problem Relation Age of Onset  . Restless legs syndrome Mother   . Restless legs syndrome Daughter   . Esophageal cancer Neg Hx   . Colon cancer Neg Hx   . Pancreatic cancer Neg Hx   . Stomach cancer Neg Hx     No Known Allergies  Current Outpatient Medications on File Prior to Visit  Medication Sig Dispense Refill  . amLODipine (NORVASC) 10 MG tablet Take 1 tablet by mouth once daily 90 tablet 0  . ciprofloxacin (CIPRO) 500 MG tablet Take 1 tablet (500 mg total) by mouth 2 (two) times daily. 14 tablet 0  . gabapentin (NEURONTIN) 600 MG tablet TAKE 1 TABLET BY MOUTH THREE TIMES DAILY 270 tablet 0  . hydrochlorothiazide (HYDRODIURIL) 25 MG tablet Take 1 tablet by mouth once daily 90 tablet 0  . lisinopril (ZESTRIL) 20 MG tablet Take 1 tablet by mouth once daily 90 tablet 0  . meloxicam (MOBIC) 7.5 MG tablet TAKE 1 TO 2 TABLETS BY MOUTH ONCE DAILY 16 tablet 0  . rOPINIRole (REQUIP) 4 MG tablet TAKE 1 TABLET BY MOUTH AT BEDTIME 30 tablet 5  . traMADol (ULTRAM) 50 MG tablet TAKE 1 TABLET (50 MG TOTAL) BY MOUTH EVERY 8 (EIGHT) HOURS AS NEEDED. 90 tablet 0  . traMADol (ULTRAM) 50 MG tablet TAKE 1 TABLET  BY MOUTH EVERY 8 HOURS AS NEEDED FOR PAIN 90 tablet 0   No current facility-administered medications on file prior to visit.     There were no vitals taken for this visit.      Objective:   Physical Exam        Assessment & Plan:

## 2019-04-06 NOTE — Telephone Encounter (Signed)
Patient called in to check status of results . He stated nothing ahs been uploaded to my chart . Please advise

## 2019-04-07 NOTE — Telephone Encounter (Signed)
Pt viewed results on My Chart 

## 2019-04-14 ENCOUNTER — Telehealth: Payer: Self-pay

## 2019-04-14 NOTE — Telephone Encounter (Signed)
Copied from [place] [phone]. Topic: General - Other >> Apr 13, 2019  4:07 PM Rainey Pines A wrote: Patient called to inform provider that he has finished taking medication prescribed and his condition has not improved.

## 2019-04-15 NOTE — Telephone Encounter (Signed)
Will you call pt and get him scheduled for virtual visit or in office visit. Maybe office visit since might get some labs before the weekend.   Want to discuss leg pain.

## 2019-04-16 ENCOUNTER — Telehealth: Payer: Self-pay | Admitting: Medical

## 2019-04-16 ENCOUNTER — Ambulatory Visit (INDEPENDENT_AMBULATORY_CARE_PROVIDER_SITE_OTHER): Payer: Medicare Other | Admitting: Medical

## 2019-04-16 ENCOUNTER — Encounter: Payer: Self-pay | Admitting: Medical

## 2019-04-16 ENCOUNTER — Other Ambulatory Visit: Payer: Self-pay

## 2019-04-16 VITALS — BP 89/60 | HR 92 | Temp 98.3°F | Resp 16 | Ht 68.0 in | Wt 180.6 lb

## 2019-04-16 DIAGNOSIS — M791 Myalgia, unspecified site: Secondary | ICD-10-CM | POA: Diagnosis not present

## 2019-04-16 DIAGNOSIS — R252 Cramp and spasm: Secondary | ICD-10-CM

## 2019-04-16 DIAGNOSIS — I9589 Other hypotension: Secondary | ICD-10-CM | POA: Diagnosis not present

## 2019-04-16 LAB — CBC WITH DIFFERENTIAL/PLATELET
Basophils Absolute: 0 10*3/uL (ref 0.0–0.1)
Basophils Relative: 0.6 % (ref 0.0–3.0)
Eosinophils Absolute: 0.2 10*3/uL (ref 0.0–0.7)
Eosinophils Relative: 2.8 % (ref 0.0–5.0)
HCT: 35.7 % — ABNORMAL LOW (ref 39.0–52.0)
Hemoglobin: 12.1 g/dL — ABNORMAL LOW (ref 13.0–17.0)
Lymphocytes Relative: 19 % (ref 12.0–46.0)
Lymphs Abs: 1.3 10*3/uL (ref 0.7–4.0)
MCHC: 34 g/dL (ref 30.0–36.0)
MCV: 100.6 fl — ABNORMAL HIGH (ref 78.0–100.0)
Monocytes Absolute: 0.8 10*3/uL (ref 0.1–1.0)
Monocytes Relative: 11.7 % (ref 3.0–12.0)
Neutro Abs: 4.5 10*3/uL (ref 1.4–7.7)
Neutrophils Relative %: 65.9 % (ref 43.0–77.0)
Platelets: 249 10*3/uL (ref 150.0–400.0)
RBC: 3.54 Mil/uL — ABNORMAL LOW (ref 4.22–5.81)
RDW: 13.5 % (ref 11.5–15.5)
WBC: 6.8 10*3/uL (ref 4.0–10.5)

## 2019-04-16 LAB — COMPREHENSIVE METABOLIC PANEL
ALT: 10 U/L (ref 0–53)
AST: 11 U/L (ref 0–37)
Albumin: 3.7 g/dL (ref 3.5–5.2)
Alkaline Phosphatase: 60 U/L (ref 39–117)
BUN: 31 mg/dL — ABNORMAL HIGH (ref 6–23)
CO2: 28 mEq/L (ref 19–32)
Calcium: 8.5 mg/dL (ref 8.4–10.5)
Chloride: 100 mEq/L (ref 96–112)
Creatinine, Ser: 1.12 mg/dL (ref 0.40–1.50)
GFR: 61.47 mL/min (ref >60.00)
Glucose, Bld: 115 mg/dL — ABNORMAL HIGH (ref 70–99)
Potassium: 4 mEq/L (ref 3.5–5.1)
Sodium: 135 mEq/L (ref 135–145)
Total Bilirubin: 0.4 mg/dL (ref 0.2–1.2)
Total Protein: 6.3 g/dL (ref 6.0–8.3)

## 2019-04-16 LAB — MAGNESIUM: Magnesium: 2 mg/dL (ref 1.5–2.5)

## 2019-04-16 LAB — SEDIMENTATION RATE: Sed Rate: 12 mm/hr (ref 0–20)

## 2019-04-16 LAB — CK: Total CK: 34 U/L (ref 7–232)

## 2019-04-16 NOTE — Patient Instructions (Addendum)
Your current bilateral lower extremity discomfort does not appear to be associated with your prior lower back conditions.  Also recently proven that you do not have acute DVT.  You do have some bilateral Baker's cyst.  These are incidental findings and not causing your pain.  You report that you are dealing with this pain well.  I want you to continue with your tramadol, Requip and gabapentin.  Difficult to find additional add on medication at this point.  We will go ahead and place order for sed rate, CPK and ABI.  In addition we will get metabolic panel and magnesium level.  You do have low blood pressure today and will get stat metabolic panel and CBC.  Will assess labs to see if you have dehydration markers and also make sure you are not acutely anemic.  He has not taken your blood pressure medication amlodipine or lisinopril today.  I recommend holding off on this until we get the stat labs back and review those with you.  Also will ask you to check your blood pressure tomorrow to see if blood pressure is better.  Be careful on standing up to ambulate.  Recommend you get your balance in the event that you get lightheaded.  Follow-up date to be determined after lab review.

## 2019-04-16 NOTE — Telephone Encounter (Signed)
Pt scheduled for today.  

## 2019-04-16 NOTE — Telephone Encounter (Signed)
Can you get pt scheduled for abi. I think you have to call vascular [facility]nic. They set up but can be done at [facility].

## 2019-04-16 NOTE — Progress Notes (Signed)
Subjective:    Patient ID: Brandon Mason, male    DOB: Nov 27, 1925, 85 y.o.   MRN: [id number]  HPI  Pt has had knee down his legs has uncomfortable feeling. Pt has known restless leg syndrome. He states legs feel like they are about to go to sleep. Pt states that his symptoms don't  feel like his restless legs. He states pain feels like sunburn. No rash on his legs. Pt states his calf feely symmetrically week. Some recent cramping of calf muscles. Pt describes mild swelling of ankles but not in his calfs. Calf weakness makes him feel off balance but no dizziness.  Pt clarifies that he has some low back pain with sciatica. He got epidural injection in summer almost stopped pain completely.  Pt restless leg and neuropathic pain he is on tramadol, requip and gabapentin.   Pt has low blood pressure today intially. No black appearance to stools. Pt wait is stable.  Pt lower ext still feel sore in calfs, cramping intermittent, sun burnt feel, and  decreased sensation.   No DVT seen recently but some small baker cyst seen.  This pain that he is having is not severe. Just irritating.  Pain at most 5/10.     Review of Systems  Constitutional: Negative for chills, fatigue and fever.  Respiratory: Negative for cough, chest tightness, shortness of breath and wheezing.   Cardiovascular: Negative for chest pain and palpitations.  Gastrointestinal: Negative for abdominal pain.  Genitourinary: Negative for dysuria and hematuria.  Musculoskeletal: Negative for back pain, joint swelling, neck pain and neck stiffness.       Lower ext cramping at times. Mild diffuse leg calf pain.  Skin: Negative for rash.  Neurological: Negative for dizziness, weakness and headaches.  Hematological: Negative for adenopathy. Does not bruise/bleed easily.  Psychiatric/Behavioral: Negative for behavioral problems and confusion.    Past Medical History:  Diagnosis Date  . Abnormality of gait 02/09/2013  .  Arthritis   . Cancer ([place])    basal cell skin cancer  . Cardiomegaly   . Chronic kidney disease    BPH  . Chronic leg pain   . Hypersomnia   . Hypertension   . Hypoxia    pulmonary  . Neuromuscular disorder ([facility])    restless leg syndrome   . Nocturia   . Prostate disorder   . Restless legs syndrome (RLS) 02/09/2013  . Restless legs syndrome with nocturnal myoclonus   . RLS (restless legs syndrome)    x 40 years  . Shortness of breath    with exertion   . Sleep apnea    uses cpap  . Stroke [facility])    minor stroke in 2003 - no lasting side effects  . UARS (upper airway resistance syndrome) 06/28/2014     Social History   Socioeconomic History  . Marital status: Married    Spouse name: [name]  . Number of children: 5  . Years of education: 1  . Highest education level: Not on file  Occupational History  . 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 Financial resource strain: Not on file  . Food insecurity    Worry: Not on file    Inability: Not on file  . Transportation needs    Medical: Not on file    Non-medical: Not on file  Tobacco Use  . Smoking status: Never Smoker  . Smokeless tobacco: Never Used  Substance and  Sexual Activity  . Alcohol use: Yes    Alcohol/week: 2.0 standard drinks    Types: 2 Cans of beer per week    Comment: weekly  . Drug use: No  . Sexual activity: Not on file  Lifestyle  . Physical activity    Days per week: Not on file    Minutes per session: Not on file  . Stress: Not on file  Relationships  . Social Herbalist on phone: Not on file    Gets together: Not on file    Attends religious service: Not on file    Active member of club or organization: Not on file    Attends meetings of clubs or organizations: Not on file    Relationship status: Not on file  . Intimate partner violence    Fear of current or ex partner: Not on file    Emotionally abused: Not on file    Physically  abused: Not on file    Forced sexual activity: Not on file  Other Topics Concern  . Not on file  Social History Narrative   Patient lives at home with his wife [name].   Patient is retired.    Patient has 5 children.   Patient has a high school education.   Patient is right-handed.   Patient drinks very little caffeine.    Past Surgical History:  Procedure Laterality Date  . ANTERIOR CERVICAL DECOMP/DISCECTOMY FUSION N/A 03/29/2016   Procedure: ANTERIOR CERVICAL DECOMPRESSION/DISCECTOMY FUSION CERVICAL THREE- CERVICAL FOUR;  Surgeon: Ashok Pall, MD;  Location: [place] [facility];  Service: Neurosurgery;  Laterality: N/A;  . BACK SURGERY  2003   L4-5  . CATARACT EXTRACTION Bilateral   . HERNIA REPAIR     inguinal hernia  . TRANSURETHRAL PROSTATECTOMY WITH GYRUS INSTRUMENTS  04/28/2012   Procedure: TRANSURETHRAL PROSTATECTOMY WITH GYRUS INSTRUMENTS;  Surgeon: Fredricka Bonine, MD;  Location: [facility];  Service: Urology;  Laterality: N/A;  . TRANSURETHRAL RESECTION OF PROSTATE  03/24/2012   Procedure: TRANSURETHRAL RESECTION OF THE PROSTATE (TURP);  Surgeon: Fredricka Bonine, MD;  Location: [facility];  Service: Urology;  Laterality: N/A;  with gyrus ,Greenlight PVP laser of Prostate    Family History  Problem Relation Age of Onset  . Restless legs syndrome Mother   . Restless legs syndrome Daughter   . Esophageal cancer Neg Hx   . Colon cancer Neg Hx   . Pancreatic cancer Neg Hx   . Stomach cancer Neg Hx     No Known Allergies  Current Outpatient Medications on File Prior to Visit  Medication Sig Dispense Refill  . amLODipine (NORVASC) 10 MG tablet Take 1 tablet by mouth once daily 90 tablet 0  . ciprofloxacin (CIPRO) 500 MG tablet Take 1 tablet (500 mg total) by mouth 2 (two) times daily. 14 tablet 0  . gabapentin (NEURONTIN) 600 MG tablet TAKE 1 TABLET BY MOUTH THREE TIMES DAILY 270 tablet 0  . hydrochlorothiazide (HYDRODIURIL) 25 MG tablet Take 1 tablet by mouth once  daily 90 tablet 0  . lisinopril (ZESTRIL) 20 MG tablet Take 1 tablet by mouth once daily 90 tablet 0  . meloxicam (MOBIC) 7.5 MG tablet TAKE 1 TO 2 TABLETS BY MOUTH ONCE DAILY 16 tablet 0  . traMADol (ULTRAM) 50 MG tablet TAKE 1 TABLET BY MOUTH EVERY 8 HOURS AS NEEDED FOR PAIN 90 tablet 0   No current facility-administered medications on file prior to visit.     BP Marland Kitchen)  89/54   Pulse 92   Temp 98.3 F (36.8 C) (Temporal)   Resp 16   Ht 5\' 8"  (1.727 m)   Wt 180 lb 9.6 oz (81.9 kg)   SpO2 94%   BMI 27.46 kg/m       Objective:   Physical Exam  General Mental Status- Alert. General Appearance- Not in acute distress.   Skin General: Color- Normal Color. Moisture- Normal Moisture.  Neck Carotid Arteries- Normal color. Moisture- Normal Moisture. No carotid bruits. No JVD.  Chest and Lung Exam Auscultation: Breath Sounds:-Normal.  Cardiovascular Auscultation:Rythm- Regular. Murmurs & Other Heart Sounds:Auscultation of the heart reveals- No Murmurs.  Abdomen Inspection:-Inspeection Normal. Palpation/Percussion:Note:No mass. Palpation and Percussion of the abdomen reveal- Non Tender, Non Distended + BS, no rebound or guarding.   Neurologic Cranial Nerve exam:- CN III-XII intact(No nystagmus), symmetric smile. Strength:- 5/5 equal and symmetric strength both upper and lower extremities.      Assessment & Plan:  Your current bilateral lower extremity discomfort does not appear to be associated with your prior lower back conditions.  Also recently proven that you do not have acute DVT.  You do have some bilateral Baker's cyst.  These are incidental findings and not causing your pain.  You report that you are dealing with this pain well.  I want you to continue with your tramadol, Requip and gabapentin.  Difficult to find additional add on medication at this point.  We will go ahead and place order for sed rate, CPK and ABI.  In addition we will get metabolic panel and magnesium  level.  You do have low blood pressure today and will get stat metabolic panel and CBC.  Will assess labs to see if you have dehydration markers and also make sure you are not acutely anemic.  He has not taken your blood pressure medication amlodipine or lisinopril today.  I recommend holding off on this until we get the stat labs back and review those with you.  Also will ask you to check your blood pressure tomorrow to see if blood pressure is better.  Be careful on standing up to ambulate.  Recommend you get your balance in the event that you get lightheaded.  Follow-up date to be determined after lab review.   25 minutes spent with pt. 50% of time spent with patient counseling on plan going forward. Mackie Pai, [name]

## 2019-04-19 ENCOUNTER — Telehealth: Payer: Self-pay | Admitting: *Deleted

## 2019-04-19 NOTE — Telephone Encounter (Signed)
Brandon Mason called patient about appointment for abi and patient said that he already had it done and he has not heard the results.  I looked it up and the test that he had done was the doppler which he did say he read the results on mychart.  I asked was he still having the pain and patient responded "dont you have my chart right there?"  I asked the question again are you still having pain and the patient said yes.  While on the phone with him I noticed that Brandon Mason wanted an ABI.  Patient had not received his lab results yet which had that information in it and we had not called or he had not viewed them yet on mychart.  He would like to cancel test.  Brandon Mason would you be able to talk with patient about this since he is still having pain.  I think he just needs to know the difference of the tests?

## 2019-04-20 NOTE — Telephone Encounter (Signed)
Ok. Noted. You can pass message along to cancel ABI.

## 2019-04-20 NOTE — Telephone Encounter (Signed)
Pt states there is no misunderstanding he is just not going to do it. PT state he does "not want a virtual visit there is nothing Brandon Mason can do about it she no point in wasting his time".

## 2019-04-20 NOTE — Telephone Encounter (Signed)
Called [facility] lab at [phone] left a message to cancel pt's appointment.

## 2019-04-20 NOTE — Telephone Encounter (Signed)
Pt has some questions about abi study. This differs from ultrasound. The ultrasound evaluated for deep vein thrombosis. The ABI is study that evaluate for peripheral artery disease. He is scheduled for Friday. He wants to cancel appointment. Will you explain the above but also schedule telephone virtual visit as pretty busy today and if I call and explain want call as visit. If you could get  Him scheduled today or tomorrow morning.

## 2019-04-21 NOTE — Telephone Encounter (Signed)
Pt is calling to apologize for being rude yesterday and would like to have ABI set up

## 2019-04-21 NOTE — Telephone Encounter (Signed)
appointment was scheduled again.

## 2019-04-21 NOTE — Telephone Encounter (Signed)
Pt called stating he does have the imaging scheduled and he is sorry about how he acted yesterday. Please advise.

## 2019-04-21 NOTE — Telephone Encounter (Signed)
Ok. Want him to get done. Don't cancel. Can you double check with Gwen that pt aware of appointment date and time.

## 2019-04-23 ENCOUNTER — Other Ambulatory Visit: Payer: Self-pay

## 2019-04-23 ENCOUNTER — Encounter (HOSPITAL_BASED_OUTPATIENT_CLINIC_OR_DEPARTMENT_OTHER): Payer: Medicare Other

## 2019-04-23 ENCOUNTER — Ambulatory Visit (HOSPITAL_BASED_OUTPATIENT_CLINIC_OR_DEPARTMENT_OTHER)
Admission: RE | Admit: 2019-04-23 | Discharge: 2019-04-23 | Disposition: A | Discharge status: Home / Self Care | Payer: Medicare Other | Source: Ambulatory Visit | Attending: Medical | Admitting: Medical

## 2019-04-23 DIAGNOSIS — Z23 Encounter for immunization: Secondary | ICD-10-CM | POA: Diagnosis not present

## 2019-04-23 DIAGNOSIS — R252 Cramp and spasm: Secondary | ICD-10-CM | POA: Insufficient documentation

## 2019-04-23 NOTE — Progress Notes (Signed)
ABI Doppler performed   04/23/19 Brandon Mason

## 2019-05-10 ENCOUNTER — Other Ambulatory Visit: Payer: Self-pay | Admitting: Medical

## 2019-05-11 NOTE — Telephone Encounter (Addendum)
Refill Request: Tramadol  Last RX:04/05/19 Last OV:04/16/19 Next [id number] scheduled  UDS:10/17/17 CSC:10/22/17 CSR:05/13/2019  Rx refill sent to pt pharmacy.

## 2019-05-26 ENCOUNTER — Other Ambulatory Visit: Payer: Self-pay

## 2019-06-03 ENCOUNTER — Telehealth: Payer: Self-pay | Admitting: Medical

## 2019-06-03 ENCOUNTER — Telehealth: Payer: Self-pay | Admitting: *Deleted

## 2019-06-03 DIAGNOSIS — G629 Polyneuropathy, unspecified: Secondary | ICD-10-CM

## 2019-06-03 DIAGNOSIS — G2581 Restless legs syndrome: Secondary | ICD-10-CM

## 2019-06-03 NOTE — Telephone Encounter (Signed)
Copied from [place] [phone]. Topic: General - Other >> Jun 03, 2019 12:04 PM Leward Quan A wrote: Reason for CRM: Patient called to inquire from Mackie Pai want to know why he cant get a referral to a Neurologist asking for a call back with some answers at Ph# [phone]

## 2019-06-03 NOTE — Telephone Encounter (Signed)
Referral to neurologist placed. 

## 2019-06-03 NOTE — Telephone Encounter (Signed)
I put in referral just now. I looked at my last note and don't see that referral was part of the plan. Though I do have recollection we discussed that as option. As I recall he had seen one before but I was under the impression he was discouraged about that route since he had been to one before but no solution found.  He saw Dr. Duke Salvia with [facility] in the past. That was in 2017.   Would you verify he wants to see someone different. Let me know. Sometimes if he is already established pt he may be seen quicker.  Will you call pt and advise.

## 2019-06-07 NOTE — Telephone Encounter (Signed)
Left message for pt to return my call.

## 2019-06-09 NOTE — Telephone Encounter (Signed)
See note on referral. He wanted to be referred to someone in [place].

## 2019-06-13 ENCOUNTER — Other Ambulatory Visit: Payer: Self-pay | Admitting: Medical

## 2019-06-14 NOTE — Telephone Encounter (Addendum)
Tramadol  Last written: Last ov: 04/16/19 Next ov: nothing scheduled Contract: due UDS: due  Do you want to refill the nystatin powder?   Reviewed controlled medication web site. Refilled tramadol. Rx of nystatin cream. If he is doing well with that then won't rx powder

## 2019-06-20 ENCOUNTER — Other Ambulatory Visit: Payer: Self-pay | Admitting: Medical

## 2019-07-02 ENCOUNTER — Other Ambulatory Visit: Payer: Self-pay | Admitting: Medical

## 2019-07-13 DIAGNOSIS — Z7409 Other reduced mobility: Secondary | ICD-10-CM | POA: Diagnosis not present

## 2019-07-13 DIAGNOSIS — M5136 Other intervertebral disc degeneration, lumbar region: Secondary | ICD-10-CM | POA: Diagnosis not present

## 2019-07-13 DIAGNOSIS — M5417 Radiculopathy, lumbosacral region: Secondary | ICD-10-CM | POA: Diagnosis not present

## 2019-07-16 ENCOUNTER — Other Ambulatory Visit: Payer: Self-pay | Admitting: Medical

## 2019-07-16 NOTE — Telephone Encounter (Addendum)
Requesting:Tramadol Contract: 10/22/17 UDS:10/22/17 Last OV:04/16/19 Next OV:n/a Last Refill:06/15/19 #90tab Database: 07/16/2018.   Please advise   I gave one week supply of tramadol. Please get pt scheduled to come in for uds and update controlled med contract.

## 2019-07-20 DIAGNOSIS — M5417 Radiculopathy, lumbosacral region: Secondary | ICD-10-CM | POA: Diagnosis not present

## 2019-07-20 DIAGNOSIS — M5136 Other intervertebral disc degeneration, lumbar region: Secondary | ICD-10-CM | POA: Diagnosis not present

## 2019-07-20 DIAGNOSIS — Z7409 Other reduced mobility: Secondary | ICD-10-CM | POA: Diagnosis not present

## 2019-07-23 ENCOUNTER — Other Ambulatory Visit: Payer: Self-pay | Admitting: Medical

## 2019-07-23 DIAGNOSIS — M5136 Other intervertebral disc degeneration, lumbar region: Secondary | ICD-10-CM | POA: Diagnosis not present

## 2019-07-23 DIAGNOSIS — M5417 Radiculopathy, lumbosacral region: Secondary | ICD-10-CM | POA: Diagnosis not present

## 2019-07-23 DIAGNOSIS — Z7409 Other reduced mobility: Secondary | ICD-10-CM | POA: Diagnosis not present

## 2019-07-23 NOTE — Telephone Encounter (Addendum)
Requesting:Tramadol Contract:10/22/17 UDS:10/22/17 Last OV:04/16/19 Next OV:n/a Last Refill:07/17/19 #21tab 0rf Database: 07/24/2019   Please advise   Looks like I only gave him limited one week rx last time since his contract and uds is not up to date. So not filling. Please get him in for update on those. Then will send in the rx.

## 2019-07-26 DIAGNOSIS — M5136 Other intervertebral disc degeneration, lumbar region: Secondary | ICD-10-CM | POA: Diagnosis not present

## 2019-07-26 DIAGNOSIS — M5417 Radiculopathy, lumbosacral region: Secondary | ICD-10-CM | POA: Diagnosis not present

## 2019-07-26 DIAGNOSIS — Z7409 Other reduced mobility: Secondary | ICD-10-CM | POA: Diagnosis not present

## 2019-07-27 ENCOUNTER — Other Ambulatory Visit: Payer: Medicare Other

## 2019-07-27 ENCOUNTER — Other Ambulatory Visit: Payer: Self-pay | Admitting: Medical

## 2019-07-27 ENCOUNTER — Other Ambulatory Visit: Payer: Self-pay

## 2019-07-27 ENCOUNTER — Telehealth: Payer: Self-pay | Admitting: Medical

## 2019-07-27 ENCOUNTER — Ambulatory Visit (HOSPITAL_BASED_OUTPATIENT_CLINIC_OR_DEPARTMENT_OTHER)
Admission: RE | Admit: 2019-07-27 | Discharge: 2019-07-27 | Disposition: A | Discharge status: Home / Self Care | Payer: Medicare Other | Source: Ambulatory Visit | Attending: Medical | Admitting: Medical

## 2019-07-27 DIAGNOSIS — R600 Localized edema: Secondary | ICD-10-CM | POA: Insufficient documentation

## 2019-07-27 DIAGNOSIS — J811 Chronic pulmonary edema: Secondary | ICD-10-CM | POA: Diagnosis not present

## 2019-07-27 DIAGNOSIS — Z79899 Other long term (current) drug therapy: Secondary | ICD-10-CM | POA: Diagnosis not present

## 2019-07-27 MED ORDER — TRAMADOL HCL 50 MG PO TABS: ORAL_TABLET | ORAL | 0 refills | Status: DC

## 2019-07-27 NOTE — Telephone Encounter (Signed)
rx tramadol sent to pt pharmacy. uds updated and contract updated.

## 2019-07-27 NOTE — Telephone Encounter (Signed)
traMADol (ULTRAM) 50 MG tablet  Patient needs refill asap, patient states that he called Pharmacy.  Patient is completely out.  Pharmacy ; [facility]; [place]

## 2019-07-27 NOTE — Telephone Encounter (Signed)
Requesting:Tramadol  Contract:07/27/19 (patient came in office and signed ) UDS:07/27/19(patient came in office) Last OV:04/16/19 Next OV:n\a Last Refill:07/17/19 #21 tab 0rf Database:   Please advise: Patient in office and stated he will not leave until this medication is filled. Advised patient that provider was with patients at the moment and has to wait until provider sees patients.

## 2019-07-27 NOTE — Telephone Encounter (Signed)
Rx tramadol sent to pt pharmacy. 

## 2019-07-28 ENCOUNTER — Encounter: Payer: Self-pay | Admitting: Medical

## 2019-07-28 ENCOUNTER — Ambulatory Visit (INDEPENDENT_AMBULATORY_CARE_PROVIDER_SITE_OTHER): Payer: Medicare Other | Admitting: Medical

## 2019-07-28 VITALS — BP 130/90 | HR 76 | Ht 68.0 in | Wt 194.0 lb

## 2019-07-28 DIAGNOSIS — R0600 Dyspnea, unspecified: Secondary | ICD-10-CM

## 2019-07-28 DIAGNOSIS — R600 Localized edema: Secondary | ICD-10-CM | POA: Diagnosis not present

## 2019-07-28 NOTE — Progress Notes (Signed)
   Subjective:    Patient ID: Brandon Mason, male    DOB: Feb 20, 1926, 86 y.o.   MRN: [id number]  HPI  Virtual Visit via Telephone Note  I connected with Brandon Mason on 07/28/19 at 10:40 AM EST by telephone and verified that I am speaking with the correct person using two identifiers.  Location: Patient: home Provider: office   I discussed the limitations, risks, security and privacy concerns of performing an evaluation and management service by telephone and the availability of in person appointments. I also discussed with the patient that there may be a patient responsible charge related to this service. The patient expressed understanding and agreed to proceed.   History of Present Illness: Pt has history of ankle for months chronically. Pt states recent swelling on both extremities up to knee or above.Pt states he has gained about 10 lb in about 2 months. He state color of his lower extremity is normal.  Pt has states he has some shortness of breath for months. Not new occurrence. He states this is with activity and not at rest.    Observations/Objective:   General- no acute distress, pleasant, alert and oriented.  Assessment and Plan: For bilateral described moderate to severe pedal edema will get bilateral lower ext [place].  Patient expresses that he does not want to get the ultrasound done today but is willing to come in tomorrow.  So arranged with radiology for patient to have the test done tomorrow at 1:30 PM.  Medical assistant notified patient of appointment time.  Also patient will have metabolic panel tomorrow and a BNP.  Chest x-ray was done yesterday and she came in for a refill of medication and wanted opinion regarding his lower extremity edema.  Scheduled for at that time but decided to go ahead and get a chest x-ray to evaluate if any CHF type findings.  Chest x-ray was normal in that regards.  Pending test being done if he has any change in his baseline chronic  mild shortness of breath and advised to be evaluated the emergency department.  If ultrasound and labs are will likely give low dose short-term prescription of diuretic, advised compression socks and elevation of legs.  Follow-up date to be determined pending x-ray and lab review  Follow Up Instructions:    I discussed the assessment and treatment plan with the patient. The patient was provided an opportunity to ask questions and all were answered. The patient agreed with the plan and demonstrated an understanding of the instructions.   The patient was advised to call back or seek an in-person evaluation if the symptoms worsen or if the condition fails to improve as anticipated.  I provided  20 minutes of non-face-to-face time during this encounter.   Mackie Pai, [name]    Review of Systems     Objective:   Physical Exam        Assessment & Plan:

## 2019-07-28 NOTE — Patient Instructions (Signed)
For bilateral described moderate to severe pedal edema will get bilateral lower ext [place].  Patient expresses that he does not want to get the ultrasound done today but is willing to come in tomorrow.  So arranged with radiology for patient to have the test done tomorrow at 1:30 PM.  Medical assistant notified patient of appointment time.  Also patient will have metabolic panel tomorrow and a BNP.  Chest x-ray was done yesterday and she came in for a refill of medication and wanted opinion regarding his lower extremity edema.  Scheduled for at that time but decided to go ahead and get a chest x-ray to evaluate if any CHF type findings.  Chest x-ray was normal in that regards.  Pending test being done if he has any change in his baseline chronic mild shortness of breath and advised to be evaluated the emergency department.  If ultrasound and labs are will likely give low dose short-term prescription of diuretic, advised compression socks and elevation of legs.  Follow-up date to be determined pending x-ray and lab review

## 2019-07-29 ENCOUNTER — Other Ambulatory Visit: Payer: Self-pay

## 2019-07-29 ENCOUNTER — Ambulatory Visit (HOSPITAL_BASED_OUTPATIENT_CLINIC_OR_DEPARTMENT_OTHER)
Admission: RE | Admit: 2019-07-29 | Discharge: 2019-07-29 | Disposition: A | Discharge status: Home / Self Care | Payer: Medicare Other | Source: Ambulatory Visit | Attending: Medical | Admitting: Medical

## 2019-07-29 ENCOUNTER — Other Ambulatory Visit (INDEPENDENT_AMBULATORY_CARE_PROVIDER_SITE_OTHER): Payer: Medicare Other

## 2019-07-29 ENCOUNTER — Telehealth: Payer: Self-pay | Admitting: Medical

## 2019-07-29 DIAGNOSIS — R600 Localized edema: Secondary | ICD-10-CM | POA: Insufficient documentation

## 2019-07-29 DIAGNOSIS — R0600 Dyspnea, unspecified: Secondary | ICD-10-CM

## 2019-07-29 DIAGNOSIS — G609 Hereditary and idiopathic neuropathy, unspecified: Secondary | ICD-10-CM | POA: Diagnosis not present

## 2019-07-29 DIAGNOSIS — M5417 Radiculopathy, lumbosacral region: Secondary | ICD-10-CM | POA: Diagnosis not present

## 2019-07-29 LAB — COMPREHENSIVE METABOLIC PANEL
ALT: 13 U/L (ref 0–53)
AST: 14 U/L (ref 0–37)
Albumin: 3.8 g/dL (ref 3.5–5.2)
Alkaline Phosphatase: 59 U/L (ref 39–117)
BUN: 31 mg/dL — ABNORMAL HIGH (ref 6–23)
CO2: 29 mEq/L (ref 19–32)
Calcium: 8.8 mg/dL (ref 8.4–10.5)
Chloride: 104 mEq/L (ref 96–112)
Creatinine, Ser: 1.24 mg/dL (ref 0.40–1.50)
GFR: 54.63 mL/min — ABNORMAL LOW (ref >60.00)
Glucose, Bld: 92 mg/dL (ref 70–99)
Potassium: 4.1 mEq/L (ref 3.5–5.1)
Sodium: 140 mEq/L (ref 135–145)
Total Bilirubin: 0.3 mg/dL (ref 0.2–1.2)
Total Protein: 6.3 g/dL (ref 6.0–8.3)

## 2019-07-29 NOTE — Addendum Note (Signed)
Addended by: Kelle Darting A on: 07/29/2019 02:10 PM   Modules accepted: Orders

## 2019-07-29 NOTE — Telephone Encounter (Signed)
Pt came in office stating saw specialist Neurologist and pt states that his specialist recommended that he need to get a referral for his leg situation, pt wants provider to get in contact with him at [phone] to explain directly about his situation. Pt stated spoke with provider this wk but needing referral for his legs. Please advise.

## 2019-07-29 NOTE — Telephone Encounter (Signed)
Sent message/answer on result note to [facility].

## 2019-07-30 ENCOUNTER — Other Ambulatory Visit: Payer: Self-pay | Admitting: Medical

## 2019-07-30 LAB — PAIN MGMT, PROFILE 8 W/CONF, U
6 Acetylmorphine: NEGATIVE ng/mL
Alcohol Metabolites: NEGATIVE ng/mL (ref <500)
Amphetamines: NEGATIVE ng/mL
Benzodiazepines: NEGATIVE ng/mL
Buprenorphine, Urine: NEGATIVE ng/mL
Buprenorphine: NEGATIVE ng/mL
Cocaine Metabolite: NEGATIVE ng/mL
Codeine: NEGATIVE ng/mL
Creatinine: 119.7 mg/dL
Hydrocodone: NEGATIVE ng/mL
Hydromorphone: NEGATIVE ng/mL
MDMA: NEGATIVE ng/mL
Marijuana Metabolite: NEGATIVE ng/mL
Morphine: NEGATIVE ng/mL
Norbuprenorphine: NEGATIVE ng/mL
Norhydrocodone: NEGATIVE ng/mL
Opiates: NEGATIVE ng/mL
Oxidant: NEGATIVE ug/mL
Oxycodone: NEGATIVE ng/mL
pH: 5.2 (ref 4.5–9.0)

## 2019-07-30 LAB — BRAIN NATRIURETIC PEPTIDE: Brain Natriuretic Peptide: 15 pg/mL (ref <100)

## 2019-08-03 DIAGNOSIS — M5136 Other intervertebral disc degeneration, lumbar region: Secondary | ICD-10-CM | POA: Diagnosis not present

## 2019-08-03 DIAGNOSIS — Z7409 Other reduced mobility: Secondary | ICD-10-CM | POA: Diagnosis not present

## 2019-08-03 DIAGNOSIS — M5417 Radiculopathy, lumbosacral region: Secondary | ICD-10-CM | POA: Diagnosis not present

## 2019-08-05 DIAGNOSIS — M5417 Radiculopathy, lumbosacral region: Secondary | ICD-10-CM | POA: Diagnosis not present

## 2019-08-05 DIAGNOSIS — M5136 Other intervertebral disc degeneration, lumbar region: Secondary | ICD-10-CM | POA: Diagnosis not present

## 2019-08-05 DIAGNOSIS — Z7409 Other reduced mobility: Secondary | ICD-10-CM | POA: Diagnosis not present

## 2019-08-10 DIAGNOSIS — M5136 Other intervertebral disc degeneration, lumbar region: Secondary | ICD-10-CM | POA: Diagnosis not present

## 2019-08-10 DIAGNOSIS — M5417 Radiculopathy, lumbosacral region: Secondary | ICD-10-CM | POA: Diagnosis not present

## 2019-08-10 DIAGNOSIS — Z7409 Other reduced mobility: Secondary | ICD-10-CM | POA: Diagnosis not present

## 2019-08-12 ENCOUNTER — Telehealth: Payer: Self-pay | Admitting: *Deleted

## 2019-08-12 NOTE — Telephone Encounter (Addendum)
Received paperwork for cpap supplies.  In red folder.    Looks like has seen pulmonology in past.    He stated that he went to a [facility] and they wrote for this and he does not want to go back there because it cost 100 per visit.    He is out of supplies and is unsure of pressure settings.  He states that he thinks his pressure settings are to high because it wakes him up at night and leaks.  Advised him he may need to be evaluated and that we do have specialists with [facility] that he can go to.  He is willing to go if needed.

## 2019-08-13 DIAGNOSIS — M5136 Other intervertebral disc degeneration, lumbar region: Secondary | ICD-10-CM | POA: Diagnosis not present

## 2019-08-13 DIAGNOSIS — M5417 Radiculopathy, lumbosacral region: Secondary | ICD-10-CM | POA: Diagnosis not present

## 2019-08-13 DIAGNOSIS — Z7409 Other reduced mobility: Secondary | ICD-10-CM | POA: Diagnosis not present

## 2019-08-14 ENCOUNTER — Telehealth: Payer: Self-pay | Admitting: Medical

## 2019-08-14 DIAGNOSIS — G4730 Sleep apnea, unspecified: Secondary | ICD-10-CM

## 2019-08-14 NOTE — Telephone Encounter (Signed)
I put in referral to pulmonlogist. I don't think  I have ever seen him for sleep apnea. So pt needs to sign release of information form so we can get records so pulmonologist can review. Also I think we should have phone visit so at least pulmonologist can review my note.

## 2019-08-16 NOTE — Telephone Encounter (Signed)
Patient notified that referral has been placed  

## 2019-08-17 DIAGNOSIS — Z7409 Other reduced mobility: Secondary | ICD-10-CM | POA: Diagnosis not present

## 2019-08-17 DIAGNOSIS — M5417 Radiculopathy, lumbosacral region: Secondary | ICD-10-CM | POA: Diagnosis not present

## 2019-08-17 DIAGNOSIS — M5136 Other intervertebral disc degeneration, lumbar region: Secondary | ICD-10-CM | POA: Diagnosis not present

## 2019-08-21 IMAGING — CR DG CHEST 2V
2 series · 2 of 2 positions shown · non-contrast
Comparison: 04/12/2015; chest CT-09/06/2015

CLINICAL DATA: Bilateral lower extremity swelling for the past 2
months. History of dyspnea.

EXAM:
CHEST  2 VIEW

[w chest pa]
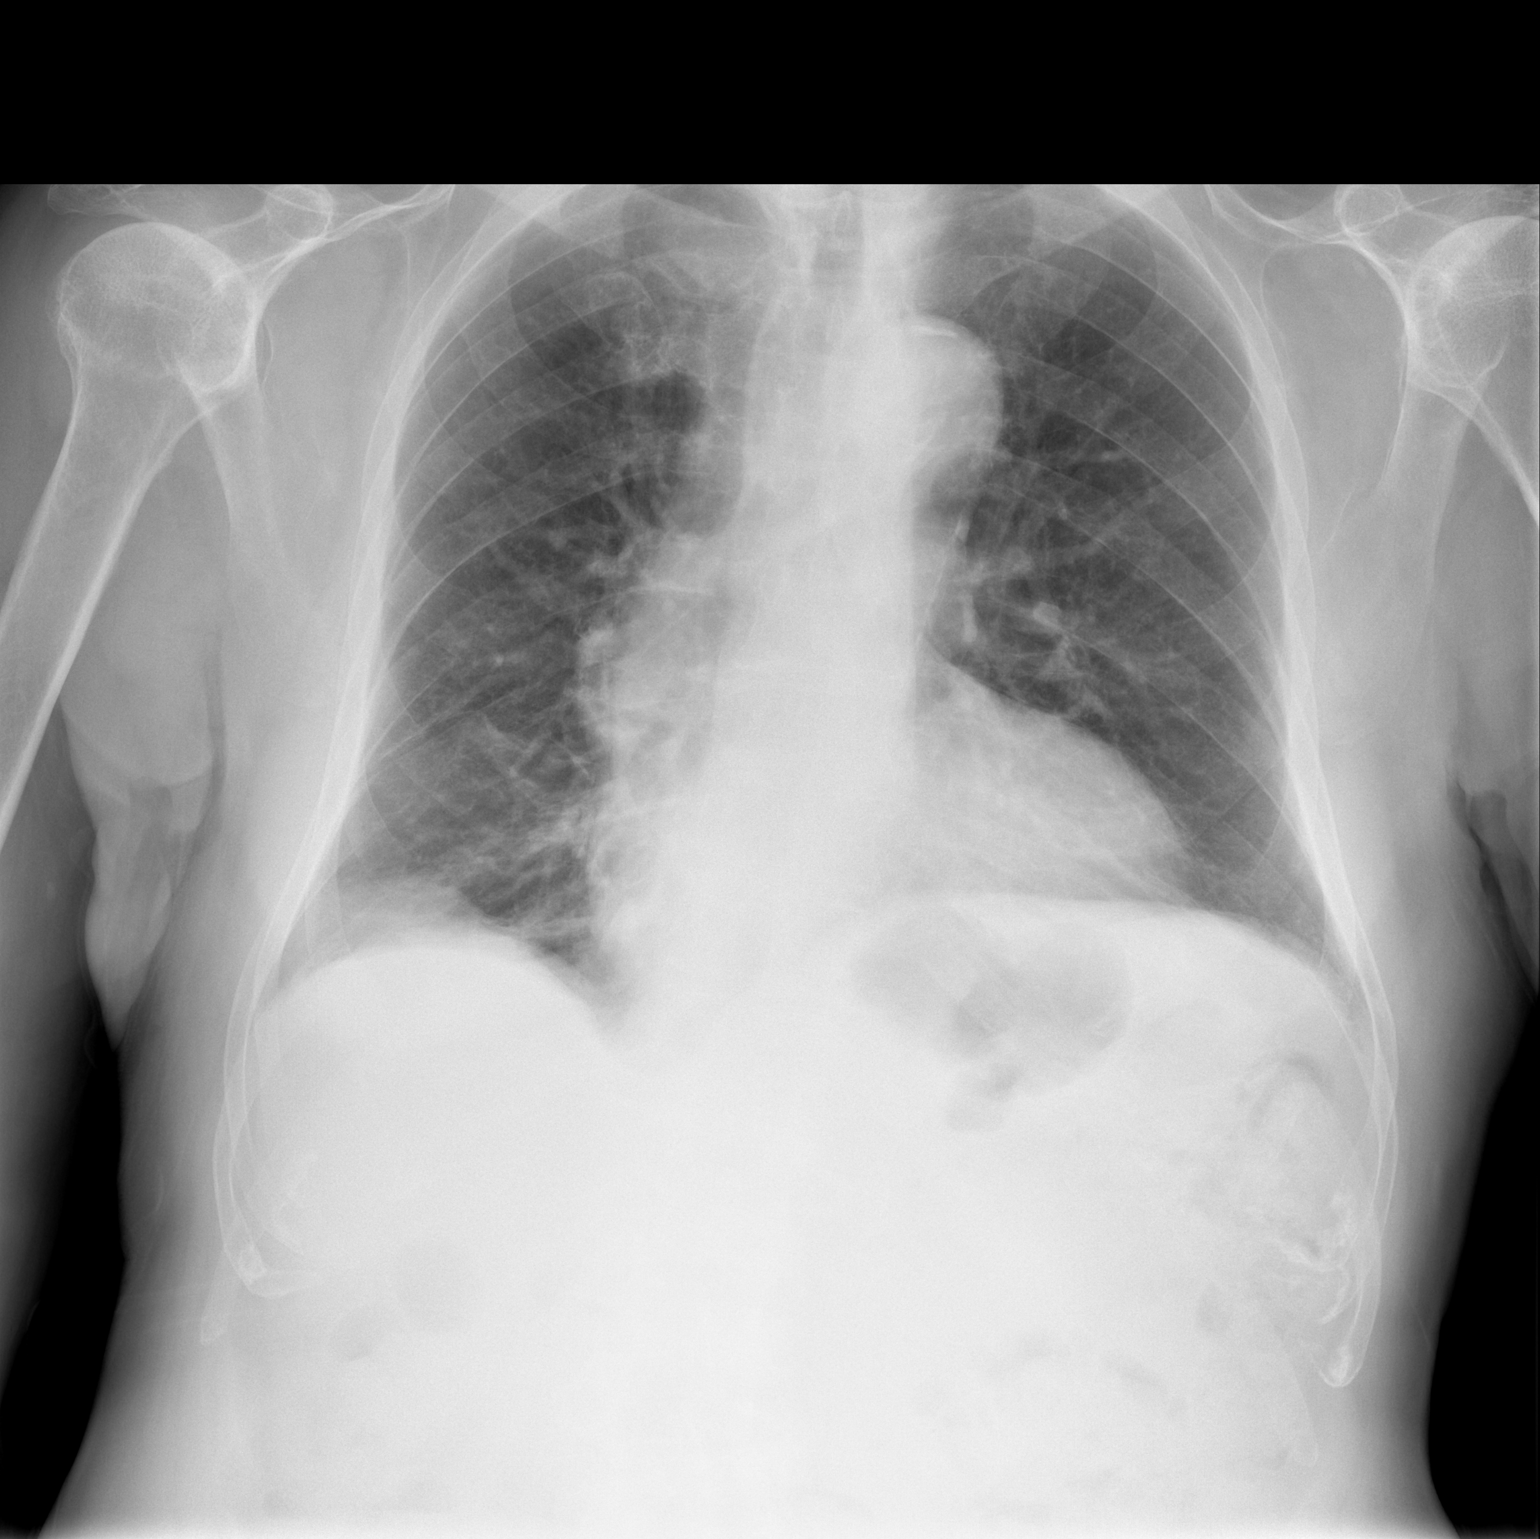

[w chest lat]
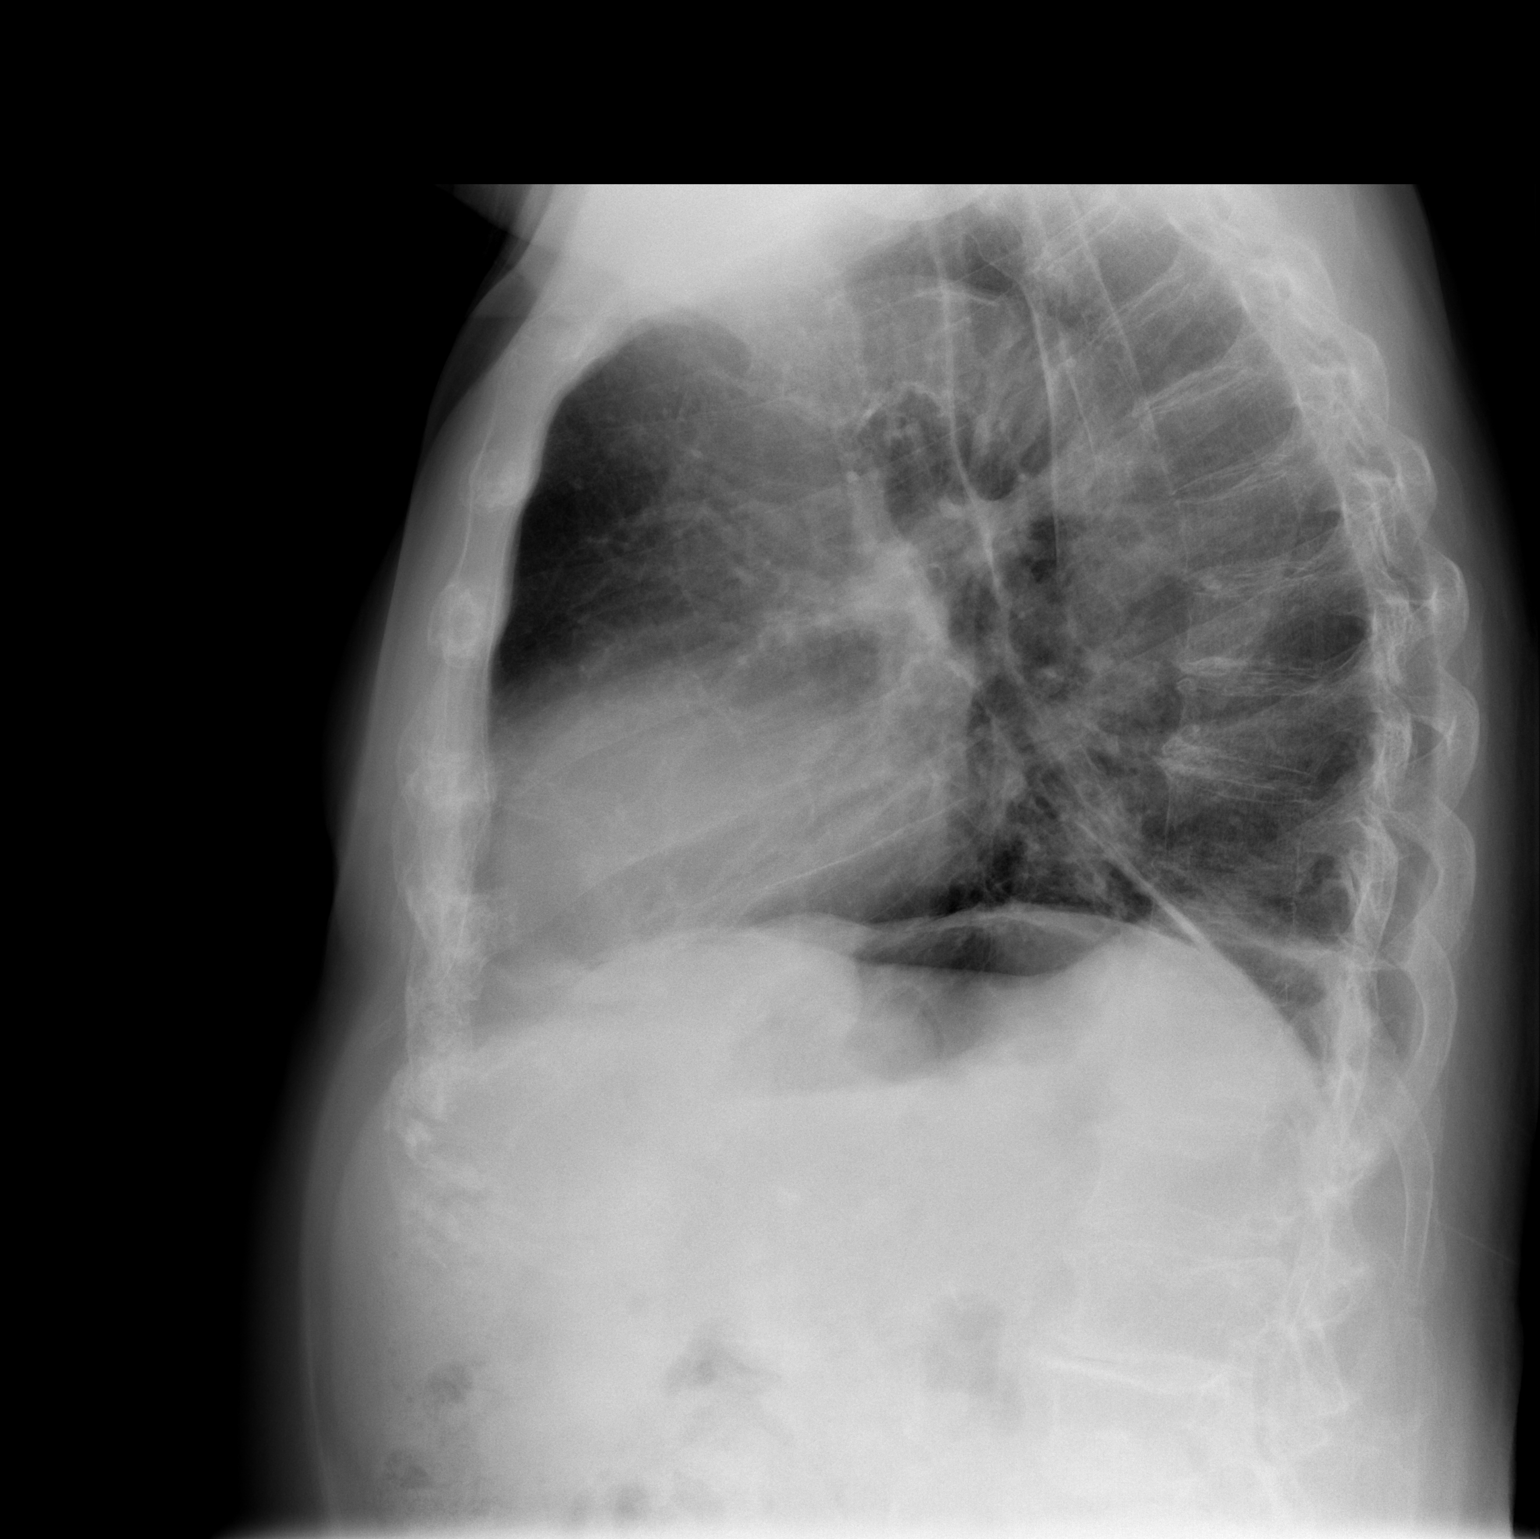

[2 of 2 positions shown; findings below may reference images not displayed]

FINDINGS: Grossly unchanged enlarged cardiac silhouette and mediastinal
contours given reduced lung volumes. Atherosclerotic plaque within
the thoracic aorta. There is persistent mild rightward tracheal
deviation at the level of the aortic arch. Bibasilar heterogeneous
opacities are unchanged in favored to represent atelectasis or scar.
No new focal airspace opacities. No pleural effusion or
pneumothorax. No evidence of edema. No acute osseous abnormalities.
IMPRESSION: Bibasilar atelectasis/scar without superimposed acute
cardiopulmonary disease. Specifically, no definite evidence of
pulmonary edema.

## 2019-08-24 ENCOUNTER — Telehealth: Payer: Self-pay | Admitting: Medical

## 2019-08-24 DIAGNOSIS — M5136 Other intervertebral disc degeneration, lumbar region: Secondary | ICD-10-CM | POA: Diagnosis not present

## 2019-08-24 DIAGNOSIS — M5417 Radiculopathy, lumbosacral region: Secondary | ICD-10-CM | POA: Diagnosis not present

## 2019-08-24 DIAGNOSIS — Z7409 Other reduced mobility: Secondary | ICD-10-CM | POA: Diagnosis not present

## 2019-08-24 NOTE — Telephone Encounter (Signed)
Glenetta Hew from [facility] called, states that patient weight is increasing  By 7lbs  In the last week and that O2 is. 95. Edema in legs rising above the knees.

## 2019-08-25 ENCOUNTER — Other Ambulatory Visit: Payer: Self-pay | Admitting: Medical

## 2019-08-25 NOTE — Telephone Encounter (Signed)
Tried to call pt to schedule an appt and in the middle of the call , their phone disconnected, will try to call again late

## 2019-08-25 NOTE — Telephone Encounter (Signed)
With increased eight and pedal edema would ask that he be scheduled for in office visit. Might need to do cxr and labs. Evaluate for chf. Want him to be seen before the weekend.

## 2019-08-25 NOTE — Telephone Encounter (Addendum)
Requesting: Tramadol Contract:08/23/19 UDS:07/27/19 Last Visit: 07/28/19 Next Visit: none Last Refill:07/27/19  Please Advise  Controlled med site review today.  Refilled tramadol today.

## 2019-08-26 ENCOUNTER — Other Ambulatory Visit: Payer: Self-pay | Admitting: Medical

## 2019-08-26 DIAGNOSIS — M5417 Radiculopathy, lumbosacral region: Secondary | ICD-10-CM | POA: Diagnosis not present

## 2019-08-26 DIAGNOSIS — M5136 Other intervertebral disc degeneration, lumbar region: Secondary | ICD-10-CM | POA: Diagnosis not present

## 2019-08-26 DIAGNOSIS — Z7409 Other reduced mobility: Secondary | ICD-10-CM | POA: Diagnosis not present

## 2019-08-27 NOTE — Telephone Encounter (Signed)
Ultram request was completed 08/25/19. Sent message to pharmacy

## 2019-08-31 DIAGNOSIS — M5417 Radiculopathy, lumbosacral region: Secondary | ICD-10-CM | POA: Diagnosis not present

## 2019-08-31 DIAGNOSIS — M5136 Other intervertebral disc degeneration, lumbar region: Secondary | ICD-10-CM | POA: Diagnosis not present

## 2019-08-31 DIAGNOSIS — Z7409 Other reduced mobility: Secondary | ICD-10-CM | POA: Diagnosis not present

## 2019-09-02 DIAGNOSIS — Z961 Presence of intraocular lens: Secondary | ICD-10-CM | POA: Diagnosis not present

## 2019-09-02 DIAGNOSIS — M5136 Other intervertebral disc degeneration, lumbar region: Secondary | ICD-10-CM | POA: Diagnosis not present

## 2019-09-02 DIAGNOSIS — H04123 Dry eye syndrome of bilateral lacrimal glands: Secondary | ICD-10-CM | POA: Diagnosis not present

## 2019-09-02 DIAGNOSIS — Z7409 Other reduced mobility: Secondary | ICD-10-CM | POA: Diagnosis not present

## 2019-09-02 DIAGNOSIS — M5417 Radiculopathy, lumbosacral region: Secondary | ICD-10-CM | POA: Diagnosis not present

## 2019-09-02 DIAGNOSIS — D4981 Neoplasm of unspecified behavior of retina and choroid: Secondary | ICD-10-CM | POA: Diagnosis not present

## 2019-09-02 DIAGNOSIS — H35373 Puckering of macula, bilateral: Secondary | ICD-10-CM | POA: Diagnosis not present

## 2019-09-04 ENCOUNTER — Other Ambulatory Visit: Payer: Self-pay | Admitting: Medical

## 2019-09-07 DIAGNOSIS — M5136 Other intervertebral disc degeneration, lumbar region: Secondary | ICD-10-CM | POA: Diagnosis not present

## 2019-09-07 DIAGNOSIS — M5417 Radiculopathy, lumbosacral region: Secondary | ICD-10-CM | POA: Diagnosis not present

## 2019-09-07 DIAGNOSIS — Z7409 Other reduced mobility: Secondary | ICD-10-CM | POA: Diagnosis not present

## 2019-09-09 ENCOUNTER — Other Ambulatory Visit: Payer: Self-pay

## 2019-09-09 DIAGNOSIS — M5136 Other intervertebral disc degeneration, lumbar region: Secondary | ICD-10-CM | POA: Diagnosis not present

## 2019-09-09 DIAGNOSIS — M5417 Radiculopathy, lumbosacral region: Secondary | ICD-10-CM | POA: Diagnosis not present

## 2019-09-09 DIAGNOSIS — Z7409 Other reduced mobility: Secondary | ICD-10-CM | POA: Diagnosis not present

## 2019-09-10 ENCOUNTER — Ambulatory Visit (INDEPENDENT_AMBULATORY_CARE_PROVIDER_SITE_OTHER): Payer: Medicare Other | Admitting: Medical

## 2019-09-10 ENCOUNTER — Other Ambulatory Visit: Payer: Self-pay

## 2019-09-10 VITALS — BP 125/56 | HR 94 | Temp 98.2°F | Resp 18 | Ht 70.0 in | Wt 191.0 lb

## 2019-09-10 DIAGNOSIS — I10 Essential (primary) hypertension: Secondary | ICD-10-CM | POA: Diagnosis not present

## 2019-09-10 DIAGNOSIS — R0609 Other forms of dyspnea: Secondary | ICD-10-CM

## 2019-09-10 DIAGNOSIS — R0600 Dyspnea, unspecified: Secondary | ICD-10-CM | POA: Diagnosis not present

## 2019-09-10 DIAGNOSIS — M7120 Synovial cyst of popliteal space [Baker], unspecified knee: Secondary | ICD-10-CM

## 2019-09-10 DIAGNOSIS — R600 Localized edema: Secondary | ICD-10-CM | POA: Diagnosis not present

## 2019-09-10 NOTE — Progress Notes (Signed)
   Subjective:    Patient ID: Brandon Mason, male    DOB: [birth year], 86 y.o.   MRN: [id number]  HPI   Pt in for recently had weght gain to 190.6, pitting edema and o2 sat 92-94% when he does PT.  Pt has been getting some sob and he has some wheezing occasionally.    Back in October he weighed 180 lb. End of January weight on flow chart states 94%.  Prior office visit with me at rest o2 sat 96%. Pt never smoked in past.   Pt most recent lower [place] in October showed not DVT. He did have small baker cyst.  Pt saw PT yesterday and she had concerns with the above  Pt has hx of remote dvt years ago. Dr Marin Olp stopped his eliquis in past.    Review of Systems  Constitutional: Negative for chills, fatigue and fever.  Respiratory: Negative for cough, chest tightness, shortness of breath and wheezing.        Pt does feel mild sob all the time.  Cardiovascular: Negative for chest pain and palpitations.  Gastrointestinal: Negative for abdominal pain.       Objective:   Physical Exam  General Mental Status- Alert. General Appearance- Not in acute distress.   Skin General: Color- Normal Color. Moisture- Normal Moisture.  Neck Carotid Arteries- Normal color. Moisture- Normal Moisture. No carotid bruits. No JVD.  Chest and Lung Exam Auscultation: Breath Sounds:-Normal.  Cardiovascular Auscultation:Rythm- Regular. Murmurs & Other Heart Sounds:Auscultation of the heart reveals- No Murmurs.  Abdomen Inspection:-Inspeection Normal. Palpation/Percussion:Note:No mass. Palpation and Percussion of the abdomen reveal- Non Tender, Non Distended + BS, no rebound or guarding.    Neurologic Cranial Nerve exam:- CN III-XII intact(No nystagmus), symmetric smile. Strength:- 5/5 equal and symmetric strength both upper and lower extremities.      Assessment & Plan:  For your pedal edema bilaterally.hx of dvt and  small baker cyst in past will get [place] of both lower ext. No later than  Monday.Sooner the better.  Will get cmp and bnp to assess for chf.  Decided to put in order for echo as your 02 sat did decrease to 92% on ambulation and you do have chronic pedal edema on past various visit with hx of htn.  Follow up 10 days or as needed. Pt using ted hose compression and not much improvement. Will offer ace wraps to see if that help.  Also keep legs elevated.   Pt expressed he would skip [place] today. Placed cxr and he declined this as well.(both done 6 weeks and he states not necessary)  40 minutes spent with pt. Time spent with patient discussing differetial dx pedal edema work up today and trying to get him to do full work up requested.  Mackie Pai, [name]

## 2019-09-10 NOTE — Patient Instructions (Addendum)
For your pedal edema bilaterally.hx of dvt and  small baker cyst in past will get [place] of both lower ext. No later than Monday.Sooner the better.  Will get cmp and bnp to assess for chf.  Decided to put in order for echo as your 02 sat did decrease to 92% on ambulation and you do have chronic pedal edema on past various visit with hx of htn.  Follow up 10 days or as needed. Pt using ted hose compression and not much improvement. Will offer ace wraps to see if that help. Also keep legs elevated.

## 2019-09-10 NOTE — Addendum Note (Signed)
Addended by: Kelle Darting A on: 09/10/2019 03:09 PM   Modules accepted: Orders

## 2019-09-11 LAB — COMPREHENSIVE METABOLIC PANEL
AG Ratio: 1.6 (calc) (ref 1.0–2.5)
ALT: 9 U/L (ref 9–46)
AST: 13 U/L (ref 10–35)
Albumin: 4 g/dL (ref 3.6–5.1)
Alkaline phosphatase (APISO): 57 U/L (ref 35–144)
BUN/Creatinine Ratio: 33 (calc) — ABNORMAL HIGH (ref 6–22)
BUN: 38 mg/dL — ABNORMAL HIGH (ref 7–25)
CO2: 27 mmol/L (ref 20–32)
Calcium: 9.3 mg/dL (ref 8.6–10.3)
Chloride: 102 mmol/L (ref 98–110)
Creat: 1.15 mg/dL — ABNORMAL HIGH (ref 0.70–1.11)
Globulin: 2.5 g/dL (calc) (ref 1.9–3.7)
Glucose, Bld: 93 mg/dL (ref 65–99)
Potassium: 4.2 mmol/L (ref 3.5–5.3)
Sodium: 138 mmol/L (ref 135–146)
Total Bilirubin: 0.4 mg/dL (ref 0.2–1.2)
Total Protein: 6.5 g/dL (ref 6.1–8.1)

## 2019-09-11 LAB — BRAIN NATRIURETIC PEPTIDE: Brain Natriuretic Peptide: 12 pg/mL (ref <100)

## 2019-09-14 DIAGNOSIS — M5417 Radiculopathy, lumbosacral region: Secondary | ICD-10-CM | POA: Diagnosis not present

## 2019-09-14 DIAGNOSIS — M5136 Other intervertebral disc degeneration, lumbar region: Secondary | ICD-10-CM | POA: Diagnosis not present

## 2019-09-14 DIAGNOSIS — Z7409 Other reduced mobility: Secondary | ICD-10-CM | POA: Diagnosis not present

## 2019-09-15 ENCOUNTER — Ambulatory Visit (HOSPITAL_BASED_OUTPATIENT_CLINIC_OR_DEPARTMENT_OTHER)
Admission: RE | Admit: 2019-09-15 | Discharge: 2019-09-15 | Disposition: A | Discharge status: Home / Self Care | Payer: Medicare Other | Source: Ambulatory Visit | Attending: Medical | Admitting: Medical

## 2019-09-15 ENCOUNTER — Other Ambulatory Visit: Payer: Self-pay

## 2019-09-15 DIAGNOSIS — I700 Atherosclerosis of aorta: Secondary | ICD-10-CM | POA: Diagnosis not present

## 2019-09-15 DIAGNOSIS — E119 Type 2 diabetes mellitus without complications: Secondary | ICD-10-CM | POA: Diagnosis not present

## 2019-09-15 DIAGNOSIS — R310 Gross hematuria: Secondary | ICD-10-CM | POA: Diagnosis not present

## 2019-09-15 DIAGNOSIS — S3991XA Unspecified injury of abdomen, initial encounter: Secondary | ICD-10-CM | POA: Diagnosis not present

## 2019-09-15 DIAGNOSIS — T83518A Infection and inflammatory reaction due to other urinary catheter, initial encounter: Secondary | ICD-10-CM | POA: Diagnosis not present

## 2019-09-15 DIAGNOSIS — R600 Localized edema: Secondary | ICD-10-CM | POA: Diagnosis not present

## 2019-09-15 DIAGNOSIS — N342 Other urethritis: Secondary | ICD-10-CM | POA: Diagnosis not present

## 2019-09-15 DIAGNOSIS — R338 Other retention of urine: Secondary | ICD-10-CM | POA: Diagnosis not present

## 2019-09-15 DIAGNOSIS — K5730 Diverticulosis of large intestine without perforation or abscess without bleeding: Secondary | ICD-10-CM | POA: Diagnosis not present

## 2019-09-15 DIAGNOSIS — R0600 Dyspnea, unspecified: Secondary | ICD-10-CM | POA: Diagnosis not present

## 2019-09-15 DIAGNOSIS — R339 Retention of urine, unspecified: Secondary | ICD-10-CM | POA: Diagnosis not present

## 2019-09-15 DIAGNOSIS — R319 Hematuria, unspecified: Secondary | ICD-10-CM | POA: Diagnosis not present

## 2019-09-15 DIAGNOSIS — R0609 Other forms of dyspnea: Secondary | ICD-10-CM

## 2019-09-15 DIAGNOSIS — M7120 Synovial cyst of popliteal space [Baker], unspecified knee: Secondary | ICD-10-CM | POA: Diagnosis not present

## 2019-09-15 DIAGNOSIS — I10 Essential (primary) hypertension: Secondary | ICD-10-CM | POA: Diagnosis not present

## 2019-09-15 DIAGNOSIS — N401 Enlarged prostate with lower urinary tract symptoms: Secondary | ICD-10-CM | POA: Diagnosis not present

## 2019-09-16 ENCOUNTER — Ambulatory Visit (HOSPITAL_BASED_OUTPATIENT_CLINIC_OR_DEPARTMENT_OTHER)
Admission: RE | Admit: 2019-09-16 | Discharge: 2019-09-16 | Disposition: A | Discharge status: Home / Self Care | Payer: Medicare Other | Source: Ambulatory Visit | Attending: Medical | Admitting: Medical

## 2019-09-16 DIAGNOSIS — R0609 Other forms of dyspnea: Secondary | ICD-10-CM

## 2019-09-16 DIAGNOSIS — R0600 Dyspnea, unspecified: Secondary | ICD-10-CM | POA: Diagnosis not present

## 2019-09-16 DIAGNOSIS — R319 Hematuria, unspecified: Secondary | ICD-10-CM | POA: Diagnosis not present

## 2019-09-16 DIAGNOSIS — I10 Essential (primary) hypertension: Secondary | ICD-10-CM | POA: Diagnosis not present

## 2019-09-16 DIAGNOSIS — R600 Localized edema: Secondary | ICD-10-CM | POA: Insufficient documentation

## 2019-09-16 DIAGNOSIS — S3991XA Unspecified injury of abdomen, initial encounter: Secondary | ICD-10-CM | POA: Diagnosis not present

## 2019-09-16 NOTE — Progress Notes (Signed)
  Echocardiogram 2D Echocardiogram has been performed.  Cardell Peach 09/16/2019, 10:42 AM

## 2019-09-17 ENCOUNTER — Telehealth: Payer: Self-pay | Admitting: Medical

## 2019-09-17 NOTE — Telephone Encounter (Signed)
Would you like to see him In office ?

## 2019-09-17 NOTE — Telephone Encounter (Signed)
Patient had a recent visit to the ER bcz he had blood in his urine. He states he wants a referral to a urologist. ER did refer him to one but his appt is not until late May and he wants to get in sooner. Peldase advise

## 2019-09-18 DIAGNOSIS — R319 Hematuria, unspecified: Secondary | ICD-10-CM | POA: Diagnosis not present

## 2019-09-18 DIAGNOSIS — R338 Other retention of urine: Secondary | ICD-10-CM | POA: Diagnosis present

## 2019-09-18 DIAGNOSIS — E785 Hyperlipidemia, unspecified: Secondary | ICD-10-CM | POA: Diagnosis present

## 2019-09-18 DIAGNOSIS — N3091 Cystitis, unspecified with hematuria: Secondary | ICD-10-CM | POA: Diagnosis present

## 2019-09-18 DIAGNOSIS — Z20822 Contact with and (suspected) exposure to covid-19: Secondary | ICD-10-CM | POA: Diagnosis present

## 2019-09-18 DIAGNOSIS — N342 Other urethritis: Secondary | ICD-10-CM | POA: Diagnosis present

## 2019-09-18 DIAGNOSIS — Z9842 Cataract extraction status, left eye: Secondary | ICD-10-CM | POA: Diagnosis not present

## 2019-09-18 DIAGNOSIS — R310 Gross hematuria: Secondary | ICD-10-CM | POA: Diagnosis not present

## 2019-09-18 DIAGNOSIS — Z8249 Family history of ischemic heart disease and other diseases of the circulatory system: Secondary | ICD-10-CM | POA: Diagnosis not present

## 2019-09-18 DIAGNOSIS — Z7982 Long term (current) use of aspirin: Secondary | ICD-10-CM | POA: Diagnosis not present

## 2019-09-18 DIAGNOSIS — G2581 Restless legs syndrome: Secondary | ICD-10-CM | POA: Diagnosis present

## 2019-09-18 DIAGNOSIS — N410 Acute prostatitis: Secondary | ICD-10-CM | POA: Diagnosis not present

## 2019-09-18 DIAGNOSIS — Z79899 Other long term (current) drug therapy: Secondary | ICD-10-CM | POA: Diagnosis not present

## 2019-09-18 DIAGNOSIS — R339 Retention of urine, unspecified: Secondary | ICD-10-CM | POA: Diagnosis not present

## 2019-09-18 DIAGNOSIS — I10 Essential (primary) hypertension: Secondary | ICD-10-CM | POA: Diagnosis present

## 2019-09-18 DIAGNOSIS — N401 Enlarged prostate with lower urinary tract symptoms: Secondary | ICD-10-CM | POA: Diagnosis present

## 2019-09-18 DIAGNOSIS — N411 Chronic prostatitis: Secondary | ICD-10-CM | POA: Diagnosis not present

## 2019-09-18 DIAGNOSIS — E119 Type 2 diabetes mellitus without complications: Secondary | ICD-10-CM | POA: Diagnosis present

## 2019-09-18 DIAGNOSIS — Z8673 Personal history of transient ischemic attack (TIA), and cerebral infarction without residual deficits: Secondary | ICD-10-CM | POA: Diagnosis not present

## 2019-09-18 DIAGNOSIS — N400 Enlarged prostate without lower urinary tract symptoms: Secondary | ICD-10-CM | POA: Diagnosis not present

## 2019-09-18 DIAGNOSIS — T83518A Infection and inflammatory reaction due to other urinary catheter, initial encounter: Secondary | ICD-10-CM | POA: Diagnosis present

## 2019-09-18 DIAGNOSIS — Z9841 Cataract extraction status, right eye: Secondary | ICD-10-CM | POA: Diagnosis not present

## 2019-09-20 MED ORDER — FINASTERIDE 5 MG PO TABS
5.00 | ORAL_TABLET | ORAL | Status: DC
Start: 2019-09-20 — End: 2019-09-20

## 2019-09-20 MED ORDER — GENERIC EXTERNAL MEDICATION
1.00 | Status: DC
Start: 2019-09-20 — End: 2019-09-20

## 2019-09-20 MED ORDER — ACETAMINOPHEN 325 MG PO TABS
650.00 | ORAL_TABLET | ORAL | Status: DC
Start: ? — End: 2019-09-20

## 2019-09-20 MED ORDER — ONDANSETRON HCL 4 MG/2ML IJ SOLN
4.00 | INTRAMUSCULAR | Status: DC
Start: ? — End: 2019-09-20

## 2019-09-20 MED ORDER — ROPINIROLE HCL 2 MG PO TABS
4.00 | ORAL_TABLET | ORAL | Status: DC
Start: 2019-09-20 — End: 2019-09-20

## 2019-09-20 MED ORDER — LISINOPRIL 20 MG PO TABS
20.00 | ORAL_TABLET | ORAL | Status: DC
Start: 2019-09-20 — End: 2019-09-20

## 2019-09-20 MED ORDER — AMLODIPINE BESYLATE 5 MG PO TABS
10.00 | ORAL_TABLET | ORAL | Status: DC
Start: 2019-09-20 — End: 2019-09-20

## 2019-09-20 MED ORDER — TRAMADOL HCL 50 MG PO TABS
50.00 | ORAL_TABLET | ORAL | Status: DC
Start: ? — End: 2019-09-20

## 2019-09-20 MED ORDER — GABAPENTIN 300 MG PO CAPS
600.00 | ORAL_CAPSULE | ORAL | Status: DC
Start: 2019-09-20 — End: 2019-09-20

## 2019-09-20 NOTE — Discharge Summary (Signed)
 Hospitalist Discharge Summary at [facility]  Name: RYNELL CIOTTI MRN: [id number] Age: 86 yrs DOB: 09-22-1925  Admit date: 09/18/2019 Discharge date and time:  09/20/2019  Admitting Physician: Alm Josue Sheffield, MD  Discharge Physician: Mitchel Scot Saddler, MD  Admission Diagnoses:  Urinary retention [R33.9] Hematuria [R31.9] Hematuria, unspecified type [R31.9]   Discharge Diagnoses:  Principal Problem:   Hematuria Active Problems:   Controlled type 2 diabetes mellitus without complication [facility])   Hypercholesteremia   Essential hypertension   Restless legs syndrome Resolved Problems:   Gross hematuria   Hospital Course:  For full details, please see H&P, progress notes, consult notes and ancillary notes.   Mr.Sheldon is a 86 yo M with h/o T2DM, HLD, HTN, RLS presented with hematuria and difficulty with urination being admitted for further evaluation and mgmt.  # hematuria # difficulty urination # friable large prostate # BPH - since 5-6 days ago; has been seeing blood clots - developed difficulty with urination for 3 days - maintained on indwelling foley with frequent irrigation while in hospital - urology consulted  - cystoscopic evaluation revealed large friable prostate s/p median lobe resection - recommends starting finasteride  5 mg daily - patient will follow-up with Urology in clinic to determine if there is continual need for foley catheter and monitoring of BPH and surgical pathology results - tolerated PO well  # HTN - continue lisinopril  - continue amlodipine   # RLS - continue requip  - continue gabapentin   Disposition: Home or Self Care   Discharge Follow-up Action Items: Continue Finasteride  5 mg daily Follow-up with Urology   Patient's Ordered Code Status: Full Code  Consults: IP CONSULT TO UROLOGY IP CONSULT TO HOSPITALIST (GENERAL)   Physical Exam on the Day of Discharge: General: Alert, oriented X 4, no apparent distress.   Head-Eyes-Ears-Nose-Throat: EOMI, pupils equal, round and reactive to light,  oropharynx clear, no LAD.  Cardiovascular: Regular rate and rhythm, s1, s2, no murmurs.  Chest/Lungs: Clear to auscultation bilaterally, no wheezes or rhonchi.  Abdomen: Positive bowel sounds. Abdomen soft, nondistended, nontender. No HSM, rebound or guarding.  Extremities: 2+ peripheral pulses     Patient Instructions:  Current Discharge Medication List    START taking these medications   Details  finasteride  (PROSCAR ) 5 mg tablet Take 1 tablet (5 mg total) by mouth daily. Qty: 30 tablet, Refills: 1      CONTINUE these medications which have NOT CHANGED   Details  amLODIPine  (NORVASC ) 10 MG tablet TAKE 1 TABLET BY MOUTH ONCE DAILY    aspirin  325 MG tablet  *ANTIPLATELET* Take 325 mg by mouth daily.    gabapentin  (NEURONTIN ) 600 MG tablet Take 600 mg by mouth 3 times daily.    hydroCHLOROthiazide  (HYDRODIURIL ) 25 MG tablet TAKE 1 TABLET BY MOUTH ONCE DAILY    lisinopril  (PRINIVIL ,ZESTRIL ) 20 MG tablet TAKE ONE TABLET BY MOUTH ONCE DAILY Qty: 30 tablet, Refills: 0   Associated Diagnoses: Essential hypertension    naproxen sodium (ALEVE) 220 MG tablet Take 220 mg by mouth 2 times daily with meals.    rOPINIRole  (REQUIP ) 4 MG tablet Take 4 mg by mouth nightly.       traMADol  (ULTRAM ) 50 mg tablet Take 50 mg by mouth 3 times daily.       nystatin  (MYCOSTATIN ) 100,000 unit/gram cream Apply topically.          Discharge Orders and Instructions    Diet At Discharge   Complete by: As directed    Recommended diet at discharge:  Low-Salt diet   Activity At Discharge   Complete by: As directed    Recommended activity at discharge: Activity as tolerated      Follow-up  [facility] APPOINTMENTS    Appointment Date and Time Provider Department Dept Phone Address   09/21/2019 9:45 AM Almarie Janas [facility] - [address] [place] [facility]) [phone]  [address] [facility] [place] [address]   09/23/2019 9:45 AM Al[facility] Jan[facility] - [address] [place] [facility]) [phone] [address] [facility] [place] [address]   09/27/2019 10:30 AM Juliene Lonni Moores, MD [facility]rology - [place] [phone] [address] [address]   09/29/2019 10:30 AM Cooper Leonce Sar, MD [facility] - [place] [phone] [address] [facility] [place] [address]        Future Appointments  Date Time Provider Department Center  09/21/2019  9:45 AM Almarie Janas [id number] [address]  09/23/2019  9:45 AM Almarie Janas [id number] [address]  09/27/2019 10:30 AM Juliene Lonni Moores, MD [id number]  09/29/2019 10:30 AM Cooper Leonce Sar, MD [id number]     Total time spend on discharging this patient, including the last patient exam, discussing the hospital stay, instructions for ongoing care as it relates to all pertinent caregivers, as well as preparing the medical discharge records, prescriptions, and/or referrals as applicable, is 35 minutes.  Electronically signed by: Jia Hao Liang, MD 09/20/2019     Electronically signed by: Jia Hao Liang, MD 09/20/19 [phone]

## 2019-09-20 NOTE — Telephone Encounter (Signed)
Pt went to the ED. Saw urologist and appears had cystoscopy.

## 2019-09-23 ENCOUNTER — Other Ambulatory Visit: Payer: Self-pay | Admitting: Medical

## 2019-09-23 DIAGNOSIS — M5417 Radiculopathy, lumbosacral region: Secondary | ICD-10-CM | POA: Diagnosis not present

## 2019-09-23 DIAGNOSIS — Z7409 Other reduced mobility: Secondary | ICD-10-CM | POA: Diagnosis not present

## 2019-09-23 DIAGNOSIS — M5136 Other intervertebral disc degeneration, lumbar region: Secondary | ICD-10-CM | POA: Diagnosis not present

## 2019-09-24 NOTE — Telephone Encounter (Addendum)
Requesting:tramadol  Contract:yes UDS:low risk  Last OV:09/10/19 Next OV:n/a Last Refill: 08/25/19 #90-0rf Database: 09/26/2019.  Rx tramadol refill sent in.   Please advise

## 2019-09-27 DIAGNOSIS — M5417 Radiculopathy, lumbosacral region: Secondary | ICD-10-CM | POA: Diagnosis not present

## 2019-09-27 DIAGNOSIS — G629 Polyneuropathy, unspecified: Secondary | ICD-10-CM | POA: Diagnosis not present

## 2019-09-27 DIAGNOSIS — G2581 Restless legs syndrome: Secondary | ICD-10-CM | POA: Diagnosis not present

## 2019-09-29 DIAGNOSIS — N401 Enlarged prostate with lower urinary tract symptoms: Secondary | ICD-10-CM | POA: Insufficient documentation

## 2019-09-29 DIAGNOSIS — N400 Enlarged prostate without lower urinary tract symptoms: Secondary | ICD-10-CM | POA: Insufficient documentation

## 2019-09-29 DIAGNOSIS — R310 Gross hematuria: Secondary | ICD-10-CM | POA: Diagnosis not present

## 2019-09-29 DIAGNOSIS — Z48816 Encounter for surgical aftercare following surgery on the genitourinary system: Secondary | ICD-10-CM | POA: Diagnosis not present

## 2019-09-30 ENCOUNTER — Other Ambulatory Visit: Payer: Self-pay | Admitting: Medical

## 2019-10-07 ENCOUNTER — Other Ambulatory Visit: Payer: Self-pay | Admitting: Medical

## 2019-10-15 DIAGNOSIS — G2581 Restless legs syndrome: Secondary | ICD-10-CM | POA: Diagnosis not present

## 2019-10-15 DIAGNOSIS — G629 Polyneuropathy, unspecified: Secondary | ICD-10-CM | POA: Diagnosis not present

## 2019-11-04 ENCOUNTER — Other Ambulatory Visit: Payer: Self-pay | Admitting: Medical

## 2019-11-05 NOTE — Telephone Encounter (Signed)
Last Tramadol RX:  09/26/19, #90 Last OV: 09/10/19 Next OV: none scheduled UDS:  07/27/19 CSC:  07/27/19

## 2019-11-05 NOTE — Telephone Encounter (Signed)
Rx tramadol sent to pt pharmacy. 

## 2019-11-06 ENCOUNTER — Other Ambulatory Visit: Payer: Self-pay | Admitting: Medical

## 2019-11-08 ENCOUNTER — Other Ambulatory Visit: Payer: Self-pay

## 2019-11-08 ENCOUNTER — Ambulatory Visit (INDEPENDENT_AMBULATORY_CARE_PROVIDER_SITE_OTHER): Payer: Medicare Other | Admitting: Medical

## 2019-11-08 VITALS — BP 127/77 | HR 91 | Temp 97.4°F | Resp 18 | Ht 70.0 in | Wt 194.0 lb

## 2019-11-08 DIAGNOSIS — L089 Local infection of the skin and subcutaneous tissue, unspecified: Secondary | ICD-10-CM | POA: Diagnosis not present

## 2019-11-08 DIAGNOSIS — R0600 Dyspnea, unspecified: Secondary | ICD-10-CM

## 2019-11-08 DIAGNOSIS — R600 Localized edema: Secondary | ICD-10-CM | POA: Diagnosis not present

## 2019-11-08 DIAGNOSIS — R319 Hematuria, unspecified: Secondary | ICD-10-CM

## 2019-11-08 MED ORDER — MUPIROCIN 2 % EX OINT: TOPICAL_OINTMENT | CUTANEOUS | 0 refills | Status: DC

## 2019-11-08 MED ORDER — FUROSEMIDE 20 MG PO TABS
20.0000 mg | ORAL_TABLET | Freq: Every day | ORAL | 0 refills | Status: DC
Start: 2019-11-08 — End: 2020-03-14

## 2019-11-08 MED ORDER — CEPHALEXIN 500 MG PO CAPS
500.0000 mg | ORAL_CAPSULE | Freq: Two times a day (BID) | ORAL | 0 refills | Status: DC
Start: 2019-11-08 — End: 2020-03-10

## 2019-11-08 NOTE — Progress Notes (Addendum)
Subjective:    Patient ID: Brandon Mason, male    DOB: 06-Mar-1926, 86 y.o.   MRN: [id number]  HPI  Pt in for follow up.   Pt legs are very swollen. He states his legs are more swollen than usual and he states still short of breath at time. On further discussion describes close to his baseline. At test in office o2 sat 94-96%. Pt states able to sleep in his bed on his side. Pt wt 194. In his hisotrical range but on high side.  Pt states legs are feeling like burning. He has hx of restless leg syndrome.   In the past with pt pedal edema chest xray has been negative for chf. Also I have done 3 [place]    Pt also had blood in urine in past when seen by ED at [facility]. Pt was seen by urologist out pt after getting a cystosccopy and stopping area that was bleeding.  IMPRESSION: 1. Blood products/clot within the bladder. A bladder mass is not excluded. Further evaluation with cystoscopy is recommended. 2. Colonic diverticulosis. No bowel obstruction. Normal appendix. 3. Enlarged prostate gland with median lobe hypertrophy and postsurgical changes of TURP. 4. Aortic Atherosclerosis (ICD10-I70.0).   Pt expresses reluctance for me to get cxr and [place] as his test are always negative recently.  Pt is using diuretic daily.   Review of Systems  Constitutional: Negative for chills, fatigue and fever.  Respiratory: Negative for cough, chest tightness, shortness of breath and wheezing.   Cardiovascular: Negative for chest pain and palpitations.  Gastrointestinal: Negative for abdominal pain, constipation, nausea and vomiting.  Genitourinary: Negative for enuresis and flank pain.  Musculoskeletal: Negative for back pain, myalgias and neck pain.  Skin: Negative for rash.  Neurological: Negative for syncope, facial asymmetry, weakness, light-headedness and headaches.  Hematological: Negative for adenopathy. Does not bruise/bleed easily.   Past Medical History:  Diagnosis Date  . Abnormality of  gait 02/09/2013  . Arthritis   . Cancer ([place])    basal cell skin cancer  . Cardiomegaly   . Chronic kidney disease    BPH  . Chronic leg pain   . Hypersomnia   . Hypertension   . Hypoxia    pulmonary  . Neuromuscular disorder (HCC)    restless leg syndrome   . Nocturia   . Prostate disorder   . Restless legs syndrome (RLS) 02/09/2013  . Restless legs syndrome with nocturnal myoclonus   . RLS (restless legs syndrome)    x 40 years  . Shortness of breath    with exertion   . Sleep apnea    uses cpap  . Stroke [facility])    minor stroke in 2003 - no lasting side effects  . UARS (upper airway resistance syndrome) 06/28/2014     Social History   Socioeconomic History  . Marital status: Married    Spouse name: [name]  . Number of children: 5  . Years of education: 69  . Highest education level: Not on file  Occupational History  . 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 Smoking status: Never Smoker  . Smokeless tobacco: Never Used  Substance and Sexual Activity  . Alcohol use: Yes    Alcohol/week: 2.0 standard drinks    Types: 2 Cans of beer per week    Comment: weekly  . Drug use: No  . Sexual activity: Not on file  Other Topics Concern  .  Not on file  Social History Narrative   Patient lives at home with his wife [name].   Patient is retired.    Patient has 5 children.   Patient has a high school education.   Patient is right-handed.   Patient drinks very little caffeine.   Social Determinants of Health   Financial Resource Strain:   . Difficulty of Paying Living Expenses:   Food Insecurity:   . Worried About Charity fundraiser in the Last Year:   . Arboriculturist in the Last Year:   Transportation Needs:   . Film/video editor (Medical):   Marland Kitchen Lack of Transportation (Non-Medical):   Physical Activity:   . Days of Exercise per Week:   . Minutes of Exercise per Session:   Stress:   . Feeling of Stress :   Social  Connections:   . Frequency of Communication with Friends and Family:   . Frequency of Social Gatherings with Friends and Family:   . Attends Religious Services:   . Active Member of Clubs or Organizations:   . Attends Archivist Meetings:   Marland Kitchen Marital Status:   Intimate Partner Violence:   . Fear of Current or Ex-Partner:   . Emotionally Abused:   Marland Kitchen Physically Abused:   . Sexually Abused:     Past Surgical History:  Procedure Laterality Date  . ANTERIOR CERVICAL DECOMP/DISCECTOMY FUSION N/A 03/29/2016   Procedure: ANTERIOR CERVICAL DECOMPRESSION/DISCECTOMY FUSION CERVICAL THREE- CERVICAL FOUR;  Surgeon: Ashok Pall, MD;  Location: [facility];  Service: Neurosurgery;  Laterality: N/A;  . BACK SURGERY  2003   L4-5  . CATARACT EXTRACTION Bilateral   . HERNIA REPAIR     inguinal hernia  . TRANSURETHRAL PROSTATECTOMY WITH GYRUS INSTRUMENTS  04/28/2012   Procedure: TRANSURETHRAL PROSTATECTOMY WITH GYRUS INSTRUMENTS;  Surgeon: Fredricka Bonine, MD;  Location: [facility];  Service: Urology;  Laterality: N/A;  . TRANSURETHRAL RESECTION OF PROSTATE  03/24/2012   Procedure: TRANSURETHRAL RESECTION OF THE PROSTATE (TURP);  Surgeon: Fredricka Bonine, MD;  Location: [facility];  Service: Urology;  Laterality: N/A;  with gyrus ,Greenlight PVP laser of Prostate    Family History  Problem Relation Age of Onset  . Restless legs syndrome Mother   . Restless legs syndrome Daughter   . Esophageal cancer Neg Hx   . Colon cancer Neg Hx   . Pancreatic cancer Neg Hx   . Stomach cancer Neg Hx     No Known Allergies  Current Outpatient Medications on File Prior to Visit  Medication Sig Dispense Refill  . amLODipine (NORVASC) 10 MG tablet Take 1 tablet by mouth once daily 90 tablet 0  . gabapentin (NEURONTIN) 600 MG tablet TAKE 1 TABLET BY MOUTH THREE TIMES DAILY 270 tablet 0  . lisinopril (ZESTRIL) 20 MG tablet Take 1 tablet by mouth once daily 90 tablet 0  . meloxicam (MOBIC) 7.5  MG tablet TAKE 1 TO 2 TABLETS BY MOUTH ONCE DAILY 16 tablet 0  . nystatin cream (MYCOSTATIN) APPLY  CREAM TOPICALLY TO AFFECTED AREA TWICE DAILY 30 g 0  . rOPINIRole (REQUIP) 4 MG tablet TAKE 1 TABLET BY MOUTH AT BEDTIME 30 tablet 2  . traMADol (ULTRAM) 50 MG tablet TAKE 1 TABLET BY MOUTH EVERY 8 HOURS AS NEEDED FOR PAIN 90 tablet 0   No current facility-administered medications on file prior to visit.    BP 127/77 (BP Location: Right Arm, Patient Position: Sitting, Cuff Size: Normal)  Pulse 91   Temp (!) 97.4 F (36.3 C)   Resp 18   Ht 5\' 10"  (1.778 m)   Wt 194 lb (88 kg)   SpO2 90%   BMI 27.84 kg/m       Objective:   Physical Exam  General Mental Status- Alert. General Appearance- Not in acute distress.   Skin General: Color- Normal Color. Moisture- Normal Moisture.  Neck Carotid Arteries- Normal color. Moisture- Normal Moisture. No carotid bruits. No JVD.  Chest and Lung Exam Auscultation: Breath Sounds:-Normal.  Cardiovascular Auscultation:Rythm- Regular. Murmurs & Other Heart Sounds:Auscultation of the heart reveals- No Murmurs.  Abdomen Inspection:-Inspeection Normal. Palpation/Percussion:Note:No mass. Palpation and Percussion of the abdomen reveal- Non Tender, Non Distended + BS, no rebound or guarding.    Neurologic Cranial Nerve exam:- CN III-XII intact(No nystagmus), symmetric smile. Strength:- 5/5 equal and symmetric strength both upper and lower extremities.  Lower ext- bilateral moderate to severe symmetric pedal edema. Negative homans. Left lower ext pretibial small broken down area with serosanginous drainage. Mild redness around area..      Assessment & Plan:  You do have moderate to severe pedal edema bilaterally.  In the past chest x-rays, BNP and ultrasound did not show CHF or DVT.  You expressed reluctance to do either the studies today.  However I do want you weigh yourself  daily and if your weight continues to increase then do want to  get a chest x-ray.  If you get any pain behind behind either knee then let me know and I will get repeat ultraound as we have done in the past.  Presently will treat you for dependent edema.  Make sure you elevate your legs as much as possible.  I am stopping your HCTZ and prescribing low-dose Lasix 20 mg every day.  I do want you to follow-up 1 week from today and will check your blood pressure and recheck your electrolytes.  You do have you have broken down area left lower ext. Rx keflex oral antibiotic and mupirocin pending wound culture.  Follow up in one week or as needed  Time spent with patient today was 40   minutes which consisted of chart revdiew, discussing diagnosis, work u, treatment and documentation.  Mackie Pai, [name]   Sending to supervising DO to review.

## 2019-11-08 NOTE — Patient Instructions (Addendum)
You do have moderate to severe pedal edema bilaterally.  In the past chest x-rays, BNP and ultrasound did not show CHF or DVT.  You expressed reluctance to do either the studies today.  However I do want you weigh yourself  daily and if your weight continues to increase then do want to get a chest x-ray.  If you get any pain behind behind either knee then let me know and I will get repeat ultraound as we have done in the past.  Presently will treat you for dependent edema.  Make sure you elevate your legs as much as possible.  I am stopping your HCTZ and prescribing low-dose Lasix 20 mg every day.  I do want you to follow-up 1 week from today and will check your blood pressure and recheck your electrolytes.  You do have you have broken down area left lower ext. Rx keflex oral antibiotic and mupirocin pending wound culture.  Follow urologist advise on hematuria  Follow up in one week or as needed

## 2019-11-09 DIAGNOSIS — G2581 Restless legs syndrome: Secondary | ICD-10-CM | POA: Diagnosis not present

## 2019-11-09 DIAGNOSIS — M48061 Spinal stenosis, lumbar region without neurogenic claudication: Secondary | ICD-10-CM | POA: Diagnosis not present

## 2019-11-09 DIAGNOSIS — G629 Polyneuropathy, unspecified: Secondary | ICD-10-CM | POA: Diagnosis not present

## 2019-11-11 ENCOUNTER — Telehealth: Payer: Self-pay

## 2019-11-11 ENCOUNTER — Telehealth: Payer: Self-pay | Admitting: Medical

## 2019-11-11 LAB — WOUND CULTURE
MICRO NUMBER:: 10458531
SPECIMEN QUALITY:: ADEQUATE

## 2019-11-11 MED ORDER — SULFAMETHOXAZOLE-TRIMETHOPRIM 800-160 MG PO TABS
1.0000 | ORAL_TABLET | Freq: Two times a day (BID) | ORAL | 0 refills | Status: DC
Start: 2019-11-11 — End: 2019-11-23

## 2019-11-11 NOTE — Telephone Encounter (Signed)
I sent in bactrim. I just sent since wanted update as to whether or not he thought was getting better. I take it he thinks not much better so now just sent.

## 2019-11-11 NOTE — Telephone Encounter (Addendum)
Patient states the bactrim medication wasn't at the pharmacy when he went to go pick up his new script.  Just sen the rx now. Was going to send if he thought he was not getting better. So now start bactrim and stop keflex  Since he went already I take it he thought not improved.   Was waiting for feed back.   Thanks,

## 2019-11-13 ENCOUNTER — Other Ambulatory Visit: Payer: Self-pay | Admitting: Medical

## 2019-11-13 DIAGNOSIS — L089 Local infection of the skin and subcutaneous tissue, unspecified: Secondary | ICD-10-CM

## 2019-11-18 ENCOUNTER — Ambulatory Visit: Payer: Medicare Other | Admitting: Medical

## 2019-11-19 ENCOUNTER — Other Ambulatory Visit: Payer: Self-pay

## 2019-11-19 ENCOUNTER — Ambulatory Visit (INDEPENDENT_AMBULATORY_CARE_PROVIDER_SITE_OTHER): Payer: Medicare Other | Admitting: Medical

## 2019-11-19 ENCOUNTER — Encounter: Payer: Self-pay | Admitting: Medical

## 2019-11-19 ENCOUNTER — Telehealth: Payer: Self-pay | Admitting: Medical

## 2019-11-19 VITALS — BP 106/64 | HR 92 | Temp 98.3°F | Resp 16 | Ht 70.0 in | Wt 189.2 lb

## 2019-11-19 DIAGNOSIS — R944 Abnormal results of kidney function studies: Secondary | ICD-10-CM | POA: Diagnosis not present

## 2019-11-19 DIAGNOSIS — I959 Hypotension, unspecified: Secondary | ICD-10-CM

## 2019-11-19 DIAGNOSIS — R600 Localized edema: Secondary | ICD-10-CM | POA: Diagnosis not present

## 2019-11-19 LAB — CBC WITH DIFFERENTIAL/PLATELET
Basophils Absolute: 0 10*3/uL (ref 0.0–0.1)
Basophils Relative: 0.8 % (ref 0.0–3.0)
Eosinophils Absolute: 0.2 10*3/uL (ref 0.0–0.7)
Eosinophils Relative: 2.7 % (ref 0.0–5.0)
HCT: 34.5 % — ABNORMAL LOW (ref 39.0–52.0)
Hemoglobin: 11.6 g/dL — ABNORMAL LOW (ref 13.0–17.0)
Lymphocytes Relative: 22.2 % (ref 12.0–46.0)
Lymphs Abs: 1.4 10*3/uL (ref 0.7–4.0)
MCHC: 33.5 g/dL (ref 30.0–36.0)
MCV: 99.5 fl (ref 78.0–100.0)
Monocytes Absolute: 0.6 10*3/uL (ref 0.1–1.0)
Monocytes Relative: 9.4 % (ref 3.0–12.0)
Neutro Abs: 4.1 10*3/uL (ref 1.4–7.7)
Neutrophils Relative %: 64.9 % (ref 43.0–77.0)
Platelets: 221 10*3/uL (ref 150.0–400.0)
RBC: 3.47 Mil/uL — ABNORMAL LOW (ref 4.22–5.81)
RDW: 13.3 % (ref 11.5–15.5)
WBC: 6.4 10*3/uL (ref 4.0–10.5)

## 2019-11-19 LAB — COMPREHENSIVE METABOLIC PANEL
ALT: 10 U/L (ref 0–53)
AST: 16 U/L (ref 0–37)
Albumin: 4 g/dL (ref 3.5–5.2)
Alkaline Phosphatase: 64 U/L (ref 39–117)
BUN: 45 mg/dL — ABNORMAL HIGH (ref 6–23)
CO2: 30 mEq/L (ref 19–32)
Calcium: 8.8 mg/dL (ref 8.4–10.5)
Chloride: 104 mEq/L (ref 96–112)
Creatinine, Ser: 1.99 mg/dL — ABNORMAL HIGH (ref 0.40–1.50)
GFR: 31.63 mL/min — ABNORMAL LOW (ref >60.00)
Glucose, Bld: 97 mg/dL (ref 70–99)
Potassium: 4.8 mEq/L (ref 3.5–5.1)
Sodium: 140 mEq/L (ref 135–145)
Total Bilirubin: 0.4 mg/dL (ref 0.2–1.2)
Total Protein: 6.5 g/dL (ref 6.0–8.3)

## 2019-11-19 NOTE — Patient Instructions (Addendum)
After hour her labs came back showing low gfr in 30's. I called you after hours and you notified me started lasix but never stopped hctz as I had wanted you to. So decreased gfr maybe due to dehydration. So stop lasix completely can continue hctz but make sure you stay hydrated. Your bp may increase a little as well. Repeat cmp next week.   Elevate you legs at night.   For calf wound continue with topical mupirocin.  If calfs swell dramatically then need to repeat cxr. If any pain behind leg then repeat US. Both various negative in past but need to be cautious.  Recheck cmp and cbc today.  Be careful on standing before ambulating.    Stay adequate hydrated.   Follow up in 2 weeks or as needed

## 2019-11-19 NOTE — Progress Notes (Signed)
Subjective:    Patient ID: Brandon Mason, male    DOB: 05/11/1926, 86 y.o.   MRN: [id number]  HPI  Pt in for follow up on dependant edema. His legs are less swollen now. See last A/P. Again prior cxr and lower ext [place] were negative. Due to repeat testing pt expressed reluctance to repeat those studies again. On last visit switched to lasix and stopped hctz. He also did not respond to compression. No sob or wheezing. No leg pain.  Pt left lower ext opened wound has scabbed up. No dc. Pt was on on keflex and mupirocin.    Review of Systems  Constitutional: Negative for chills, fatigue and fever.  HENT: Negative for dental problem.   Respiratory: Negative for cough, chest tightness, shortness of breath and wheezing.   Cardiovascular: Negative for chest pain and palpitations.  Gastrointestinal: Negative for abdominal pain.  Genitourinary: Negative for flank pain and frequency.  Musculoskeletal: Negative for back pain.  Skin: Negative for rash.  Neurological: Negative for dizziness, weakness and light-headedness.  Psychiatric/Behavioral: Negative for behavioral problems, confusion and hallucinations. The patient is not nervous/anxious and is not hyperactive.     Past Medical History:  Diagnosis Date  . Abnormality of gait 02/09/2013  . Arthritis   . Cancer ([place])    basal cell skin cancer  . Cardiomegaly   . Chronic kidney disease    BPH  . Chronic leg pain   . Hypersomnia   . Hypertension   . Hypoxia    pulmonary  . Neuromuscular disorder (HCC)    restless leg syndrome   . Nocturia   . Prostate disorder   . Restless legs syndrome (RLS) 02/09/2013  . Restless legs syndrome with nocturnal myoclonus   . RLS (restless legs syndrome)    x 40 years  . Shortness of breath    with exertion   . Sleep apnea    uses cpap  . Stroke [facility])    minor stroke in 2003 - no lasting side effects  . UARS (upper airway resistance syndrome) 06/28/2014     Social History   Socioeconomic  History  . Marital status: Married    Spouse name: [name]  . Number of children: 5  . Years of education: 58  . Highest education level: Not on file  Occupational History  . 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 Smoking status: Never Smoker  . Smokeless tobacco: Never Used  Substance and Sexual Activity  . Alcohol use: Yes    Alcohol/week: 2.0 standard drinks    Types: 2 Cans of beer per week    Comment: weekly  . Drug use: No  . Sexual activity: Not on file  Other Topics Concern  . Not on file  Social History Narrative   Patient lives at home with his wife [name].   Patient is retired.    Patient has 5 children.   Patient has a high school education.   Patient is right-handed.   Patient drinks very little caffeine.   Social Determinants of Health   Financial Resource Strain:   . Difficulty of Paying Living Expenses:   Food Insecurity:   . Worried About Charity fundraiser in the Last Year:   . Arboriculturist in the Last Year:   Transportation Needs:   . Film/video editor (Medical):   Marland Kitchen Lack of Transportation (Non-Medical):   Physical Activity:   .  Days of Exercise per Week:   . Minutes of Exercise per Session:   Stress:   . Feeling of Stress :   Social Connections:   . Frequency of Communication with Friends and Family:   . Frequency of Social Gatherings with Friends and Family:   . Attends Religious Services:   . Active Member of Clubs or Organizations:   . Attends Archivist Meetings:   Marland Kitchen Marital Status:   Intimate Partner Violence:   . Fear of Current or Ex-Partner:   . Emotionally Abused:   Marland Kitchen Physically Abused:   . Sexually Abused:     Past Surgical History:  Procedure Laterality Date  . ANTERIOR CERVICAL DECOMP/DISCECTOMY FUSION N/A 03/29/2016   Procedure: ANTERIOR CERVICAL DECOMPRESSION/DISCECTOMY FUSION CERVICAL THREE- CERVICAL FOUR;  Surgeon: Ashok Pall, MD;  Location: [place] [facility];  Service:  Neurosurgery;  Laterality: N/A;  . BACK SURGERY  2003   L4-5  . CATARACT EXTRACTION Bilateral   . HERNIA REPAIR     inguinal hernia  . TRANSURETHRAL PROSTATECTOMY WITH GYRUS INSTRUMENTS  04/28/2012   Procedure: TRANSURETHRAL PROSTATECTOMY WITH GYRUS INSTRUMENTS;  Surgeon: Fredricka Bonine, MD;  Location: [facility];  Service: Urology;  Laterality: N/A;  . TRANSURETHRAL RESECTION OF PROSTATE  03/24/2012   Procedure: TRANSURETHRAL RESECTION OF THE PROSTATE (TURP);  Surgeon: Fredricka Bonine, MD;  Location: [facility];  Service: Urology;  Laterality: N/A;  with gyrus ,Greenlight PVP laser of Prostate    Family History  Problem Relation Age of Onset  . Restless legs syndrome Mother   . Restless legs syndrome Daughter   . Esophageal cancer Neg Hx   . Colon cancer Neg Hx   . Pancreatic cancer Neg Hx   . Stomach cancer Neg Hx     No Known Allergies  Current Outpatient Medications on File Prior to Visit  Medication Sig Dispense Refill  . amLODipine (NORVASC) 10 MG tablet Take 1 tablet by mouth once daily 90 tablet 0  . cephALEXin (KEFLEX) 500 MG capsule Take 1 capsule (500 mg total) by mouth 2 (two) times daily. 20 capsule 0  . finasteride (PROSCAR) 5 MG tablet Take 5 mg by mouth daily.    . furosemide (LASIX) 20 MG tablet Take 1 tablet (20 mg total) by mouth daily. 30 tablet 0  . lisinopril (ZESTRIL) 20 MG tablet Take 1 tablet by mouth once daily 90 tablet 0  . meloxicam (MOBIC) 7.5 MG tablet TAKE 1 TO 2 TABLETS BY MOUTH ONCE DAILY 16 tablet 0  . mupirocin ointment (BACTROBAN) 2 % APPLY TO AFFECTED AREA TWICE DAILY 22 g 0  . nystatin cream (MYCOSTATIN) APPLY  CREAM TOPICALLY TO AFFECTED AREA TWICE DAILY 30 g 0  . pregabalin (LYRICA) 150 MG capsule Take 150 mg by mouth 2 (two) times daily.    Marland Kitchen rOPINIRole (REQUIP) 4 MG tablet TAKE 1 TABLET BY MOUTH AT BEDTIME 30 tablet 2  . sulfamethoxazole-trimethoprim (BACTRIM DS) 800-160 MG tablet Take 1 tablet by mouth 2 (two) times daily. 20  tablet 0  . traMADol (ULTRAM) 50 MG tablet TAKE 1 TABLET BY MOUTH EVERY 8 HOURS AS NEEDED FOR PAIN 90 tablet 0   No current facility-administered medications on file prior to visit.    BP 106/64 (BP Location: Right Arm, Patient Position: Sitting, Cuff Size: Normal)   Pulse 92   Temp 98.3 F (36.8 C) (Temporal)   Resp 16   Ht 5\' 10"  (1.778 m)   Wt 189 lb 3.2 oz (85.8  kg)   SpO2 94%   BMI 27.15 kg/m       Objective:   Physical Exam  General Mental Status- Alert. General Appearance- Not in acute distress.   Skin General: Color- Normal Color. Moisture- Normal Moisture.  Neck Carotid Arteries- Normal color. Moisture- Normal Moisture. No carotid bruits. No JVD.  Chest and Lung Exam Auscultation: Breath Sounds:-Normal.  Cardiovascular Auscultation:Rythm- Regular. Murmurs & Other Heart Sounds:Auscultation of the heart reveals- No Murmurs.  Abdomen Inspection:-Inspeection Normal. Palpation/Percussion:Note:No mass. Palpation and Percussion of the abdomen reveal- Non Tender, Non Distended + BS, no rebound or guarding.    Neurologic Cranial Nerve exam:- CN III-XII intact(No nystagmus), symmetric smile. Strength:- 5/5 equal and symmetric strength both upper and lower extremities.  Lower ext- bilateral pedal edema present. Left side worse than rt but both sides are better.  Left lower ext- small prior open wound is now healed with scab present.      Assessment & Plan:  Marland Kitchen After hour her labs came back showing low gfr in 30's. I called you after hours and you notified me started lasix but never stopped hctz as I had wanted you to. So decreased gfr maybe due to dehydration. So stop lasix completely can continue hctz but make sure you stay hydrated. Your bp may increase a little as well. Repeat cmp next week.   Elevate you legs at night.   For calf wound continue with topical mupirocin.  If calfs swell dramatically then need to repeat cxr. If any pain behind leg then  repeat US. Both various negative in past but need to be cautious.  Recheck cmp and cbc today.  Be careful on standing before ambulating.    Stay adequate hydrated.   Follow up in 2 weeks or as needed   Mackie Pai, [name]   Time spent with patient today was 30  minutes which consisted of chart review, discussing diagnosis, work up, treatment review of labs toay and documentation.

## 2019-11-19 NOTE — Telephone Encounter (Signed)
Future cmp placed. 

## 2019-11-21 ENCOUNTER — Other Ambulatory Visit: Payer: Self-pay | Admitting: Medical

## 2019-11-23 NOTE — Telephone Encounter (Signed)
I refilled pt requip. I actually did not mean to prescribe bactrrim. It was loaded and I tried to cancel out the rx. On last visit just advised to continue the topical mupirocin.  If he feels like scab area left lower extremity region is red, swollen or tender then can use bactrim.  Call pt and notify him please.

## 2019-11-26 NOTE — Telephone Encounter (Signed)
Called informed of PCP instructions regarding medication. The patient verbalized understanding.

## 2019-11-29 ENCOUNTER — Encounter: Payer: Self-pay | Admitting: Internal Medicine

## 2019-11-30 ENCOUNTER — Ambulatory Visit (INDEPENDENT_AMBULATORY_CARE_PROVIDER_SITE_OTHER): Payer: Medicare Other | Admitting: Internal Medicine

## 2019-11-30 ENCOUNTER — Other Ambulatory Visit: Payer: Self-pay

## 2019-11-30 ENCOUNTER — Encounter: Payer: Self-pay | Admitting: Internal Medicine

## 2019-11-30 VITALS — BP 112/60 | HR 60 | Temp 97.1°F | Ht 68.0 in | Wt 192.9 lb

## 2019-11-30 DIAGNOSIS — G2581 Restless legs syndrome: Secondary | ICD-10-CM | POA: Diagnosis not present

## 2019-11-30 DIAGNOSIS — G4733 Obstructive sleep apnea (adult) (pediatric): Secondary | ICD-10-CM | POA: Diagnosis not present

## 2019-11-30 NOTE — Assessment & Plan Note (Signed)
He does benefit from CPAP and has been compliant. We will update his sleep study now, 6 years after the original.  Plan- HST. [facility] to reduce pressure range to 10-15 for his complaint of "blowing too hard".

## 2019-11-30 NOTE — Assessment & Plan Note (Signed)
He describes an ongoing problem with fair control by Requip, but he blames Requip for daytime sleepiness. We will reconsider options after updating sleep study.

## 2019-11-30 NOTE — Patient Instructions (Signed)
Order- we will contact [facility] for your current CPAP settings and download Then I anticipate having your pressure set lower for comfort.  Order- schedule Home Sleep Test    Dx OSA  Please call me about 2 weeks after your sleep test, to see if results and recommendations are ready yet. If appropriate, I may be able to replace your old CPAP machine or make other suggestions.

## 2019-11-30 NOTE — Progress Notes (Signed)
11/30/19- 81yom married, retired Interior and spatial designer, never smoker for sleep evaluation. NPSG [facility]/ [facility]. 2015 AHI 4.1/ hr, RDI 24.2/ hr CPAP auto 14-18/ [employer] Download compliance 93%, AHI 1/ hr. Epworth score 18 Body weight today 192 lbs Was being followed at [facility]- too far to drive from [place].  Medical problem list includes Upper Airway Resistance Syndrome, Herniated Cervical Disc, BPH, Restless Legs He has been using same CPAP machine since original sleep study. Says his wife really complains of his snoring if he doesn't wear it. Complains of too much pressure. Restless Legs- "walks the floor in the evening while wife trying to watch TV.  Requip helps legs, causes some drowsiness. Denies drowsy driving.  Up 3 times during the night. No other sleep meds. Hx DVT w/o PE- took anticoagulant in the past. Hx CVA.  Prior to Admission medications   Medication Sig Start Date End Date Taking? Authorizing Provider  amLODipine (NORVASC) 10 MG tablet Take 1 tablet by mouth once daily 09/06/19  Yes Saguier, Percell Miller, [name]  cephALEXin (KEFLEX) 500 MG capsule Take 1 capsule (500 mg total) by mouth 2 (two) times daily. 11/08/19  Yes Saguier, Percell Miller, [name]  finasteride (PROSCAR) 5 MG tablet Take 5 mg by mouth daily. 11/12/19  Yes [provider]  furosemide (LASIX) 20 MG tablet Take 1 tablet (20 mg total) by mouth daily. 11/08/19  Yes Saguier, Percell Miller, [name]  lisinopril (ZESTRIL) 20 MG tablet Take 1 tablet by mouth once daily 11/04/19  Yes Saguier, Percell Miller, [name]  meloxicam (MOBIC) 7.5 MG tablet TAKE 1 TO 2 TABLETS BY MOUTH ONCE DAILY 01/29/19  Yes Saguier, Percell Miller, [name]  mupirocin ointment (BACTROBAN) 2 % APPLY TO AFFECTED AREA TWICE DAILY 11/15/19  Yes Saguier, Percell Miller, [name]  nystatin cream (MYCOSTATIN) APPLY  CREAM TOPICALLY TO AFFECTED AREA TWICE DAILY 11/08/19  Yes Saguier, Percell Miller, [name]  pregabalin (LYRICA) 150 MG capsule Take 150 mg by mouth 2 (two) times daily. 11/09/19  Yes  [provider]  rOPINIRole (REQUIP) 4 MG tablet TAKE 1 TABLET BY MOUTH AT BEDTIME 11/23/19  Yes Saguier, Percell Miller, [name]  sulfamethoxazole-trimethoprim (BACTRIM DS) 800-160 MG tablet Take 1 tablet by mouth twice daily 11/23/19  Yes Saguier, Percell Miller, [name]  traMADol (ULTRAM) 50 MG tablet TAKE 1 TABLET BY MOUTH EVERY 8 HOURS AS NEEDED FOR PAIN 11/05/19  Yes Saguier, Iris Pert   Past Medical History:  Diagnosis Date  . Abnormality of gait 02/09/2013  . Arthritis   . Cancer ([place])    basal cell skin cancer  . Cardiomegaly   . Chronic kidney disease    BPH  . Chronic leg pain   . Hypersomnia   . Hypertension   . Hypoxia    pulmonary  . Neuromuscular disorder (HCC)    restless leg syndrome   . Nocturia   . Prostate disorder   . Restless legs syndrome (RLS) 02/09/2013  . Restless legs syndrome with nocturnal myoclonus   . RLS (restless legs syndrome)    x 40 years  . Shortness of breath    with exertion   . Sleep apnea    uses cpap  . Stroke [facility])    minor stroke in 2003 - no lasting side effects  . UARS (upper airway resistance syndrome) 06/28/2014   Past Surgical History:  Procedure Laterality Date  . ANTERIOR CERVICAL DECOMP/DISCECTOMY FUSION N/A 03/29/2016   Procedure: ANTERIOR CERVICAL DECOMPRESSION/DISCECTOMY FUSION CERVICAL THREE- CERVICAL FOUR;  Surgeon: Ashok Pall, MD;  Location: [place] [facility];  Service: Neurosurgery;  Laterality: N/A;  . BACK SURGERY  2003   L4-5  . CATARACT EXTRACTION Bilateral   . HERNIA REPAIR     inguinal hernia  . TRANSURETHRAL PROSTATECTOMY WITH GYRUS INSTRUMENTS  04/28/2012   Procedure: TRANSURETHRAL PROSTATECTOMY WITH GYRUS INSTRUMENTS;  Surgeon: Fredricka Bonine, MD;  Location: [facility];  Service: Urology;  Laterality: N/A;  . TRANSURETHRAL RESECTION OF PROSTATE  03/24/2012   Procedure: TRANSURETHRAL RESECTION OF THE PROSTATE (TURP);  Surgeon: Fredricka Bonine, MD;  Location: [facility];  Service: Urology;  Laterality: N/A;  with  gyrus ,Greenlight PVP laser of Prostate   Family History  Problem Relation Age of Onset  . Restless legs syndrome Mother   . Restless legs syndrome Daughter   . Esophageal cancer Neg Hx   . Colon cancer Neg Hx   . Pancreatic cancer Neg Hx   . Stomach cancer Neg Hx    Social History   Socioeconomic History  . Marital status: Married    Spouse name: [name]  . Number of children: 5  . Years of education: 51  . Highest education level: Not on file  Occupational History  . 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 Smoking status: Never Smoker  . Smokeless tobacco: Never Used  Substance and Sexual Activity  . Alcohol use: Yes    Alcohol/week: 2.0 standard drinks    Types: 2 Cans of beer per week    Comment: weekly  . Drug use: No  . Sexual activity: Not on file  Other Topics Concern  . Not on file  Social History Narrative   Patient lives at home with his wife [name].   Patient is retired.    Patient has 5 children.   Patient has a high school education.   Patient is right-handed.   Patient drinks very little caffeine.   Social Determinants of Health   Financial Resource Strain:   . Difficulty of Paying Living Expenses:   Food Insecurity:   . Worried About Charity fundraiser in the Last Year:   . Arboriculturist in the Last Year:   Transportation Needs:   . Film/video editor (Medical):   Marland Kitchen Lack of Transportation (Non-Medical):   Physical Activity:   . Days of Exercise per Week:   . Minutes of Exercise per Session:   Stress:   . Feeling of Stress :   Social Connections:   . Frequency of Communication with Friends and Family:   . Frequency of Social Gatherings with Friends and Family:   . Attends Religious Services:   . Active Member of Clubs or Organizations:   . Attends Archivist Meetings:   Marland Kitchen Marital Status:   Intimate Partner Violence:   . Fear of Current or Ex-Partner:   . Emotionally Abused:   Marland Kitchen  Physically Abused:   . Sexually Abused:    ROS-see HPI   + = positive Constitutional:    weight loss, night sweats, fevers, chills, fatigue, lassitude. HEENT:    headaches, +difficulty swallowing, tooth/dental problems, sore throat,       Sneezing,+ itching, ear ache, nasal congestion, post nasal drip, snoring CV:    chest pain, orthopnea, PND, +swelling in lower extremities, anasarca,                                  dizziness, palpitations Resp:  +  shortness of breath with exertion or at rest.                productive cough,   non-productive cough, coughing up of blood.              change in color of mucus.  wheezing.   Skin:    rash or lesions. GI:  + heartburn, indigestion, abdominal pain, nausea, vomiting, diarrhea,                 change in bowel habits, loss of appetite GU: dysuria, change in color of urine, no urgency or frequency.   flank pain. MS:  + joint pain, stiffness, decreased range of motion, back pain. Neuro-     nothing unusual Psych:  change in mood or affect.  depression or +anxiety.   memory loss.  OBJ- Physical Exam General- Alert, Oriented, Affect-appropriate, Distress- none acute Skin- rash-none, lesions- none, excoriation- none Lymphadenopathy- none Head- atraumatic            Eyes- Gross vision intact, PERRLA, conjunctivae and secretions clear            Ears- Hearing, canals-normal            Nose- Clear, no-Septal dev, mucus, polyps, erosion, perforation             Throat- Mallampati II , mucosa clear , drainage- none, tonsils- atrophic Neck- flexible , trachea midline, no stridor , thyroid nl, carotid no bruit Chest - symmetrical excursion , unlabored           Heart/CV- RRR , no murmur , no gallop  , no rub, nl s1 s2                           - JVD- none , edema- none, stasis changes- none, varices- none           Lung- clear to P&A, wheeze- none, cough- none , dullness-none, rub- none           Chest wall-  Abd-  Br/ Gen/ Rectal- Not done, not  indicated Extrem- + heavy legs, + superficial varices Neuro- grossly intact to observation

## 2019-12-05 ENCOUNTER — Other Ambulatory Visit: Payer: Self-pay | Admitting: Medical

## 2019-12-07 ENCOUNTER — Other Ambulatory Visit: Payer: Self-pay | Admitting: Medical

## 2019-12-07 DIAGNOSIS — L089 Local infection of the skin and subcutaneous tissue, unspecified: Secondary | ICD-10-CM

## 2019-12-08 NOTE — Telephone Encounter (Signed)
Refill request: Tramadol 50mg   Last refill: 11/05/2019 #90, no refills Last OV: 11/19/2019 Next OV: None UDS: 07/27/19 CSC: 07/27/19

## 2019-12-09 ENCOUNTER — Telehealth: Payer: Self-pay

## 2019-12-09 NOTE — Telephone Encounter (Signed)
Rx refill sent to pt pharmacy 

## 2019-12-09 NOTE — Telephone Encounter (Signed)
rx tramadol sent to pt pharmacy.

## 2019-12-09 NOTE — Telephone Encounter (Signed)
Requesting: tramadol Contract:08/23/19 UDS:07/27/19 Last Visit:11/19/19 Next Visit:n/a Last Refill:11/05/19  Please Advise

## 2019-12-23 ENCOUNTER — Other Ambulatory Visit: Payer: Self-pay

## 2019-12-23 ENCOUNTER — Other Ambulatory Visit: Payer: Self-pay | Admitting: Medical

## 2019-12-23 MED ORDER — FINASTERIDE 5 MG PO TABS: 5.0000 mg | ORAL_TABLET | Freq: Every day | ORAL | 2 refills | Status: DC

## 2019-12-30 ENCOUNTER — Other Ambulatory Visit: Payer: Self-pay | Admitting: Medical

## 2020-01-05 ENCOUNTER — Ambulatory Visit: Payer: Medicare Other

## 2020-01-05 ENCOUNTER — Other Ambulatory Visit: Payer: Self-pay

## 2020-01-05 DIAGNOSIS — G4733 Obstructive sleep apnea (adult) (pediatric): Secondary | ICD-10-CM

## 2020-01-06 ENCOUNTER — Telehealth: Payer: Self-pay | Admitting: Medical

## 2020-01-06 ENCOUNTER — Other Ambulatory Visit: Payer: Self-pay | Admitting: Medical

## 2020-01-06 NOTE — Telephone Encounter (Addendum)
Requesting: tramaol Contract:08/23/19 UDS:07/27/19 Last Visit: 11/08/19 Next Visit:n/a Last Refill:  Please Advise   I accidentally printed tramadol prescription twice. Will you shred the print rx's. I will send electronically.

## 2020-01-07 ENCOUNTER — Telehealth: Payer: Self-pay | Admitting: Medical

## 2020-01-07 MED ORDER — TRAMADOL HCL 50 MG PO TABS: 50.0000 mg | ORAL_TABLET | Freq: Three times a day (TID) | ORAL | 0 refills | Status: DC | PRN

## 2020-01-07 NOTE — Addendum Note (Signed)
Addended by: Anabel Halon on: 01/07/2020 09:36 AM   Modules accepted: Orders

## 2020-01-07 NOTE — Telephone Encounter (Signed)
Pt has spinal stenosis, neuropathy and RLS. He is on requip and tramadol in the past. Neurologist recently added lyrica. On that office note no recommendation to stop any meds.   Wanted to send for controlled med review.  Up to date on contract and uds.

## 2020-01-10 DIAGNOSIS — G4733 Obstructive sleep apnea (adult) (pediatric): Secondary | ICD-10-CM | POA: Diagnosis not present

## 2020-01-10 NOTE — Telephone Encounter (Signed)
Reviewed and agree  Ann Held, DO

## 2020-02-01 ENCOUNTER — Telehealth: Payer: Self-pay | Admitting: Internal Medicine

## 2020-02-01 NOTE — Telephone Encounter (Signed)
According to Dr. Janee Morn last note patient was on auto CPAP 14 to 18 cm of H2O.  Since he feels like his pressure is too much can decrease auto CPAP 8 to 18cm H2O  Please make sure that his mask is not leaking as sometimes this can cause excessive pressures Please get in touch with DME company to verify current settings.  Also please request a CPAP download please. Will route to Dr. Annamaria Boots for his information  Please contact office for sooner follow up if symptoms do not improve or worsen or seek emergency care

## 2020-02-01 NOTE — Telephone Encounter (Signed)
Spoke with pt who states he feels that pressure is to high on his CPAP and that air is leaking around his mask. Pt states this has been going of for about 1 week. Pt's DME is [facility].Pt is not in air[place], left message with Brad New at [employer] to obtain compliance report. In the meantime Tammy please advise on pt's message.

## 2020-02-02 ENCOUNTER — Telehealth: Payer: Self-pay | Admitting: Medical

## 2020-02-02 NOTE — Telephone Encounter (Signed)
Formed faxed

## 2020-02-02 NOTE — Telephone Encounter (Signed)
Will fax over [facility] cpap suppy form to Dr. Baird Lyons Pulmonologist office. Pt was seen by them 11-30-2019.  Defer to them on filling out form.

## 2020-02-02 NOTE — Telephone Encounter (Signed)
Pt is enrolled in [place]. Download has been printed, per the download his pressure is set at Auto 10-15cm. Download has been placed in TP's look at.  ATC pt, there was no answer and no option to leave a message. Will try back.

## 2020-02-03 NOTE — Telephone Encounter (Signed)
ATC pt, there was no answer and no option to leave a message. Will try back. 

## 2020-02-04 NOTE — Telephone Encounter (Signed)
ATC pt, there was no answer and no option to leave a message. We have attempted to contact pt several times with no success or call back from pt. Per triage protocol, message will be closed.  

## 2020-02-06 ENCOUNTER — Other Ambulatory Visit: Payer: Self-pay | Admitting: Medical

## 2020-02-07 ENCOUNTER — Other Ambulatory Visit: Payer: Self-pay | Admitting: Medical

## 2020-02-07 ENCOUNTER — Telehealth: Payer: Self-pay | Admitting: Internal Medicine

## 2020-02-07 DIAGNOSIS — G4734 Idiopathic sleep related nonobstructive alveolar hypoventilation: Secondary | ICD-10-CM

## 2020-02-07 DIAGNOSIS — G4733 Obstructive sleep apnea (adult) (pediatric): Secondary | ICD-10-CM

## 2020-02-07 NOTE — Telephone Encounter (Signed)
Tried calling the pt and there was no answer and no VM to leave msg, [facility]

## 2020-02-07 NOTE — Telephone Encounter (Addendum)
Last office visit- 11-30-2019 Last refill- 01-07-2020 Next appointment- not scheduled UDS- 07-27-2019

## 2020-02-08 NOTE — Telephone Encounter (Signed)
Patient called back referencing medication refill for   traMADol (ULTRAM) 50 MG tablet [[id number]

## 2020-02-08 NOTE — Telephone Encounter (Signed)
ATC pt, there was no answer and no option to leave a message. Will try back. 

## 2020-02-09 NOTE — Telephone Encounter (Addendum)
Medication refill sent to [facility]   I just saw and refilled his tramadol.  uds and controlled me up to date.  Make sure refill request  to [facility] canceled.  If by chance dupilicate sent then call pharmacy and explain only to fill one etc.

## 2020-02-09 NOTE — Telephone Encounter (Signed)
Pt is calling about medication tradol.  Please Advise

## 2020-02-09 NOTE — Telephone Encounter (Signed)
Patient is calling about a prescription refill, patient states that he is out of medication. Patient states up all night. Please send prescription in.

## 2020-02-10 NOTE — Telephone Encounter (Signed)
Called and spoke to pt. Informed him of the recs per TP (from previous phone note from 8.3.21), and placed order for CPAP pressure change. Pt states he is not having any leaks when he falls asleep but around 4 hours later when the pressure increases then he feels he has leaks. Pt states he got a new mask and feels this issue might be resolved.   Will forward to TP and Dr. Annamaria Boots.    Per TP's recs from phone note 8.3.21: According to Dr. Janee Morn last note patient was on auto CPAP 14 to 18 cm of H2O.  Since he feels like his pressure is too much can decrease auto CPAP 8 to 18cm H2O  Please make sure that his mask is not leaking as sometimes this can cause excessive pressures Please get in touch with DME company to verify current settings.  Also please request a CPAP download please. Will route to Dr. Annamaria Boots for his information  Please contact office for sooner follow up if symptoms do not improve or worsen or seek emergency care

## 2020-02-11 ENCOUNTER — Other Ambulatory Visit: Payer: Self-pay | Admitting: Internal Medicine

## 2020-02-11 DIAGNOSIS — G4733 Obstructive sleep apnea (adult) (pediatric): Secondary | ICD-10-CM

## 2020-02-15 DIAGNOSIS — C44729 Squamous cell carcinoma of skin of left lower limb, including hip: Secondary | ICD-10-CM | POA: Diagnosis not present

## 2020-02-15 DIAGNOSIS — I8310 Varicose veins of unspecified lower extremity with inflammation: Secondary | ICD-10-CM | POA: Diagnosis not present

## 2020-02-20 ENCOUNTER — Other Ambulatory Visit: Payer: Self-pay | Admitting: Medical

## 2020-02-21 NOTE — Telephone Encounter (Signed)
Message sent to Dr. Annamaria Boots to please advise.

## 2020-03-01 ENCOUNTER — Ambulatory Visit: Payer: Medicare Other | Admitting: Internal Medicine

## 2020-03-01 NOTE — Telephone Encounter (Signed)
Dr. Young - please advise. 

## 2020-03-01 NOTE — Progress Notes (Deleted)
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 [facility]. 2015 AHI 4.1/ hr, RDI 24.2/ hr CPAP auto 14-18/ [employer] Download compliance 93%, AHI 1/ hr. Epworth score 18 Body weight today 192 lbs Was being followed at [facility]- too far to drive from [place].  Medical problem list includes Upper Airway Resistance Syndrome, Herniated Cervical Disc, BPH, Restless Legs He has been using same CPAP machine since original sleep study. Says his wife really complains of his snoring if he doesn't wear it. Complains of too much pressure. Restless Legs- "walks the floor in the evening while wife trying to watch TV.  Requip helps legs, causes some drowsiness. Denies drowsy driving.  Up 3 times during the night. No other sleep meds. Hx DVT w/o PE- took anticoagulant in the past. Hx CVA.  03/01/20- 34yom married, retired Interior and spatial designer, never smoker, followed for OSA, complicated by Upper Airway Resistance Syndrome, Herniated Cervical Disc, BPH, Restless Legs, hx DVT, hx CVA,  [facility]. 2015 AHI 4.1/ hr, RDI 24.2/ hr HST 01/05/20- AHI 3/ hr, desaturation to 75% with average 87% ([place] Hypoxemia), body weight 192 lbs    ROS-see HPI   + = positive Constitutional:    weight loss, night sweats, fevers, chills, fatigue, lassitude. HEENT:    headaches, +difficulty swallowing, tooth/dental problems, sore throat,       Sneezing,+ itching, ear ache, nasal congestion, post nasal drip, snoring CV:    chest pain, orthopnea, PND, +swelling in lower extremities, anasarca,                                  dizziness, palpitations Resp:  + shortness of breath with exertion or at rest.                productive cough,   non-productive cough, coughing up of blood.              change in color of mucus.  wheezing.   Skin:    rash or lesions. GI:  + heartburn, indigestion, abdominal pain, nausea, vomiting, diarrhea,                  change in bowel habits, loss of appetite GU: dysuria, change in color of urine, no urgency or frequency.   flank pain. MS:  + joint pain, stiffness, decreased range of motion, back pain. Neuro-     nothing unusual Psych:  change in mood or affect.  depression or +anxiety.   memory loss.  OBJ- Physical Exam General- Alert, Oriented, Affect-appropriate, Distress- none acute Skin- rash-none, lesions- none, excoriation- none Lymphadenopathy- none Head- atraumatic            Eyes- Gross vision intact, PERRLA, conjunctivae and secretions clear            Ears- Hearing, canals-normal            Nose- Clear, no-Septal dev, mucus, polyps, erosion, perforation             Throat- Mallampati II , mucosa clear , drainage- none, tonsils- atrophic Neck- flexible , trachea midline, no stridor , thyroid nl, carotid no bruit Chest - symmetrical excursion , unlabored           Heart/CV- RRR , no murmur , no gallop  , no rub, nl s1 s2                           -  JVD- none , edema- none, stasis changes- none, varices- none           Lung- clear to P&A, wheeze- none, cough- none , dullness-none, rub- none           Chest wall-  Abd-  Br/ Gen/ Rectal- Not done, not indicated Extrem- + heavy legs, + superficial varices Neuro- grossly intact to observation

## 2020-03-02 NOTE — Telephone Encounter (Signed)
The home sleep test did not show significant sleep apnea. The apnea score was 3 per hour, which is within normal limits. CPAP is not needed for this. What we did find, was that the blood oxygen level was too low in sleep, staying between 75 % and 87%. The treatment for this is to replace CPAP with  home oxygen to wear at night..  I recommend we order his DME to dc CPAP and start O2 2L for sleep    dx Nocturnal Hypoxemia  Please make return office appointment in 3 months

## 2020-03-03 NOTE — Telephone Encounter (Signed)
Spoke with patient regarding his sleep study test. Advised patient  Per Dr.Young The home sleep test did not show significant sleep apnea. The apnea score was 3 per hour, which is within normal limits. CPAP is not needed for this. What we did find, was that the blood oxygen level was too low in sleep, staying between 75 % and 87%. The treatment for this is to replace CPAP with  home oxygen to wear at night..  I recommend we order his DME to dc CPAP and start O2 2L for sleep    dx Nocturnal Hypoxemia  Patient's voice was understanding.

## 2020-03-04 ENCOUNTER — Other Ambulatory Visit: Payer: Self-pay | Admitting: Medical

## 2020-03-07 NOTE — Telephone Encounter (Signed)
Please direct this to Brandon Mason at [place]. He does not have sleep apnea per the sleep studies I have found. I think the rule on O2 is that he can't be qualified for supplemental O2 with a diagnosis of Sleep Apnea, from a home study. An overnight oximetry is basically the same thing as the oxygen saturation recorded with an HST. An unattended overnight oximetry would be accepted to qualify him for nocturnal oxygen. Since we are not diagnosing OSA, just nocturnal hypoxemia, why can't we use the oximetry result from the HST??

## 2020-03-07 NOTE — Telephone Encounter (Signed)
Please see the response from the DME, [facility], copied below:  Miquel Dunn sent to Miquel Dunn; Kipp Brood, Rachell Cipro, [place]; Kem Kays [place],   Please see note from processor:   I found the Sleep Study it was a home based Sleep Study per the O2 guidelines it states that O2 can not be qualified off a home based sleep study it must be facility based. The Sleep Study was done in July. Please notify the referral source the pt does not qualify without a qualifying facility based sleep study that shows the complete raw data how long the pt O2 sats were below 88 the one we have only has a percent and it does not show the complete raw data. Pt will need a current detailed Face to face note as to why they need O2 not just a small statement.   Once this is received, we will be able to continue to process this order.  Thank you!

## 2020-03-07 NOTE — Telephone Encounter (Signed)
Routing to Dr. Annamaria Boots for him to review. Dr. Annamaria Boots, please advise on message received from [employer].

## 2020-03-08 ENCOUNTER — Telehealth: Payer: Self-pay | Admitting: Medical

## 2020-03-08 ENCOUNTER — Telehealth: Payer: Self-pay

## 2020-03-08 NOTE — Telephone Encounter (Signed)
Medication: traMADol (ULTRAM) 50 MG tablet [[id number]]      Has the patient contacted their pharmacy?  (If no, request that the patient contact the pharmacy for the refill.) (If yes, when and what did the pharmacy advise?)     Preferred Pharmacy (with phone number or street name): [facility], [place] - [address]  [address] [place] [address]  Phone:  [phone] Fax:  [phone]      Agent: Please be advised that RX refills may take up to 3 business days. We ask that you follow-up with your pharmacy.

## 2020-03-08 NOTE — Telephone Encounter (Signed)
Pt called stating he needs "medication for swollen legs."  He stated he is not coming into the office for a visit d/t the virus that is everywhere.  I asked how long the swelling in his legs has been going on and he said for 4 months now... since the last time he saw PCP.  He does not know the name of the medication he needs a refill on.

## 2020-03-09 ENCOUNTER — Telehealth: Payer: Self-pay | Admitting: Internal Medicine

## 2020-03-09 MED ORDER — TRAMADOL HCL 50 MG PO TABS: 50.0000 mg | ORAL_TABLET | Freq: Three times a day (TID) | ORAL | 2 refills | Status: DC | PRN

## 2020-03-09 NOTE — Telephone Encounter (Signed)
Return call.Brandon Mason °

## 2020-03-09 NOTE — Telephone Encounter (Signed)
Melissa is with [employer], not [employer].  See encounter from 02/07/20 as this is a duplicate to that message.

## 2020-03-09 NOTE — Telephone Encounter (Signed)
Attempted to call Brad New with [employer] but unable to reach. Left message for him to return call.

## 2020-03-09 NOTE — Telephone Encounter (Signed)
Called and spoke with pt letting him know the info stated by [employer] and stated to him that we needed to get him scheduled for an appt so we then can do what is necessary per Medicare guidelines to get him the O2. Pt verbalized understanding. appt scheduled for pt tomorrow 03/10/20 with CY as he said it was okay to use RNA slot. Nothing further needed.

## 2020-03-09 NOTE — Telephone Encounter (Signed)
Patient calling in reference to medication refill.

## 2020-03-09 NOTE — Telephone Encounter (Signed)
His appointment is approaching 5 months ago.. he does have history of pedal edema and he may be asking for refill of diuretic. Not sure. But I had asked him to follow up 2 weeks after last visit. Looks like did not follow up.  .  Also if I prescribe more diuretic I need to know kidney function an electrolyte values. His kidney function is low. Just prescribing and not checking labs at this point risky.   Diurteic can effect kidney function and potassium. As pt age kidney function gradually decline naturally.  On AVS last not want him to follow up would have checked labs on that visit.  I did refill his tramadol. But he is due for controlled med visit which is every 4 months anyway for me to continue to prescribe   Understand concerns about virus. You could look for appoitment at 8 am or 1 pm. He would be first appointment thereby put back in room and then go to lab after visit with me. Ask him to show up 10 minute before appointment time/not late.

## 2020-03-09 NOTE — Telephone Encounter (Signed)
To get insurance to pay for oxygen, I can see him in a held spot to establish a dx of nocturnal hypoxemia, then we can order new overnight oximetry.  If he doesn't wan to come in, then we can ask Melissa to talk with him about private pay for O2 2L for sleep.

## 2020-03-09 NOTE — Telephone Encounter (Signed)
Patient states he is out of medication. He would like a phone call back

## 2020-03-09 NOTE — Telephone Encounter (Signed)
Melissa with [employer] returned call. Called Melissa at [phone] in regards to message stated by Leory Plowman and also in regards to the message from [facility]. Per Melissa, even if HST could be used, the test is too old as sats need to be within 30 days. Pt's OV is also too old as it is greater than 30 days.  If CY is going to rule out OSA, medicare requires a chronic respiratory diagnosis to cover oxygen. Melissa stated that there is no chronic respiratory diagnosis that is showing for pt.  There needs to be a reason for the need to have O2.  If there is a chronic diagnosis that could be used, pt will need to come in for an OV and then an ONO will need to be ordered.  Melissa also stated if there is not a chronic diagnosis that could possibly be used, they could contact pt to see if he would like to private pay for the O2 especially if he is wanting to use the oxygen for comfort use.  Dr. Annamaria Boots, please advise on all this and let us know what you recommend.

## 2020-03-09 NOTE — Telephone Encounter (Signed)
Refill of tramadol sent to pt pharmacy.

## 2020-03-10 ENCOUNTER — Encounter: Payer: Self-pay | Admitting: Internal Medicine

## 2020-03-10 ENCOUNTER — Other Ambulatory Visit: Payer: Self-pay

## 2020-03-10 ENCOUNTER — Ambulatory Visit (INDEPENDENT_AMBULATORY_CARE_PROVIDER_SITE_OTHER): Payer: Medicare Other | Admitting: Internal Medicine

## 2020-03-10 VITALS — BP 120/60 | HR 60 | Temp 97.5°F | Ht 69.0 in | Wt 194.6 lb

## 2020-03-10 DIAGNOSIS — R609 Edema, unspecified: Secondary | ICD-10-CM

## 2020-03-10 DIAGNOSIS — G4734 Idiopathic sleep related nonobstructive alveolar hypoventilation: Secondary | ICD-10-CM | POA: Diagnosis not present

## 2020-03-10 DIAGNOSIS — D649 Anemia, unspecified: Secondary | ICD-10-CM

## 2020-03-10 DIAGNOSIS — N289 Disorder of kidney and ureter, unspecified: Secondary | ICD-10-CM

## 2020-03-10 DIAGNOSIS — G4733 Obstructive sleep apnea (adult) (pediatric): Secondary | ICD-10-CM | POA: Diagnosis not present

## 2020-03-10 LAB — BASIC METABOLIC PANEL
BUN: 22 mg/dL (ref 6–23)
CO2: 32 mEq/L (ref 19–32)
Calcium: 9.2 mg/dL (ref 8.4–10.5)
Chloride: 105 mEq/L (ref 96–112)
Creatinine, Ser: 1.03 mg/dL (ref 0.40–1.50)
GFR: 67.58 mL/min (ref >60.00)
Glucose, Bld: 98 mg/dL (ref 70–99)
Potassium: 3.8 mEq/L (ref 3.5–5.1)
Sodium: 141 mEq/L (ref 135–145)

## 2020-03-10 LAB — CBC WITH DIFFERENTIAL/PLATELET
Basophils Absolute: 0.1 10*3/uL (ref 0.0–0.1)
Basophils Relative: 0.8 % (ref 0.0–3.0)
Eosinophils Absolute: 0.2 10*3/uL (ref 0.0–0.7)
Eosinophils Relative: 2.8 % (ref 0.0–5.0)
HCT: 40.9 % (ref 39.0–52.0)
Hemoglobin: 13.5 g/dL (ref 13.0–17.0)
Lymphocytes Relative: 24.8 % (ref 12.0–46.0)
Lymphs Abs: 1.6 10*3/uL (ref 0.7–4.0)
MCHC: 33.1 g/dL (ref 30.0–36.0)
MCV: 99.7 fl (ref 78.0–100.0)
Monocytes Absolute: 0.8 10*3/uL (ref 0.1–1.0)
Monocytes Relative: 13.4 % — ABNORMAL HIGH (ref 3.0–12.0)
Neutro Abs: 3.7 10*3/uL (ref 1.4–7.7)
Neutrophils Relative %: 58.2 % (ref 43.0–77.0)
Platelets: 231 10*3/uL (ref 150.0–400.0)
RBC: 4.11 Mil/uL — ABNORMAL LOW (ref 4.22–5.81)
RDW: 13.1 % (ref 11.5–15.5)
WBC: 6.3 10*3/uL (ref 4.0–10.5)

## 2020-03-10 NOTE — Patient Instructions (Signed)
Order- lab- BMET, CBC w diff    Dx renal insufficiency, Anemia  Order- Overnight oximetry  Dx Nocturnal hypoxemia  Elevate your legs as much as you can, to reduce edema  See your primary care provider for an office visit to manage your leg edema  Please cal if we can help

## 2020-03-10 NOTE — Telephone Encounter (Signed)
Pt called back and appt scheduled

## 2020-03-10 NOTE — Progress Notes (Signed)
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 [facility]. 2015 AHI 4.1/ hr, RDI 24.2/ hr CPAP auto 14-18/ [employer] Download compliance 93%, AHI 1/ hr. Epworth score 18 Body weight today 192 lbs Was being followed at [facility]- too far to drive from [place].  Medical problem list includes Upper Airway Resistance Syndrome, Herniated Cervical Disc, BPH, Restless Legs He has been using same CPAP machine since original sleep study. Says his wife really complains of his snoring if he doesn't wear it. Complains of too much pressure. Restless Legs- "walks the floor in the evening while wife trying to watch TV.  Requip helps legs, causes some drowsiness. Denies drowsy driving.  Up 3 times during the night. No other sleep meds. Hx DVT w/o PE- took anticoagulant in the past. Hx CVA.  03/10/20- 89 yoM never smoker followed for Nocturnal Hypoxemia, Snoring,  complicated by  Herniated Cervical Disc, BPH, Restless Legs HST 01/05/20- AHI 3/hr, desaturation to 75% with average 87%, body weight 192 /bs. He doesn't have OSA, but does have nocturnal hypoxemia DME couldn't use the HST as documentation for need for sleep O2 and he needed ov as well. Now needs ONOX to confirm before ordering sleep O2. Covid Vax- Had 2 Moderna covax Body weight today 194 lbs Wife has told him he snores w/o CPAP, but he says "it doesn't bother me". Legs have been swelling x 6 months, despite lasix. Past hx DVT, now on ASA 325. Lab 11/19/19- BUN 45, Cr 1.99  ROS-see HPI   + = positive Constitutional:    weight loss, night sweats, fevers, chills, fatigue, lassitude. HEENT:    headaches, +difficulty swallowing, tooth/dental problems, sore throat,       Sneezing,+ itching, ear ache, nasal congestion, post nasal drip, snoring CV:    chest pain, orthopnea, PND, +swelling in lower extremities, anasarca,                                  dizziness, palpitations Resp:  +  shortness of breath with exertion or at rest.                productive cough,   non-productive cough, coughing up of blood.              change in color of mucus.  wheezing.   Skin:    rash or lesions. GI:  + heartburn, indigestion, abdominal pain, nausea, vomiting, diarrhea,                 change in bowel habits, loss of appetite GU: dysuria, change in color of urine, no urgency or frequency.   flank pain. MS:  + joint pain, stiffness, decreased range of motion, back pain. Neuro-     nothing unusual Psych:  change in mood or affect.  depression or +anxiety.   memory loss.  OBJ- Physical Exam General- Alert, Oriented, Affect-appropriate, Distress- none acute Skin- rash-none, lesions- none, excoriation- none Lymphadenopathy- none Head- atraumatic            Eyes- Gross vision intact, PERRLA, conjunctivae and secretions clear            Ears- Hearing, canals-normal            Nose- Clear, no-Septal dev, mucus, polyps, erosion, perforation             Throat- Mallampati II , mucosa clear , drainage- none,  tonsils- atrophic Neck- flexible , trachea midline, no stridor , thyroid nl, carotid no bruit Chest - symmetrical excursion , unlabored           Heart/CV- RRR , no murmur , no gallop  , no rub, nl s1 s2                           - JVD+ 1cm , edema+4 bilat, w vesicles,            Lung- clear to P&A, wheeze- none, cough- none , dullness-none, rub- none           Chest wall-  Abd-  Br/ Gen/ Rectal- Not done, not indicated Extrem- + heavy legs, + superficial varices Neuro- grossly intact to observation

## 2020-03-10 NOTE — Telephone Encounter (Signed)
Called pt and wasn't home , spoke with wife and told her to tell him to give [place] a call back.

## 2020-03-13 ENCOUNTER — Telehealth: Payer: Self-pay | Admitting: Medical

## 2020-03-13 DIAGNOSIS — I890 Lymphedema, not elsewhere classified: Secondary | ICD-10-CM

## 2020-03-13 DIAGNOSIS — G4734 Idiopathic sleep related nonobstructive alveolar hypoventilation: Secondary | ICD-10-CM | POA: Insufficient documentation

## 2020-03-13 DIAGNOSIS — R600 Localized edema: Secondary | ICD-10-CM

## 2020-03-13 DIAGNOSIS — R609 Edema, unspecified: Secondary | ICD-10-CM | POA: Insufficient documentation

## 2020-03-13 NOTE — Assessment & Plan Note (Signed)
Nocturnal hypoxemia without obstructive sleep apnea. Peripheral edema may reflect renal insufficiency, Some R heart failure, and/ or pulmonary hypertension. Plan- ordering room air overnight oximetry off CPAP to see if he qualifies for home O2 during sleep.

## 2020-03-13 NOTE — Telephone Encounter (Signed)
Referral placed to vascular specialist.

## 2020-03-13 NOTE — Assessment & Plan Note (Signed)
This may be multifactorial. I have asked him to see his PCP in person to assess need for nephrology, cardiology or other evaluation as appropriate. We will recheck renal function chemistry to get that started because he had Cr 1.99 in May.

## 2020-03-13 NOTE — Assessment & Plan Note (Signed)
May qualify as UARS based b RDI, but otherwise doesn't have OSA

## 2020-03-14 ENCOUNTER — Ambulatory Visit (INDEPENDENT_AMBULATORY_CARE_PROVIDER_SITE_OTHER): Payer: Medicare Other | Admitting: Medical

## 2020-03-14 ENCOUNTER — Other Ambulatory Visit: Payer: Self-pay

## 2020-03-14 VITALS — BP 124/66 | HR 88 | Resp 18 | Ht 69.0 in | Wt 193.2 lb

## 2020-03-14 DIAGNOSIS — R0683 Snoring: Secondary | ICD-10-CM | POA: Diagnosis not present

## 2020-03-14 DIAGNOSIS — T148XXA Other injury of unspecified body region, initial encounter: Secondary | ICD-10-CM | POA: Diagnosis not present

## 2020-03-14 DIAGNOSIS — R600 Localized edema: Secondary | ICD-10-CM

## 2020-03-14 DIAGNOSIS — G629 Polyneuropathy, unspecified: Secondary | ICD-10-CM

## 2020-03-14 DIAGNOSIS — I10 Essential (primary) hypertension: Secondary | ICD-10-CM | POA: Diagnosis not present

## 2020-03-14 DIAGNOSIS — L089 Local infection of the skin and subcutaneous tissue, unspecified: Secondary | ICD-10-CM

## 2020-03-14 DIAGNOSIS — G2581 Restless legs syndrome: Secondary | ICD-10-CM

## 2020-03-14 DIAGNOSIS — G4730 Sleep apnea, unspecified: Secondary | ICD-10-CM | POA: Diagnosis not present

## 2020-03-14 MED ORDER — DOXYCYCLINE HYCLATE 100 MG PO TABS: 100.0000 mg | ORAL_TABLET | Freq: Two times a day (BID) | ORAL | 0 refills | Status: DC

## 2020-03-14 NOTE — Patient Instructions (Signed)
Your bp is well controlled today. I don't recommend any more diuretics since bp controlled tightly.  Your gfr recently improved so another good reason to hold off on any diuretics.  For pedal edema will refer to vascular surgeon. Evaluate if they thinks component of lymphedema. Will see what they think. Possible pneumatic compression although recently not real swollen so that may not be indicated.  Left lower ext wound infection after derm procedure. Got clulture of wound and placing on doxycycline. Rx advisement given.  We need to follow this wound closely, If not healing well then may need unaboots.  Follow up in 10 days or as needed

## 2020-03-14 NOTE — Progress Notes (Signed)
   Subjective:    Patient ID: Brandon Mason, male    DOB: [birth year], 86 y.o.   MRN: [id number]  HPI   Pt in for follow up.  Pt had decreased gfr in the past. He has not been on any diuretic and his gfr 4 days ago improved to 67 from 31 months ago.   Hx of long standing pedal edema bilaterally. Failed compression sock and also gave large ace bandages. Ace bandages did not help.  Numerous time in past checked for DVT and most recent none. Swelling is symmetric. No sob. Pt expressed extreme reluctance last time ordered US since prior was negative.  Pt states still having swelling. Little bit less than last. Prior cxr and bnp negative for chf.  2 weeks ago pt had large lesion removed from his left anterior tibial area. The area looks like it is not healing and he has creamy yellow brown dc.  Hx of restless leg syndrome and neuropathy. Controlled with tramadol and lyrica. Up to date on uds.   Review of Systems  Constitutional: Negative for chills, fatigue and fever.  HENT: Negative for congestion, drooling and ear pain.   Respiratory: Negative for cough, chest tightness, shortness of breath and wheezing.   Cardiovascular: Negative for chest pain and palpitations.  Gastrointestinal: Negative for abdominal pain, blood in stool, diarrhea, nausea and vomiting.  Musculoskeletal: Negative for arthralgias, back pain and myalgias.       Pedal edema.  Skin:       See hpi and physical exam  Neurological: Negative for dizziness, syncope, weakness, numbness and headaches.  Hematological: Negative for adenopathy. Does not bruise/bleed easily.  Psychiatric/Behavioral: Negative for behavioral problems and confusion.       Objective:   Physical Exam   General- No acute distress. Pleasant patient. Neck- Full range of motion, no jvd Lungs- Clear, even and unlabored. Heart- regular rate and rhythm. Neurologic- CNII- XII grossly intact. Lower ext- symmetric 1-2+ pedal edema presently.  Negative homans signs. Edema down to feet. Left pretibial area 1 cm wide wound with yellow brow dc. surrond skin pretibial area pinkish red and faint warmth. Not real tender.    Assessment & Plan:  Your bp is well controlled today. I don't recommend any more diuretics since bp controlled tightly.  Your gfr recently improved so another good reason to hold off on any diuretics.  For pedal edema will refer to vascular surgeon. Evaluate if they thinks component of lymphedema. Will see what they think. Possible pneumatic compression although recently not real swollen so that may not be indicated.  Left lower ext wound infection after derm procedure. Got clulture of wound and placing on doxycycline. Rx advisement given.  We need to follow this wound closely, If not healing well then may need unaboots.  Follow up in 10 days or as needed

## 2020-03-17 LAB — WOUND CULTURE
MICRO NUMBER:: 10948189
SPECIMEN QUALITY:: ADEQUATE

## 2020-03-18 ENCOUNTER — Telehealth: Payer: Self-pay | Admitting: Medical

## 2020-03-18 MED ORDER — AMOXICILLIN-POT CLAVULANATE 875-125 MG PO TABS: 1.0000 | ORAL_TABLET | Freq: Two times a day (BID) | ORAL | 0 refills | Status: DC

## 2020-03-18 NOTE — Telephone Encounter (Signed)
Rx doxy sent to pt pharmacy. 

## 2020-03-22 ENCOUNTER — Other Ambulatory Visit: Payer: Self-pay | Admitting: Medical

## 2020-03-31 ENCOUNTER — Telehealth: Payer: Self-pay | Admitting: Medical

## 2020-03-31 MED ORDER — LISINOPRIL 20 MG PO TABS
20.0000 mg | ORAL_TABLET | Freq: Every day | ORAL | 0 refills | Status: DC
Start: 2020-03-31 — End: 2020-05-23

## 2020-03-31 MED ORDER — AMLODIPINE BESYLATE 10 MG PO TABS
10.0000 mg | ORAL_TABLET | Freq: Every day | ORAL | 0 refills | Status: DC
Start: 2020-03-31 — End: 2020-05-15

## 2020-03-31 MED ORDER — ROPINIROLE HCL 4 MG PO TABS: 4.0000 mg | ORAL_TABLET | Freq: Every day | ORAL | 5 refills | Status: DC

## 2020-03-31 NOTE — Telephone Encounter (Signed)
All medications not controlled sent to [facility]

## 2020-03-31 NOTE — Telephone Encounter (Signed)
Brandon Mason wants all meds sent over to [facility] with a 90 day supply. [facility] is waiting for a script from provider so they can take over patients meds

## 2020-04-07 ENCOUNTER — Telehealth: Payer: Self-pay | Admitting: Internal Medicine

## 2020-04-07 ENCOUNTER — Telehealth: Payer: Self-pay | Admitting: Medical

## 2020-04-07 DIAGNOSIS — G4734 Idiopathic sleep related nonobstructive alveolar hypoventilation: Secondary | ICD-10-CM

## 2020-04-07 NOTE — Telephone Encounter (Signed)
Pt.notified

## 2020-04-07 NOTE — Telephone Encounter (Signed)
Lm for patient.   ONO results have been located in Dr. Bertrum Sol look out bin.

## 2020-04-07 NOTE — Telephone Encounter (Signed)
Tramadol is controlled medication. I can write max of 30 days worth at any one time. That is law of [place]. Non controlled meds can send to his mail order but can't do that with controlled meds. He does have refills on rx I sent last month to [facility].   For tramadol can't send in one 90 day rx. Has to be in 30 days rx's  Notify pt.

## 2020-04-07 NOTE — Telephone Encounter (Signed)
Caller Ikaika Showers Call Back : [phone]   Patient is calling in reference to medication refill, patient states he would like [facility] for 90 days supply.   Medication: traMADol (ULTRAM) 50 MG tablet [[id number]]  Has the patient contacted their pharmacy? No. (If no, request that the patient contact the pharmacy for the refill.) (If yes, when and what did the pharmacy advise?)  Preferred Pharmacy (with phone number or street name):[facility]  [address] [place], [address]  [place] [address] [phone] Agent: Please be advised that RX refills may take up to 3 business days. We ask that you follow-up with your pharmacy.

## 2020-04-10 NOTE — Telephone Encounter (Signed)
Called and spoke with pt. Pt is requesting to know the results of the ONO he had performed. Dr. Annamaria Boots, please advise.

## 2020-04-10 NOTE — Telephone Encounter (Signed)
Community message sent to [employer] to resend over ONO results. Waiting on response from [facility].

## 2020-04-10 NOTE — Telephone Encounter (Signed)
Pt calling back to get the results of ono. Pt can be reached at [phone]

## 2020-04-10 NOTE — Telephone Encounter (Signed)
We ordered overnight oximetry on 9/10. I don't have it - result may be away being scanned. Please see if we can get another copy of report from the DME company. Thanks.

## 2020-04-12 ENCOUNTER — Encounter: Payer: Medicare Other | Admitting: Vascular Surgery

## 2020-04-12 ENCOUNTER — Encounter (HOSPITAL_COMMUNITY): Payer: Medicare Other

## 2020-04-18 NOTE — Telephone Encounter (Signed)
Triage, has this ONO been received?

## 2020-04-18 NOTE — Telephone Encounter (Signed)
Checked Lauren's inbox for a community message response from [employer]. There is no response. New message has been sent to [employer].

## 2020-04-19 ENCOUNTER — Other Ambulatory Visit: Payer: Self-pay | Admitting: *Deleted

## 2020-04-19 DIAGNOSIS — G2581 Restless legs syndrome: Secondary | ICD-10-CM | POA: Diagnosis not present

## 2020-04-19 DIAGNOSIS — M48061 Spinal stenosis, lumbar region without neurogenic claudication: Secondary | ICD-10-CM | POA: Diagnosis not present

## 2020-04-19 MED ORDER — FINASTERIDE 5 MG PO TABS
5.0000 mg | ORAL_TABLET | Freq: Every day | ORAL | 0 refills | Status: DC
Start: 2020-04-19 — End: 2020-06-20

## 2020-04-25 NOTE — Telephone Encounter (Signed)
Dr Annamaria Boots- have you reviewed the ONO yet on this pt?

## 2020-04-25 NOTE — Telephone Encounter (Signed)
As of 10/8 phone call I think we are still looking for this overnight oximetry report and [employer] hadn't responded. Please see if we can track it down

## 2020-04-25 NOTE — Telephone Encounter (Signed)
Triage, has this ONO been received?

## 2020-04-25 NOTE — Telephone Encounter (Signed)
No ONO has been received  Community msg unsuccessful  Tried calling [employer] to speak with someone  Hold time was 22 min  WCB later

## 2020-04-26 DIAGNOSIS — Z23 Encounter for immunization: Secondary | ICD-10-CM | POA: Diagnosis not present

## 2020-04-26 NOTE — Telephone Encounter (Signed)
Tried calling [employer] and put on hold for 20 min  WCB

## 2020-04-27 ENCOUNTER — Other Ambulatory Visit: Payer: Self-pay

## 2020-04-27 DIAGNOSIS — I8390 Asymptomatic varicose veins of unspecified lower extremity: Secondary | ICD-10-CM

## 2020-04-27 DIAGNOSIS — R600 Localized edema: Secondary | ICD-10-CM

## 2020-04-27 NOTE — Telephone Encounter (Signed)
[place] and spoke to [place] and was advised he will send a message to [facility] to get the results faxed over asap (front fax).

## 2020-04-28 ENCOUNTER — Other Ambulatory Visit: Payer: Self-pay

## 2020-04-28 DIAGNOSIS — I8390 Asymptomatic varicose veins of unspecified lower extremity: Secondary | ICD-10-CM

## 2020-04-28 DIAGNOSIS — R600 Localized edema: Secondary | ICD-10-CM

## 2020-05-02 DIAGNOSIS — G2581 Restless legs syndrome: Secondary | ICD-10-CM | POA: Diagnosis not present

## 2020-05-02 NOTE — Telephone Encounter (Signed)
lmtcb for pt. Order pended in chart, will place order after speaking to patient.

## 2020-05-02 NOTE — Telephone Encounter (Signed)
We finally have result of his overnight oximetry from 9/15. His oxygen level on roomair was 88% or less for over 2 hours, so he qualifies to wear oxygen while he sleeps.   Please order DME [employer]/ [place]  New home O2 for sleep 2L   Dx nocturnal hypoxemia.

## 2020-05-02 NOTE — Addendum Note (Signed)
Addended by: Len Blalock on: 05/02/2020 01:55 PM   Modules accepted: Orders

## 2020-05-10 ENCOUNTER — Ambulatory Visit (INDEPENDENT_AMBULATORY_CARE_PROVIDER_SITE_OTHER): Payer: Medicare Other | Admitting: Vascular Surgery

## 2020-05-10 ENCOUNTER — Ambulatory Visit (HOSPITAL_COMMUNITY)
Admission: RE | Admit: 2020-05-10 | Discharge: 2020-05-10 | Disposition: A | Discharge status: Home / Self Care | Payer: Medicare Other | Source: Ambulatory Visit | Attending: Vascular Surgery | Admitting: Vascular Surgery

## 2020-05-10 ENCOUNTER — Encounter: Payer: Self-pay | Admitting: Vascular Surgery

## 2020-05-10 ENCOUNTER — Other Ambulatory Visit: Payer: Self-pay

## 2020-05-10 VITALS — BP 120/75 | HR 82 | Temp 98.1°F | Resp 16 | Ht 69.0 in | Wt 180.0 lb

## 2020-05-10 DIAGNOSIS — R600 Localized edema: Secondary | ICD-10-CM

## 2020-05-10 DIAGNOSIS — I8390 Asymptomatic varicose veins of unspecified lower extremity: Secondary | ICD-10-CM | POA: Insufficient documentation

## 2020-05-10 DIAGNOSIS — M7989 Other specified soft tissue disorders: Secondary | ICD-10-CM

## 2020-05-10 NOTE — Progress Notes (Signed)
Patient name: Brandon Mason MRN: [id number] DOB: 05/04/1926 Sex: male  HPI: Brandon Mason is a 86 y.o. male, with a several year history of leg swelling dating back at least to 2013.  He did have a DVT in 2013 and was on Eliquis for about a year.  He has had chronic swelling in both legs from the knee down to the ankle bilaterally.  He occasionally gets some pedal and ankle edema.  He has never really had any skin breakdown.  He also has a history of restless leg syndrome and bilateral peripheral neuropathy.  Other medical problems include hypertension, arthritis, both of which are stable.  Past Medical History:  Diagnosis Date  . Abnormality of gait 02/09/2013  . Arthritis   . Cancer ([place])    basal cell skin cancer  . Cardiomegaly   . Chronic kidney disease    BPH  . Chronic leg pain   . Hypersomnia   . Hypertension   . Hypoxia    pulmonary  . Neuromuscular disorder (HCC)    restless leg syndrome   . Nocturia   . Prostate disorder   . Restless legs syndrome (RLS) 02/09/2013  . Restless legs syndrome with nocturnal myoclonus   . RLS (restless legs syndrome)    x 40 years  . Shortness of breath    with exertion   . Sleep apnea    uses cpap  . Stroke [facility])    minor stroke in 2003 - no lasting side effects  . UARS (upper airway resistance syndrome) 06/28/2014   Past Surgical History:  Procedure Laterality Date  . ANTERIOR CERVICAL DECOMP/DISCECTOMY FUSION N/A 03/29/2016   Procedure: ANTERIOR CERVICAL DECOMPRESSION/DISCECTOMY FUSION CERVICAL THREE- CERVICAL FOUR;  Surgeon: Ashok Pall, MD;  Location: [place] [facility];  Service: Neurosurgery;  Laterality: N/A;  . BACK SURGERY  2003   L4-5  . CATARACT EXTRACTION Bilateral   . HERNIA REPAIR     inguinal hernia  . TRANSURETHRAL PROSTATECTOMY WITH GYRUS INSTRUMENTS  04/28/2012   Procedure: TRANSURETHRAL PROSTATECTOMY WITH GYRUS INSTRUMENTS;  Surgeon: Fredricka Bonine, MD;  Location: [facility];  Service: Urology;  Laterality:  N/A;  . TRANSURETHRAL RESECTION OF PROSTATE  03/24/2012   Procedure: TRANSURETHRAL RESECTION OF THE PROSTATE (TURP);  Surgeon: Fredricka Bonine, MD;  Location: [facility];  Service: Urology;  Laterality: N/A;  with gyrus ,Greenlight PVP laser of Prostate    Family History  Problem Relation Age of Onset  . Restless legs syndrome Mother   . Restless legs syndrome Daughter   . Esophageal cancer Neg Hx   . Colon cancer Neg Hx   . Pancreatic cancer Neg Hx   . Stomach cancer Neg Hx     SOCIAL HISTORY: Social History   Socioeconomic History  . Marital status: Married    Spouse name: [name]  . Number of children: 5  . Years of education: 36  . Highest education level: Not on file  Occupational History  . 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 Smoking status: Never Smoker  . Smokeless tobacco: Never Used  Substance and Sexual Activity  . Alcohol use: Yes    Alcohol/week: 2.0 standard drinks    Types: 2 Cans of beer per week    Comment: weekly  . Drug use: No  . Sexual activity: Not on file  Other Topics Concern  . Not on file  Social History Narrative   Patient lives  at home with his wife [name].   Patient is retired.    Patient has 5 children.   Patient has a high school education.   Patient is right-handed.   Patient drinks very little caffeine.   Social Determinants of Health   Financial Resource Strain:   . Difficulty of Paying Living Expenses: Not on file  Food Insecurity:   . Worried About Charity fundraiser in the Last Year: Not on file  . Ran Out of Food in the Last Year: Not on file  Transportation Needs:   . Lack of Transportation (Medical): Not on file  . Lack of Transportation (Non-Medical): Not on file  Physical Activity:   . Days of Exercise per Week: Not on file  . Minutes of Exercise per Session: Not on file  Stress:   . Feeling of Stress : Not on file  Social Connections:   . Frequency of Communication with  Friends and Family: Not on file  . Frequency of Social Gatherings with Friends and Family: Not on file  . Attends Religious Services: Not on file  . Active Member of Clubs or Organizations: Not on file  . Attends Archivist Meetings: Not on file  . Marital Status: Not on file  Intimate Partner Violence:   . Fear of Current or Ex-Partner: Not on file  . Emotionally Abused: Not on file  . Physically Abused: Not on file  . Sexually Abused: Not on file    No Known Allergies  Current Outpatient Medications  Medication Sig Dispense Refill  . amLODipine (NORVASC) 10 MG tablet Take 1 tablet (10 mg total) by mouth daily. 90 tablet 0  . finasteride (PROSCAR) 5 MG tablet Take 1 tablet (5 mg total) by mouth daily. 90 tablet 0  . lisinopril (ZESTRIL) 20 MG tablet Take 1 tablet (20 mg total) by mouth daily. 90 tablet 0  . mupirocin ointment (BACTROBAN) 2 % APPLY  OINTMENT TOPICALLY TO AFFECTED AREA TWICE DAILY 22 g 0  . pregabalin (LYRICA) 150 MG capsule Take 150 mg by mouth 2 (two) times daily.    Marland Kitchen rOPINIRole (REQUIP) 4 MG tablet Take 1 tablet (4 mg total) by mouth at bedtime. 30 tablet 5  . traMADol (ULTRAM) 50 MG tablet Take 1 tablet (50 mg total) by mouth every 8 (eight) hours as needed. for pain 90 tablet 2  . amoxicillin-clavulanate (AUGMENTIN) 875-125 MG tablet Take 1 tablet by mouth 2 (two) times daily. (Patient not taking: Reported on 05/10/2020) 20 tablet 0  . nystatin cream (MYCOSTATIN) APPLY  CREAM TOPICALLY TO AFFECTED AREA TWICE DAILY 30 g 0   No current facility-administered medications for this visit.    ROS:   General:  No weight loss, Fever, chills  HEENT: No recent headaches, no nasal bleeding, no visual changes, no sore throat  Neurologic: No dizziness, blackouts, seizures. No recent symptoms of stroke or mini- stroke. No recent episodes of slurred speech, or temporary blindness.  Cardiac: No recent episodes of chest pain/pressure, no shortness of breath at  rest.  No shortness of breath with exertion.  Denies history of atrial fibrillation or irregular heartbeat  Vascular: No history of rest pain in feet.  No history of claudication.  No history of non-healing ulcer, No history of DVT   Pulmonary: No home oxygen, no productive cough, no hemoptysis,  No asthma or wheezing  Musculoskeletal:  [X]  Arthritis, [ ]  Low back pain,  [X]  Joint pain  Hematologic:No history of hypercoagulable state.  No history of easy bleeding.  No history of anemia  Gastrointestinal: No hematochezia or melena,  No gastroesophageal reflux, no trouble swallowing  Urinary: [ ]  chronic Kidney disease, [ ]  on HD - [ ]  MWF or [ ]  TTHS, [ ]  Burning with urination, [ ]  Frequent urination, [ ]  Difficulty urinating;   Skin: No rashes  Psychological: No history of anxiety,  No history of depression   Physical Examination   Vitals:   05/10/20 1433  BP: 120/75  Pulse: 82  Resp: 16  Temp: 98.1 F (36.7 C)  TempSrc: Temporal  SpO2: 96%  Weight: 180 lb (81.6 kg)  Height: 5\' 9"  (1.753 m)    General:  Alert and oriented, no acute distress HEENT: Normal Neck: No JVD Cardiac: Regular Rate and Rhythm Skin: No rash, healing area of dry eschar left pretibial region from previous skin biopsy a couple of months ago Extremity Pulses:  2+ radial, brachial, femoral, absent dorsalis pedis, posterior tibial pulses bilaterally Musculoskeletal: Diffuse 2+ edema from the knee to the ankle bilaterally.  This does not involve the foot.  Occasionally he states he will get some pedal and ankle edema. Neurologic: Upper and lower extremity motor 5/5 and symmetric  DATA:  Patient had a venous duplex today for reflux.  He did have reflux at the saphenofemoral junction bilaterally.  Right greater saphenous vein was 4 mm.  Left greater saphenous vein was 4 mm.  There was not much reflux below the level of the junction.  He had an echocardiogram performed in March 2021 which is essentially  normal.  He did have dilation of the ascending aorta at 4.2 cm.  He did have an element of deep vein reflux bilaterally on his duplex today as well.  He has had numerous DVT ultrasounds in the past dating back to 2017 all of which have been negative for DVT  ASSESSMENT: Chronic lower extremity edema from combination of superficial and deep venous reflux.  This does not really appear to be lymphedema.  He also has a history of restless legs and peripheral neuropathy and this may also be contributing to his symptoms.  I would not consider him for a vein intervention.  The patient also in light of his age has not interested in any interventions.   PLAN: Patient was fitted today for bilateral lower extremity compression stockings 20 to 30 mmHg he will try to wear these as much as possible during the day to control swelling symptoms and elevate his leg to his legs when possible.  I also did discuss with him the fact that he had had a carotid duplex several years ago which showed moderate carotid stenosis.  He remains asymptomatic from this.  He is also not interested in any carotid interventions.  We will not scan this any further.  He is on amlodipine for blood pressure control and this is known to cause leg edema in some cases as well.  I will leave this at the discretion of his primary care provider whether or not to change his blood pressure medications.  Patient will follow up on an as-needed basis.   Ruta Hinds, MD [facility] Office: [phone]

## 2020-05-13 ENCOUNTER — Other Ambulatory Visit: Payer: Self-pay | Admitting: Medical

## 2020-05-22 ENCOUNTER — Other Ambulatory Visit: Payer: Self-pay | Admitting: Medical

## 2020-05-30 DIAGNOSIS — Z23 Encounter for immunization: Secondary | ICD-10-CM | POA: Diagnosis not present

## 2020-06-08 ENCOUNTER — Encounter: Payer: Self-pay | Admitting: Internal Medicine

## 2020-06-11 NOTE — Progress Notes (Deleted)
HPI 14 yoM never smoker followed for Nocturnal Hypoxemia, Snoring, Restless Legs, complicated by  Herniated Cervical Disc, BPH, Hx DVT, Hx PE NPSG [facility]/ [facility]. 2015 AHI 4.1/ hr, RDI 24.2/ hr HST 01/05/20- AHI 3/hr, desaturation to 75% with average 87%, body weight 192 /bs.  ===================================================================   03/10/20- 89 yoM never smoker followed for Nocturnal Hypoxemia, Snoring, Restless Legs,  complicated by  Herniated Cervical Disc, BPH, Hx DVT w/o PE, hx CVA,  HST 01/05/20- AHI 3/hr, desaturation to 75% with average 87%, body weight 192 /bs. He doesn't have OSA, but does have nocturnal hypoxemia DME couldn't use the HST as documentation for need for sleep O2 and he needed ov as well. Now needs ONOX to confirm before ordering sleep O2. Covid Vax- Had 2 Moderna covax Body weight today 194 lbs Wife has told him he snores w/o CPAP, but he says "it doesn't bother me". Legs have been swelling x 6 months, despite lasix. Past hx DVT, now on ASA 325. Lab 11/19/19- BUN 45, Cr 1.99  06/12/20- 31 yoM never smoker followed for Nocturnal Hypoxemia, Snoring, Restless Legs,  complicated by  Herniated Cervical Disc, BPH, Hx DVT w/o PE, hx CVA Ropinirole 4 mg at hs,  O2 2L sleep/ [employer] CPAP auto 8-18/ [employer]   ? Replaced by sleep O2 after last sleep study ? Download- Body weight today- Covid vax- Flu vax-      ROS-see HPI   + = positive Constitutional:    weight loss, night sweats, fevers, chills, fatigue, lassitude. HEENT:    headaches, +difficulty swallowing, tooth/dental problems, sore throat,       Sneezing,+ itching, ear ache, nasal congestion, post nasal drip, snoring CV:    chest pain, orthopnea, PND, +swelling in lower extremities, anasarca,                                  dizziness, palpitations Resp:  + shortness of breath with exertion or at rest.                productive cough,   non-productive cough, coughing up of blood.              change  in color of mucus.  wheezing.   Skin:    rash or lesions. GI:  + heartburn, indigestion, abdominal pain, nausea, vomiting, diarrhea,                 change in bowel habits, loss of appetite GU: dysuria, change in color of urine, no urgency or frequency.   flank pain. MS:  + joint pain, stiffness, decreased range of motion, back pain. Neuro-     nothing unusual Psych:  change in mood or affect.  depression or +anxiety.   memory loss.  OBJ- Physical Exam General- Alert, Oriented, Affect-appropriate, Distress- none acute Skin- rash-none, lesions- none, excoriation- none Lymphadenopathy- none Head- atraumatic            Eyes- Gross vision intact, PERRLA, conjunctivae and secretions clear            Ears- Hearing, canals-normal            Nose- Clear, no-Septal dev, mucus, polyps, erosion, perforation             Throat- Mallampati II , mucosa clear , drainage- none, tonsils- atrophic Neck- flexible , trachea midline, no stridor , thyroid nl, carotid no bruit Chest - symmetrical excursion ,  unlabored           Heart/CV- RRR , no murmur , no gallop  , no rub, nl s1 s2                           - JVD+ 1cm , edema+4 bilat, w vesicles,            Lung- clear to P&A, wheeze- none, cough- none , dullness-none, rub- none           Chest wall-  Abd-  Br/ Gen/ Rectal- Not done, not indicated Extrem- + heavy legs, + superficial varices Neuro- grossly intact to observation

## 2020-06-12 ENCOUNTER — Ambulatory Visit: Payer: Medicare Other | Admitting: Internal Medicine

## 2020-06-17 ENCOUNTER — Other Ambulatory Visit: Payer: Self-pay | Admitting: Medical

## 2020-06-19 NOTE — Telephone Encounter (Signed)
Requesting: tramadol Contract:03/2020 UDS:07/2019 Last Visit:03/2020 Next Visit:n/a Last Refill:03/2020  Please Advise

## 2020-06-20 ENCOUNTER — Telehealth: Payer: Self-pay

## 2020-06-20 ENCOUNTER — Other Ambulatory Visit: Payer: Self-pay | Admitting: Medical

## 2020-06-20 NOTE — Telephone Encounter (Signed)
Appointment made

## 2020-06-20 NOTE — Telephone Encounter (Signed)
Refilled pt tramadol. Will you get him scheduled for in office visit next month before 07-21-20. Will update uds at that time.

## 2020-06-20 NOTE — Telephone Encounter (Signed)
Vein patient - continuing to have trouble with swelling and pain. Unfortunately, he is not able to elevate his legs very much due to restless leg syndrome. According to note, not a candidate for vein procedure. Discussed talking to PCP about suggestions for management of restless leg issues - patient is already on Lyrica and Gabapentin. Patient verbalizes understanding.

## 2020-07-10 ENCOUNTER — Ambulatory Visit (INDEPENDENT_AMBULATORY_CARE_PROVIDER_SITE_OTHER): Payer: Medicare Other | Admitting: Medical

## 2020-07-10 ENCOUNTER — Other Ambulatory Visit: Payer: Self-pay

## 2020-07-10 VITALS — BP 124/70 | HR 76 | Temp 98.1°F | Resp 18 | Ht 69.0 in | Wt 187.0 lb

## 2020-07-10 DIAGNOSIS — G2581 Restless legs syndrome: Secondary | ICD-10-CM | POA: Diagnosis not present

## 2020-07-10 DIAGNOSIS — G629 Polyneuropathy, unspecified: Secondary | ICD-10-CM

## 2020-07-10 DIAGNOSIS — I10 Essential (primary) hypertension: Secondary | ICD-10-CM | POA: Diagnosis not present

## 2020-07-10 DIAGNOSIS — K146 Glossodynia: Secondary | ICD-10-CM | POA: Diagnosis not present

## 2020-07-10 DIAGNOSIS — R5383 Other fatigue: Secondary | ICD-10-CM | POA: Diagnosis not present

## 2020-07-10 DIAGNOSIS — N189 Chronic kidney disease, unspecified: Secondary | ICD-10-CM

## 2020-07-10 DIAGNOSIS — R609 Edema, unspecified: Secondary | ICD-10-CM

## 2020-07-10 LAB — CBC WITH DIFFERENTIAL/PLATELET
Basophils Absolute: 0 10*3/uL (ref 0.0–0.1)
Basophils Relative: 1 % (ref 0.0–3.0)
Eosinophils Absolute: 0.2 10*3/uL (ref 0.0–0.7)
Eosinophils Relative: 3.2 % (ref 0.0–5.0)
HCT: 40.5 % (ref 39.0–52.0)
Hemoglobin: 13.4 g/dL (ref 13.0–17.0)
Lymphocytes Relative: 31.6 % (ref 12.0–46.0)
Lymphs Abs: 1.6 10*3/uL (ref 0.7–4.0)
MCHC: 33.2 g/dL (ref 30.0–36.0)
MCV: 99.7 fl (ref 78.0–100.0)
Monocytes Absolute: 0.7 10*3/uL (ref 0.1–1.0)
Monocytes Relative: 12.8 % — ABNORMAL HIGH (ref 3.0–12.0)
Neutro Abs: 2.6 10*3/uL (ref 1.4–7.7)
Neutrophils Relative %: 51.4 % (ref 43.0–77.0)
Platelets: 241 10*3/uL (ref 150.0–400.0)
RBC: 4.06 Mil/uL — ABNORMAL LOW (ref 4.22–5.81)
RDW: 14 % (ref 11.5–15.5)
WBC: 5.1 10*3/uL (ref 4.0–10.5)

## 2020-07-10 LAB — COMPREHENSIVE METABOLIC PANEL
ALT: 10 U/L (ref 0–53)
AST: 12 U/L (ref 0–37)
Albumin: 4.2 g/dL (ref 3.5–5.2)
Alkaline Phosphatase: 74 U/L (ref 39–117)
BUN: 26 mg/dL — ABNORMAL HIGH (ref 6–23)
CO2: 28 mEq/L (ref 19–32)
Calcium: 9 mg/dL (ref 8.4–10.5)
Chloride: 105 mEq/L (ref 96–112)
Creatinine, Ser: 0.97 mg/dL (ref 0.40–1.50)
GFR: 67.97 mL/min (ref >60.00)
Glucose, Bld: 118 mg/dL — ABNORMAL HIGH (ref 70–99)
Potassium: 3.6 mEq/L (ref 3.5–5.1)
Sodium: 140 mEq/L (ref 135–145)
Total Bilirubin: 0.4 mg/dL (ref 0.2–1.2)
Total Protein: 6.9 g/dL (ref 6.0–8.3)

## 2020-07-10 LAB — IRON: Iron: 81 ug/dL (ref 42–165)

## 2020-07-10 LAB — FOLATE: Folate: 9.1 ng/mL (ref >5.9)

## 2020-07-10 LAB — VITAMIN B12: Vitamin B-12: 246 pg/mL (ref 211–911)

## 2020-07-10 MED ORDER — TRAMADOL HCL 50 MG PO TABS: 50.0000 mg | ORAL_TABLET | Freq: Three times a day (TID) | ORAL | 5 refills | Status: DC | PRN

## 2020-07-10 MED ORDER — MAGIC MOUTHWASH: 5.0000 mL | Freq: Four times a day (QID) | ORAL | 0 refills | Status: DC | PRN

## 2020-07-10 MED FILL — MAGIC MOUTHWASH D/M/L: 10 days supply | Qty: 200 | Fill #0

## 2020-07-10 NOTE — Patient Instructions (Signed)
Hypertension- well-controlled today.  Continue amlodipine 10 mg daily.  Chronic dependent edema bilaterally.  Presently legs do not look as swollen as on previous exams.  Symmetric and no pain in popliteal areas.  Recommend continue daily use of compression stockings and elevate legs daily.  History of neuropathy and restless leg syndrome.  Refilling your tramadol through your mail order pharmacy.  Continue Requip.  For fatigue and tongue pain ordered CBC, CMP, iron level, B12, B1 and folate.  For tongue pain prescribed Magic mouthwash.  Recommend feeling at the med center pharmacy.  For CKD follow metabolic panel/kidney function.  Follow-up in 3 months or as needed.

## 2020-07-10 NOTE — Progress Notes (Signed)
Subjective:    Patient ID: Brandon Mason, male    DOB: 1926/03/02, 87 y.o.   MRN: [id number]  HPI  Pt in for follow up.  Pt has chronic pedal edema. Discussed with pt in past various times. Also did refer to vascular surgeon to get opinion.   "ASSESSMENT: Chronic lower extremity edema from combination of superficial and deep venous reflux.  This does not really appear to be lymphedema.  He also has a history of restless legs and peripheral neuropathy and this may also be contributing to his symptoms.  I would not consider him for a vein intervention.  The patient also in light of his age has not interested in any interventions.   PLAN: Patient was fitted today for bilateral lower extremity compression stockings 20 to 30 mmHg he will try to wear these as much as possible during the day to control swelling symptoms and elevate his leg to his legs when possible.  I also did discuss with him the fact that he had had a carotid duplex several years ago which showed moderate carotid stenosis.  He remains asymptomatic from this.  He is also not interested in any carotid interventions.  We will not scan this any further.  He is on amlodipine for blood pressure control and this is known to cause leg edema in some cases as well.  I will leave this at the discretion of his primary care provider whether or not to change his blood pressure medications.  Patient will follow up on an as-needed basis"  Compression stocking have helped a lot when uses. Today he did not use as he wanted me to appreciate degree. In morning no swelling. Then progressively during the day the legs swell.   Pt is on CA blocker amlodipine. But his gfr is low so want to avoid diuretics. Bp is well controlled.    History of restless leg and neuropathy.   4 days of mild tender area on left side of his tongue.   He has some daily fatigue.    Review of Systems  Constitutional: Negative for chills, diaphoresis, fatigue  and fever.  Respiratory: Negative for cough, chest tightness, shortness of breath and wheezing.   Cardiovascular: Negative for chest pain and palpitations.  Gastrointestinal: Negative for abdominal pain, blood in stool, diarrhea, nausea and vomiting.  Musculoskeletal: Negative for back pain.  Psychiatric/Behavioral: Negative for behavioral problems and confusion.   Past Medical History:  Diagnosis Date  . Abnormality of gait 02/09/2013  . Arthritis   . Cancer ([place])    basal cell skin cancer  . Cardiomegaly   . Chronic kidney disease    BPH  . Chronic leg pain   . Hypersomnia   . Hypertension   . Hypoxia    pulmonary  . Neuromuscular disorder (HCC)    restless leg syndrome   . Nocturia   . Prostate disorder   . Restless legs syndrome (RLS) 02/09/2013  . Restless legs syndrome with nocturnal myoclonus   . RLS (restless legs syndrome)    x 40 years  . Shortness of breath    with exertion   . Sleep apnea    uses cpap  . Stroke [facility])    minor stroke in 2003 - no lasting side effects  . UARS (upper airway resistance syndrome) 06/28/2014     Social History   Socioeconomic History  . Marital status: Married    Spouse name: [name]  . Number of children: 5  .  Years of education: 42  . Highest education level: Not on file  Occupational History  . Occupation: retired     Comment: Production designer, theatre/television/film for a Museum/gallery curator  Tobacco Use  . Smoking status: Never Smoker  . Smokeless tobacco: Never Used  Substance and Sexual Activity  . Alcohol use: Yes    Alcohol/week: 2.0 standard drinks    Types: 2 Cans of beer per week    Comment: weekly  . Drug use: No  . Sexual activity: Not on file  Other Topics Concern  . Not on file  Social History Narrative   Patient lives at home with his wife Dewayne Hatch.   Patient is retired.    Patient has 5 children.   Patient has a high school education.   Patient is right-handed.   Patient drinks very little caffeine.   Social Determinants of Health    Financial Resource Strain: Not on file  Food Insecurity: Not on file  Transportation Needs: Not on file  Physical Activity: Not on file  Stress: Not on file  Social Connections: Not on file  Intimate Partner Violence: Not on file    Past Surgical History:  Procedure Laterality Date  . ANTERIOR CERVICAL DECOMP/DISCECTOMY FUSION N/A 03/29/2016   Procedure: ANTERIOR CERVICAL DECOMPRESSION/DISCECTOMY FUSION CERVICAL THREE- CERVICAL FOUR;  Surgeon: Coletta Memos, MD;  Location: [facility];  Service: Neurosurgery;  Laterality: N/A;  . BACK SURGERY  2003   L4-5  . CATARACT EXTRACTION Bilateral   . HERNIA REPAIR     inguinal hernia  . TRANSURETHRAL PROSTATECTOMY WITH GYRUS INSTRUMENTS  04/28/2012   Procedure: TRANSURETHRAL PROSTATECTOMY WITH GYRUS INSTRUMENTS;  Surgeon: [name], MD;  Location: [facility];  Service: Urology;  Laterality: N/A;  . TRANSURETHRAL RESECTION OF PROSTATE  03/24/2012   Procedure: TRANSURETHRAL RESECTION OF THE PROSTATE (TURP);  Surgeon: [name], MD;  Location: [facility];  Service: Urology;  Laterality: N/A;  with gyrus ,Greenlight PVP laser of Prostate    Family History  Problem Relation Age of Onset  . Restless legs syndrome Mother   . Restless legs syndrome Daughter   . Esophageal cancer Neg Hx   . Colon cancer Neg Hx   . Pancreatic cancer Neg Hx   . Stomach cancer Neg Hx     No Known Allergies  Current Outpatient Medications on File Prior to Visit  Medication Sig Dispense Refill  . amLODipine (NORVASC) 10 MG tablet TAKE 1 TABLET BY MOUTH  DAILY 90 tablet 3  . amoxicillin-clavulanate (AUGMENTIN) 875-125 MG tablet Take 1 tablet by mouth 2 (two) times daily. (Patient not taking: No sig reported) 20 tablet 0  . finasteride (PROSCAR) 5 MG tablet TAKE 1 TABLET BY MOUTH  DAILY 90 tablet 3  . lisinopril (ZESTRIL) 20 MG tablet TAKE 1 TABLET BY MOUTH  DAILY 90 tablet 3  . mupirocin ointment (BACTROBAN) 2 % APPLY  OINTMENT TOPICALLY TO  AFFECTED AREA TWICE DAILY 22 g 0  . nystatin cream (MYCOSTATIN) APPLY  CREAM TOPICALLY TO AFFECTED AREA TWICE DAILY 30 g 0  . pregabalin (LYRICA) 150 MG capsule Take 150 mg by mouth 2 (two) times daily.    Marland Kitchen rOPINIRole (REQUIP) 4 MG tablet Take 1 tablet (4 mg total) by mouth at bedtime. 30 tablet 5   No current facility-administered medications on file prior to visit.    BP 124/70   Pulse 76   Temp 98.1 F (36.7 C) (Oral)   Resp 18   Ht 5\' 9"  (1.753  m)   Wt 187 lb (84.8 kg)   SpO2 95%   BMI 27.62 kg/m       Objective:   Physical Exam  General Mental Status- Alert. General Appearance- Not in acute distress.   Skin General: Color- Normal Color. Moisture- Normal Moisture.  Neck Carotid Arteries- Normal color. Moisture- Normal Moisture. No carotid bruits. No JVD.  Chest and Lung Exam Auscultation: Breath Sounds:-Normal.  Cardiovascular Auscultation:Rythm- Regular. Murmurs & Other Heart Sounds:Auscultation of the heart reveals- No Murmurs.  Abdomen Inspection:-Inspeection Normal. Palpation/Percussion:Note:No mass. Palpation and Percussion of the abdomen reveal- Non Tender, Non Distended + BS, no rebound or guarding.    Neurologic Cranial Nerve exam:- CN III-XII intact(No nystagmus), symmetric smile. Strength:- 5/5 equal and symmetric strength both upper and lower extremities.  Lower ext- calfs are symmetric. Mild symmetric swelling. Negative homans signs.  Back- no cva tenderness.  heent- mild tender area left side of tongue. No breakdown. No ulcers.     Assessment & Plan:  Hypertension- well-controlled today.  Continue amlodipine 10 mg daily.  Chronic dependent edema bilaterally.  Presently legs do not look as swollen as on previous exams.  Symmetric and no pain in popliteal areas.  Recommend continue daily use of compression stockings and elevate legs daily.  History of neuropathy and restless leg syndrome.  Refilling your tramadol through your mail order  pharmacy.  Continue Requip.  For fatigue and tongue pain ordered CBC, CMP, iron level, B12, B1 and folate.  For tongue pain prescribed Magic mouthwash.  Recommend feeling at the med center pharmacy.  For CKD follow metabolic panel/kidney function.  Follow-up in 3 months or as needed.

## 2020-07-14 LAB — VITAMIN B1: Vitamin B1 (Thiamine): 16 nmol/L (ref 8–30)

## 2020-07-28 DIAGNOSIS — H35373 Puckering of macula, bilateral: Secondary | ICD-10-CM | POA: Diagnosis not present

## 2020-07-28 DIAGNOSIS — Z961 Presence of intraocular lens: Secondary | ICD-10-CM | POA: Diagnosis not present

## 2020-07-28 DIAGNOSIS — H04123 Dry eye syndrome of bilateral lacrimal glands: Secondary | ICD-10-CM | POA: Diagnosis not present

## 2020-07-28 DIAGNOSIS — D4981 Neoplasm of unspecified behavior of retina and choroid: Secondary | ICD-10-CM | POA: Diagnosis not present

## 2020-07-28 DIAGNOSIS — H35343 Macular cyst, hole, or pseudohole, bilateral: Secondary | ICD-10-CM | POA: Diagnosis not present

## 2020-08-01 ENCOUNTER — Other Ambulatory Visit: Payer: Self-pay

## 2020-08-01 ENCOUNTER — Telehealth: Payer: Self-pay

## 2020-08-01 ENCOUNTER — Ambulatory Visit (INDEPENDENT_AMBULATORY_CARE_PROVIDER_SITE_OTHER): Payer: Medicare Other

## 2020-08-01 DIAGNOSIS — E538 Deficiency of other specified B group vitamins: Secondary | ICD-10-CM | POA: Diagnosis not present

## 2020-08-01 MED ORDER — CYANOCOBALAMIN 1000 MCG/ML IJ SOLN
1000.0000 ug | Freq: Once | INTRAMUSCULAR | Status: AC
  Administered 2020-08-01: 1000 ug via INTRAMUSCULAR

## 2020-08-01 NOTE — Telephone Encounter (Signed)
Patient was here this morning for nurse visit. At visit he was stating his memory has worsened and would like to know if theres anything he can take to help improve his memory function.   I told him I was not exactly sure but I would speak with you. Please advise on what I can tell him.

## 2020-08-01 NOTE — Progress Notes (Cosign Needed Addendum)
Pt here for weekly B12 injection per Percell Miller. This is first of four weekly injections.   B12 106mcg given IM in left deltoid, and pt tolerated injection well.  Next B12 injection scheduled for next week.   Mackie Pai, [name]

## 2020-08-01 NOTE — Telephone Encounter (Signed)
If you would offer him appointment can do mini-mental status exam and then possible referral to neurologist.   Discuss med option vs referral at visit.

## 2020-08-01 NOTE — Telephone Encounter (Signed)
Scheduled appt with you next week. He will need 2/4 weekly b12 on same day.

## 2020-08-09 ENCOUNTER — Encounter: Payer: Self-pay | Admitting: Medical

## 2020-08-09 ENCOUNTER — Ambulatory Visit (INDEPENDENT_AMBULATORY_CARE_PROVIDER_SITE_OTHER): Payer: Medicare Other | Admitting: Medical

## 2020-08-09 ENCOUNTER — Other Ambulatory Visit: Payer: Self-pay

## 2020-08-09 ENCOUNTER — Ambulatory Visit: Payer: Medicare Other

## 2020-08-09 VITALS — BP 120/86 | HR 86 | Temp 97.8°F | Resp 18 | Ht 69.0 in | Wt 188.8 lb

## 2020-08-09 DIAGNOSIS — E538 Deficiency of other specified B group vitamins: Secondary | ICD-10-CM | POA: Diagnosis not present

## 2020-08-09 DIAGNOSIS — I10 Essential (primary) hypertension: Secondary | ICD-10-CM | POA: Diagnosis not present

## 2020-08-09 DIAGNOSIS — Z008 Encounter for other general examination: Secondary | ICD-10-CM | POA: Diagnosis not present

## 2020-08-09 DIAGNOSIS — G5603 Carpal tunnel syndrome, bilateral upper limbs: Secondary | ICD-10-CM

## 2020-08-09 DIAGNOSIS — M1990 Unspecified osteoarthritis, unspecified site: Secondary | ICD-10-CM

## 2020-08-09 DIAGNOSIS — R739 Hyperglycemia, unspecified: Secondary | ICD-10-CM

## 2020-08-09 DIAGNOSIS — R4182 Altered mental status, unspecified: Secondary | ICD-10-CM

## 2020-08-09 MED ORDER — CYANOCOBALAMIN 1000 MCG/ML IJ SOLN
1000.0000 ug | INTRAMUSCULAR | Status: DC
  Administered 2020-08-09 – 2020-10-24 (×2): 1000 ug via INTRAMUSCULAR

## 2020-08-09 NOTE — Patient Instructions (Addendum)
Your minimental status score is 28/30. Recommend healthy diet(low sugar/low cholesterlo) and regular exercise. If memory issues change or worsen refer to neurologist.  I do thinks you have combination of carpel tunnel syndrome and osteoarthritis. Work up declined presently. If numbness worse or changes let us know. Work up and referral declined. Cspine xray offered but declined.  Htn. Well controlled. Continue lisinopril.  For low b12.. B12 injection today.  Follow up in 3 months or as needed

## 2020-08-09 NOTE — Progress Notes (Signed)
Subjective:    Patient ID: Brandon Mason, male    DOB: [birth year], 87 y.o.   MRN: [id number]  HPI Pt in for follow up.  Pt is for some concerns about his memory. Pt wife thinks he has potential memory issues. Pt does not thinks that is the case. Pt mini mental status exam score was 28/30.  Pt pays bills and balances check book. He does not get lost driving.    Pt sometimes tells him to get something at store and he will forget after 3 days.  Facial recognition at times can be slow but mask make it difficult.  Pt states gradual onset of numbness to both hands. Pt also has some slight discomfort on gripping to both hands. No neck pain. No radicular pain. Last 2 gfr were in good range.  Pt wants to curb his appetiite. States has good appetitie.  Pt lipid panel in 2018 was well controlled.   Review of Systems  Constitutional: Negative for chills, fatigue and fever.  Respiratory: Negative for cough, chest tightness, shortness of breath and wheezing.   Cardiovascular: Negative for chest pain and palpitations.  Gastrointestinal: Negative for abdominal pain.  Musculoskeletal: Negative for back pain and myalgias.  Skin: Negative for rash.  Neurological: Negative for dizziness, numbness and headaches.  Hematological: Negative for adenopathy. Does not bruise/bleed easily.  Psychiatric/Behavioral: Negative for behavioral problems and confusion.   Past Medical History:  Diagnosis Date  . Abnormality of gait 02/09/2013  . Arthritis   . Cancer ([place])    basal cell skin cancer  . Cardiomegaly   . Chronic kidney disease    BPH  . Chronic leg pain   . Hypersomnia   . Hypertension   . Hypoxia    pulmonary  . Neuromuscular disorder (HCC)    restless leg syndrome   . Nocturia   . Prostate disorder   . Restless legs syndrome (RLS) 02/09/2013  . Restless legs syndrome with nocturnal myoclonus   . RLS (restless legs syndrome)    x 40 years  . Shortness of breath    with exertion    . Sleep apnea    uses cpap  . Stroke [facility])    minor stroke in 2003 - no lasting side effects  . UARS (upper airway resistance syndrome) 06/28/2014     Social History   Socioeconomic History  . Marital status: Married    Spouse name: [name]  . Number of children: 5  . Years of education: 22  . Highest education level: Not on file  Occupational History  . 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 Smoking status: Never Smoker  . Smokeless tobacco: Never Used  Substance and Sexual Activity  . Alcohol use: Yes    Alcohol/week: 2.0 standard drinks    Types: 2 Cans of beer per week    Comment: weekly  . Drug use: No  . Sexual activity: Not on file  Other Topics Concern  . Not on file  Social History Narrative   Patient lives at home with his wife [name].   Patient is retired.    Patient has 5 children.   Patient has a high school education.   Patient is right-handed.   Patient drinks very little caffeine.   Social Determinants of Health   Financial Resource Strain: Not on file  Food Insecurity: Not on file  Transportation Needs: Not on file  Physical Activity: Not on  file  Stress: Not on file  Social Connections: Not on file  Intimate Partner Violence: Not on file    Past Surgical History:  Procedure Laterality Date  . ANTERIOR CERVICAL DECOMP/DISCECTOMY FUSION N/A 03/29/2016   Procedure: ANTERIOR CERVICAL DECOMPRESSION/DISCECTOMY FUSION CERVICAL THREE- CERVICAL FOUR;  Surgeon: Ashok Pall, MD;  Location: [place] [facility];  Service: Neurosurgery;  Laterality: N/A;  . BACK SURGERY  2003   L4-5  . CATARACT EXTRACTION Bilateral   . HERNIA REPAIR     inguinal hernia  . TRANSURETHRAL PROSTATECTOMY WITH GYRUS INSTRUMENTS  04/28/2012   Procedure: TRANSURETHRAL PROSTATECTOMY WITH GYRUS INSTRUMENTS;  Surgeon: Fredricka Bonine, MD;  Location: [facility];  Service: Urology;  Laterality: N/A;  . TRANSURETHRAL RESECTION OF PROSTATE  03/24/2012    Procedure: TRANSURETHRAL RESECTION OF THE PROSTATE (TURP);  Surgeon: Fredricka Bonine, MD;  Location: [facility];  Service: Urology;  Laterality: N/A;  with gyrus ,Greenlight PVP laser of Prostate    Family History  Problem Relation Age of Onset  . Restless legs syndrome Mother   . Restless legs syndrome Daughter   . Esophageal cancer Neg Hx   . Colon cancer Neg Hx   . Pancreatic cancer Neg Hx   . Stomach cancer Neg Hx     No Known Allergies  Current Outpatient Medications on File Prior to Visit  Medication Sig Dispense Refill  . amLODipine (NORVASC) 10 MG tablet TAKE 1 TABLET BY MOUTH  DAILY 90 tablet 3  . finasteride (PROSCAR) 5 MG tablet TAKE 1 TABLET BY MOUTH  DAILY 90 tablet 3  . lisinopril (ZESTRIL) 20 MG tablet TAKE 1 TABLET BY MOUTH  DAILY 90 tablet 3  . magic mouthwash SOLN Take 5 mLs by mouth 4 (four) times daily as needed for mouth pain. 200 mL 0  . mupirocin ointment (BACTROBAN) 2 % APPLY  OINTMENT TOPICALLY TO AFFECTED AREA TWICE DAILY 22 g 0  . nystatin cream (MYCOSTATIN) APPLY  CREAM TOPICALLY TO AFFECTED AREA TWICE DAILY 30 g 0  . pregabalin (LYRICA) 150 MG capsule Take 150 mg by mouth 2 (two) times daily.    Marland Kitchen rOPINIRole (REQUIP) 4 MG tablet Take 1 tablet (4 mg total) by mouth at bedtime. 30 tablet 5  . traMADol (ULTRAM) 50 MG tablet Take 1 tablet (50 mg total) by mouth every 8 (eight) hours as needed. for pain 90 tablet 5   No current facility-administered medications on file prior to visit.    BP 120/86 (BP Location: Right Arm, Patient Position: Sitting, Cuff Size: Normal)   Pulse 86   Temp 97.8 F (36.6 C) (Oral)   Resp 18   Ht 5\' 9"  (1.753 m)   Wt 188 lb 12.8 oz (85.6 kg)   SpO2 94%   BMI 27.88 kg/m         Objective:   Physical Exam  General Mental Status- Alert. General Appearance- Not in acute distress.   Skin General: Color- Normal Color. Moisture- Normal Moisture.  Neck Carotid Arteries- Normal color. Moisture- Normal Moisture. No  carotid bruits. No JVD.  Chest and Lung Exam Auscultation: Breath Sounds:-Normal.  Cardiovascular Auscultation:Rythm- Regular. Murmurs & Other Heart Sounds:Auscultation of the heart reveals- No Murmurs.  Abdomen Inspection:-Inspeection Normal. Palpation/Percussion:Note:No mass. Palpation and Percussion of the abdomen reveal- Non Tender, Non Distended + BS, no rebound or guarding.    Neurologic Cranial Nerve exam:- CN III-XII intact(No nystagmus), symmetric smile. Strength:- 5/5 equal and symmetric strength both upper and lower extremities. Negative phalens signs.  Upper ext- no numbness on wrist manipulation.      Assessment & Plan:  Your minimental status score is 28/30. Recommend healthy diet(low sugar/low cholesterlo) and regular exercise. If memory issues change or worsen refer to neurologist.  I do thinks you have combination of carpel tunnel syndrome and osteoarthritis. Work up declined presently. If numbness worse or changes let us know. Work up and referral declined. Cspine xray offered but declined.  Htn. Well controlled. Continue lisinopril.  For low b12.. B12 injection today.  Follow up in 3 months or as needed  [employer], [employer]

## 2020-08-19 ENCOUNTER — Other Ambulatory Visit: Payer: Self-pay | Admitting: Medical

## 2020-08-22 ENCOUNTER — Other Ambulatory Visit: Payer: Self-pay | Admitting: Medical

## 2020-09-21 ENCOUNTER — Other Ambulatory Visit: Payer: Self-pay

## 2020-09-21 ENCOUNTER — Telehealth: Payer: Self-pay | Admitting: *Deleted

## 2020-09-21 ENCOUNTER — Ambulatory Visit (INDEPENDENT_AMBULATORY_CARE_PROVIDER_SITE_OTHER): Payer: Medicare Other | Admitting: *Deleted

## 2020-09-21 DIAGNOSIS — E538 Deficiency of other specified B group vitamins: Secondary | ICD-10-CM | POA: Diagnosis not present

## 2020-09-21 MED ORDER — CYANOCOBALAMIN 1000 MCG/ML IJ SOLN
1000.0000 ug | Freq: Once | INTRAMUSCULAR | Status: AC
  Administered 2020-09-21: 1000 ug via INTRAMUSCULAR

## 2020-09-21 NOTE — Telephone Encounter (Signed)
Agree he needs to be seen. Various causes possible. Can't guess/speculate on cause. Needs to be seen in office for interview and exam.

## 2020-09-21 NOTE — Telephone Encounter (Signed)
FYI  Patient came in for nurse visit and stated that when he swallows it feels like he gets strangled and what could that be? Is it just apart of getting old?   I advised that he needs to be evaluated and offered an appointment, but patient declined.

## 2020-09-21 NOTE — Progress Notes (Addendum)
Patient here today for b12 injection per Mackie Pai, [name].  I asked patient why he did not come back for his weekly injection after 08/09/20 visit and he stated that he was not called about an appointment.    Spoke with Saguier, Percell Miller, [name] and he stated to give injection today and have patient come back next week and then do monthly.  We will recheck level in 5 months.  Patient would like to know if he can just take the pill.  Advised to discuss at recheck in 5 months.  Injection given in left deltoid and patient tolerated well.  Patient scheduled for next week 09/28/20.  Advised then after that it is monthly.  Mackie Pai, [name]

## 2020-09-22 NOTE — Telephone Encounter (Signed)
Appointment scheduled for next Thursday , cancelled NV for last weekly b12 will be given at OV then monthly injections

## 2020-09-28 ENCOUNTER — Other Ambulatory Visit: Payer: Self-pay

## 2020-09-28 ENCOUNTER — Ambulatory Visit (INDEPENDENT_AMBULATORY_CARE_PROVIDER_SITE_OTHER): Payer: Medicare Other | Admitting: Medical

## 2020-09-28 ENCOUNTER — Ambulatory Visit: Payer: Medicare Other

## 2020-09-28 VITALS — BP 128/78 | HR 79 | Resp 18 | Ht 69.0 in | Wt 183.2 lb

## 2020-09-28 DIAGNOSIS — I10 Essential (primary) hypertension: Secondary | ICD-10-CM | POA: Diagnosis not present

## 2020-09-28 DIAGNOSIS — R1310 Dysphagia, unspecified: Secondary | ICD-10-CM | POA: Diagnosis not present

## 2020-09-28 DIAGNOSIS — Z7185 Encounter for immunization safety counseling: Secondary | ICD-10-CM | POA: Diagnosis not present

## 2020-09-28 DIAGNOSIS — E538 Deficiency of other specified B group vitamins: Secondary | ICD-10-CM

## 2020-09-28 MED ORDER — CYANOCOBALAMIN 1000 MCG/ML IJ SOLN
1000.0000 ug | Freq: Once | INTRAMUSCULAR | Status: AC
  Administered 2020-09-28: 1000 ug via INTRAMUSCULAR

## 2020-09-28 MED ORDER — FAMOTIDINE 20 MG PO TABS: 20.0000 mg | ORAL_TABLET | Freq: Every day | ORAL | 0 refills | Status: DC

## 2020-09-28 NOTE — Addendum Note (Signed)
Addended by: Anabel Halon on: 09/28/2020 09:34 AM   Modules accepted: Orders

## 2020-09-28 NOTE — Patient Instructions (Addendum)
For mild intermittent difficulty swallowing can give famotidine in event you have some associated mild reflux. Discussed referral to GI MD if continue/significant as egd could reveal possible esophageal stenosis. Referral declined but if you change your mind let me know.   Bp well controlled. Continue amlodipine and lisinopril  For low b12 we gave b12 injection today. Repeat October 24, 2020.  Do recommend that you get covid boost between now and oct. Early fall probably best time to get. Get vaccine before local spike in cases. But sooner may be needed if infection rates change or if traveling to area with high infection rate.  Follow up one month or as needed

## 2020-09-28 NOTE — Progress Notes (Signed)
Subjective:    Patient ID: Brandon Mason, male    DOB: 1925/08/10, 87 y.o.   MRN: [id number]  HPI   Pt in for follow up.  Pt has question on covid vaccine. He has had 3 vaccines already. We discussed him getting 4th vaccine between now and early fall.   Pt states has some intermittent difficulty swallowing. Slight more difficulty at times random. But no choking episodes. Pt not having any belching or heart burn. He states swallowing difficulty happens 2-3 times a week.   Pt bp is well controlled.  Pt has low b12. He just got b12 injection today.    Review of Systems  Constitutional: Negative for chills, fatigue and fever.  Respiratory: Negative for cough, chest tightness, shortness of breath and wheezing.   Cardiovascular: Negative for chest pain and palpitations.  Gastrointestinal: Negative for abdominal pain, blood in stool, diarrhea, rectal pain and vomiting.  Genitourinary: Negative for dysuria and frequency.  Musculoskeletal: Negative for back pain.  Neurological: Negative for dizziness, seizures, syncope, weakness and light-headedness.  Hematological: Negative for adenopathy. Does not bruise/bleed easily.  Psychiatric/Behavioral: Negative for behavioral problems and decreased concentration. The patient is not nervous/anxious.     Past Medical History:  Diagnosis Date  . Abnormality of gait 02/09/2013  . Arthritis   . Cancer ([place])    basal cell skin cancer  . Cardiomegaly   . Chronic kidney disease    BPH  . Chronic leg pain   . Hypersomnia   . Hypertension   . Hypoxia    pulmonary  . Neuromuscular disorder (HCC)    restless leg syndrome   . Nocturia   . Prostate disorder   . Restless legs syndrome (RLS) 02/09/2013  . Restless legs syndrome with nocturnal myoclonus   . RLS (restless legs syndrome)    x 40 years  . Shortness of breath    with exertion   . Sleep apnea    uses cpap  . Stroke [facility])    minor stroke in 2003 - no lasting side effects  . UARS  (upper airway resistance syndrome) 06/28/2014     Social History   Socioeconomic History  . Marital status: Married    Spouse name: [name]  . Number of children: 5  . Years of education: 32  . Highest education level: Not on file  Occupational History  . 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 Smoking status: Never Smoker  . Smokeless tobacco: Never Used  Substance and Sexual Activity  . Alcohol use: Yes    Alcohol/week: 2.0 standard drinks    Types: 2 Cans of beer per week    Comment: weekly  . Drug use: No  . Sexual activity: Not on file  Other Topics Concern  . Not on file  Social History Narrative   Patient lives at home with his wife [name].   Patient is retired.    Patient has 5 children.   Patient has a high school education.   Patient is right-handed.   Patient drinks very little caffeine.   Social Determinants of Health   Financial Resource Strain: Not on file  Food Insecurity: Not on file  Transportation Needs: Not on file  Physical Activity: Not on file  Stress: Not on file  Social Connections: Not on file  Intimate Partner Violence: Not on file    Past Surgical History:  Procedure Laterality Date  . ANTERIOR CERVICAL DECOMP/DISCECTOMY  FUSION N/A 03/29/2016   Procedure: ANTERIOR CERVICAL DECOMPRESSION/DISCECTOMY FUSION CERVICAL THREE- CERVICAL FOUR;  Surgeon: Ashok Pall, MD;  Location: [place] [facility];  Service: Neurosurgery;  Laterality: N/A;  . BACK SURGERY  2003   L4-5  . CATARACT EXTRACTION Bilateral   . HERNIA REPAIR     inguinal hernia  . TRANSURETHRAL PROSTATECTOMY WITH GYRUS INSTRUMENTS  04/28/2012   Procedure: TRANSURETHRAL PROSTATECTOMY WITH GYRUS INSTRUMENTS;  Surgeon: Fredricka Bonine, MD;  Location: [facility];  Service: Urology;  Laterality: N/A;  . TRANSURETHRAL RESECTION OF PROSTATE  03/24/2012   Procedure: TRANSURETHRAL RESECTION OF THE PROSTATE (TURP);  Surgeon: Fredricka Bonine, MD;   Location: [facility];  Service: Urology;  Laterality: N/A;  with gyrus ,Greenlight PVP laser of Prostate    Family History  Problem Relation Age of Onset  . Restless legs syndrome Mother   . Restless legs syndrome Daughter   . Esophageal cancer Neg Hx   . Colon cancer Neg Hx   . Pancreatic cancer Neg Hx   . Stomach cancer Neg Hx     No Known Allergies  Current Outpatient Medications on File Prior to Visit  Medication Sig Dispense Refill  . amLODipine (NORVASC) 10 MG tablet TAKE 1 TABLET BY MOUTH  DAILY 90 tablet 3  . finasteride (PROSCAR) 5 MG tablet TAKE 1 TABLET BY MOUTH  DAILY 90 tablet 3  . lisinopril (ZESTRIL) 20 MG tablet TAKE 1 TABLET BY MOUTH  DAILY 90 tablet 3  . magic mouthwash SOLN Take 5 mLs by mouth 4 (four) times daily as needed for mouth pain. 200 mL 0  . mupirocin ointment (BACTROBAN) 2 % APPLY  OINTMENT TOPICALLY TO AFFECTED AREA TWICE DAILY 22 g 0  . nystatin cream (MYCOSTATIN) APPLY  CREAM TOPICALLY TO AFFECTED AREA TWICE DAILY 30 g 0  . pregabalin (LYRICA) 150 MG capsule Take 150 mg by mouth 2 (two) times daily.    Marland Kitchen rOPINIRole (REQUIP) 4 MG tablet TAKE 1 TABLET BY MOUTH AT  BEDTIME 90 tablet 3  . traMADol (ULTRAM) 50 MG tablet Take 1 tablet (50 mg total) by mouth every 8 (eight) hours as needed. for pain 90 tablet 5   Current Facility-Administered Medications on File Prior to Visit  Medication Dose Route Frequency Provider Last Rate Last Admin  . cyanocobalamin ((VITAMIN B-12)) injection 1,000 mcg  1,000 mcg Intramuscular Q30 days Mackie Pai, [name]   1,000 mcg at 08/09/20 1350    BP 128/78   Pulse 79   Resp 18   Ht 5\' 9"  (1.753 m)   Wt 183 lb 3.2 oz (83.1 kg)   SpO2 93%   BMI 27.05 kg/m       Objective:   Physical Exam  General Mental Status- Alert. General Appearance- Not in acute distress.   Skin General: Color- Normal Color. Moisture- Normal Moisture.  Neck Carotid Arteries- Normal color. Moisture- Normal Moisture. No carotid bruits. No  JVD. No thyomegaly/  Chest and Lung Exam Auscultation: Breath Sounds:-Normal.  Cardiovascular Auscultation:Rythm- Regular. Murmurs & Other Heart Sounds:Auscultation of the heart reveals- No Murmurs.  Abdomen Inspection:-Inspeection Normal. Palpation/Percussion:Note:No mass. Palpation and Percussion of the abdomen reveal- Non Tender, Non Distended + BS, no rebound or guarding.   Neurologic Cranial Nerve exam:- CN III-XII intact(No nystagmus), symmetric smile. Strength:- 5/5 equal and symmetric strength both upper and lower extremities.      Assessment & Plan:  For mild intermittent difficulty swallowing can give famotidine in event you have some associated mild reflux. Discussed  referral to GI MD if continue/significant as egd could reveal possible esophageal stenosis. Referral declined but if you change your mind let me know.   Bp well controlled. Continue amlodipine and lisinopril  For low b12 we gave b12 injection today. Repeat October 24, 2020.  Do recommend that you get covid boost between now and oct. Early fall probably best time to get. Get vaccine before local spike in cases. But sooner may be needed if infection rates change or if traveling to area with high infection rate.  Follow up one month or as needed  [employer], [name]

## 2020-10-24 ENCOUNTER — Other Ambulatory Visit: Payer: Self-pay

## 2020-10-24 ENCOUNTER — Ambulatory Visit (INDEPENDENT_AMBULATORY_CARE_PROVIDER_SITE_OTHER): Payer: Medicare Other

## 2020-10-24 DIAGNOSIS — E538 Deficiency of other specified B group vitamins: Secondary | ICD-10-CM

## 2020-10-24 MED ORDER — CYANOCOBALAMIN 1000 MCG/ML IJ SOLN: 1000.0000 ug | Freq: Once | INTRAMUSCULAR | Status: DC

## 2020-10-24 NOTE — Progress Notes (Addendum)
Pt here for monthly B12 injection per [name] Saguier  B12 1000mcg given IM left deltoid, and pt tolerated injection well.  Next B12 injection scheduled for next month  [name] Saguier, [name]   

## 2020-11-05 ENCOUNTER — Other Ambulatory Visit: Payer: Self-pay | Admitting: Medical

## 2020-11-06 ENCOUNTER — Telehealth: Payer: Self-pay

## 2020-11-06 NOTE — Telephone Encounter (Signed)
Hx of very challenging restless leg syndrome. He can get scheduled for virtual visit on Wednesday morning . Not my preference but if just does not want to come in etc then video.  I have done video visits with his wife. Daughter helped them out with technology.   Can you get him scheduled and can daughter help him

## 2020-11-06 NOTE — Telephone Encounter (Signed)
Nurse Assessment Nurse: Levada Dy, RN, Marshell Levan Date/Time Brandon Mason Time): 11/03/2020 9:45:08 PM Confirm and document reason for call. If symptomatic, describe symptoms. ---Caller is calling to leave message to his Dr. caller states he has taken prescription for restless legs, but he has been unable to sit for 9 hours, states prescription no longer works and he is tired. Caller states that he cant go anywhere to be evaluated and denies triage at this time. Caller states that he will ball back Monday during normal business hours to discuss with PCP about a treatment plan. Does the patient have any new or worsening symptoms? ---Yes Will a triage be completed? ---No Select reason for no triage. ---Patient declined Please document clinical information provided and list any resource used. ---Caller is calling to leave message to his Dr. caller states he has taken prescription for restless legs, but he has been unable to sit for 9 hours, states prescription no longer works and he is tired. Caller states that he cant go anywhere to be evaluated and denies triage at this time. Caller states that he will ball back Monday during normal business hours to discuss with PCP about a treatment plan. Disp. Time Brandon Mason Time) Disposition Final User 11/03/2020 9:50:41 PM Clinical Call Yes Levada Dy, RN, Marshell Levan

## 2020-11-07 NOTE — Telephone Encounter (Signed)
Pt is scheduled for an office visit

## 2020-11-07 NOTE — Telephone Encounter (Signed)
Called pt and stated he would have to call our office back because he doesn't know his daughter's schedule and she will have to bring the laptop

## 2020-11-08 ENCOUNTER — Other Ambulatory Visit: Payer: Self-pay

## 2020-11-08 ENCOUNTER — Ambulatory Visit (INDEPENDENT_AMBULATORY_CARE_PROVIDER_SITE_OTHER): Payer: Medicare Other | Admitting: Medical

## 2020-11-08 VITALS — BP 118/61 | HR 87 | Resp 18 | Ht 69.0 in | Wt 183.4 lb

## 2020-11-08 DIAGNOSIS — G629 Polyneuropathy, unspecified: Secondary | ICD-10-CM | POA: Diagnosis not present

## 2020-11-08 NOTE — Progress Notes (Signed)
Subjective:    Patient ID: Brandon Mason, male    DOB: [birth year], 87 y.o.   MRN: [id number]  HPI  Pt in for follow up.  Pt states on Saturday night he had severe restless legs and sensation of burning. Pt had this more than usual. He states more pronounced compared to prior. Pt has hx of restless leg and neuropathy. He is on requip and tramadol.  Pt has had extreme difficulty in past.  Pt is on lyrica, requip and tramadol.   Pt states since Saturday night his symptoms are tolerable.   Pt taking 4 mg requip at night.   Pt has chronic dependant edema. He uses compresses socks occasionally.  Review of Systems  Constitutional: Negative for chills, fatigue and fever.  Respiratory: Negative for cough, chest tightness, shortness of breath and wheezing.   Cardiovascular: Negative for chest pain and palpitations.  Gastrointestinal: Negative for abdominal pain, blood in stool, nausea and vomiting.  Musculoskeletal: Negative for back pain, gait problem and joint swelling.  Skin: Negative for rash.  Neurological: Negative for dizziness and numbness.  Psychiatric/Behavioral: Negative for behavioral problems and confusion.    Past Medical History:  Diagnosis Date  . Abnormality of gait 02/09/2013  . Arthritis   . Cancer ([place])    basal cell skin cancer  . Cardiomegaly   . Chronic kidney disease    BPH  . Chronic leg pain   . Hypersomnia   . Hypertension   . Hypoxia    pulmonary  . Neuromuscular disorder (HCC)    restless leg syndrome   . Nocturia   . Prostate disorder   . Restless legs syndrome (RLS) 02/09/2013  . Restless legs syndrome with nocturnal myoclonus   . RLS (restless legs syndrome)    x 40 years  . Shortness of breath    with exertion   . Sleep apnea    uses cpap  . Stroke [facility])    minor stroke in 2003 - no lasting side effects  . UARS (upper airway resistance syndrome) 06/28/2014     Social History   Socioeconomic History  . Marital status: Married     Spouse name: [name]  . Number of children: 5  . Years of education: 60  . Highest education level: Not on file  Occupational History  . 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 Smoking status: Never Smoker  . Smokeless tobacco: Never Used  Substance and Sexual Activity  . Alcohol use: Yes    Alcohol/week: 2.0 standard drinks    Types: 2 Cans of beer per week    Comment: weekly  . Drug use: No  . Sexual activity: Not on file  Other Topics Concern  . Not on file  Social History Narrative   Patient lives at home with his wife [name].   Patient is retired.    Patient has 5 children.   Patient has a high school education.   Patient is right-handed.   Patient drinks very little caffeine.   Social Determinants of Health   Financial Resource Strain: Not on file  Food Insecurity: Not on file  Transportation Needs: Not on file  Physical Activity: Not on file  Stress: Not on file  Social Connections: Not on file  Intimate Partner Violence: Not on file    Past Surgical History:  Procedure Laterality Date  . ANTERIOR CERVICAL DECOMP/DISCECTOMY FUSION N/A 03/29/2016   Procedure: ANTERIOR CERVICAL DECOMPRESSION/DISCECTOMY  FUSION CERVICAL THREE- CERVICAL FOUR;  Surgeon: Ashok Pall, MD;  Location: [place] [facility];  Service: Neurosurgery;  Laterality: N/A;  . BACK SURGERY  2003   L4-5  . CATARACT EXTRACTION Bilateral   . HERNIA REPAIR     inguinal hernia  . TRANSURETHRAL PROSTATECTOMY WITH GYRUS INSTRUMENTS  04/28/2012   Procedure: TRANSURETHRAL PROSTATECTOMY WITH GYRUS INSTRUMENTS;  Surgeon: Fredricka Bonine, MD;  Location: [facility];  Service: Urology;  Laterality: N/A;  . TRANSURETHRAL RESECTION OF PROSTATE  03/24/2012   Procedure: TRANSURETHRAL RESECTION OF THE PROSTATE (TURP);  Surgeon: Fredricka Bonine, MD;  Location: [facility];  Service: Urology;  Laterality: N/A;  with gyrus ,Greenlight PVP laser of Prostate    Family History   Problem Relation Age of Onset  . Restless legs syndrome Mother   . Restless legs syndrome Daughter   . Esophageal cancer Neg Hx   . Colon cancer Neg Hx   . Pancreatic cancer Neg Hx   . Stomach cancer Neg Hx     No Known Allergies  Current Outpatient Medications on File Prior to Visit  Medication Sig Dispense Refill  . amLODipine (NORVASC) 10 MG tablet TAKE 1 TABLET BY MOUTH  DAILY 90 tablet 3  . famotidine (PEPCID) 20 MG tablet Take 1 tablet by mouth once daily 30 tablet 0  . finasteride (PROSCAR) 5 MG tablet TAKE 1 TABLET BY MOUTH  DAILY 90 tablet 3  . lisinopril (ZESTRIL) 20 MG tablet TAKE 1 TABLET BY MOUTH  DAILY 90 tablet 3  . magic mouthwash SOLN Take 5 mLs by mouth 4 (four) times daily as needed for mouth pain. 200 mL 0  . mupirocin ointment (BACTROBAN) 2 % APPLY  OINTMENT TOPICALLY TO AFFECTED AREA TWICE DAILY 22 g 0  . nystatin cream (MYCOSTATIN) APPLY  CREAM TOPICALLY TO AFFECTED AREA TWICE DAILY 30 g 0  . pregabalin (LYRICA) 150 MG capsule Take 150 mg by mouth 2 (two) times daily.    Marland Kitchen rOPINIRole (REQUIP) 4 MG tablet TAKE 1 TABLET BY MOUTH AT  BEDTIME 90 tablet 3  . traMADol (ULTRAM) 50 MG tablet Take 1 tablet (50 mg total) by mouth every 8 (eight) hours as needed. for pain 90 tablet 5   Current Facility-Administered Medications on File Prior to Visit  Medication Dose Route Frequency Provider Last Rate Last Admin  . cyanocobalamin ((VITAMIN B-12)) injection 1,000 mcg  1,000 mcg Intramuscular Q30 days Mackie Pai, [name]   1,000 mcg at 10/24/20 1111  . cyanocobalamin ((VITAMIN B-12)) injection 1,000 mcg  1,000 mcg Intramuscular Once Syndey Jaskolski, Percell Miller, [name]        BP 118/61   Pulse 87   Resp 18   Ht 5\' 9"  (1.753 m)   Wt 183 lb 6.4 oz (83.2 kg)   SpO2 95%   BMI 27.08 kg/m       Objective:   Physical Exam  General- No acute distress. Pleasant patient. Neck- Full range of motion, no jvd Lungs- Clear, even and unlabored. Heart- regular rate and  rhythm. Neurologic- CNII- XII grossly intact.  Lower ext- chronic bilateral pedal edema. Negative homas signs. No warmth. No tenderness.      Assessment & Plan:  You have history of restless leg syndrome and neuropathy.  Recently combination of described symptoms severe on Sunday.  Now your symptoms are under control.  I talked with pharmacist and he did confirm that Requip 4 mg is max dose.  So do to stay on current 4 mg dose(not to  exceed).  Continue Lyrica at current dose as well.  I do think best option presently is to continue tramadol 50 mg every 3 times daily.  If he were to have extreme signs and symptoms as described other  night then you could try 1 additional tramadol only.  In the past you report no sedation side effects of tramadol but sedation is possibility.  Make sure you gain balance before ambulating.  Let me know if you have to resort to this.  Hopefully Sunday night was rare event and willnot  reccur.  For chronic lower extremity pedal edema continue to elevate your legs and recommend using your compression stockings.  Follow-up in 4 to 6 weeks or as needed.  Mackie Pai, [name]   Time spent with patient today was 30  minutes which consisted of chart review, discussing diagnosis,  Treatment discussion and documentation.

## 2020-11-08 NOTE — Patient Instructions (Signed)
You have history of restless leg syndrome and neuropathy.  Recently combination of described symptoms severe on Sunday.  Now your symptoms are under control.  I talked with pharmacist and he did confirm that Requip 4 mg is max dose.  So do to stay on current 4 mg dose(not to exceed).  Continue Lyrica at current dose as well.  I do think best option presently is to continue tramadol 50 mg every 3 times daily.  If he were to have extreme signs and symptoms as described other  night then you could try 1 additional tramadol only.  In the past you report no sedation side effects of tramadol but sedation is possibility.  Make sure you gain balance before ambulating.  Let me know if you have to resort to this.  Hopefully Sunday night was rare event and willnot  reccur.  For chronic lower extremity pedal edema continue to elevate your legs and recommend using your compression stockings.  Follow-up in 4 to 6 weeks or as needed.

## 2020-11-23 ENCOUNTER — Ambulatory Visit (INDEPENDENT_AMBULATORY_CARE_PROVIDER_SITE_OTHER): Payer: Medicare Other | Admitting: *Deleted

## 2020-11-23 ENCOUNTER — Other Ambulatory Visit: Payer: Self-pay

## 2020-11-23 DIAGNOSIS — E538 Deficiency of other specified B group vitamins: Secondary | ICD-10-CM | POA: Diagnosis not present

## 2020-11-23 MED ORDER — CYANOCOBALAMIN 1000 MCG/ML IJ SOLN
1000.0000 ug | Freq: Once | INTRAMUSCULAR | Status: AC
  Administered 2020-11-23: 1000 ug via INTRAMUSCULAR

## 2020-11-23 NOTE — Progress Notes (Addendum)
Pt here for monthly B12 injection per Mackie Pai   B12 1053mcg given IM left deltoid, and pt tolerated injection well.   Originally order was for b12 weekly for 4 weeks, but was done monthly.  Patient was due for follow up from 11/08/20 (4-6 weeks).  Appointment made for 12/22/20 for follow up and to possibly recheck b12.  Mackie Pai, [name]

## 2020-12-22 ENCOUNTER — Other Ambulatory Visit: Payer: Self-pay

## 2020-12-22 ENCOUNTER — Ambulatory Visit (INDEPENDENT_AMBULATORY_CARE_PROVIDER_SITE_OTHER): Payer: Medicare Other | Admitting: Medical

## 2020-12-22 VITALS — BP 132/67 | HR 74 | Resp 18 | Ht 69.0 in | Wt 183.0 lb

## 2020-12-22 DIAGNOSIS — G2581 Restless legs syndrome: Secondary | ICD-10-CM | POA: Diagnosis not present

## 2020-12-22 DIAGNOSIS — E538 Deficiency of other specified B group vitamins: Secondary | ICD-10-CM

## 2020-12-22 DIAGNOSIS — G629 Polyneuropathy, unspecified: Secondary | ICD-10-CM

## 2020-12-22 DIAGNOSIS — S0181XA Laceration without foreign body of other part of head, initial encounter: Secondary | ICD-10-CM

## 2020-12-22 DIAGNOSIS — T148XXA Other injury of unspecified body region, initial encounter: Secondary | ICD-10-CM

## 2020-12-22 DIAGNOSIS — Z23 Encounter for immunization: Secondary | ICD-10-CM

## 2020-12-22 DIAGNOSIS — I10 Essential (primary) hypertension: Secondary | ICD-10-CM | POA: Diagnosis not present

## 2020-12-22 LAB — VITAMIN B12: Vitamin B-12: 330 pg/mL (ref 211–911)

## 2020-12-22 MED ORDER — CYANOCOBALAMIN 1000 MCG/ML IJ SOLN
1000.0000 ug | Freq: Once | INTRAMUSCULAR | Status: AC
Start: 2020-12-22 — End: 2020-12-22
  Administered 2020-12-22: 1000 ug via INTRAMUSCULAR

## 2020-12-22 NOTE — Patient Instructions (Addendum)
Your bp is well controlled. Continue current bp med regimen.  For restless leg and neuropathy continue tramadol.  For low b12 get b12 level today. If b12 level increasing to mid-high range have hopes neuropathy might improve.  For recent facial abrasion /lacerate well healed by secondary intention will update tdap.  Follow up date to be determined after lab review.

## 2020-12-22 NOTE — Progress Notes (Signed)
Subjective:    Patient ID: Brandon Mason, male    DOB: 1925/10/14, 87 y.o.   MRN: [id number]  HPI  Pt in for follow up.   Pt states recently working in yard. He was pushing wheel barrow and he fell in that direction. No loss of consciousness. Pt has small abrasion/shallow laceration at that time. This occurred last Friday.  Pt put topical antibiotic neosporin on the abrasion area.  Restless leg symptoms and neuropathy. Pt is on tramadol. Does help manage but still has symptoms.  Pt does have hx of low b12 recently. Has been on weekly b12 injections.  Htn hx. BP controlled with current regimen.      Review of Systems  Constitutional:  Negative for chills, fatigue and fever.  HENT:  Negative for congestion, ear discharge and ear pain.   Respiratory:  Negative for cough, chest tightness, shortness of breath and wheezing.   Cardiovascular:  Negative for chest pain and palpitations.  Gastrointestinal:  Negative for abdominal pain, diarrhea and vomiting.  Genitourinary:  Negative for dysuria.  Musculoskeletal:  Negative for back pain, neck pain and neck stiffness.       See hpi. Restless leg and neuropathy.  Skin:  Negative for rash.  Neurological:  Negative for dizziness.       Lower leg neuropathy and restless leg.  Hematological:  Negative for adenopathy. Does not bruise/bleed easily.  Psychiatric/Behavioral:  Negative for behavioral problems and decreased concentration.     Past Medical History:  Diagnosis Date   Abnormality of gait 02/09/2013   Arthritis    Cancer (HCC)    basal cell skin cancer   Cardiomegaly    Chronic kidney disease    BPH   Chronic leg pain    Hypersomnia    Hypertension    Hypoxia    pulmonary   Neuromuscular disorder (HCC)    restless leg syndrome    Nocturia    Prostate disorder    Restless legs syndrome (RLS) 02/09/2013   Restless legs syndrome with nocturnal myoclonus    RLS (restless legs syndrome)    x 40 years   Shortness of  breath    with exertion    Sleep apnea    uses cpap   Stroke ([place])    minor stroke in 2003 - no lasting side effects   UARS (upper airway resistance syndrome) 06/28/2014     Social History   Socioeconomic History   Marital status: Married    Spouse name: Ann   Number of children: 5   Years of education: 12   Highest education level: Not on file  Occupational History   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   Patient is retired.    Patient has 5 children.   Patient has a high school education.   Patient is right-handed.   Patient drinks very little caffeine.   Social Determinants of Health   Financial Resource Strain: Not on file  Food Insecurity: Not on file  Transportation Needs: Not on file  Physical Activity: Not on file  Stress: Not on file  Social Connections: Not on file  Intimate Partner Violence: Not on file    Past Surgical History:  Procedure Laterality Date   ANTERIOR CERVICAL DECOMP/DISCECTOMY FUSION N/A 03/29/2016   Procedure: ANTERIOR CERVICAL DECOMPRESSION/DISCECTOMY FUSION CERVICAL THREE- CERVICAL FOUR;  Surgeon: Ashok Pall, MD;  Location: [place] [facility];  Service: Neurosurgery;  Laterality: N/A;   BACK SURGERY  2003   L4-5   CATARACT EXTRACTION Bilateral    HERNIA REPAIR     inguinal hernia   TRANSURETHRAL PROSTATECTOMY WITH GYRUS INSTRUMENTS  04/28/2012   Procedure: TRANSURETHRAL PROSTATECTOMY WITH GYRUS INSTRUMENTS;  Surgeon: Fredricka Bonine, MD;  Location: [facility];  Service: Urology;  Laterality: N/A;   TRANSURETHRAL RESECTION OF PROSTATE  03/24/2012    Procedure: TRANSURETHRAL RESECTION OF THE PROSTATE (TURP);  Surgeon: Fredricka Bonine, MD;  Location: [facility];  Service: Urology;  Laterality: N/A;  with gyrus ,Greenlight PVP laser of Prostate    Family History  Problem Relation Age of Onset   Restless legs syndrome Mother    Restless legs syndrome Daughter    Esophageal cancer Neg Hx    Colon cancer Neg Hx    Pancreatic cancer Neg Hx    Stomach cancer Neg Hx     No Known Allergies  Current Outpatient Medications on File Prior to Visit  Medication Sig Dispense Refill   amLODipine (NORVASC) 10 MG tablet TAKE 1 TABLET BY MOUTH  DAILY 90 tablet 3   famotidine (PEPCID) 20 MG tablet Take 1 tablet by mouth once daily 30 tablet 0   finasteride (PROSCAR) 5 MG tablet TAKE 1 TABLET BY MOUTH  DAILY 90 tablet 3   lisinopril (ZESTRIL) 20 MG tablet TAKE 1 TABLET BY MOUTH  DAILY 90 tablet 3   magic mouthwash SOLN Take 5 mLs by mouth 4 (four) times daily as needed for mouth pain. 200 mL 0   mupirocin ointment (BACTROBAN) 2 % APPLY  OINTMENT TOPICALLY TO AFFECTED AREA TWICE DAILY 22 g 0   nystatin cream (MYCOSTATIN) APPLY  CREAM TOPICALLY TO AFFECTED AREA TWICE DAILY 30 g 0   pregabalin (LYRICA) 150 MG capsule Take 150 mg by mouth 2 (two) times daily.     rOPINIRole (REQUIP) 4 MG tablet TAKE 1 TABLET BY MOUTH AT  BEDTIME 90 tablet 3   traMADol (ULTRAM) 50 MG tablet Take 1 tablet (50 mg total) by mouth every 8 (eight) hours as needed. for pain 90 tablet 5   Current Facility-Administered Medications on File Prior to Visit  Medication Dose Route Frequency Provider Last Rate Last Admin   cyanocobalamin ((VITAMIN B-12)) injection 1,000 mcg  1,000 mcg Intramuscular Once Anita Mcadory, [name]        BP 132/67   Pulse 74   Resp 18   Ht 5\' 9"  (1.753 m)   Wt 183 lb (83 kg)   SpO2 96%   BMI 27.02 kg/m        Objective:   Physical Exam  General Mental Status- Alert. General Appearance- Not in acute distress.   Skin General: Color- Normal  Color. Moisture- Normal Moisture.  Neck Carotid Arteries- Normal color. Moisture- Normal Moisture. No carotid bruits. No JVD.  Chest and Lung Exam Auscultation: Breath Sounds:-Normal.  Cardiovascular Auscultation:Rythm- Regular. Murmurs & Other Heart Sounds:Auscultation of the heart reveals- No Murmurs.  Abdomen Inspection:-Inspeection Normal. Palpation/Percussion:Note:No mass. Palpation and Percussion of the abdomen reveal- Non Tender, Non Distended +  BS, no rebound or guarding.    Neurologic Cranial Nerve exam:- CN III-XII intact(No nystagmus), symmetric smile. Strength:- 5/5 equal and symmetric strength both upper and lower extremities.   Face- rt side cheek brusing present but no pain over maxillary area/zygomatic arch. Superficial abrasion/laceration well healed by secondary intention.      Assessment & Plan:  Your bp is well controlled. Continue current bp med regimen.  For restless leg and neuropathy continue tramadol.  For low b12 get b12 level today. If b12 level increasing to mid-high range have hopes neuropathy might improve.  For recent facial abrasion /lacerate well healed by secondary intention will update tdap.  Follow up date to be determined after lab review.  Mackie Pai, [name]

## 2020-12-26 DIAGNOSIS — Z23 Encounter for immunization: Secondary | ICD-10-CM | POA: Diagnosis not present

## 2021-01-01 ENCOUNTER — Other Ambulatory Visit: Payer: Self-pay | Admitting: Medical

## 2021-02-08 ENCOUNTER — Telehealth: Payer: Self-pay | Admitting: Medical

## 2021-02-08 ENCOUNTER — Other Ambulatory Visit: Payer: Self-pay | Admitting: Medical

## 2021-02-08 NOTE — Telephone Encounter (Signed)
Patient states he needs  he need 15 day [facility] [place]  30 day [facility]    Requesting: tramadol Contract:03/2020 UDS:07/2019 Last Visit:12/22/20 Next Visit:n/a Last Refill:07/10/2020  Please Advise

## 2021-02-09 DIAGNOSIS — G2581 Restless legs syndrome: Secondary | ICD-10-CM | POA: Diagnosis not present

## 2021-02-09 MED ORDER — TRAMADOL HCL 50 MG PO TABS: 50.0000 mg | ORAL_TABLET | Freq: Three times a day (TID) | ORAL | 0 refills | Status: DC | PRN

## 2021-02-09 NOTE — Telephone Encounter (Signed)
Called [facility] last script of tramadol was sent in january 2022 and he doesn't have any active tramadol refills

## 2021-02-09 NOTE — Telephone Encounter (Signed)
Refilled tramadol today.

## 2021-02-09 NOTE — Telephone Encounter (Signed)
Opened to review prior to rx refill.

## 2021-02-10 DIAGNOSIS — Z85828 Personal history of other malignant neoplasm of skin: Secondary | ICD-10-CM | POA: Diagnosis not present

## 2021-02-10 DIAGNOSIS — Z9989 Dependence on other enabling machines and devices: Secondary | ICD-10-CM | POA: Diagnosis not present

## 2021-02-10 DIAGNOSIS — G4733 Obstructive sleep apnea (adult) (pediatric): Secondary | ICD-10-CM | POA: Diagnosis not present

## 2021-02-10 DIAGNOSIS — G2581 Restless legs syndrome: Secondary | ICD-10-CM | POA: Diagnosis not present

## 2021-02-10 DIAGNOSIS — I440 Atrioventricular block, first degree: Secondary | ICD-10-CM | POA: Diagnosis not present

## 2021-02-10 DIAGNOSIS — E1122 Type 2 diabetes mellitus with diabetic chronic kidney disease: Secondary | ICD-10-CM | POA: Diagnosis present

## 2021-02-10 DIAGNOSIS — Z8673 Personal history of transient ischemic attack (TIA), and cerebral infarction without residual deficits: Secondary | ICD-10-CM | POA: Diagnosis not present

## 2021-02-10 DIAGNOSIS — I129 Hypertensive chronic kidney disease with stage 1 through stage 4 chronic kidney disease, or unspecified chronic kidney disease: Secondary | ICD-10-CM | POA: Diagnosis not present

## 2021-02-10 DIAGNOSIS — L03116 Cellulitis of left lower limb: Secondary | ICD-10-CM | POA: Diagnosis not present

## 2021-02-10 DIAGNOSIS — N400 Enlarged prostate without lower urinary tract symptoms: Secondary | ICD-10-CM | POA: Diagnosis present

## 2021-02-10 DIAGNOSIS — R42 Dizziness and giddiness: Secondary | ICD-10-CM | POA: Diagnosis not present

## 2021-02-10 DIAGNOSIS — N189 Chronic kidney disease, unspecified: Secondary | ICD-10-CM | POA: Diagnosis not present

## 2021-02-10 DIAGNOSIS — R0602 Shortness of breath: Secondary | ICD-10-CM | POA: Diagnosis not present

## 2021-02-10 DIAGNOSIS — L539 Erythematous condition, unspecified: Secondary | ICD-10-CM | POA: Diagnosis not present

## 2021-02-10 DIAGNOSIS — M7989 Other specified soft tissue disorders: Secondary | ICD-10-CM | POA: Diagnosis not present

## 2021-02-12 NOTE — Discharge Summary (Signed)
 Medicine Discharge Summary  [facility] Hospitalist Discharge Summary   Patient ID: Brandon Mason [id number] 87 y.o. 27-Feb-1926   Admit date: 02/10/2021  Discharge date: 02/12/2021  Admitting Physician: Erica MALVA Lia, MD Discharge Physician: Durwood MARLA Donning, MD  Discharge Diagnoses:  Principal Problem:   Cellulitis Resolved Problems:   * No resolved hospital problems. [facility] Course:  Per Dr Severiano H&P, 87 year old male with a history of obstructive sleep apnea who uses CPAP history of CVA, history of BPH, history of basal cell skin cancer, chronic kidney disease hypertension restless leg syndrome.  Per patient he has had left leg swelling for months.  Yesterday he noticed redness of the same left leg.  Patient has chronic dependent edema and uses compression socks occasionally.  In the emergency room patient was found to have cellulitis of the left lower extremity.  Patient complains of shortness of breath lightheadedness chills nausea. in the emergency room patient was found to have elevated WBC of 15.7,  Course  Patient admitted for possible cellulitis of LLE. Patient has vascular insufficiency and not very clear there is active cellulitis however patient reported improvement after ABX. patient was started on clindamycin but later switched to Ancef . Prior to this patient was not on ABX.  Due to reported improvement in erythema patient will be continued on Keflex  for 10 days.  Advised to elevated LLE and use compression stockings if needed to help with swelling.   Patient will be discharged home with follow up with PCP   Physical Exam Constitutional:      General: He is not in acute distress.    Appearance: He is not ill-appearing.  HENT:     Head: Normocephalic and atraumatic.     Nose: Nose normal. No congestion.     Mouth/Throat:     Mouth: Mucous membranes are moist.     Pharynx: No oropharyngeal exudate.  Cardiovascular:     Rate and Rhythm: Normal rate and  regular rhythm.     Pulses: Normal pulses.     Heart sounds: Normal heart sounds. No murmur heard.   No friction rub.  Pulmonary:     Effort: Pulmonary effort is normal. No respiratory distress.     Breath sounds: Normal breath sounds. No stridor. No wheezing or rhonchi.  Abdominal:     General: Bowel sounds are normal. There is no distension.     Palpations: Abdomen is soft. There is no mass.     Tenderness: There is no abdominal tenderness.     Hernia: No hernia is present.  Musculoskeletal:        General: Swelling present. No deformity.     Comments: LE Swelling. Left > Right.   Skin:    Coloration: Skin is not jaundiced.     Findings: No bruising.     Comments: Erythema to to LLE. Mild warmth no tenderness.   Neurological:     General: No focal deficit present.     Mental Status: He is alert and oriented to person, place, and time. Mental status is at baseline.  Psychiatric:        Mood and Affect: Mood normal.        Behavior: Behavior normal.      Discharged Condition: good Patient's Ordered Code Status: Full Code  Recommendations for Follow Up:  Monitor for improvement in LLE swelling, redness    Consults:  Orders Placed This Encounter  Procedures  . Consult to Hospitalist (General)  Procedures:  No admission procedures for hospital encounter.  Recent Labs:     Lab Results  Component Value Date   WBC 8.8 02/11/2021   HGB 10.5 (L) 02/11/2021   HCT 31.7 (L) 02/11/2021   PLT 170 02/11/2021    Lab Results  Component Value Date   CO2 28 02/11/2021   BUN 24 02/11/2021   CREATININE 0.88 02/11/2021   CALCIUM  7.9 (L) 02/11/2021   ALBUMIN 4.2 09/15/2019   AST 17 09/15/2019   ALT 10 09/15/2019    Lab Results  Component Value Date   NA 138 02/11/2021   K 3.4 (L) 02/11/2021   CL 108 02/11/2021   CO2 28 02/11/2021   BUN 24 02/11/2021   CREATININE 0.88 02/11/2021   CALCIUM  7.9 (L) 02/11/2021    Lab Results  Component Value Date   ALKPHOS 60  09/15/2019   BILITOT 0.4 09/15/2019   PROT 7.6 09/15/2019   ALBUMIN 4.2 09/15/2019   ALT 10 09/15/2019   AST 17 09/15/2019    No results found for: INR, AP[facility] Hospital Radiology XR CHEST AP  PORTABLE  Result Date: 02/10/2021 CLINICAL DATA:  Shortness of breath, lower leg cellulitis EXAM: PORTABLE CHEST 1 VIEW COMPARISON:  11/15/2019 FINDINGS: Mild linear scarring in the right mid lung. Increased interstitial markings without focal consolidation. No pleural effusion or pneumothorax. The heart is top-normal in size. IMPRESSION: No evidence of acute cardiopulmonary disease. Electronically Signed   By: Pinkie Pebbles M.D.   On: 02/10/2021 22:23   US  LOWER EXTREMITY LEFT VENOUS DUPLEX  Result Date: 02/10/2021 CLINICAL DATA:  Left leg swelling and cellulitis. EXAM: LEFT LOWER EXTREMITY VENOUS DOPPLER ULTRASOUND TECHNIQUE: Gray-scale sonography with compression, as well as color and duplex ultrasound, were performed to evaluate the deep venous system(s) from the level of the common femoral vein through the popliteal and proximal calf veins. COMPARISON:  September 15, 2019 FINDINGS: VENOUS Normal compressibility of the LEFT common femoral, superficial femoral, and popliteal veins, as well as the visualized portion of the LEFT posterior tibial vein. The LEFT peroneal vein is not visualized. Visualized portions of the LEFT profunda femoral vein and LEFT great saphenous vein unremarkable. No filling defects to suggest DVT on grayscale or color Doppler imaging. Doppler waveforms show normal direction of venous flow, normal respiratory plasticity and response to augmentation. Limited views of the contralateral common femoral vein are unremarkable. OTHER None. Limitations: none IMPRESSION: No evidence of LEFT lower extremity DVT. Electronically Signed   By: Suzen Dials M.D.   On: 02/10/2021 23:26     Disposition: Home   Discharge Medications:   .    START taking these medications   cephalexin  500  MG capsule Commonly known as: KEFLEX  Dose: 500 mg Instructions: Take 1 capsule (500 mg total) by mouth 4 times daily for 10 days.     CONTINUE taking these medications   amLODIPine  10 MG tablet Commonly known as: NORVASC  Instructions: TAKE 1 TABLET BY MOUTH ONCE DAILY   aspirin  325 MG tablet  *ANTIPLATELET* Dose: 325 mg Instructions: Take 325 mg by mouth daily.   famotidine  20 MG tablet Commonly known as: PEPCID  Dose: 20 mg Instructions: Take 20 mg by mouth daily.   finasteride  5 mg tablet Commonly known as: PROSCAR  Dose: 5 mg Instructions: Take 1 tablet (5 mg total) by mouth daily. Indication: enlarged prostate with urination problem   lisinopriL  20 MG tablet Commonly known as: PRINIVIL ,ZESTRIL  Instructions: TAKE ONE TABLET BY MOUTH ONCE DAILY Comment: Patient needs appt for  further refills   naproxen sodium 220 MG tablet Commonly known as: ALEVE Dose: 220 mg Instructions: Take 220 mg by mouth 2 (two)  times daily as needed.   OXcarbazepine 150 MG tablet Commonly known as: TRILEPTAL Dose: 150 mg Instructions: Take 1 tablet (150 mg total) by mouth 2 times daily.   pregabalin  150 MG capsule Commonly known as: LYRICA  Dose: 150 mg Instructions: Take 1 capsule (150 mg total) by mouth 2 times daily.   pregabalin  150 MG capsule Commonly known as: LYRICA  Dose: 150 mg Instructions: Take 1 capsule (150 mg total) by mouth 2 times daily.   rOPINIRole  4 MG tablet Commonly known as: REQUIP  Dose: 4 mg Instructions: Take 1 tablet (4 mg total) by mouth nightly. Comment: Dosage change via phone call with pt 11/01/19.   traMADoL  50 mg tablet Commonly known as: ULTRAM  Dose: 50 mg Instructions: Take 1 tablet (50 mg total) by mouth 3 times daily. Comment: Dosage change via phone call with pt 11/01/19.     STOP taking these medications   nystatin  100,000 unit/gram cream Commonly known as: MYCOSTATIN            Follow-up Appointments    Electronically signed by:  Durwood MARLA Donning, MD 02/12/2021 9:18 AM   Time spent on discharge: 35 min     Electronically signed by: Durwood MARLA Donning, MD 02/12/21 1535

## 2021-02-14 DIAGNOSIS — E1122 Type 2 diabetes mellitus with diabetic chronic kidney disease: Secondary | ICD-10-CM | POA: Diagnosis not present

## 2021-02-14 DIAGNOSIS — Z79891 Long term (current) use of opiate analgesic: Secondary | ICD-10-CM | POA: Diagnosis not present

## 2021-02-14 DIAGNOSIS — I129 Hypertensive chronic kidney disease with stage 1 through stage 4 chronic kidney disease, or unspecified chronic kidney disease: Secondary | ICD-10-CM | POA: Diagnosis not present

## 2021-02-14 DIAGNOSIS — M5126 Other intervertebral disc displacement, lumbar region: Secondary | ICD-10-CM | POA: Diagnosis not present

## 2021-02-14 DIAGNOSIS — N189 Chronic kidney disease, unspecified: Secondary | ICD-10-CM | POA: Diagnosis not present

## 2021-02-14 DIAGNOSIS — Z9181 History of falling: Secondary | ICD-10-CM | POA: Diagnosis not present

## 2021-02-14 DIAGNOSIS — I998 Other disorder of circulatory system: Secondary | ICD-10-CM | POA: Diagnosis not present

## 2021-02-14 DIAGNOSIS — Z7982 Long term (current) use of aspirin: Secondary | ICD-10-CM | POA: Diagnosis not present

## 2021-02-14 DIAGNOSIS — G2581 Restless legs syndrome: Secondary | ICD-10-CM | POA: Diagnosis not present

## 2021-02-14 DIAGNOSIS — Z8673 Personal history of transient ischemic attack (TIA), and cerebral infarction without residual deficits: Secondary | ICD-10-CM | POA: Diagnosis not present

## 2021-02-14 DIAGNOSIS — Z85828 Personal history of other malignant neoplasm of skin: Secondary | ICD-10-CM | POA: Diagnosis not present

## 2021-02-14 DIAGNOSIS — L03116 Cellulitis of left lower limb: Secondary | ICD-10-CM | POA: Diagnosis not present

## 2021-02-14 DIAGNOSIS — H9190 Unspecified hearing loss, unspecified ear: Secondary | ICD-10-CM | POA: Diagnosis not present

## 2021-02-14 DIAGNOSIS — N400 Enlarged prostate without lower urinary tract symptoms: Secondary | ICD-10-CM | POA: Diagnosis not present

## 2021-02-14 DIAGNOSIS — G4733 Obstructive sleep apnea (adult) (pediatric): Secondary | ICD-10-CM | POA: Diagnosis not present

## 2021-02-16 DIAGNOSIS — L03116 Cellulitis of left lower limb: Secondary | ICD-10-CM | POA: Diagnosis not present

## 2021-02-16 DIAGNOSIS — I129 Hypertensive chronic kidney disease with stage 1 through stage 4 chronic kidney disease, or unspecified chronic kidney disease: Secondary | ICD-10-CM | POA: Diagnosis not present

## 2021-02-16 DIAGNOSIS — N189 Chronic kidney disease, unspecified: Secondary | ICD-10-CM | POA: Diagnosis not present

## 2021-02-16 DIAGNOSIS — I998 Other disorder of circulatory system: Secondary | ICD-10-CM | POA: Diagnosis not present

## 2021-02-16 DIAGNOSIS — E1122 Type 2 diabetes mellitus with diabetic chronic kidney disease: Secondary | ICD-10-CM | POA: Diagnosis not present

## 2021-02-16 DIAGNOSIS — G2581 Restless legs syndrome: Secondary | ICD-10-CM | POA: Diagnosis not present

## 2021-02-19 DIAGNOSIS — I129 Hypertensive chronic kidney disease with stage 1 through stage 4 chronic kidney disease, or unspecified chronic kidney disease: Secondary | ICD-10-CM | POA: Diagnosis not present

## 2021-02-19 DIAGNOSIS — I998 Other disorder of circulatory system: Secondary | ICD-10-CM | POA: Diagnosis not present

## 2021-02-19 DIAGNOSIS — E1122 Type 2 diabetes mellitus with diabetic chronic kidney disease: Secondary | ICD-10-CM | POA: Diagnosis not present

## 2021-02-19 DIAGNOSIS — N189 Chronic kidney disease, unspecified: Secondary | ICD-10-CM | POA: Diagnosis not present

## 2021-02-19 DIAGNOSIS — L03116 Cellulitis of left lower limb: Secondary | ICD-10-CM | POA: Diagnosis not present

## 2021-02-19 DIAGNOSIS — G2581 Restless legs syndrome: Secondary | ICD-10-CM | POA: Diagnosis not present

## 2021-02-26 ENCOUNTER — Telehealth: Payer: Self-pay | Admitting: Medical

## 2021-02-26 NOTE — Telephone Encounter (Signed)
Belenda Cruise from [facility] is calling in regards of the patient, She states that the patient's restless leg is bad and she doesn't think the medication is helping. She wants to know if either the Requip or Lyrica can be increased. She can be called back to [phone]

## 2021-02-26 NOTE — Telephone Encounter (Signed)
Please advise 

## 2021-02-27 ENCOUNTER — Encounter: Payer: Self-pay | Admitting: Medical

## 2021-02-27 ENCOUNTER — Other Ambulatory Visit: Payer: Self-pay

## 2021-02-27 ENCOUNTER — Ambulatory Visit (INDEPENDENT_AMBULATORY_CARE_PROVIDER_SITE_OTHER): Payer: Medicare Other | Admitting: Medical

## 2021-02-27 VITALS — BP 132/64 | HR 68 | Temp 98.3°F | Resp 18 | Ht 68.0 in | Wt 176.0 lb

## 2021-02-27 DIAGNOSIS — I10 Essential (primary) hypertension: Secondary | ICD-10-CM | POA: Diagnosis not present

## 2021-02-27 DIAGNOSIS — L03116 Cellulitis of left lower limb: Secondary | ICD-10-CM | POA: Diagnosis not present

## 2021-02-27 DIAGNOSIS — I129 Hypertensive chronic kidney disease with stage 1 through stage 4 chronic kidney disease, or unspecified chronic kidney disease: Secondary | ICD-10-CM | POA: Diagnosis not present

## 2021-02-27 DIAGNOSIS — E1122 Type 2 diabetes mellitus with diabetic chronic kidney disease: Secondary | ICD-10-CM | POA: Diagnosis not present

## 2021-02-27 DIAGNOSIS — N189 Chronic kidney disease, unspecified: Secondary | ICD-10-CM | POA: Diagnosis not present

## 2021-02-27 DIAGNOSIS — G2581 Restless legs syndrome: Secondary | ICD-10-CM | POA: Diagnosis not present

## 2021-02-27 DIAGNOSIS — I998 Other disorder of circulatory system: Secondary | ICD-10-CM | POA: Diagnosis not present

## 2021-02-27 MED ORDER — DOXYCYCLINE HYCLATE 100 MG PO TABS: 100.0000 mg | ORAL_TABLET | Freq: Two times a day (BID) | ORAL | 0 refills | Status: DC

## 2021-02-27 MED ORDER — HYDROCHLOROTHIAZIDE 12.5 MG PO CAPS: 12.5000 mg | ORAL_CAPSULE | Freq: Every day | ORAL | 0 refills | Status: DC

## 2021-02-27 MED ORDER — HYDROCHLOROTHIAZIDE 12.5 MG PO CAPS: ORAL_CAPSULE | ORAL | 0 refills | Status: DC

## 2021-02-27 NOTE — Telephone Encounter (Signed)
Pt being seen today at 4pm.

## 2021-02-27 NOTE — Progress Notes (Signed)
Subjective:    Patient ID: Brandon Mason, male    DOB: 10-12-1925, 87 y.o.   MRN: [id number]  HPI  Pt in for follow up.  Pt has some moderate swelling to left lower extremity. Home health sent pt in for evaluation.  Pt was [facility] at [facility] the month.  Admit date: 02/10/2021  Discharge date: 02/12/2021  Admitting Physician: Adan Sis, MD Discharge Physician: Maryan Rued, MD   Hospital Course:  Per Dr Warren Danes H&P, 87 year old male with a history of obstructive sleep apnea who uses CPAP history of CVA, history of BPH, history of basal cell skin cancer, chronic kidney disease hypertension restless leg syndrome. Per patient he has had left leg swelling for months. Yesterday he noticed redness of the same left leg. Patient has chronic dependent edema and uses compression socks occasionally. In the emergency room patient was found to have cellulitis of the left lower extremity. Patient complains of shortness of breath lightheadedness chills nausea. in the emergency room patient was found to have elevated WBC of 15.7,   Course  Patient admitted for possible cellulitis of LLE. Patient has vascular insufficiency and not very clear there is active cellulitis however patient reported improvement after ABX. patient was started on clindamycin but later switched to Ancef. Prior to this patient was not on ABX.  Due to reported improvement in erythema patient will be continued on Keflex for 10 days.  Advised to elevated LLE and use compression stockings if needed to help with swelling.   Patient will be discharged home with follow up with PCP  On discharge labs below  Lab Results  Component Value Date  WBC 8.8 02/11/2021  HGB 10.5 (L) 02/11/2021  HCT 31.7 (L) 02/11/2021  PLT 170 02/11/2021   [place] LOWER EXTREMITY LEFT VENOUS DUPLEX  Result Date: 02/10/2021 CLINICAL DATA: Left leg swelling and cellulitis. EXAM: LEFT LOWER EXTREMITY VENOUS DOPPLER ULTRASOUND TECHNIQUE:  Gray-scale sonography with compression, as well as color and duplex ultrasound, were performed to evaluate the deep venous system(s) from the level of the common femoral vein through the popliteal and proximal calf veins. COMPARISON: September 15, 2019 FINDINGS: VENOUS Normal compressibility of the LEFT common femoral, superficial femoral, and popliteal veins, as well as the visualized portion of the LEFT posterior tibial vein. The LEFT peroneal vein is not visualized. Visualized portions of the LEFT profunda femoral vein and LEFT great saphenous vein unremarkable. No filling defects to suggest DVT on grayscale or color Doppler imaging. Doppler waveforms show normal direction of venous flow, normal respiratory plasticity and response to augmentation. Limited views of the contralateral common femoral vein are unremarkable. OTHER None. Limitations: none IMPRESSION: No evidence of LEFT lower extremity DVT   Pt is on lyrica 150 mg bid, tramadol 50 mg tid and requipa. Hx of restless leg syndrome and neuropathy.     Review of Systems  Constitutional:  Negative for chills, fatigue and fever.  Respiratory:  Negative for cough, chest tightness, shortness of breath and wheezing.   Cardiovascular:  Negative for chest pain and palpitations.  Gastrointestinal:  Negative for abdominal pain and blood in stool.  Musculoskeletal:  Negative for back pain and gait problem.  Hematological:  Negative for adenopathy. Does not bruise/bleed easily.  Psychiatric/Behavioral:  Negative for behavioral problems and confusion.     Past Medical History:  Diagnosis Date   Abnormality of gait 02/09/2013   Arthritis    Cancer ([place])    basal cell skin cancer  Cardiomegaly    Chronic kidney disease    BPH   Chronic leg pain    Hypersomnia    Hypertension    Hypoxia    pulmonary   Neuromuscular disorder (HCC)    restless leg syndrome    Nocturia    Prostate disorder    Restless legs syndrome (RLS) 02/09/2013   Restless  legs syndrome with nocturnal myoclonus    RLS (restless legs syndrome)    x 40 years   Shortness of breath    with exertion    Sleep apnea    uses cpap   Stroke [facility])    minor stroke in 2003 - no lasting side effects   UARS (upper airway resistance syndrome) 06/28/2014     Social History   Socioeconomic History   Marital status: Married    Spouse name: Ann   Number of children: 5   Years of education: 12   Highest education level: Not on file  Occupational History   Occupation: retired     Comment: [name] for a [employer]  Tobacco Use   Smoking status: Never   Smokeless tobacco: Never  Substance and Sexual Activity   Alcohol use: Yes    Alcohol/week: 2.0 standard drinks    Types: 2 Cans of beer per week    Comment: weekly   Drug use: No   Sexual activity: Not on file  Other Topics Concern   Not on file  Social History Narrative   Patient lives at home with his wife Brandon Mason.   Patient is retired.    Patient has 5 children.   Patient has a high school education.   Patient is right-handed.   Patient drinks very little caffeine.   Social Determinants of Health   Financial Resource Strain: Not on file  Food Insecurity: Not on file  Transportation Needs: Not on file  Physical Activity: Not on file  Stress: Not on file  Social Connections: Not on file  Intimate Partner Violence: Not on file    Past Surgical History:  Procedure Laterality Date   ANTERIOR CERVICAL DECOMP/DISCECTOMY FUSION N/A 03/29/2016   Procedure: ANTERIOR CERVICAL DECOMPRESSION/DISCECTOMY FUSION CERVICAL THREE- CERVICAL FOUR;  Surgeon: Ashok Pall, MD;  Location: [facility];  Service: Neurosurgery;  Laterality: N/A;   BACK SURGERY  2003   L4-5   CATARACT EXTRACTION Bilateral    HERNIA REPAIR     inguinal hernia   TRANSURETHRAL PROSTATECTOMY WITH GYRUS INSTRUMENTS  04/28/2012   Procedure: TRANSURETHRAL PROSTATECTOMY WITH GYRUS INSTRUMENTS;  Surgeon: Fredricka Bonine, MD;   Location: [facility];  Service: Urology;  Laterality: N/A;   TRANSURETHRAL RESECTION OF PROSTATE  03/24/2012   Procedure: TRANSURETHRAL RESECTION OF THE PROSTATE (TURP);  Surgeon: Fredricka Bonine, MD;  Location: [facility] OR[facility];  Service: Urology;  Laterality: N/A;  with gyrus ,Greenlight PVP laser of Prostate    Family History  Problem Relation Age of Onset   Restless legs syndrome Mother    Restless legs syndrome Daughter    Esophageal cancer Neg Hx    Colon cancer Neg Hx    Pancreatic cancer Neg Hx    Stomach cancer Neg Hx     No Known Allergies  Current Outpatient Medications on File Prior to Visit  Medication Sig Dispense Refill   amLODipine (NORVASC) 10 MG tablet TAKE 1 TABLET BY MOUTH  DAILY 90 tablet 3   famotidine (PEPCID) 20 MG tablet Take 1 tablet by mouth once daily 30 tablet 0  finasteride (PROSCAR) 5 MG tablet TAKE 1 TABLET BY MOUTH  DAILY 90 tablet 3   lisinopril (ZESTRIL) 20 MG tablet TAKE 1 TABLET BY MOUTH  DAILY 90 tablet 3   magic mouthwash SOLN Take 5 mLs by mouth 4 (four) times daily as needed for mouth pain. 200 mL 0   mupirocin ointment (BACTROBAN) 2 % APPLY  OINTMENT TOPICALLY TO AFFECTED AREA TWICE DAILY 22 g 0   nystatin cream (MYCOSTATIN) APPLY  CREAM TOPICALLY TO AFFECTED AREA TWICE DAILY 30 g 0   OXcarbazepine (TRILEPTAL) 150 MG tablet Take by mouth.     pregabalin (LYRICA) 150 MG capsule Take 150 mg by mouth 2 (two) times daily.     rOPINIRole (REQUIP) 4 MG tablet TAKE 1 TABLET BY MOUTH AT  BEDTIME 90 tablet 3   traMADol (ULTRAM) 50 MG tablet Take 1 tablet (50 mg total) by mouth every 8 (eight) hours as needed. for pain 90 tablet 0   Current Facility-Administered Medications on File Prior to Visit  Medication Dose Route Frequency Provider Last Rate Last Admin   cyanocobalamin ((VITAMIN B-12)) injection 1,000 mcg  1,000 mcg Intramuscular Once Ramy Greth, [name]        BP 132/64 (BP Location: Left Arm, Patient Position: Sitting, Cuff Size: Large)   Pulse  68   Temp 98.3 F (36.8 C) (Oral)   Resp 18   Ht '5\' 8"'$  (1.727 m)   Wt 176 lb (79.8 kg)   SpO2 95%   BMI 26.76 kg/m       Objective:   Physical Exam  General- No acute distress. Pleasant patient. Neck- Full range of motion, no jvd Lungs- Clear, even and unlabored. Heart- regular rate and rhythm. Neurologic- CNII- XII grossly intact.  Left lower ext- lateral aspect of calf is moderate swollen, red and tender. Slight skin break down with weeping of broken down area.       Assessment & Plan:   You do have recent left lower extremity swelling consistent with probable cellulitis that was diagnosed and treated when you are hospitalized.  The leg appears warm with some slight breakdown to the lateral aspect and weeping area.  We will get wound culture and place you on doxycycline antibiotic.  Rx advisement given regarding antibiotic use.  Recommend that you increase probiotics in your diet or get probiotics over-the-counter.   Some swelling in the left lower extremity and nursing staff recommended diuretics.  I do not think daily diuretics would be recommended.  However did give 2 tablet prescription so  can use HCTZ 12.5 mg today and then repeat 1 dose on Friday.  Will try to arrange for wound care to come out and apply Unna boot to area.  If unable to get wound care by Thursday or Friday then want you to follow-up with me on Friday afternoon so I can reassess the area.  If you do get the Unna boot placed this week then I want you to follow-up with me 1 week from date of boot placement. Sooner if needed such as if you have increased left lower extremity swelling or pain despite treatment.   I did call [facility] and left message asking for pt to have Unaboot placed to left lower extremity and to have cbc & cmp placed by Thursday.  Mackie Pai, [name]   Time spent with patient today was 41  minutes which consisted of chart revdiew, discussing diagnosis, work up treatment and  documentation.

## 2021-02-27 NOTE — Telephone Encounter (Signed)
Belenda Cruise, RN from [facility] called back again in regards of the patients leg looking more swollen than yesterday. We set up an appointment for the patient to see [place] today and the nurse states that she suggest orders for a diuretic and compression therapy. She can be reached at [phone] for verbal orders or the orders can be faxed to [phone].

## 2021-02-27 NOTE — Patient Instructions (Addendum)
You do have recent left lower extremity swelling consistent with probable cellulitis that was diagnosed and treated when you are hospitalized.  The leg appears warm with some slight breakdown to the lateral aspect and weeping area.  We will get wound culture and place you on doxycycline antibiotic.  Rx advisement given regarding antibiotic use.  Recommend that you increase probiotics in your diet or get probiotics over-the-counter.   Some swelling in the left lower extremity and nursing staff recommended diuretics.  I do not think daily diuretics would be recommended.  However did give 2 tablet prescription so  can use HCTZ 12.5 mg today and then repeat 1 dose on Friday.  Will try to arrange for wound care to come out and apply Unna boot to area.  If unable to get wound care by Thursday or Friday then want you to follow-up with me on Friday afternoon so I can reassess the area.  If you do get the Unna boot placed this week then I want you to follow-up with me 1 week from date of boot placement. Sooner if needed such as if you have increased left lower extremity swelling or pain despite treatment.   I did call [facility] and left message  for Belenda Cruise RN asking for pt to have Unaboot placed to left lower extremity and to have cbc & cmp placed by Thursday.

## 2021-02-28 ENCOUNTER — Telehealth: Payer: Self-pay | Admitting: Medical

## 2021-03-01 ENCOUNTER — Telehealth: Payer: Self-pay | Admitting: Medical

## 2021-03-01 ENCOUNTER — Encounter: Payer: Self-pay | Admitting: Medical

## 2021-03-01 DIAGNOSIS — N189 Chronic kidney disease, unspecified: Secondary | ICD-10-CM | POA: Diagnosis not present

## 2021-03-01 DIAGNOSIS — E1122 Type 2 diabetes mellitus with diabetic chronic kidney disease: Secondary | ICD-10-CM | POA: Diagnosis not present

## 2021-03-01 DIAGNOSIS — I129 Hypertensive chronic kidney disease with stage 1 through stage 4 chronic kidney disease, or unspecified chronic kidney disease: Secondary | ICD-10-CM | POA: Diagnosis not present

## 2021-03-01 DIAGNOSIS — G2581 Restless legs syndrome: Secondary | ICD-10-CM | POA: Diagnosis not present

## 2021-03-01 DIAGNOSIS — I998 Other disorder of circulatory system: Secondary | ICD-10-CM | POA: Diagnosis not present

## 2021-03-01 DIAGNOSIS — L03116 Cellulitis of left lower limb: Secondary | ICD-10-CM | POA: Diagnosis not present

## 2021-03-01 NOTE — Telephone Encounter (Signed)
Pt. Daughter called in and wanted to see if there is a way to go over [place] regarding his restless leg. The home health nurse suggested he stay on medication for it and they want to come to an agreement on how to treat the restless leg so the Pt.can have some sort of comfort.She can be reached during his 9/6 apt to discuss this further.  Laren BoomMI:[phone]

## 2021-03-02 ENCOUNTER — Telehealth: Payer: Self-pay | Admitting: Medical

## 2021-03-02 LAB — WOUND CULTURE
MICRO NUMBER:: 12309857
SPECIMEN QUALITY:: ADEQUATE

## 2021-03-02 NOTE — Telephone Encounter (Signed)
See below

## 2021-03-02 NOTE — Telephone Encounter (Signed)
Medication: traMADol (ULTRAM) 50 MG tablet   Has the patient contacted their pharmacy? No. (If no, request that the patient contact the pharmacy for the refill.) (If yes, when and what did the pharmacy advise?)  Preferred Pharmacy (with phone number or street name):  [facility]  ([place]) [place], [place] [facility]  [address] [place] [address], [place] [address]  Phone:  [phone]  Fax:  [phone]  Agent: Please be advised that RX refills may take up to 3 business days. We ask that you follow-up with your pharmacy.

## 2021-03-06 ENCOUNTER — Other Ambulatory Visit: Payer: Self-pay

## 2021-03-06 ENCOUNTER — Telehealth: Payer: Self-pay | Admitting: Medical

## 2021-03-06 ENCOUNTER — Ambulatory Visit (HOSPITAL_BASED_OUTPATIENT_CLINIC_OR_DEPARTMENT_OTHER)
Admission: RE | Admit: 2021-03-06 | Discharge: 2021-03-06 | Disposition: A | Discharge status: Home / Self Care | Payer: Medicare Other | Source: Ambulatory Visit | Attending: Medical | Admitting: Medical

## 2021-03-06 ENCOUNTER — Ambulatory Visit (INDEPENDENT_AMBULATORY_CARE_PROVIDER_SITE_OTHER): Payer: Medicare Other | Admitting: Medical

## 2021-03-06 VITALS — BP 120/62 | HR 74 | Temp 98.0°F | Resp 18 | Wt 173.8 lb

## 2021-03-06 DIAGNOSIS — G629 Polyneuropathy, unspecified: Secondary | ICD-10-CM | POA: Diagnosis not present

## 2021-03-06 DIAGNOSIS — G2581 Restless legs syndrome: Secondary | ICD-10-CM

## 2021-03-06 DIAGNOSIS — M542 Cervicalgia: Secondary | ICD-10-CM | POA: Insufficient documentation

## 2021-03-06 DIAGNOSIS — L03116 Cellulitis of left lower limb: Secondary | ICD-10-CM | POA: Diagnosis not present

## 2021-03-06 MED ORDER — DOXYCYCLINE HYCLATE 100 MG PO TABS: 100.0000 mg | ORAL_TABLET | Freq: Two times a day (BID) | ORAL | 0 refills | Status: DC

## 2021-03-06 NOTE — Telephone Encounter (Signed)
Will you cal lover [facility]. See if can get pt scheduled for evaluateion on sept 19 for circaid wraps lower ext. Severe pedal edema history.

## 2021-03-06 NOTE — Telephone Encounter (Signed)
Had to leave a VM on the clinic number- called [facility] since he lives In [place].   [phone]

## 2021-03-06 NOTE — Progress Notes (Signed)
Subjective:    Patient ID: Brandon Mason, male    DOB: 1926-05-17, 87 y.o.   MRN: [id number]  HPI  Pt is in for follow up appointment. Pt has had unaboot on his left lower extremity since last Thursday.   Pt has hx of restless legs and neuropathy.  Pt is on tramadol 50 mg every 8 hours. Lyrica 150 mg twice daily. Also on requip.  Pt states he is able to sleep some recently.   Pt states home health did draw blood work late last week.  Pt wound culture showed   "A mix of organisms of questionable significance was recovered on culture and not further identified. (Note: Growth did not detect the presence of S.aureus, beta-hemolytic Streptococci or P.aeruginosa)."   Unaboot has been on since Thursday.   No diarrhea with use of antibiotic.  States tends to be more constipated.      Review of Systems  Constitutional:  Negative for chills, fatigue and fever.  Respiratory:  Negative for cough, chest tightness, shortness of breath and wheezing.   Cardiovascular:  Negative for chest pain and palpitations.  Gastrointestinal:  Negative for abdominal pain.  Genitourinary:  Negative for dysuria, flank pain and hematuria.  Musculoskeletal:  Negative for back pain, joint swelling and neck pain.    Past Medical History:  Diagnosis Date   Abnormality of gait 02/09/2013   Arthritis    Cancer (HCC)    basal cell skin cancer   Cardiomegaly    Chronic kidney disease    BPH   Chronic leg pain    Hypersomnia    Hypertension    Hypoxia    pulmonary   Neuromuscular disorder (HCC)    restless leg syndrome    Nocturia    Prostate disorder    Restless legs syndrome (RLS) 02/09/2013   Restless legs syndrome with nocturnal myoclonus    RLS (restless legs syndrome)    x 40 years   Shortness of breath    with exertion    Sleep apnea    uses cpap   Stroke ([place])    minor stroke in 2003 - no lasting side effects   UARS (upper airway resistance syndrome) 06/28/2014     Social  History   Socioeconomic History   Marital status: Married    Spouse name: Ann   Number of children: 5   Years of education: 12   Highest education level: Not on file  Occupational History   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   Patient is retired.    Patient has 5 children.   Patient has a high school education.   Patient is right-handed.   Patient drinks very little caffeine.   Social Determinants of Health   Financial Resource Strain: Not on file  Food Insecurity: Not on file  Transportation Needs: Not on file  Physical Activity: Not on file  Stress: Not on file  Social Connections: Not on file  Intimate Partner Violence: Not on file  Past Surgical History:  Procedure Laterality Date   ANTERIOR CERVICAL DECOMP/DISCECTOMY FUSION N/A 03/29/2016   Procedure: ANTERIOR CERVICAL DECOMPRESSION/DISCECTOMY FUSION CERVICAL THREE- CERVICAL FOUR;  Surgeon: Ashok Pall, MD;  Location: [facility];  Service: Neurosurgery;  Laterality: N/A;   BACK SURGERY  2003   L4-5   CATARACT EXTRACTION Bilateral    HERNIA REPAIR     inguinal hernia   TRANSURETHRAL PROSTATECTOMY WITH GYRUS INSTRUMENTS  04/28/2012   Procedure: TRANSURETHRAL PROSTATECTOMY WITH GYRUS INSTRUMENTS;  Surgeon: Fredricka Bonine, MD;  Location: [facility];  Service: Urology;  Laterality: N/A;   TRANSURETHRAL RESECTION OF PROSTATE  03/24/2012   Procedure: TRANSURETHRAL RESECTION OF THE PROSTATE (TURP);  Surgeon: Fredricka Bonine, MD;  Location: [facility];  Service: Urology;  Laterality: N/A;  with gyrus ,Greenlight PVP laser of  Prostate    Family History  Problem Relation Age of Onset   Restless legs syndrome Mother    Restless legs syndrome Daughter    Esophageal cancer Neg Hx    Colon cancer Neg Hx    Pancreatic cancer Neg Hx    Stomach cancer Neg Hx     No Known Allergies  Current Outpatient Medications on File Prior to Visit  Medication Sig Dispense Refill   amLODipine (NORVASC) 10 MG tablet TAKE 1 TABLET BY MOUTH  DAILY 90 tablet 3   finasteride (PROSCAR) 5 MG tablet TAKE 1 TABLET BY MOUTH  DAILY 90 tablet 3   hydrochlorothiazide (MICROZIDE) 12.5 MG capsule 1 tab po today and repeat on Friday. 2 capsule 0   lisinopril (ZESTRIL) 20 MG tablet TAKE 1 TABLET BY MOUTH  DAILY 90 tablet 3   mupirocin ointment (BACTROBAN) 2 % APPLY  OINTMENT TOPICALLY TO AFFECTED AREA TWICE DAILY 22 g 0   nystatin cream (MYCOSTATIN) APPLY  CREAM TOPICALLY TO AFFECTED AREA TWICE DAILY 30 g 0   pregabalin (LYRICA) 150 MG capsule Take 150 mg by mouth 2 (two) times daily.     rOPINIRole (REQUIP) 4 MG tablet TAKE 1 TABLET BY MOUTH AT  BEDTIME 90 tablet 3   traMADol (ULTRAM) 50 MG tablet Take 1 tablet (50 mg total) by mouth every 8 (eight) hours as needed. for pain 90 tablet 0   doxycycline (VIBRA-TABS) 100 MG tablet Take 1 tablet (100 mg total) by mouth 2 (two) times daily. Can give caps or generic 14 tablet 0   OXcarbazepine (TRILEPTAL) 150 MG tablet Take by mouth.     Current Facility-Administered Medications on File Prior to Visit  Medication Dose Route Frequency Provider Last Rate Last Admin   cyanocobalamin ((VITAMIN B-12)) injection 1,000 mcg  1,000 mcg Intramuscular Once Taylr Meuth, [name]        BP 120/62 (BP Location: Left Arm, Patient Position: Sitting, Cuff Size: Normal)   Pulse 74   Temp 98 F (36.7 C) (Oral)   Resp 18   Wt 173 lb 12.8 oz (78.8 kg)   SpO2 96%   BMI 26.43 kg/m       Objective:   Physical Exam   General- No acute distress. Pleasant patient. Neck- Full range of motion, no jvd Lungs-  Clear, even and unlabored. Heart- regular rate and rhythm. Neurologic- CNII- XII grossly intact.   Left lower ext- the unaboot was removed and looks and leg swelling resolved. The weeping wounds on lateral aspect of his left calf are now healed. Broken down area of skin resolved.     Assessment & Plan:   Recent left lower extremity swelling  with breakdown of skin and and warmth.  By exam on last visit he had concern for cellulitis.  Prescribed doxycycline antibiotic and got wound culture.  Nonspecific tried bacteria growth.  Unna boot placed last week and today on removal of wound.  Leg looks much better.  Will continue 4 additional days of doxycycline.  We will try to reach out to home health/wound care and have them put the boot back on this Thursday.  History of restless leg syndrome and neuropathy.  Counseled patient that I am very reluctant to increase dosages of his current medication regimen due to his age.  Benefit versus risk of medications.  Patient is okay with not changing that dosages.  Offered referral back to neurologist at [facility] for second opinion from [facility] neurology and he declined.  About 1 month of some mild neck pain and noticed bilateral hand numbness/tingling.  Will get cervical spine x-ray to see if C-spine neuroforaminal narrowing might be the cause.  On upper extremity exam negative Phalens signs.  Follow-up September 15 or sooner if needed.  Did talk with Belenda Cruise regarding unaboot placement this week Thursday. Also fax cmp and cbc.   [facility] prosthetic [facility]. Asking if can get orthotic wraps.  Mackie Pai, [name]   Time spent with patient today was  41 minutes which consisted of chart review, discussing diagnosis, work up treatment and documentation.

## 2021-03-06 NOTE — Patient Instructions (Signed)
Recent left lower extremity swelling with breakdown of skin and and warmth.  By exam on last visit he had concern for cellulitis.  Prescribed doxycycline antibiotic and got wound culture.  Nonspecific tried bacteria growth.  Unna boot placed last week and today on removal of wound.  Leg looks much better.  Will continue 4 additional days of doxycycline.  We will try to reach out to home health/wound care and have them put the boot back on this Thursday.  History of restless leg syndrome and neuropathy.  Counseled patient that I am very reluctant to increase dosages of his current medication regimen due to his age.  Benefit versus risk of medications.  Patient is okay with not changing that dosages.  Offered referral back to neurologist at [facility] for second opinion from [facility] neurology and he declined.  About 1 month of some mild neck pain and noticed bilateral hand numbness/tingling.  Will get cervical spine x-ray to see if C-spine neuroforaminal narrowing might be the cause.  On upper extremity exam negative Phalens signs.  Follow-up September 15 or sooner if needed.

## 2021-03-07 MED ORDER — TRAMADOL HCL 50 MG PO TABS: 50.0000 mg | ORAL_TABLET | Freq: Three times a day (TID) | ORAL | 0 refills | Status: DC | PRN

## 2021-03-07 NOTE — Telephone Encounter (Signed)
Will get contract and UDS at next visit

## 2021-03-07 NOTE — Telephone Encounter (Addendum)
Requesting: tramadol Contract:03/2020 UDS:07/27/2019 Last Visit:03/06/2021 Next Visit:n/a Last Refill:02/09/2021  Please Advise.  Did refill the medication but only 45 tab rx. Contact 03/2020. Is contract expired. What date did he sign. Needs to get contract updated by end of this month. I think he has follow up already scheduled with me?

## 2021-03-08 DIAGNOSIS — N189 Chronic kidney disease, unspecified: Secondary | ICD-10-CM | POA: Diagnosis not present

## 2021-03-08 DIAGNOSIS — L03116 Cellulitis of left lower limb: Secondary | ICD-10-CM | POA: Diagnosis not present

## 2021-03-08 DIAGNOSIS — I998 Other disorder of circulatory system: Secondary | ICD-10-CM | POA: Diagnosis not present

## 2021-03-08 DIAGNOSIS — I129 Hypertensive chronic kidney disease with stage 1 through stage 4 chronic kidney disease, or unspecified chronic kidney disease: Secondary | ICD-10-CM | POA: Diagnosis not present

## 2021-03-08 DIAGNOSIS — G2581 Restless legs syndrome: Secondary | ICD-10-CM | POA: Diagnosis not present

## 2021-03-08 DIAGNOSIS — E1122 Type 2 diabetes mellitus with diabetic chronic kidney disease: Secondary | ICD-10-CM | POA: Diagnosis not present

## 2021-03-08 NOTE — Telephone Encounter (Signed)
See below

## 2021-03-09 ENCOUNTER — Telehealth: Payer: Self-pay | Admitting: Medical

## 2021-03-09 NOTE — Telephone Encounter (Signed)
Pt called back regarding tramadol refill, he was told he needed USD and contract before he could get refill. He has an appointment on 09/15 to remove wraps off his legs, so he asked if he could get some tramadol to hold him off until then. Please advice.

## 2021-03-09 NOTE — Telephone Encounter (Signed)
error 

## 2021-03-09 NOTE — Telephone Encounter (Signed)
Called pt and notified him of script at pharmacy and he stated he would call pharmacy and if they are any issues he would call back

## 2021-03-14 DIAGNOSIS — L03116 Cellulitis of left lower limb: Secondary | ICD-10-CM | POA: Diagnosis not present

## 2021-03-14 DIAGNOSIS — I998 Other disorder of circulatory system: Secondary | ICD-10-CM | POA: Diagnosis not present

## 2021-03-14 DIAGNOSIS — E1122 Type 2 diabetes mellitus with diabetic chronic kidney disease: Secondary | ICD-10-CM | POA: Diagnosis not present

## 2021-03-14 DIAGNOSIS — G2581 Restless legs syndrome: Secondary | ICD-10-CM | POA: Diagnosis not present

## 2021-03-14 DIAGNOSIS — N189 Chronic kidney disease, unspecified: Secondary | ICD-10-CM | POA: Diagnosis not present

## 2021-03-14 DIAGNOSIS — I129 Hypertensive chronic kidney disease with stage 1 through stage 4 chronic kidney disease, or unspecified chronic kidney disease: Secondary | ICD-10-CM | POA: Diagnosis not present

## 2021-03-15 ENCOUNTER — Ambulatory Visit (INDEPENDENT_AMBULATORY_CARE_PROVIDER_SITE_OTHER): Payer: Medicare Other | Admitting: Medical

## 2021-03-15 ENCOUNTER — Other Ambulatory Visit: Payer: Self-pay

## 2021-03-15 VITALS — BP 150/80 | HR 81 | Resp 18 | Ht 68.0 in | Wt 178.0 lb

## 2021-03-15 DIAGNOSIS — G2581 Restless legs syndrome: Secondary | ICD-10-CM

## 2021-03-15 DIAGNOSIS — R600 Localized edema: Secondary | ICD-10-CM

## 2021-03-15 DIAGNOSIS — Z79899 Other long term (current) drug therapy: Secondary | ICD-10-CM | POA: Diagnosis not present

## 2021-03-15 DIAGNOSIS — G629 Polyneuropathy, unspecified: Secondary | ICD-10-CM | POA: Diagnosis not present

## 2021-03-15 MED ORDER — TRAMADOL HCL 50 MG PO TABS: ORAL_TABLET | ORAL | 0 refills | Status: DC

## 2021-03-15 NOTE — Patient Instructions (Signed)
History of restless leg syndrome and neuropathy.  Pain despite combination treatment of tramadol, Lyrica and Requip and recently neurologist added on Trileptal.  On review of note looks like IV iron may be tried in the near future.  They have not called you after that visit a month ago so I put in the referral in effort to expedite that follow-up appointment/treatment.  History of tramadol use and we need to update your urine drug screen today.  Refilled her tramadol today.  Sent to local pharmacy at [facility].  Chronic intermittent pedal edema left lower extremity.  Numerous ultrasounds in recent past have been negative.  Presently signs and symptoms not worrisome for a DVT.  Today ultrasound not indicated.  We will continue to follow closely.  Recent skin infection resolved after doxycycline use and Unna boot.  Note further Unna boot  needed.needed however do want you to see [facility] to be fitting for custom compression. Home health currently reacing out to [facility] to follow up.  Follow up 1 month or sooner if needed

## 2021-03-15 NOTE — Progress Notes (Signed)
Subjective:    Patient ID: Brandon Mason, male    DOB: Aug 09, 1925, 87 y.o.   MRN: [id number]  HPI  Pt in for follow up.  Pt left leg swelling much improved(appeared had skin infection). Pt has had unaboot on for 2 weeks. Has been on doxy.  2 weeks ago pt had weeping area on left lower ext. Culture showed.  A mix of organisms of questionable significance was recovered on culture and not further identified. (Note: Growth did not detect the presence of S.aureus, beta-hemolytic Streptococci or P.aeruginosa).  No fever, no chills or sweats. No sob. No popliteal pain.  Various [place]  Pt has restless leg and neuropathy.  Pt had very dfficult time at church with both conditions. Had symptoms in both legs.Had to get up and leave church. This occurs about 3 times a month. Bothered him for about 8 hours. Pt states he took extra tramadol, extra half requip and advil.   Pt sees Dr. Ermalene Postin.    Impression:  Impression: 87 year old male continues to have refractory restless leg syndromes usually worse in the afternoons has to walk at night. He is having some problems becoming refractory to medication. Never got the IV iron to be tried. Discussed several options we will see if we can try Trileptal if it is helpful we will discontinue the Lyrica. Discussed some nonpharmacological treatments.  Does not seem to be very symptomatic. Spinal stenosis and degenerative disc disease. ?  Plan: Add Trileptal first 150 mg twice a day if it is helping we may increase it further and discontinue the Lyrica. Once again we will try to give a one-time IV iron. Discussed risk benefits of treatment.  (G25.81) Restless legs syndrome  (E11.9) Controlled type 2 diabetes mellitus without complication, without long-term current use of insulin (HCC)  (M48.061) Spinal stenosis of lumbar region without neurogenic claudication   Call or go to ER if any acute changes.  Safety and side effects discussed. All question  answered.    For neuropathy uses tramadol 90 tabs a month.   Review of Systems  Constitutional:  Negative for chills, fatigue and fever.  Respiratory:  Negative for cough, chest tightness, shortness of breath and wheezing.   Cardiovascular:  Negative for chest pain and palpitations.  Gastrointestinal:  Negative for abdominal distention, abdominal pain and anal bleeding.  Genitourinary:  Negative for dysuria, flank pain and frequency.  Musculoskeletal:  Negative for back pain.       No popliteal pain.  Skin:        See hpi.    Past Medical History:  Diagnosis Date   Abnormality of gait 02/09/2013   Arthritis    Cancer (HCC)    basal cell skin cancer   Cardiomegaly    Chronic kidney disease    BPH   Chronic leg pain    Hypersomnia    Hypertension    Hypoxia    pulmonary   Neuromuscular disorder (HCC)    restless leg syndrome    Nocturia    Prostate disorder    Restless legs syndrome (RLS) 02/09/2013   Restless legs syndrome with nocturnal myoclonus    RLS (restless legs syndrome)    x 40 years   Shortness of breath    with exertion    Sleep apnea    uses cpap   Stroke [facility])    minor stroke in 2003 - no lasting side effects   UARS (upper airway resistance syndrome) 06/28/2014     Social  History   Socioeconomic History   Marital status: Married    Spouse name: [name]   Number of children: 5   Years of education: 12   Highest education level: Not on file  Occupational History   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   Patient is retired.    Patient has 5 children.   Patient has a  high school education.   Patient is right-handed.   Patient drinks very little caffeine.   Social Determinants of Health   Financial Resource Strain: Not on file  Food Insecurity: Not on file  Transportation Needs: Not on file  Physical Activity: Not on file  Stress: Not on file  Social Connections: Not on file  Intimate Partner Violence: Not on file    Past Surgical History:  Procedure Laterality Date   ANTERIOR CERVICAL DECOMP/DISCECTOMY FUSION N/A 03/29/2016   Procedure: ANTERIOR CERVICAL DECOMPRESSION/DISCECTOMY FUSION CERVICAL THREE- CERVICAL FOUR;  Surgeon: Ashok Pall, MD;  Location: [place] [facility];  Service: Neurosurgery;  Laterality: N/A;   BACK SURGERY  2003   L4-5   CATARACT EXTRACTION Bilateral    HERNIA REPAIR     inguinal hernia   TRANSURETHRAL PROSTATECTOMY WITH GYRUS INSTRUMENTS  04/28/2012   Procedure: TRANSURETHRAL PROSTATECTOMY WITH GYRUS INSTRUMENTS;  Surgeon: Fredricka Bonine, MD;  Location: [facility];  Service: Urology;  Laterality: N/A;   TRANSURETHRAL RESECTION OF PROSTATE  03/24/2012   Procedure: TRANSURETHRAL RESECTION OF THE PROSTATE (TURP);  Surgeon: Fredricka Bonine, MD;  Location: [facility];  Service: Urology;  Laterality: N/A;  with gyrus ,Greenlight PVP laser of Prostate    Family History  Problem Relation Age of Onset   Restless legs syndrome Mother    Restless legs syndrome Daughter    Esophageal cancer Neg Hx    Colon cancer Neg Hx    Pancreatic cancer Neg Hx    Stomach cancer Neg Hx     No Known Allergies  Current Outpatient Medications on File Prior to Visit  Medication Sig Dispense Refill   amLODipine (NORVASC) 10 MG tablet TAKE 1 TABLET BY MOUTH  DAILY 90 tablet 3   doxycycline (VIBRA-TABS) 100 MG tablet Take 1 tablet (100 mg total) by mouth 2 (two) times daily. Can give caps or generic 14 tablet 0   doxycycline (VIBRA-TABS) 100 MG tablet Take 1 tablet (100 mg total) by mouth 2 (two) times daily. 8 tablet 0   finasteride  (PROSCAR) 5 MG tablet TAKE 1 TABLET BY MOUTH  DAILY 90 tablet 3   hydrochlorothiazide (MICROZIDE) 12.5 MG capsule 1 tab po today and repeat on Friday. 2 capsule 0   lisinopril (ZESTRIL) 20 MG tablet TAKE 1 TABLET BY MOUTH  DAILY 90 tablet 3   mupirocin ointment (BACTROBAN) 2 % APPLY  OINTMENT TOPICALLY TO AFFECTED AREA TWICE DAILY 22 g 0   nystatin cream (MYCOSTATIN) APPLY  CREAM TOPICALLY TO AFFECTED AREA TWICE DAILY 30 g 0   OXcarbazepine (TRILEPTAL) 150 MG tablet Take by mouth.  pregabalin (LYRICA) 150 MG capsule Take 150 mg by mouth 2 (two) times daily.     rOPINIRole (REQUIP) 4 MG tablet TAKE 1 TABLET BY MOUTH AT  BEDTIME 90 tablet 3   traMADol (ULTRAM) 50 MG tablet Take 1 tablet (50 mg total) by mouth every 8 (eight) hours as needed. for pain 45 tablet 0   Current Facility-Administered Medications on File Prior to Visit  Medication Dose Route Frequency Provider Last Rate Last Admin   cyanocobalamin ((VITAMIN B-12)) injection 1,000 mcg  1,000 mcg Intramuscular Once Airika Alkhatib, [name]        BP (!) 150/80   Pulse 81   Resp 18   Ht '5\' 8"'$  (1.727 m)   Wt 178 lb (80.7 kg)   SpO2 96%   BMI 27.06 kg/m       Objective:   Physical Exam  General Mental Status- Alert. General Appearance- Not in acute distress.   Skin General: Color- Normal Color. Moisture- Normal Moisture.  Neck Carotid Arteries- Normal color. Moisture- Normal Moisture. No carotid bruits. No JVD.  Chest and Lung Exam Auscultation: Breath Sounds:-Normal.  Cardiovascular Auscultation:Rythm- Regular. Murmurs & Other Heart Sounds:Auscultation of the heart reveals- No Murmurs.  Abdomen Inspection:-Inspeection Normal. Palpation/Percussion:Note:No mass. Palpation and Percussion of the abdomen reveal- Non Tender, Non Distended + BS, no rebound or guarding.   Neurologic Cranial Nerve exam:- CN III-XII intact(No nystagmus), symmetric smile. Strength:- 5/5 equal and symmetric strength both upper and lower  extremities.   Left lower ext- swelling has gone down. No weeping wounds. No breakdown.  Resolved left lower extremity pretibial edema and negative Homans' sign.  No pain on palpation of the popliteal fossa.     Assessment & Plan:   Patient Instructions  History of restless leg syndrome and neuropathy.  Pain despite combination treatment of tramadol, Lyrica and Requip and recently neurologist added on Trileptal.  On review of note looks like IV iron may be tried in the near future.  They have not called you after that visit a month ago so I put in the referral in effort to expedite that follow-up appointment/treatment.  History of tramadol use and we need to update your urine drug screen today.  Refilled her tramadol today.  Sent to local pharmacy at [facility].  Chronic intermittent pedal edema left lower extremity.  Numerous ultrasounds in recent past have been negative.  Presently signs and symptoms not worrisome for a DVT.  Today ultrasound not indicated.  We will continue to follow closely.  Recent skin infection resolved after doxycycline use and Unna boot.  Note further Unna boot  needed.needed however do want you to see [facility] to be fitting for custom compression. Home health currently reacing out to [facility] to follow up.  Follow up 1 month or sooner if needed   [employer], [name]

## 2021-03-16 DIAGNOSIS — H9190 Unspecified hearing loss, unspecified ear: Secondary | ICD-10-CM | POA: Diagnosis not present

## 2021-03-16 DIAGNOSIS — N189 Chronic kidney disease, unspecified: Secondary | ICD-10-CM | POA: Diagnosis not present

## 2021-03-16 DIAGNOSIS — Z85828 Personal history of other malignant neoplasm of skin: Secondary | ICD-10-CM | POA: Diagnosis not present

## 2021-03-16 DIAGNOSIS — M5126 Other intervertebral disc displacement, lumbar region: Secondary | ICD-10-CM | POA: Diagnosis not present

## 2021-03-16 DIAGNOSIS — L03116 Cellulitis of left lower limb: Secondary | ICD-10-CM | POA: Diagnosis not present

## 2021-03-16 DIAGNOSIS — Z79891 Long term (current) use of opiate analgesic: Secondary | ICD-10-CM | POA: Diagnosis not present

## 2021-03-16 DIAGNOSIS — Z8673 Personal history of transient ischemic attack (TIA), and cerebral infarction without residual deficits: Secondary | ICD-10-CM | POA: Diagnosis not present

## 2021-03-16 DIAGNOSIS — G4733 Obstructive sleep apnea (adult) (pediatric): Secondary | ICD-10-CM | POA: Diagnosis not present

## 2021-03-16 DIAGNOSIS — G2581 Restless legs syndrome: Secondary | ICD-10-CM | POA: Diagnosis not present

## 2021-03-16 DIAGNOSIS — Z9181 History of falling: Secondary | ICD-10-CM | POA: Diagnosis not present

## 2021-03-16 DIAGNOSIS — E1122 Type 2 diabetes mellitus with diabetic chronic kidney disease: Secondary | ICD-10-CM | POA: Diagnosis not present

## 2021-03-16 DIAGNOSIS — Z7982 Long term (current) use of aspirin: Secondary | ICD-10-CM | POA: Diagnosis not present

## 2021-03-16 DIAGNOSIS — I998 Other disorder of circulatory system: Secondary | ICD-10-CM | POA: Diagnosis not present

## 2021-03-16 DIAGNOSIS — N400 Enlarged prostate without lower urinary tract symptoms: Secondary | ICD-10-CM | POA: Diagnosis not present

## 2021-03-16 DIAGNOSIS — I129 Hypertensive chronic kidney disease with stage 1 through stage 4 chronic kidney disease, or unspecified chronic kidney disease: Secondary | ICD-10-CM | POA: Diagnosis not present

## 2021-03-18 LAB — DRUG MONITORING PANEL 376104, URINE
Amphetamines: NEGATIVE ng/mL (ref <500)
Barbiturates: NEGATIVE ng/mL (ref <300)
Benzodiazepines: NEGATIVE ng/mL (ref <100)
Cocaine Metabolite: NEGATIVE ng/mL (ref <150)
Desmethyltramadol: 2933 ng/mL — ABNORMAL HIGH (ref <100)
Opiates: NEGATIVE ng/mL (ref <100)
Oxycodone: NEGATIVE ng/mL (ref <100)
Tramadol: 5253 ng/mL — ABNORMAL HIGH (ref <100)

## 2021-03-18 LAB — DM TEMPLATE

## 2021-03-21 ENCOUNTER — Encounter: Payer: Self-pay | Admitting: Medical

## 2021-03-22 DIAGNOSIS — I998 Other disorder of circulatory system: Secondary | ICD-10-CM | POA: Diagnosis not present

## 2021-03-22 DIAGNOSIS — G2581 Restless legs syndrome: Secondary | ICD-10-CM | POA: Diagnosis not present

## 2021-03-22 DIAGNOSIS — L03116 Cellulitis of left lower limb: Secondary | ICD-10-CM | POA: Diagnosis not present

## 2021-03-22 DIAGNOSIS — N189 Chronic kidney disease, unspecified: Secondary | ICD-10-CM | POA: Diagnosis not present

## 2021-03-22 DIAGNOSIS — I129 Hypertensive chronic kidney disease with stage 1 through stage 4 chronic kidney disease, or unspecified chronic kidney disease: Secondary | ICD-10-CM | POA: Diagnosis not present

## 2021-03-22 DIAGNOSIS — E1122 Type 2 diabetes mellitus with diabetic chronic kidney disease: Secondary | ICD-10-CM | POA: Diagnosis not present

## 2021-03-23 ENCOUNTER — Telehealth: Payer: Self-pay | Admitting: Medical

## 2021-03-23 NOTE — Telephone Encounter (Signed)
[phone]  Will you call [facility] on Monday. Give them number of pt and daughter to coordinate appointment for order just faxed.

## 2021-03-26 NOTE — Telephone Encounter (Signed)
Order faxed Friday and sent to scan

## 2021-03-28 ENCOUNTER — Telehealth: Payer: Self-pay | Admitting: Medical

## 2021-03-28 NOTE — Telephone Encounter (Signed)
[facility] called to let us know that the order faxed over to them was too dark and they cannot read what it says. They asked for the order to be resent, or verbal orders can be given. [phone]. Please advice.

## 2021-03-28 NOTE — Telephone Encounter (Signed)
Prescription was sent to scan , not in chart yet.   Can give VO to [facility] or resend the script again but somehow make it lighter , please advise

## 2021-03-29 NOTE — Telephone Encounter (Signed)
VO given.

## 2021-03-30 DIAGNOSIS — I998 Other disorder of circulatory system: Secondary | ICD-10-CM | POA: Diagnosis not present

## 2021-03-30 DIAGNOSIS — G2581 Restless legs syndrome: Secondary | ICD-10-CM | POA: Diagnosis not present

## 2021-03-30 DIAGNOSIS — E1122 Type 2 diabetes mellitus with diabetic chronic kidney disease: Secondary | ICD-10-CM | POA: Diagnosis not present

## 2021-03-30 DIAGNOSIS — N189 Chronic kidney disease, unspecified: Secondary | ICD-10-CM | POA: Diagnosis not present

## 2021-03-30 DIAGNOSIS — I129 Hypertensive chronic kidney disease with stage 1 through stage 4 chronic kidney disease, or unspecified chronic kidney disease: Secondary | ICD-10-CM | POA: Diagnosis not present

## 2021-03-30 DIAGNOSIS — L03116 Cellulitis of left lower limb: Secondary | ICD-10-CM | POA: Diagnosis not present

## 2021-04-02 ENCOUNTER — Other Ambulatory Visit: Payer: Self-pay | Admitting: Medical

## 2021-04-06 DIAGNOSIS — G2581 Restless legs syndrome: Secondary | ICD-10-CM | POA: Diagnosis not present

## 2021-04-06 DIAGNOSIS — Z8673 Personal history of transient ischemic attack (TIA), and cerebral infarction without residual deficits: Secondary | ICD-10-CM | POA: Diagnosis not present

## 2021-04-06 DIAGNOSIS — I10 Essential (primary) hypertension: Secondary | ICD-10-CM | POA: Diagnosis not present

## 2021-04-06 DIAGNOSIS — B351 Tinea unguium: Secondary | ICD-10-CM | POA: Diagnosis not present

## 2021-04-06 DIAGNOSIS — Z7189 Other specified counseling: Secondary | ICD-10-CM | POA: Diagnosis not present

## 2021-04-11 ENCOUNTER — Other Ambulatory Visit: Payer: Self-pay | Admitting: Medical

## 2021-04-11 DIAGNOSIS — I129 Hypertensive chronic kidney disease with stage 1 through stage 4 chronic kidney disease, or unspecified chronic kidney disease: Secondary | ICD-10-CM | POA: Diagnosis not present

## 2021-04-11 DIAGNOSIS — G2581 Restless legs syndrome: Secondary | ICD-10-CM | POA: Diagnosis not present

## 2021-04-11 DIAGNOSIS — N189 Chronic kidney disease, unspecified: Secondary | ICD-10-CM | POA: Diagnosis not present

## 2021-04-11 DIAGNOSIS — E1122 Type 2 diabetes mellitus with diabetic chronic kidney disease: Secondary | ICD-10-CM | POA: Diagnosis not present

## 2021-04-11 DIAGNOSIS — L03116 Cellulitis of left lower limb: Secondary | ICD-10-CM | POA: Diagnosis not present

## 2021-04-11 DIAGNOSIS — I998 Other disorder of circulatory system: Secondary | ICD-10-CM | POA: Diagnosis not present

## 2021-04-12 DIAGNOSIS — G2581 Restless legs syndrome: Secondary | ICD-10-CM | POA: Diagnosis not present

## 2021-04-12 DIAGNOSIS — I129 Hypertensive chronic kidney disease with stage 1 through stage 4 chronic kidney disease, or unspecified chronic kidney disease: Secondary | ICD-10-CM | POA: Diagnosis not present

## 2021-04-12 DIAGNOSIS — L03116 Cellulitis of left lower limb: Secondary | ICD-10-CM | POA: Diagnosis not present

## 2021-04-12 DIAGNOSIS — I998 Other disorder of circulatory system: Secondary | ICD-10-CM | POA: Diagnosis not present

## 2021-04-12 DIAGNOSIS — E1122 Type 2 diabetes mellitus with diabetic chronic kidney disease: Secondary | ICD-10-CM | POA: Diagnosis not present

## 2021-04-12 DIAGNOSIS — N189 Chronic kidney disease, unspecified: Secondary | ICD-10-CM | POA: Diagnosis not present

## 2021-04-13 DIAGNOSIS — L03116 Cellulitis of left lower limb: Secondary | ICD-10-CM | POA: Diagnosis not present

## 2021-04-13 DIAGNOSIS — G2581 Restless legs syndrome: Secondary | ICD-10-CM | POA: Diagnosis not present

## 2021-04-13 DIAGNOSIS — N189 Chronic kidney disease, unspecified: Secondary | ICD-10-CM | POA: Diagnosis not present

## 2021-04-13 DIAGNOSIS — I998 Other disorder of circulatory system: Secondary | ICD-10-CM | POA: Diagnosis not present

## 2021-04-13 DIAGNOSIS — I129 Hypertensive chronic kidney disease with stage 1 through stage 4 chronic kidney disease, or unspecified chronic kidney disease: Secondary | ICD-10-CM | POA: Diagnosis not present

## 2021-04-13 DIAGNOSIS — E1122 Type 2 diabetes mellitus with diabetic chronic kidney disease: Secondary | ICD-10-CM | POA: Diagnosis not present

## 2021-04-15 DIAGNOSIS — N189 Chronic kidney disease, unspecified: Secondary | ICD-10-CM | POA: Diagnosis not present

## 2021-04-15 DIAGNOSIS — Z8673 Personal history of transient ischemic attack (TIA), and cerebral infarction without residual deficits: Secondary | ICD-10-CM | POA: Diagnosis not present

## 2021-04-15 DIAGNOSIS — Z7982 Long term (current) use of aspirin: Secondary | ICD-10-CM | POA: Diagnosis not present

## 2021-04-15 DIAGNOSIS — M5126 Other intervertebral disc displacement, lumbar region: Secondary | ICD-10-CM | POA: Diagnosis not present

## 2021-04-15 DIAGNOSIS — G4733 Obstructive sleep apnea (adult) (pediatric): Secondary | ICD-10-CM | POA: Diagnosis not present

## 2021-04-15 DIAGNOSIS — I998 Other disorder of circulatory system: Secondary | ICD-10-CM | POA: Diagnosis not present

## 2021-04-15 DIAGNOSIS — Z9181 History of falling: Secondary | ICD-10-CM | POA: Diagnosis not present

## 2021-04-15 DIAGNOSIS — Z85828 Personal history of other malignant neoplasm of skin: Secondary | ICD-10-CM | POA: Diagnosis not present

## 2021-04-15 DIAGNOSIS — I129 Hypertensive chronic kidney disease with stage 1 through stage 4 chronic kidney disease, or unspecified chronic kidney disease: Secondary | ICD-10-CM | POA: Diagnosis not present

## 2021-04-15 DIAGNOSIS — N400 Enlarged prostate without lower urinary tract symptoms: Secondary | ICD-10-CM | POA: Diagnosis not present

## 2021-04-15 DIAGNOSIS — H9190 Unspecified hearing loss, unspecified ear: Secondary | ICD-10-CM | POA: Diagnosis not present

## 2021-04-15 DIAGNOSIS — G2581 Restless legs syndrome: Secondary | ICD-10-CM | POA: Diagnosis not present

## 2021-04-15 DIAGNOSIS — E1122 Type 2 diabetes mellitus with diabetic chronic kidney disease: Secondary | ICD-10-CM | POA: Diagnosis not present

## 2021-04-15 DIAGNOSIS — Z79891 Long term (current) use of opiate analgesic: Secondary | ICD-10-CM | POA: Diagnosis not present

## 2021-04-18 DIAGNOSIS — M5126 Other intervertebral disc displacement, lumbar region: Secondary | ICD-10-CM | POA: Diagnosis not present

## 2021-04-18 DIAGNOSIS — E1122 Type 2 diabetes mellitus with diabetic chronic kidney disease: Secondary | ICD-10-CM | POA: Diagnosis not present

## 2021-04-18 DIAGNOSIS — G2581 Restless legs syndrome: Secondary | ICD-10-CM | POA: Diagnosis not present

## 2021-04-18 DIAGNOSIS — I129 Hypertensive chronic kidney disease with stage 1 through stage 4 chronic kidney disease, or unspecified chronic kidney disease: Secondary | ICD-10-CM | POA: Diagnosis not present

## 2021-04-18 DIAGNOSIS — N189 Chronic kidney disease, unspecified: Secondary | ICD-10-CM | POA: Diagnosis not present

## 2021-04-18 DIAGNOSIS — I998 Other disorder of circulatory system: Secondary | ICD-10-CM | POA: Diagnosis not present

## 2021-04-19 ENCOUNTER — Telehealth: Payer: Self-pay | Admitting: Medical

## 2021-04-19 DIAGNOSIS — E1122 Type 2 diabetes mellitus with diabetic chronic kidney disease: Secondary | ICD-10-CM | POA: Diagnosis not present

## 2021-04-19 DIAGNOSIS — N189 Chronic kidney disease, unspecified: Secondary | ICD-10-CM | POA: Diagnosis not present

## 2021-04-19 DIAGNOSIS — I998 Other disorder of circulatory system: Secondary | ICD-10-CM | POA: Diagnosis not present

## 2021-04-19 DIAGNOSIS — M5126 Other intervertebral disc displacement, lumbar region: Secondary | ICD-10-CM | POA: Diagnosis not present

## 2021-04-19 DIAGNOSIS — I129 Hypertensive chronic kidney disease with stage 1 through stage 4 chronic kidney disease, or unspecified chronic kidney disease: Secondary | ICD-10-CM | POA: Diagnosis not present

## 2021-04-19 DIAGNOSIS — G2581 Restless legs syndrome: Secondary | ICD-10-CM | POA: Diagnosis not present

## 2021-04-19 NOTE — Telephone Encounter (Signed)
I saw request from home health to me to adjust bp med to help with pt pedal edema.  I had last talked with [facility] to coordinate pt getting special compression wraps fitted by [facility]. What came of that?  For me to adjust lisinopril down to 10 mg and rx hctz 12.5 would need cmp first as last lab I seen done by [facility] showed low potassium. Diuretics can deplete potassium.  So update me on the above. If [facility] can get me cmp/potassium level then can make changes.  If you would call Hardie Lora at [facility] [phone]

## 2021-04-20 NOTE — Telephone Encounter (Signed)
Spoke with Brandon Mason since Brandon Mason was in the field , patient was discharged from nursing last week but is still doing Physical Therapy, they will go out next week and do an evaluation and draw labs and then fax over the results from the lab when they come back.

## 2021-04-23 DIAGNOSIS — E1122 Type 2 diabetes mellitus with diabetic chronic kidney disease: Secondary | ICD-10-CM | POA: Diagnosis not present

## 2021-04-23 DIAGNOSIS — M5126 Other intervertebral disc displacement, lumbar region: Secondary | ICD-10-CM | POA: Diagnosis not present

## 2021-04-23 DIAGNOSIS — I998 Other disorder of circulatory system: Secondary | ICD-10-CM | POA: Diagnosis not present

## 2021-04-23 DIAGNOSIS — N189 Chronic kidney disease, unspecified: Secondary | ICD-10-CM | POA: Diagnosis not present

## 2021-04-23 DIAGNOSIS — I129 Hypertensive chronic kidney disease with stage 1 through stage 4 chronic kidney disease, or unspecified chronic kidney disease: Secondary | ICD-10-CM | POA: Diagnosis not present

## 2021-04-23 DIAGNOSIS — G2581 Restless legs syndrome: Secondary | ICD-10-CM | POA: Diagnosis not present

## 2021-04-24 DIAGNOSIS — M5126 Other intervertebral disc displacement, lumbar region: Secondary | ICD-10-CM | POA: Diagnosis not present

## 2021-04-24 DIAGNOSIS — I998 Other disorder of circulatory system: Secondary | ICD-10-CM | POA: Diagnosis not present

## 2021-04-24 DIAGNOSIS — N189 Chronic kidney disease, unspecified: Secondary | ICD-10-CM | POA: Diagnosis not present

## 2021-04-24 DIAGNOSIS — E1122 Type 2 diabetes mellitus with diabetic chronic kidney disease: Secondary | ICD-10-CM | POA: Diagnosis not present

## 2021-04-24 DIAGNOSIS — I129 Hypertensive chronic kidney disease with stage 1 through stage 4 chronic kidney disease, or unspecified chronic kidney disease: Secondary | ICD-10-CM | POA: Diagnosis not present

## 2021-04-24 DIAGNOSIS — G2581 Restless legs syndrome: Secondary | ICD-10-CM | POA: Diagnosis not present

## 2021-04-25 NOTE — Telephone Encounter (Signed)
error 

## 2021-04-26 DIAGNOSIS — E1122 Type 2 diabetes mellitus with diabetic chronic kidney disease: Secondary | ICD-10-CM | POA: Diagnosis not present

## 2021-04-26 DIAGNOSIS — N189 Chronic kidney disease, unspecified: Secondary | ICD-10-CM | POA: Diagnosis not present

## 2021-04-26 DIAGNOSIS — G2581 Restless legs syndrome: Secondary | ICD-10-CM | POA: Diagnosis not present

## 2021-04-26 DIAGNOSIS — I129 Hypertensive chronic kidney disease with stage 1 through stage 4 chronic kidney disease, or unspecified chronic kidney disease: Secondary | ICD-10-CM | POA: Diagnosis not present

## 2021-04-26 DIAGNOSIS — M5126 Other intervertebral disc displacement, lumbar region: Secondary | ICD-10-CM | POA: Diagnosis not present

## 2021-04-26 DIAGNOSIS — I998 Other disorder of circulatory system: Secondary | ICD-10-CM | POA: Diagnosis not present

## 2021-05-01 ENCOUNTER — Other Ambulatory Visit: Payer: Self-pay | Admitting: Medical

## 2021-05-02 ENCOUNTER — Other Ambulatory Visit: Payer: Self-pay | Admitting: Medical

## 2021-05-02 NOTE — Telephone Encounter (Addendum)
Contract 03/23/21 Uds:03/15/21 Last refill : 03/15/21  Rx refill sent to pharmacy.  Mackie Pai, [name]

## 2021-05-03 DIAGNOSIS — I998 Other disorder of circulatory system: Secondary | ICD-10-CM | POA: Diagnosis not present

## 2021-05-03 DIAGNOSIS — N189 Chronic kidney disease, unspecified: Secondary | ICD-10-CM | POA: Diagnosis not present

## 2021-05-03 DIAGNOSIS — G2581 Restless legs syndrome: Secondary | ICD-10-CM | POA: Diagnosis not present

## 2021-05-03 DIAGNOSIS — I129 Hypertensive chronic kidney disease with stage 1 through stage 4 chronic kidney disease, or unspecified chronic kidney disease: Secondary | ICD-10-CM | POA: Diagnosis not present

## 2021-05-03 DIAGNOSIS — E1122 Type 2 diabetes mellitus with diabetic chronic kidney disease: Secondary | ICD-10-CM | POA: Diagnosis not present

## 2021-05-03 DIAGNOSIS — M5126 Other intervertebral disc displacement, lumbar region: Secondary | ICD-10-CM | POA: Diagnosis not present

## 2021-05-04 DIAGNOSIS — M5126 Other intervertebral disc displacement, lumbar region: Secondary | ICD-10-CM | POA: Diagnosis not present

## 2021-05-04 DIAGNOSIS — G2581 Restless legs syndrome: Secondary | ICD-10-CM | POA: Diagnosis not present

## 2021-05-04 DIAGNOSIS — I998 Other disorder of circulatory system: Secondary | ICD-10-CM | POA: Diagnosis not present

## 2021-05-04 DIAGNOSIS — N189 Chronic kidney disease, unspecified: Secondary | ICD-10-CM | POA: Diagnosis not present

## 2021-05-04 DIAGNOSIS — I129 Hypertensive chronic kidney disease with stage 1 through stage 4 chronic kidney disease, or unspecified chronic kidney disease: Secondary | ICD-10-CM | POA: Diagnosis not present

## 2021-05-04 DIAGNOSIS — E1122 Type 2 diabetes mellitus with diabetic chronic kidney disease: Secondary | ICD-10-CM | POA: Diagnosis not present

## 2021-05-07 DIAGNOSIS — G2581 Restless legs syndrome: Secondary | ICD-10-CM | POA: Diagnosis not present

## 2021-05-07 DIAGNOSIS — E1122 Type 2 diabetes mellitus with diabetic chronic kidney disease: Secondary | ICD-10-CM | POA: Diagnosis not present

## 2021-05-07 DIAGNOSIS — M5126 Other intervertebral disc displacement, lumbar region: Secondary | ICD-10-CM | POA: Diagnosis not present

## 2021-05-07 DIAGNOSIS — I998 Other disorder of circulatory system: Secondary | ICD-10-CM | POA: Diagnosis not present

## 2021-05-07 DIAGNOSIS — I129 Hypertensive chronic kidney disease with stage 1 through stage 4 chronic kidney disease, or unspecified chronic kidney disease: Secondary | ICD-10-CM | POA: Diagnosis not present

## 2021-05-07 DIAGNOSIS — N189 Chronic kidney disease, unspecified: Secondary | ICD-10-CM | POA: Diagnosis not present

## 2021-05-11 DIAGNOSIS — N189 Chronic kidney disease, unspecified: Secondary | ICD-10-CM | POA: Diagnosis not present

## 2021-05-11 DIAGNOSIS — E1122 Type 2 diabetes mellitus with diabetic chronic kidney disease: Secondary | ICD-10-CM | POA: Diagnosis not present

## 2021-05-11 DIAGNOSIS — G2581 Restless legs syndrome: Secondary | ICD-10-CM | POA: Diagnosis not present

## 2021-05-11 DIAGNOSIS — I129 Hypertensive chronic kidney disease with stage 1 through stage 4 chronic kidney disease, or unspecified chronic kidney disease: Secondary | ICD-10-CM | POA: Diagnosis not present

## 2021-05-11 DIAGNOSIS — I998 Other disorder of circulatory system: Secondary | ICD-10-CM | POA: Diagnosis not present

## 2021-05-11 DIAGNOSIS — M5126 Other intervertebral disc displacement, lumbar region: Secondary | ICD-10-CM | POA: Diagnosis not present

## 2021-05-15 DIAGNOSIS — G894 Chronic pain syndrome: Secondary | ICD-10-CM | POA: Diagnosis not present

## 2021-05-15 DIAGNOSIS — H9190 Unspecified hearing loss, unspecified ear: Secondary | ICD-10-CM | POA: Diagnosis not present

## 2021-05-15 DIAGNOSIS — Z8673 Personal history of transient ischemic attack (TIA), and cerebral infarction without residual deficits: Secondary | ICD-10-CM | POA: Diagnosis not present

## 2021-05-15 DIAGNOSIS — Z85828 Personal history of other malignant neoplasm of skin: Secondary | ICD-10-CM | POA: Diagnosis not present

## 2021-05-15 DIAGNOSIS — Z7982 Long term (current) use of aspirin: Secondary | ICD-10-CM | POA: Diagnosis not present

## 2021-05-15 DIAGNOSIS — G4733 Obstructive sleep apnea (adult) (pediatric): Secondary | ICD-10-CM | POA: Diagnosis not present

## 2021-05-15 DIAGNOSIS — Z9181 History of falling: Secondary | ICD-10-CM | POA: Diagnosis not present

## 2021-05-15 DIAGNOSIS — N400 Enlarged prostate without lower urinary tract symptoms: Secondary | ICD-10-CM | POA: Diagnosis not present

## 2021-05-15 DIAGNOSIS — R600 Localized edema: Secondary | ICD-10-CM | POA: Diagnosis not present

## 2021-05-15 DIAGNOSIS — G2581 Restless legs syndrome: Secondary | ICD-10-CM | POA: Diagnosis not present

## 2021-05-15 DIAGNOSIS — M5126 Other intervertebral disc displacement, lumbar region: Secondary | ICD-10-CM | POA: Diagnosis not present

## 2021-05-15 DIAGNOSIS — I10 Essential (primary) hypertension: Secondary | ICD-10-CM | POA: Diagnosis not present

## 2021-05-15 DIAGNOSIS — I998 Other disorder of circulatory system: Secondary | ICD-10-CM | POA: Diagnosis not present

## 2021-05-15 DIAGNOSIS — E1122 Type 2 diabetes mellitus with diabetic chronic kidney disease: Secondary | ICD-10-CM | POA: Diagnosis not present

## 2021-05-15 DIAGNOSIS — I129 Hypertensive chronic kidney disease with stage 1 through stage 4 chronic kidney disease, or unspecified chronic kidney disease: Secondary | ICD-10-CM | POA: Diagnosis not present

## 2021-05-15 DIAGNOSIS — Z79891 Long term (current) use of opiate analgesic: Secondary | ICD-10-CM | POA: Diagnosis not present

## 2021-05-15 DIAGNOSIS — Z7902 Long term (current) use of antithrombotics/antiplatelets: Secondary | ICD-10-CM | POA: Diagnosis not present

## 2021-05-15 DIAGNOSIS — N189 Chronic kidney disease, unspecified: Secondary | ICD-10-CM | POA: Diagnosis not present

## 2021-05-16 DIAGNOSIS — E1122 Type 2 diabetes mellitus with diabetic chronic kidney disease: Secondary | ICD-10-CM | POA: Diagnosis not present

## 2021-05-16 DIAGNOSIS — M5126 Other intervertebral disc displacement, lumbar region: Secondary | ICD-10-CM | POA: Diagnosis not present

## 2021-05-16 DIAGNOSIS — I129 Hypertensive chronic kidney disease with stage 1 through stage 4 chronic kidney disease, or unspecified chronic kidney disease: Secondary | ICD-10-CM | POA: Diagnosis not present

## 2021-05-16 DIAGNOSIS — N189 Chronic kidney disease, unspecified: Secondary | ICD-10-CM | POA: Diagnosis not present

## 2021-05-16 DIAGNOSIS — I998 Other disorder of circulatory system: Secondary | ICD-10-CM | POA: Diagnosis not present

## 2021-05-16 DIAGNOSIS — G2581 Restless legs syndrome: Secondary | ICD-10-CM | POA: Diagnosis not present

## 2021-05-17 DIAGNOSIS — G2581 Restless legs syndrome: Secondary | ICD-10-CM | POA: Diagnosis not present

## 2021-05-17 DIAGNOSIS — I998 Other disorder of circulatory system: Secondary | ICD-10-CM | POA: Diagnosis not present

## 2021-05-17 DIAGNOSIS — M5126 Other intervertebral disc displacement, lumbar region: Secondary | ICD-10-CM | POA: Diagnosis not present

## 2021-05-17 DIAGNOSIS — N189 Chronic kidney disease, unspecified: Secondary | ICD-10-CM | POA: Diagnosis not present

## 2021-05-17 DIAGNOSIS — I129 Hypertensive chronic kidney disease with stage 1 through stage 4 chronic kidney disease, or unspecified chronic kidney disease: Secondary | ICD-10-CM | POA: Diagnosis not present

## 2021-05-17 DIAGNOSIS — E1122 Type 2 diabetes mellitus with diabetic chronic kidney disease: Secondary | ICD-10-CM | POA: Diagnosis not present

## 2021-05-20 DIAGNOSIS — I998 Other disorder of circulatory system: Secondary | ICD-10-CM | POA: Diagnosis not present

## 2021-05-20 DIAGNOSIS — I129 Hypertensive chronic kidney disease with stage 1 through stage 4 chronic kidney disease, or unspecified chronic kidney disease: Secondary | ICD-10-CM | POA: Diagnosis not present

## 2021-05-20 DIAGNOSIS — M5126 Other intervertebral disc displacement, lumbar region: Secondary | ICD-10-CM | POA: Diagnosis not present

## 2021-05-20 DIAGNOSIS — N189 Chronic kidney disease, unspecified: Secondary | ICD-10-CM | POA: Diagnosis not present

## 2021-05-20 DIAGNOSIS — G2581 Restless legs syndrome: Secondary | ICD-10-CM | POA: Diagnosis not present

## 2021-05-20 DIAGNOSIS — E1122 Type 2 diabetes mellitus with diabetic chronic kidney disease: Secondary | ICD-10-CM | POA: Diagnosis not present

## 2021-05-22 DIAGNOSIS — N189 Chronic kidney disease, unspecified: Secondary | ICD-10-CM | POA: Diagnosis not present

## 2021-05-22 DIAGNOSIS — G2581 Restless legs syndrome: Secondary | ICD-10-CM | POA: Diagnosis not present

## 2021-05-22 DIAGNOSIS — E1122 Type 2 diabetes mellitus with diabetic chronic kidney disease: Secondary | ICD-10-CM | POA: Diagnosis not present

## 2021-05-22 DIAGNOSIS — I998 Other disorder of circulatory system: Secondary | ICD-10-CM | POA: Diagnosis not present

## 2021-05-22 DIAGNOSIS — M5126 Other intervertebral disc displacement, lumbar region: Secondary | ICD-10-CM | POA: Diagnosis not present

## 2021-05-22 DIAGNOSIS — I129 Hypertensive chronic kidney disease with stage 1 through stage 4 chronic kidney disease, or unspecified chronic kidney disease: Secondary | ICD-10-CM | POA: Diagnosis not present

## 2021-05-30 DIAGNOSIS — I1310 Hypertensive heart and chronic kidney disease without heart failure, with stage 1 through stage 4 chronic kidney disease, or unspecified chronic kidney disease: Secondary | ICD-10-CM | POA: Diagnosis not present

## 2021-05-30 DIAGNOSIS — M5126 Other intervertebral disc displacement, lumbar region: Secondary | ICD-10-CM | POA: Diagnosis not present

## 2021-05-30 DIAGNOSIS — I129 Hypertensive chronic kidney disease with stage 1 through stage 4 chronic kidney disease, or unspecified chronic kidney disease: Secondary | ICD-10-CM | POA: Diagnosis not present

## 2021-05-30 DIAGNOSIS — I998 Other disorder of circulatory system: Secondary | ICD-10-CM | POA: Diagnosis not present

## 2021-05-30 DIAGNOSIS — I517 Cardiomegaly: Secondary | ICD-10-CM | POA: Diagnosis not present

## 2021-05-30 DIAGNOSIS — N189 Chronic kidney disease, unspecified: Secondary | ICD-10-CM | POA: Diagnosis not present

## 2021-05-30 DIAGNOSIS — G2581 Restless legs syndrome: Secondary | ICD-10-CM | POA: Diagnosis not present

## 2021-05-30 DIAGNOSIS — E1122 Type 2 diabetes mellitus with diabetic chronic kidney disease: Secondary | ICD-10-CM | POA: Diagnosis not present

## 2021-05-31 DIAGNOSIS — E1122 Type 2 diabetes mellitus with diabetic chronic kidney disease: Secondary | ICD-10-CM | POA: Diagnosis not present

## 2021-05-31 DIAGNOSIS — G2581 Restless legs syndrome: Secondary | ICD-10-CM | POA: Diagnosis not present

## 2021-05-31 DIAGNOSIS — I998 Other disorder of circulatory system: Secondary | ICD-10-CM | POA: Diagnosis not present

## 2021-05-31 DIAGNOSIS — I129 Hypertensive chronic kidney disease with stage 1 through stage 4 chronic kidney disease, or unspecified chronic kidney disease: Secondary | ICD-10-CM | POA: Diagnosis not present

## 2021-05-31 DIAGNOSIS — N189 Chronic kidney disease, unspecified: Secondary | ICD-10-CM | POA: Diagnosis not present

## 2021-05-31 DIAGNOSIS — M5126 Other intervertebral disc displacement, lumbar region: Secondary | ICD-10-CM | POA: Diagnosis not present

## 2021-06-04 DIAGNOSIS — G2581 Restless legs syndrome: Secondary | ICD-10-CM | POA: Diagnosis not present

## 2021-06-04 DIAGNOSIS — I129 Hypertensive chronic kidney disease with stage 1 through stage 4 chronic kidney disease, or unspecified chronic kidney disease: Secondary | ICD-10-CM | POA: Diagnosis not present

## 2021-06-04 DIAGNOSIS — M5126 Other intervertebral disc displacement, lumbar region: Secondary | ICD-10-CM | POA: Diagnosis not present

## 2021-06-04 DIAGNOSIS — Z23 Encounter for immunization: Secondary | ICD-10-CM | POA: Diagnosis not present

## 2021-06-04 DIAGNOSIS — E1122 Type 2 diabetes mellitus with diabetic chronic kidney disease: Secondary | ICD-10-CM | POA: Diagnosis not present

## 2021-06-04 DIAGNOSIS — I998 Other disorder of circulatory system: Secondary | ICD-10-CM | POA: Diagnosis not present

## 2021-06-04 DIAGNOSIS — N189 Chronic kidney disease, unspecified: Secondary | ICD-10-CM | POA: Diagnosis not present

## 2021-06-05 ENCOUNTER — Telehealth: Payer: Self-pay | Admitting: Medical

## 2021-06-05 NOTE — Telephone Encounter (Signed)
Spoke with patient daughter to schedule Medicare Annual Wellness Visit (AWV) either virtually or in office.  Call back 08/2021  patient spouse in hospice    **AWVS DUE 12/29/2013 PER PALMETTO  please schedule at anytime with health coach

## 2021-06-09 ENCOUNTER — Other Ambulatory Visit: Payer: Self-pay | Admitting: Medical

## 2021-06-30 DIAGNOSIS — I1310 Hypertensive heart and chronic kidney disease without heart failure, with stage 1 through stage 4 chronic kidney disease, or unspecified chronic kidney disease: Secondary | ICD-10-CM | POA: Diagnosis not present

## 2021-06-30 DIAGNOSIS — N189 Chronic kidney disease, unspecified: Secondary | ICD-10-CM | POA: Diagnosis not present

## 2021-06-30 DIAGNOSIS — I517 Cardiomegaly: Secondary | ICD-10-CM | POA: Diagnosis not present

## 2022-03-11 NOTE — Discharge Summary (Signed)
 ------------------------------------------------------------------------------- Attestation signed by Mason Brandon Malachi Prentice, MD at 03/12/2022  9:57 AM I saw and evaluated the patient and reviewed the resident's note. I agree with the resident's findings and plan. Total time spent with patient was 35 minutes.   Electronically Signed by: Brandon Alto, MD, Attending Physician 03/12/2022 9:57 AM  -------------------------------------------------------------------------------  General Medicine [place] [facility] Discharge Summary  Name: Brandon Mason MRN: [id number] Age: 88 yrs DOB: [birth year]  Admit date: 03/07/2022 Discharge date and time: 03/11/2022  Admitting Physician: Brandon Mason Alto, MD Discharge Physician: Brandon Alto, MD  Admission Diagnoses:  Pain [R52] Hypoxia [R09.02] Hypervolemia, unspecified hypervolemia type [E87.70]   Discharge Diagnoses:  Cellulitis of the right leg with infected hematoma  Admission Condition: fair Discharged Condition: good  Hospital Course:  For full details, please see H&P, progress notes, consult notes and ancillary notes. Briefly, Brandon Mason is a 88 y.o. male with PMH of PMH of CVA, hypertension, [facility]oarth[facility]isp[facility] found to have cellulitis with concern for abscess versus septic hematoma.  His hospital course will be summarized in a problem-based approach below.  #Cellulitis, POA, active #Infected hematoma with surrounding necrotic fat, POA, active  Patient reports that he sustained a mechanical fall about 3 weeks prior to presentation.  On 9/7, he notes that he had progressive right knee pain, edema, and redness.  In the ED, patient was afebrile and hemodynamically stable.  Labs were notable for the following: Leukocytosis 15.3, hemoglobin 9.8, platelet 161, creatinine 0.85.  Blood cultures were obtained and had been negative to date.  CT imaging of the leg was notable for diffuse  edema of the right leg without visible DVT.  It was also notable for a 9 x 7.8 x 3.9 cm subcutaneous fluid collection in the medial distal thigh (favored to be liquefied hematoma or seroma) me: There was no soft tissue gas or air visible.  Patient was initially treated with vancomycin.  He was evaluated by surgery and underwent I&D on 9/9.  During the procedure, they aspirated purulent fluid.  The patient tolerated the procedure well.  Wound cultures obtained were positive for S. Aureus.  On 9/11, patient was discharged with doxycycline  (end date 9/23--corresponds to 14 days following source control).  He will be discharged with a wound VAC and will need to have it changed every Monday, Wednesday, and Friday.   #History of multiple falls Patient has had numerous falls in the past.  Patient notes that he feels unsteady on his feet, but he otherwise denies presyncopal symptoms.  He is on a number of medications for his restless legs/leg pain that could be contributing to his unsteadiness.  The patient's family, however, is adamant that these doses are appropriate for his symptom control.  The patient was evaluated by PT/OT; they recommended home health.   On day of discharge, patient is clinically stable with no new examination findings or acute symptoms compared to prior.  The patient was seen by the attending physician on the date of discharge and deemed stable and acceptable for discharge.  The patient's chronic medical conditions were treated accordingly per the patient's home medication regimen.  The patient's medication reconciliation [with changes made to chronic medications], follow-up appointments, discharge orders, instructions and significant lab and diagnostic studies are as noted.     Discharge Follow-up Action Items: Please follow-up with your PCP this week. Please see below for a list of your updated medications.  For your cellulitis, you  will need to take doxycycline  100 mg twice daily  (end date 9/23). You will have a wound VAC.  It will need to be changed every 03-15-24, Wednesday, and Friday.  This will be coordinated with home health.  Surgery will arrange follow-up to see you. Please seek medical attention if you notice any worsening of your leg or if you fail to notice any improvement.   Patient's Ordered Code Status: DNR / Limited SOTO    Discharge Physical Examination: performed by Dr. Debby  General:WDWN. No acute distress. Appears stated age. Cardiac: normal s1/s2. RRR with no rubs, murmurs, or gallops. Radial pulses 2+ b/l Lungs: no increased WOB on room air. Clear to auscultation anteriorly with no wheezes, rales, or rhonchi. Abdomen: normoactive bowel sounds. No tenderness to palpation Extremities: warm and well perfused. 1-2+ bilateral lower extremity edema. RL is erythematous (improved from prior examination) with minimal warmth. Right thigh is covered with bandage from procedure.  Neuro: normocephalic. Atraumatic. Moves extremities spontaneously Psych: normal affect. No anxiety.    Consults: IP CONSULT TO HOSPITALIST (GENERAL) WOUND OSTOMY EVAL AND TREAT  Significant Diagnostic Studies:  CBC:  Recent Labs  Lab 2022-03-15 0521 03/09/22 0914 03/10/22 0834 03/11/22 0757  WBC 15.3* 19.4* 12.4* 9.8  HGB 9.8* 10.7* 9.9* 10.7*  HCT 29.0* 32.8* 30.2* 31.5*  PLT 161 155 191 234  , DIFF:  Recent Labs  Lab 03/07/22 2138/03/15  NEUTOPHILPCT 84  LYMPHOPCT 4  MONOPCT 11  BASOPCT 0  EOSPCT 0  NEUTROABS 15.1*  LYMPHSABS 0.8*  MONOSABS 2.1*  BASOSABS 0.1  EOSABS 0.1  , CMP:   Recent Labs  Lab 03/07/22 2138/03/15 March 15, 2022 0521 03/09/22 0914 03/10/22 0834 03/11/22 0757  NA 138 138 138 138 140  K 3.7 3.9 3.6 3.8 3.8  CL 104 107 105 105 106  CO2 26 26 24 29 29   BUN 33* 30* 29* 25* 26*  CREATININE 0.99 0.85 0.93 0.84 0.87  GLU 134* 114* 122* 118* 109*  CALCIUM  8.3* 8.0* 7.9* 8.1* 8.3*  MG 1.9 1.9 1.9 1.9 1.9  PHOS 2.6  --   --   --   --   BILITOT 0.6   --   --   --   --   AST 20  --   --   --   --   ALT 11  --   --   --   --   ALKPHOS 68  --   --   --   --   PROT 6.4  --   --   --   --   ALBUMIN 3.6  --   --   --   --   ANIONGAP 8 5 9 4 5   , Coags:  No results for input(s): LABPROT, INR, PTT, DFIB in the last 168 hours., Cardiac:  No results for input(s): TROPONINI, CK, CKMB, CKMBP, BNP in the last 72 hours., Glycemic control:    and Blood Gas: No results for input(s): PH, PCO2, PO2, HCO3, BDF, O2S in the last 168 hours.   Disposition: Home health PT/OT  Patient Instructions:    .    START taking these medications   doxycycline  hyclate 100 MG tablet Dose: 100 mg Instructions: Take 1 tablet (100 mg total) by mouth 2 times daily. End date: 03/23/2022.     CHANGE how you take these medications   pregabalin  150 MG capsule Commonly known as: LYRICA  What changed: Another medication with the same name was removed. Continue taking this  medication, and follow the directions you see here. Dose: 150 mg Instructions: Take 1 capsule (150 mg total) by mouth 2 times daily.     CONTINUE taking these medications   amLODIPine  10 MG tablet Commonly known as: NORVASC  Instructions: TAKE 1 TABLET BY MOUTH ONCE DAILY   finasteride  5 mg tablet Commonly known as: PROSCAR  Dose: 5 mg Instructions: Take 1 tablet (5 mg total) by mouth daily.   furosemide  20 MG tablet Commonly known as: LASIX  Dose: 20 mg Instructions: Take 1 tablet (20 mg total) by mouth daily.   lisinopriL  20 MG tablet Commonly known as: PRINIVIL ,ZESTRIL  Instructions: TAKE ONE TABLET BY MOUTH ONCE DAILY Comment: Patient needs appt for further refills   rOPINIRole  4 MG tablet Commonly known as: REQUIP  Dose: 4 mg Instructions: Take 1 tablet (4 mg total) by mouth nightly. Comment: Dosage change via phone call with pt 11/01/19.   traMADoL  50 mg tablet Commonly known as: ULTRAM  Dose: 50 mg Instructions: Take 1 tablet (50 mg total) by mouth 3  times daily. Comment: Dosage change via phone call with pt 11/01/19. You are taking this medication differently than prescribed. If you have questions about this medication, ask the prescribing provider: FORBES Custard, MD.     STOP taking these medications   aspirin  325 MG tablet  *ANTIPLATELET*   famotidine  20 MG tablet Commonly known as: PEPCID    lidocaine  5 % patch Commonly known as: LIDODERM    naproxen sodium 220 MG tablet Commonly known as: ALEVE   OXcarbazepine 150 MG tablet Commonly known as: TRILEPTAL         Discharge Orders and Instructions    Physical Therapy Prescription for Home Health   Complete by: As directed    Action(s):  Evaluate and Treat Home Safety Evaluation     Length of Need: 6 months   Occupational Prescription for Home Health   Complete by: As directed    Action(s): Evaluate and Treat   Length of Need: 6 months   Home Health Skilled Nurse - [facility] Dressing Change   Complete by: As directed    Dressing Change Frequency: three times a week   Brand: KCI      Follow-up    Contact information for after-discharge care    Home Medical Care    [facility] [place] .   Service: Home Health Services Contact information: [address] [place]  [phone]                  No future appointments.  Electronically signed by: Franky Carlin Courts, MD 03/11/2022    Electronically signed by: Courts Franky Carlin, MD Resident 03/11/22 [phone]    Electronically signed by: Mason Brandon Malachi Prentice, MD 03/12/22 [phone]

## 2022-03-13 ENCOUNTER — Telehealth: Payer: Self-pay

## 2022-03-13 NOTE — Telephone Encounter (Signed)
Transition Care Management Unsuccessful Follow-up Telephone Call  Date of discharge and from where:  03/11/22. [facility]  Attempts:  1st Attempt  Reason for unsuccessful TCM follow-up call:  No answer/busy

## 2022-03-14 NOTE — Telephone Encounter (Signed)
Transition Care Management Unsuccessful Follow-up Telephone Call  Date of discharge and from where: 03/11/22. [facility]  Attempts:  2nd Attempt  Reason for unsuccessful TCM follow-up call:  No answer/busy

## 2022-03-15 NOTE — Telephone Encounter (Signed)
Transition Care Management Unsuccessful Follow-up Telephone Call  Date of discharge and from where:  03/11/22. [facility]  Attempts:  3rd Attempt  Reason for unsuccessful TCM follow-up call:  Left voice message  Letter mailed to patient to call and schedule hospital f/u.

## 2022-03-16 ENCOUNTER — Emergency Department (HOSPITAL_COMMUNITY): Payer: Medicare HMO

## 2022-03-16 ENCOUNTER — Inpatient Hospital Stay (HOSPITAL_COMMUNITY)
Admission: EM | Admit: 2022-03-16 | Discharge: 2022-03-20 | DRG: 380 | Disposition: A | Discharge status: Home Health | Payer: Medicare HMO | Attending: Internal Medicine | Admitting: Internal Medicine

## 2022-03-16 ENCOUNTER — Other Ambulatory Visit: Payer: Self-pay

## 2022-03-16 ENCOUNTER — Encounter (HOSPITAL_COMMUNITY): Payer: Self-pay | Admitting: Emergency Medicine

## 2022-03-16 DIAGNOSIS — Z85828 Personal history of other malignant neoplasm of skin: Secondary | ICD-10-CM

## 2022-03-16 DIAGNOSIS — I21A1 Myocardial infarction type 2: Secondary | ICD-10-CM | POA: Diagnosis present

## 2022-03-16 DIAGNOSIS — N400 Enlarged prostate without lower urinary tract symptoms: Secondary | ICD-10-CM | POA: Diagnosis present

## 2022-03-16 DIAGNOSIS — B9561 Methicillin susceptible Staphylococcus aureus infection as the cause of diseases classified elsewhere: Secondary | ICD-10-CM | POA: Diagnosis present

## 2022-03-16 DIAGNOSIS — I10 Essential (primary) hypertension: Secondary | ICD-10-CM | POA: Diagnosis present

## 2022-03-16 DIAGNOSIS — Z66 Do not resuscitate: Secondary | ICD-10-CM

## 2022-03-16 DIAGNOSIS — L03115 Cellulitis of right lower limb: Secondary | ICD-10-CM | POA: Diagnosis present

## 2022-03-16 DIAGNOSIS — Z9989 Dependence on other enabling machines and devices: Secondary | ICD-10-CM | POA: Diagnosis not present

## 2022-03-16 DIAGNOSIS — Z791 Long term (current) use of non-steroidal anti-inflammatories (NSAID): Secondary | ICD-10-CM

## 2022-03-16 DIAGNOSIS — K2210 Ulcer of esophagus without bleeding: Secondary | ICD-10-CM

## 2022-03-16 DIAGNOSIS — K2971 Gastritis, unspecified, with bleeding: Secondary | ICD-10-CM | POA: Diagnosis present

## 2022-03-16 DIAGNOSIS — Z981 Arthrodesis status: Secondary | ICD-10-CM

## 2022-03-16 DIAGNOSIS — K2970 Gastritis, unspecified, without bleeding: Secondary | ICD-10-CM

## 2022-03-16 DIAGNOSIS — G894 Chronic pain syndrome: Secondary | ICD-10-CM | POA: Diagnosis present

## 2022-03-16 DIAGNOSIS — K2211 Ulcer of esophagus with bleeding: Secondary | ICD-10-CM | POA: Diagnosis present

## 2022-03-16 DIAGNOSIS — Z79899 Other long term (current) drug therapy: Secondary | ICD-10-CM | POA: Diagnosis not present

## 2022-03-16 DIAGNOSIS — D62 Acute posthemorrhagic anemia: Secondary | ICD-10-CM | POA: Diagnosis present

## 2022-03-16 DIAGNOSIS — K921 Melena: Secondary | ICD-10-CM

## 2022-03-16 DIAGNOSIS — I5032 Chronic diastolic (congestive) heart failure: Secondary | ICD-10-CM | POA: Diagnosis present

## 2022-03-16 DIAGNOSIS — I272 Pulmonary hypertension, unspecified: Secondary | ICD-10-CM | POA: Diagnosis present

## 2022-03-16 DIAGNOSIS — D649 Anemia, unspecified: Secondary | ICD-10-CM | POA: Diagnosis not present

## 2022-03-16 DIAGNOSIS — Z20822 Contact with and (suspected) exposure to covid-19: Secondary | ICD-10-CM | POA: Diagnosis present

## 2022-03-16 DIAGNOSIS — K209 Esophagitis, unspecified without bleeding: Secondary | ICD-10-CM | POA: Diagnosis not present

## 2022-03-16 DIAGNOSIS — I77810 Thoracic aortic ectasia: Secondary | ICD-10-CM | POA: Diagnosis present

## 2022-03-16 DIAGNOSIS — I440 Atrioventricular block, first degree: Secondary | ICD-10-CM | POA: Diagnosis present

## 2022-03-16 DIAGNOSIS — D500 Iron deficiency anemia secondary to blood loss (chronic): Secondary | ICD-10-CM | POA: Insufficient documentation

## 2022-03-16 DIAGNOSIS — I252 Old myocardial infarction: Secondary | ICD-10-CM | POA: Diagnosis not present

## 2022-03-16 DIAGNOSIS — D539 Nutritional anemia, unspecified: Secondary | ICD-10-CM | POA: Diagnosis present

## 2022-03-16 DIAGNOSIS — S7010XA Contusion of unspecified thigh, initial encounter: Secondary | ICD-10-CM | POA: Diagnosis present

## 2022-03-16 DIAGNOSIS — I129 Hypertensive chronic kidney disease with stage 1 through stage 4 chronic kidney disease, or unspecified chronic kidney disease: Secondary | ICD-10-CM | POA: Diagnosis present

## 2022-03-16 DIAGNOSIS — R0902 Hypoxemia: Secondary | ICD-10-CM | POA: Diagnosis present

## 2022-03-16 DIAGNOSIS — R778 Other specified abnormalities of plasma proteins: Secondary | ICD-10-CM

## 2022-03-16 DIAGNOSIS — I219 Acute myocardial infarction, unspecified: Secondary | ICD-10-CM | POA: Diagnosis not present

## 2022-03-16 DIAGNOSIS — I452 Bifascicular block: Secondary | ICD-10-CM | POA: Diagnosis present

## 2022-03-16 DIAGNOSIS — G4733 Obstructive sleep apnea (adult) (pediatric): Secondary | ICD-10-CM

## 2022-03-16 DIAGNOSIS — Z8673 Personal history of transient ischemic attack (TIA), and cerebral infarction without residual deficits: Secondary | ICD-10-CM | POA: Diagnosis not present

## 2022-03-16 DIAGNOSIS — G4734 Idiopathic sleep related nonobstructive alveolar hypoventilation: Secondary | ICD-10-CM | POA: Diagnosis not present

## 2022-03-16 DIAGNOSIS — K259 Gastric ulcer, unspecified as acute or chronic, without hemorrhage or perforation: Secondary | ICD-10-CM | POA: Diagnosis not present

## 2022-03-16 DIAGNOSIS — S71101A Unspecified open wound, right thigh, initial encounter: Secondary | ICD-10-CM | POA: Diagnosis not present

## 2022-03-16 DIAGNOSIS — G2581 Restless legs syndrome: Secondary | ICD-10-CM | POA: Diagnosis present

## 2022-03-16 DIAGNOSIS — K922 Gastrointestinal hemorrhage, unspecified: Secondary | ICD-10-CM | POA: Diagnosis present

## 2022-03-16 DIAGNOSIS — S71101D Unspecified open wound, right thigh, subsequent encounter: Secondary | ICD-10-CM | POA: Diagnosis not present

## 2022-03-16 DIAGNOSIS — N429 Disorder of prostate, unspecified: Secondary | ICD-10-CM | POA: Diagnosis present

## 2022-03-16 DIAGNOSIS — K254 Chronic or unspecified gastric ulcer with hemorrhage: Secondary | ICD-10-CM | POA: Diagnosis not present

## 2022-03-16 DIAGNOSIS — I214 Non-ST elevation (NSTEMI) myocardial infarction: Secondary | ICD-10-CM | POA: Diagnosis not present

## 2022-03-16 LAB — BASIC METABOLIC PANEL
Anion gap: 6 (ref 5–15)
BUN: 60 mg/dL — ABNORMAL HIGH (ref 8–23)
CO2: 24 mmol/L (ref 22–32)
Calcium: 8.1 mg/dL — ABNORMAL LOW (ref 8.9–10.3)
Chloride: 109 mmol/L (ref 98–111)
Creatinine, Ser: 1.09 mg/dL (ref 0.61–1.24)
GFR, Estimated: >60 mL/min (ref >60)
Glucose, Bld: 132 mg/dL — ABNORMAL HIGH (ref 70–99)
Potassium: 4 mmol/L (ref 3.5–5.1)
Sodium: 139 mmol/L (ref 135–145)

## 2022-03-16 LAB — SARS CORONAVIRUS 2 BY RT PCR: SARS Coronavirus 2 by RT PCR: NEGATIVE

## 2022-03-16 LAB — URINALYSIS, ROUTINE W REFLEX MICROSCOPIC
Bilirubin Urine: NEGATIVE
Glucose, UA: NEGATIVE mg/dL
Hgb urine dipstick: NEGATIVE
Ketones, ur: NEGATIVE mg/dL
Leukocytes,Ua: NEGATIVE
Nitrite: NEGATIVE
Protein, ur: NEGATIVE mg/dL
Specific Gravity, Urine: 1.015 (ref 1.005–1.030)
pH: 5 (ref 5.0–8.0)

## 2022-03-16 LAB — ABO/RH: ABO/RH(D): A POS

## 2022-03-16 LAB — CBC
HCT: 17.5 % — ABNORMAL LOW (ref 39.0–52.0)
Hemoglobin: 5.5 g/dL — CL (ref 13.0–17.0)
MCH: 33.3 pg (ref 26.0–34.0)
MCHC: 31.4 g/dL (ref 30.0–36.0)
MCV: 106.1 fL — ABNORMAL HIGH (ref 80.0–100.0)
Platelets: 409 10*3/uL — ABNORMAL HIGH (ref 150–400)
RBC: 1.65 MIL/uL — ABNORMAL LOW (ref 4.22–5.81)
RDW: 14.5 % (ref 11.5–15.5)
WBC: 18 10*3/uL — ABNORMAL HIGH (ref 4.0–10.5)
nRBC: 0.3 % — ABNORMAL HIGH (ref 0.0–0.2)

## 2022-03-16 LAB — TROPONIN I (HIGH SENSITIVITY)
Troponin I (High Sensitivity): 994 ng/L (ref <18)
Troponin I (High Sensitivity): 1132 ng/L (ref <18)

## 2022-03-16 LAB — POC OCCULT BLOOD, ED: Fecal Occult Bld: POSITIVE — AB

## 2022-03-16 LAB — LACTIC ACID, PLASMA
Lactic Acid, Venous: 1.5 mmol/L (ref 0.5–1.9)
Lactic Acid, Venous: 1.9 mmol/L (ref 0.5–1.9)

## 2022-03-16 LAB — PREPARE RBC (CROSSMATCH)

## 2022-03-16 MED ORDER — SODIUM CHLORIDE 0.9 % IV SOLN
1.0000 g | Freq: Once | INTRAVENOUS | Status: DC
  Filled 2022-03-16: qty 10

## 2022-03-16 MED ORDER — PANTOPRAZOLE 80MG IVPB - SIMPLE MED
80.0000 mg | Freq: Once | INTRAVENOUS | Status: AC
  Administered 2022-03-17: 80 mg via INTRAVENOUS
  Filled 2022-03-16: qty 80

## 2022-03-16 MED ORDER — PANTOPRAZOLE INFUSION (NEW) - SIMPLE MED
8.0000 mg/h | INTRAVENOUS | Status: DC
  Administered 2022-03-17 – 2022-03-18 (×4): 8 mg/h via INTRAVENOUS
  Filled 2022-03-16 (×3): qty 80

## 2022-03-16 MED ORDER — SODIUM CHLORIDE 0.9 % IV SOLN
10.0000 mL/h | Freq: Once | INTRAVENOUS | Status: AC
  Administered 2022-03-17: 10 mL/h via INTRAVENOUS

## 2022-03-16 NOTE — ED Provider Notes (Signed)
[place] EMERGENCY DEPARTMENT Provider Note   CSN: [id number] Arrival date & time: 03/16/22  1928     History {Add pertinent medical, surgical, social history, OB history to HPI:1} Chief Complaint  Patient presents with   Chest Pain    Brandon Mason is a 88 y.o. male.   Chest Pain      Home Medications Prior to Admission medications   Medication Sig Start Date End Date Taking? Authorizing Provider  amLODipine (NORVASC) 10 MG tablet TAKE 1 TABLET BY MOUTH  DAILY 06/11/21   Saguier, Percell Miller, [name]  doxycycline (VIBRA-TABS) 100 MG tablet Take 1 tablet (100 mg total) by mouth 2 (two) times daily. Can give caps or generic 02/27/21   Saguier, Percell Miller, [name]  doxycycline (VIBRA-TABS) 100 MG tablet Take 1 tablet (100 mg total) by mouth 2 (two) times daily. 03/06/21   Saguier, Percell Miller, [name]  finasteride (PROSCAR) 5 MG tablet TAKE 1 TABLET BY MOUTH  DAILY 05/03/21   Saguier, Percell Miller, [name]  hydrochlorothiazide (MICROZIDE) 12.5 MG capsule 1 tab po today and repeat on Friday. 02/27/21   Saguier, Percell Miller, [name]  lisinopril (ZESTRIL) 20 MG tablet TAKE 1 TABLET BY MOUTH  DAILY 04/12/21   Saguier, Percell Miller, [name]  mupirocin ointment (BACTROBAN) 2 % APPLY  OINTMENT TOPICALLY TO AFFECTED AREA TWICE DAILY 12/09/19   Saguier, Percell Miller, [name]  nystatin cream (MYCOSTATIN) APPLY  CREAM TOPICALLY TO AFFECTED AREA TWICE DAILY 01/02/21   Saguier, Percell Miller, [name]  OXcarbazepine (TRILEPTAL) 150 MG tablet Take by mouth. 02/09/21   [provider]  pregabalin (LYRICA) 150 MG capsule Take 150 mg by mouth 2 (two) times daily. 11/09/19   [provider]  rOPINIRole (REQUIP) 4 MG tablet TAKE 1 TABLET BY MOUTH AT  BEDTIME 06/11/21   Saguier, Percell Miller, [name]  traMADol (ULTRAM) 50 MG tablet Take 1 tablet (50 mg total) by mouth every 8 (eight) hours as needed. for pain 03/07/21   Saguier, Percell Miller, [name]  traMADol (ULTRAM) 50 MG tablet TAKE 1 TABLET BY MOUTH EVERY 8 HOURS AS NEEDED FOR SEVERE PAIN 05/02/21    Saguier, Percell Miller, [name]      Allergies    Patient has no known allergies.    Review of Systems   Review of Systems  Cardiovascular:  Positive for chest pain.    Physical Exam Updated Vital Signs BP 122/62 (BP Location: Left Arm)   Pulse 91   Temp 99 F (37.2 C) (Oral)   Resp 18   SpO2 100%  Physical Exam  ED Results / Procedures / Treatments   Labs (all labs ordered are listed, but only abnormal results are displayed) Labs Reviewed  CBC - Abnormal; Notable for the following components:      Result Value   WBC 18.0 (*)    RBC 1.65 (*)    Hemoglobin 5.5 (*)    HCT 17.5 (*)    MCV 106.1 (*)    Platelets 409 (*)    nRBC 0.3 (*)    All other components within normal limits  URINALYSIS, ROUTINE W REFLEX MICROSCOPIC - Abnormal; Notable for the following components:   Color, Urine STRAW (*)    All other components within normal limits  CULTURE, BLOOD (ROUTINE X 2)  CULTURE, BLOOD (ROUTINE X 2)  LACTIC ACID, PLASMA  BASIC METABOLIC PANEL  LACTIC ACID, PLASMA  TROPONIN I (HIGH SENSITIVITY)    EKG EKG Interpretation  Date/Time:  Saturday March 16 2022 19:38:23 EDT Ventricular Rate:  96 PR Interval:  202 QRS  Duration: 136 QT Interval:  398 QTC Calculation: 502 R Axis:   -71 Text Interpretation: Normal sinus rhythm Right bundle branch block Left anterior fascicular block Abnormal ECG When compared with ECG of 04-Nov-2016 11:54, PREVIOUS ECG IS PRESENT Confirmed by Noemi Chapel [phone]) on 03/16/2022 9:40:43 PM  Radiology DG Chest 2 View  Result Date: 03/16/2022 CLINICAL DATA:  Chest pain EXAM: CHEST - 2 VIEW COMPARISON:  03/08/2022 FINDINGS: Right basilar atelectasis. No focal airspace consolidation. No pleural effusion or pneumothorax. Mild cardiomegaly is unchanged. IMPRESSION: Right basilar atelectasis. Electronically Signed   By: Ulyses Jarred M.D.   On: 03/16/2022 20:41    Procedures Procedures  {Document cardiac monitor, telemetry assessment procedure when  appropriate:1}  Medications Ordered in ED Medications - No data to display  ED Course/ Medical Decision Making/ A&P                           Medical Decision Making  ***  {Document critical care time when appropriate:1} {Document review of labs and clinical decision tools ie heart score, Chads2Vasc2 etc:1}  {Document your independent review of radiology images, and any outside records:1} {Document your discussion with family members, caretakers, and with consultants:1} {Document social determinants of health affecting pt's care:1} {Document your decision making why or why not admission, treatments were needed:1} Final Clinical Impression(s) / ED Diagnoses Final diagnoses:  None    Rx / DC Orders ED Discharge Orders     None

## 2022-03-16 NOTE — ED Provider Notes (Signed)
Pt with no prior history of gastrointestinal bleeding, does not take any anticoagulants and does not recall having a recent colonoscopy or endoscopy.  Presents with black stools for 3 days with some associated chest pain and associated shortness of breath.  On exam the patient is extremely pale, conjunctive a, mucous membranes, no tachycardia but has a borderline hypotension at 101/50.  Black stool present on exam, hemoglobin of just over 5, right lower extremity with edema and mild erythema around the vacuum seal site around the wound on the right medial thigh.  The patient is afebrile but will need to be admitted to the hospital for severe anemia in the case of what appears to be upper GI bleeding.  Will give Protonix, transfuse, admit to hospitalist, GI can be consulted tomorrow as the patient does appear hemodynamically stable.  He does have an elevated troponin consistent with an NSTEMI which I suspect is more related to the stress of the severe anemia.  Will discuss with cardiology as well  .Critical Care  Performed by: Noemi Chapel, MD Authorized by: Noemi Chapel, MD   Critical care provider statement:    Critical care time (minutes):  30   Critical care time was exclusive of:  Separately billable procedures and treating other patients and teaching time   Critical care was time spent personally by me on the following activities:  Development of treatment plan with patient or surrogate, discussions with consultants, evaluation of patient's response to treatment, examination of patient, ordering and review of laboratory studies, ordering and review of radiographic studies, ordering and performing treatments and interventions, pulse oximetry, re-evaluation of patient's condition, review of old charts and obtaining history from patient or surrogate   I assumed direction of critical care for this patient from another provider in my specialty: no       Noemi Chapel, MD 03/18/22 1100

## 2022-03-16 NOTE — ED Provider Triage Note (Cosign Needed Addendum)
Emergency Medicine Provider Triage Evaluation Note  Brandon Mason , a 88 y.o. male  was evaluated in triage.  Pt complains of chest pain, shortness of breath.  Reported this started this morning.  Describes the pain as a tightness understanding.  Patient has new oxygen demand.  Has an open wound to the medial aspect of the right knee. Also complaining of melena.   Review of Systems  Positive: As above Negative: Fevers, chills  Physical Exam  BP 122/62 (BP Location: Left Arm)   Pulse 91   Temp 99 F (37.2 C) (Oral)   Resp 18   SpO2 100%  Gen:   Awake, no distress   Resp:  Normal effort  MSK:   Moves extremities without difficulty  Other:  Wound to the medial aspect of the right knee with wound VAC.  There is swelling to the right lower extremity and mild erythema surrounding the wound VAC.  Medical Decision Making  Medically screening exam initiated at 8:17 PM.  Appropriate orders placed.  Brandon Mason was informed that the remainder of the evaluation will be completed by another provider, this initial triage assessment does not replace that evaluation, and the importance of remaining in the ED until their evaluation is complete.  Chest pain work-up   [name], [name] 03/16/22 2018    [name], [name] 03/16/22 2111

## 2022-03-16 NOTE — Assessment & Plan Note (Signed)
Continue with CPAP

## 2022-03-16 NOTE — Assessment & Plan Note (Signed)
Continue home ultram 50 mg q4h prn.

## 2022-03-16 NOTE — Assessment & Plan Note (Addendum)
-  Continue Lyrica, Requip and Tramadol prn

## 2022-03-16 NOTE — Assessment & Plan Note (Signed)
Continue with nocturnal O2.

## 2022-03-16 NOTE — ED Notes (Signed)
Patient placed on our oxygen tank. Wears 2L at home

## 2022-03-16 NOTE — Assessment & Plan Note (Addendum)
Recent I&D of right thigh abscess at [facility] records accessed. OR cultures grew MSSA. With elevated WBC, will start IV ancef. Continue with wound vac. Will consult wound care RN.

## 2022-03-16 NOTE — Assessment & Plan Note (Signed)
Verified with the patient's daughter and son that the patient is a DNR.  Family states he has a DNR form at home.

## 2022-03-16 NOTE — Assessment & Plan Note (Signed)
Patient is on amlodipine, hydrochlorothiazide and lisinopril as an outpatient, which were held secondary to acute anemia and concern for precipitating hypotension.

## 2022-03-16 NOTE — ED Notes (Addendum)
Charge RN notified that pt has Hgb 5.5.

## 2022-03-16 NOTE — H&P (Signed)
History and Physical    Brandon Mason:[id number] DOB: 19-May-1926 DOA: 03/16/2022  DOS: the patient was seen and examined on 03/16/2022  PCP: Clovia Cuff, MD   Patient coming from: Home  I have personally briefly reviewed patient's old medical records in [place]  CC: weakness, black stools for 4 days HPI: 88 year old male history of chronic pain syndrome, OSA, restless legs, hypertension, recent I&D of a right thigh hematoma at [facility] that grew out methicillin sensitive Staph aureus presents to the ER today with weakness and black stools for 4 days.  Patient has been taking meloxicam that is been prescribed by his pain clinic doctor for last couple months.  He has noticed melanotic stools for the last 4 days.  Has become increasingly tired.  Film is noticed increasing facial pallor.  He denies any chest pain.  He has chronic shortness of breath and wears nocturnal oxygen.  Patient was admitted to [facility] last week for a right thigh hematoma that turned out to be an abscess.  Had I&D need at [facility].  Family does not want to go back to [facility].  On arrival here, temp 99 heart rate 91 blood pressure 122/62 satting high percent on 2 L.  Labs showed a lactic acid of 1.9  COVID-negative  Hemoccult positive  BUN of 60, creatinine 1.09  White count 18, hemoglobin 5.5, platelets of 409  UA negative  Initial troponin I elevated at 994  Chest x-ray showed right basilar atelectasis  EKG my interpretation showed normal sinus rhythm with bifascicular block  Due to patient's anemia and elevated troponin, [facility] hospitalist contacted for admission.   ED Course: troponin 994, HgB 5.5  Review of Systems:  Review of Systems  Constitutional:  Positive for malaise/fatigue.  HENT: Negative.    Eyes: Negative.   Respiratory:  Positive for shortness of breath.   Cardiovascular: Negative.        Chest pressure  Gastrointestinal:   Positive for melena.  Musculoskeletal: Negative.   Skin: Negative.   Neurological:  Positive for weakness.       Chronic restless leg syndrome  Endo/Heme/Allergies: Negative.   Psychiatric/Behavioral: Negative.    All other systems reviewed and are negative.   Past Medical History:  Diagnosis Date   Abnormality of gait 02/09/2013   Arthritis    Cancer (HCC)    basal cell skin cancer   Cardiomegaly    Chronic kidney disease    BPH   Chronic leg pain    Hypersomnia    Hypertension    Hypoxia    pulmonary   Neuromuscular disorder (HCC)    restless leg syndrome    Nocturia    Prostate disorder    Restless legs syndrome (RLS) 02/09/2013   Restless legs syndrome with nocturnal myoclonus    RLS (restless legs syndrome)    x 40 years   Shortness of breath    with exertion    Sleep apnea    uses cpap   Stroke ([place])    minor stroke in 2003 - no lasting side effects   UARS (upper airway resistance syndrome) 06/28/2014    Past Surgical History:  Procedure Laterality Date   ANTERIOR CERVICAL DECOMP/DISCECTOMY FUSION N/A 03/29/2016   Procedure: ANTERIOR CERVICAL DECOMPRESSION/DISCECTOMY FUSION CERVICAL THREE- CERVICAL FOUR;  Surgeon: Ashok Pall, MD;  Location: [facility];  Service: Neurosurgery;  Laterality: N/A;   BACK SURGERY  2003   L4-5   CATARACT  EXTRACTION Bilateral    HERNIA REPAIR     inguinal hernia   TRANSURETHRAL PROSTATECTOMY WITH GYRUS INSTRUMENTS  04/28/2012   Procedure: TRANSURETHRAL PROSTATECTOMY WITH GYRUS INSTRUMENTS;  Surgeon: Fredricka Bonine, MD;  Location: [facility];  Service: Urology;  Laterality: N/A;   TRANSURETHRAL RESECTION OF PROSTATE  03/24/2012   Procedure: TRANSURETHRAL RESECTION OF THE PROSTATE (TURP);  Surgeon: Fredricka Bonine, MD;  Location: [facility];  Service: Urology;  Laterality: N/A;  with gyrus ,Greenlight PVP laser of Prostate     reports that he has never smoked. He has never used smokeless tobacco. He reports current  alcohol use of about 2.0 standard drinks of alcohol per week. He reports that he does not use drugs.  No Known Allergies  Family History  Problem Relation Age of Onset   Restless legs syndrome Mother    Restless legs syndrome Daughter    Esophageal cancer Neg Hx    Colon cancer Neg Hx    Pancreatic cancer Neg Hx    Stomach cancer Neg Hx     Prior to Admission medications   Medication Sig Start Date End Date Taking? Authorizing Provider  amLODipine (NORVASC) 10 MG tablet TAKE 1 TABLET BY MOUTH  DAILY 06/11/21   Saguier, Percell Miller, [name]  doxycycline (VIBRA-TABS) 100 MG tablet Take 1 tablet (100 mg total) by mouth 2 (two) times daily. Can give caps or generic 02/27/21   Saguier, Percell Miller, [name]  doxycycline (VIBRA-TABS) 100 MG tablet Take 1 tablet (100 mg total) by mouth 2 (two) times daily. 03/06/21   Saguier, Percell Miller, [name]  finasteride (PROSCAR) 5 MG tablet TAKE 1 TABLET BY MOUTH  DAILY 05/03/21   Saguier, Percell Miller, [name]  hydrochlorothiazide (MICROZIDE) 12.5 MG capsule 1 tab po today and repeat on Friday. 02/27/21   Saguier, Percell Miller, [name]  lisinopril (ZESTRIL) 20 MG tablet TAKE 1 TABLET BY MOUTH  DAILY 04/12/21   Saguier, Percell Miller, [name]  mupirocin ointment (BACTROBAN) 2 % APPLY  OINTMENT TOPICALLY TO AFFECTED AREA TWICE DAILY 12/09/19   Saguier, Percell Miller, [name]  nystatin cream (MYCOSTATIN) APPLY  CREAM TOPICALLY TO AFFECTED AREA TWICE DAILY 01/02/21   Saguier, Percell Miller, [name]  OXcarbazepine (TRILEPTAL) 150 MG tablet Take by mouth. 02/09/21   [provider]  pregabalin (LYRICA) 150 MG capsule Take 150 mg by mouth 2 (two) times daily. 11/09/19   [provider]  rOPINIRole (REQUIP) 4 MG tablet TAKE 1 TABLET BY MOUTH AT  BEDTIME 06/11/21   Saguier, Percell Miller, [name]  traMADol (ULTRAM) 50 MG tablet Take 1 tablet (50 mg total) by mouth every 8 (eight) hours as needed. for pain 03/07/21   Saguier, Percell Miller, [name]  traMADol (ULTRAM) 50 MG tablet TAKE 1 TABLET BY MOUTH EVERY 8 HOURS AS NEEDED FOR SEVERE PAIN 05/02/21    Mackie Pai, [name]    Physical Exam: Vitals:   03/16/22 2230 03/16/22 2245 03/16/22 2300 03/16/22 2310  BP: (!) 106/55 (!) 107/56 (!) 102/59   Pulse: 86 93 97 91  Resp: 15 15 (!) 22 14  Temp:      TempSrc:      SpO2: 95% 96% 97% 94%    Physical Exam Vitals and nursing note reviewed.  Constitutional:      General: He is not in acute distress.    Appearance: Normal appearance. He is ill-appearing. He is not toxic-appearing or diaphoretic.  HENT:     Head: Normocephalic.     Comments: +facial pallor Cardiovascular:     Rate and Rhythm: Normal rate and  regular rhythm.  Pulmonary:     Effort: Pulmonary effort is normal. No respiratory distress.     Breath sounds: No wheezing or rales.  Abdominal:     General: Bowel sounds are normal. There is no distension.     Tenderness: There is no abdominal tenderness. There is no guarding or rebound.  Musculoskeletal:     Comments: Right medial thigh wound vac. Mild erythema surrounding wound vac. See picture.  Skin:    General: Skin is warm and dry.     Capillary Refill: Capillary refill takes less than 2 seconds.  Neurological:     General: No focal deficit present.     Mental Status: He is alert and oriented to person, place, and time.       Labs on Admission: I have personally reviewed following labs and imaging studies  CBC: Recent Labs  Lab 03/16/22 2024  WBC 18.0*  HGB 5.5*  HCT 17.5*  MCV 106.1*  PLT 696*   Basic Metabolic Panel: Recent Labs  Lab 03/16/22 2024  NA 139  K 4.0  CL 109  CO2 24  GLUCOSE 132*  BUN 60*  CREATININE 1.09  CALCIUM 8.1*   GFR: CrCl cannot be calculated (Unknown ideal weight.). Liver Function Tests: No results for input(s): "AST", "ALT", "ALKPHOS", "BILITOT", "PROT", "ALBUMIN" in the last 168 hours. No results for input(s): "LIPASE", "AMYLASE" in the last 168 hours. No results for input(s): "AMMONIA" in the last 168 hours. Coagulation Profile: No results for input(s): "INR",  "PROTIME" in the last 168 hours. Cardiac Enzymes: Recent Labs  Lab 03/16/22 2024 03/16/22 2221  TROPONINIHS 994* 1,132*   BNP (last 3 results) No results for input(s): "PROBNP" in the last 8760 hours. HbA1C: No results for input(s): "HGBA1C" in the last 72 hours. CBG: No results for input(s): "GLUCAP" in the last 168 hours. Lipid Profile: No results for input(s): "CHOL", "HDL", "LDLCALC", "TRIG", "CHOLHDL", "LDLDIRECT" in the last 72 hours. Thyroid Function Tests: No results for input(s): "TSH", "T4TOTAL", "FREET4", "T3FREE", "THYROIDAB" in the last 72 hours. Anemia Panel: No results for input(s): "VITAMINB12", "FOLATE", "FERRITIN", "TIBC", "IRON", "RETICCTPCT" in the last 72 hours. Urine analysis:    Component Value Date/Time   COLORURINE STRAW (A) 03/16/2022 2030   APPEARANCEUR CLEAR 03/16/2022 2030   LABSPEC 1.015 03/16/2022 2030   [place] 5.0 03/16/2022 2030   GLUCOSEU NEGATIVE 03/16/2022 2030   GLUCOSEU NEGATIVE 07/23/2016 1528   HGBUR NEGATIVE 03/16/2022 2030   [place] NEGATIVE 03/16/2022 2030   BILIRUBINUR Negative 09/10/2018 [place] 03/16/2022 2030   PROTEINUR NEGATIVE 03/16/2022 2030   UROBILINOGEN negative (A) 09/10/2018 1043   UROBILINOGEN 0.2 07/23/2016 1528   NITRITE NEGATIVE 03/16/2022 2030   LEUKOCYTESUR NEGATIVE 03/16/2022 2030    Radiological Exams on Admission: I have personally reviewed images DG Chest 2 View  Result Date: 03/16/2022 CLINICAL DATA:  Chest pain EXAM: CHEST - 2 VIEW COMPARISON:  03/08/2022 FINDINGS: Right basilar atelectasis. No focal airspace consolidation. No pleural effusion or pneumothorax. Mild cardiomegaly is unchanged. IMPRESSION: Right basilar atelectasis. Electronically Signed   By: Ulyses Jarred M.D.   On: 03/16/2022 20:41    EKG: My personal interpretation of EKG shows: NSR, bifascicular block    Assessment/Plan Principal Problem:   GI bleeding Active Problems:   Symptomatic anemia   NSTEMI  (non-ST elevated myocardial infarction) (HCC)   Open wound of right thigh   Restless legs syndrome (RLS)   OSA on CPAP   Nocturnal hypoxemia   Essential  hypertension   Chronic pain syndrome   DNR (do not resuscitate)/DNI(Do Not Intubate)    Assessment and Plan: * GI bleeding Admit to inpatient med/tele. Start protonix gtts 80 mg bolus, then 8 mg/hr. [facility]([place]) notified by secure chat of consult to see the AM.  Open wound of right thigh Recent I&D of right thigh abscess at [facility] records accessed. OR cultures grew MSSA. With elevated WBC, will start IV ancef. Continue with wound vac. Will consult wound care RN.  NSTEMI (non-ST elevated myocardial infarction) ([place]) Likely demand ischemia from symptomatic anemia/GI bleeding. EDP discussed case with on-call cardiology(Ye) who recommended against starting IV heparin. Check lipid panel. Check echo. Will have to hold on ASA given GI bleeding.  Symptomatic anemia Due to GI bleeding. Will transfuse 2 units PRBC. Repeat CBC in AM. Pt denies he is a Jehovah's witness.  Chronic pain syndrome Continue home ultram 50 mg q4h prn.  Essential hypertension Hold home BP meds in face of NSTEMI and symptomatic anemia.  Nocturnal hypoxemia Continue with nocturnal O2.  OSA on CPAP Continue with CPAP.  Restless legs syndrome (RLS) On aggressive regimen for his restless legs including lyrica, ultram, requip. Discussed with dtr and son, that if pt's BP goes low with these drugs, they will have to be stopped until anemia corrected.  DNR (do not resuscitate)/DNI(Do Not Intubate) Verified with the patient's daughter and son that the patient is a DNR.  Family states he has a DNR form at home.   DVT prophylaxis: SCDs Code Status: DNR/DNI(Do NOT Intubate) verified with pt's dtr teresja, son Elta [place] Family Communication: discussed with pt, dtr teresja, son mark  Disposition Plan: return home  Consults called: Pax  GI consulted via secure chat  Admission status: Inpatient, Telemetry bed   Kristopher Oppenheim, DO [facility] 03/16/2022, 11:55 PM

## 2022-03-16 NOTE — Assessment & Plan Note (Signed)
Patient with a history of melena. Associated severe anemia. GI consulted. Protonix drip initiated. -GI recommendations: EGD today -Continue Protonix drip

## 2022-03-16 NOTE — Subjective & Objective (Signed)
CC: weakness, black stools for 4 days HPI: 88 year old male history of chronic pain syndrome, OSA, restless legs, hypertension, recent I&D of a right thigh hematoma at [facility] that grew out methicillin sensitive Staph aureus presents to the ER today with weakness and black stools for 4 days.  Patient has been taking meloxicam that is been prescribed by his pain clinic doctor for last couple months.  He has noticed melanotic stools for the last 4 days.  Has become increasingly tired.  Film is noticed increasing facial pallor.  He denies any chest pain.  He has chronic shortness of breath and wears nocturnal oxygen.  Patient was admitted to [facility] last week for a right thigh hematoma that turned out to be an abscess.  Had I&D need at [facility].  Family does not want to go back to [facility].  On arrival here, temp 99 heart rate 91 blood pressure 122/62 satting high percent on 2 L.  Labs showed a lactic acid of 1.9  COVID-negative  Hemoccult positive  BUN of 60, creatinine 1.09  White count 18, hemoglobin 5.5, platelets of 409  UA negative  Initial troponin I elevated at 994  Chest x-ray showed right basilar atelectasis  EKG my interpretation showed normal sinus rhythm with bifascicular block  Due to patient's anemia and elevated troponin, [facility] hospitalist contacted for admission.

## 2022-03-16 NOTE — Assessment & Plan Note (Addendum)
Troponin of 994 > 1,132. Associated chest pressure and in setting of acute severe anemia. No ischemic changes on EKG. Cardiology consulted on admission and recommended no heparin IV secondary to GI bleeding and severe anemia. Transthoracic Echocardiogram ordered and pending -Cardiology recommendations: pending today -Follow-up Transthoracic Echocardiogram -Trend troponin until peak

## 2022-03-16 NOTE — Assessment & Plan Note (Addendum)
Recent hemoglobin of 10.7 from 9/11. Acute anemia secondary to melena and presumed GI bleeding. Hemoglobin of 5.5 on admission. Two units of PRBC ordered and transfused. -Post-transfusion H&H

## 2022-03-16 NOTE — ED Triage Notes (Signed)
Pt reported to ED with c/o central chest pain that has been ongoing throughout the day. Pt states he has also been wearing oxygen today d/t feeling short of breath, has it at home to wear "as needed".

## 2022-03-17 ENCOUNTER — Inpatient Hospital Stay (HOSPITAL_COMMUNITY): Payer: Medicare HMO | Admitting: Certified Registered Nurse Anesthetist

## 2022-03-17 ENCOUNTER — Encounter (HOSPITAL_COMMUNITY)
Admission: EM | Disposition: A | Discharge status: Home Health | Payer: Self-pay | Source: Home / Self Care | Attending: Family Medicine

## 2022-03-17 ENCOUNTER — Encounter (HOSPITAL_COMMUNITY): Payer: Self-pay | Admitting: Internal Medicine

## 2022-03-17 DIAGNOSIS — K209 Esophagitis, unspecified without bleeding: Secondary | ICD-10-CM

## 2022-03-17 DIAGNOSIS — K922 Gastrointestinal hemorrhage, unspecified: Secondary | ICD-10-CM | POA: Diagnosis not present

## 2022-03-17 DIAGNOSIS — I252 Old myocardial infarction: Secondary | ICD-10-CM

## 2022-03-17 DIAGNOSIS — I10 Essential (primary) hypertension: Secondary | ICD-10-CM | POA: Diagnosis not present

## 2022-03-17 DIAGNOSIS — K2210 Ulcer of esophagus without bleeding: Secondary | ICD-10-CM

## 2022-03-17 DIAGNOSIS — D649 Anemia, unspecified: Secondary | ICD-10-CM | POA: Diagnosis not present

## 2022-03-17 DIAGNOSIS — K259 Gastric ulcer, unspecified as acute or chronic, without hemorrhage or perforation: Secondary | ICD-10-CM

## 2022-03-17 DIAGNOSIS — I219 Acute myocardial infarction, unspecified: Secondary | ICD-10-CM

## 2022-03-17 DIAGNOSIS — G4733 Obstructive sleep apnea (adult) (pediatric): Secondary | ICD-10-CM

## 2022-03-17 DIAGNOSIS — I5032 Chronic diastolic (congestive) heart failure: Secondary | ICD-10-CM

## 2022-03-17 DIAGNOSIS — S71101D Unspecified open wound, right thigh, subsequent encounter: Secondary | ICD-10-CM

## 2022-03-17 DIAGNOSIS — G894 Chronic pain syndrome: Secondary | ICD-10-CM | POA: Diagnosis not present

## 2022-03-17 DIAGNOSIS — K2970 Gastritis, unspecified, without bleeding: Secondary | ICD-10-CM

## 2022-03-17 DIAGNOSIS — Z9989 Dependence on other enabling machines and devices: Secondary | ICD-10-CM

## 2022-03-17 HISTORY — PX: ESOPHAGOGASTRODUODENOSCOPY (EGD) WITH PROPOFOL: SHX5813

## 2022-03-17 LAB — COMPREHENSIVE METABOLIC PANEL
ALT: 17 U/L (ref 0–44)
AST: 33 U/L (ref 15–41)
Albumin: 2.5 g/dL — ABNORMAL LOW (ref 3.5–5.0)
Alkaline Phosphatase: 44 U/L (ref 38–126)
Anion gap: 6 (ref 5–15)
BUN: 46 mg/dL — ABNORMAL HIGH (ref 8–23)
CO2: 25 mmol/L (ref 22–32)
Calcium: 8.2 mg/dL — ABNORMAL LOW (ref 8.9–10.3)
Chloride: 109 mmol/L (ref 98–111)
Creatinine, Ser: 0.97 mg/dL (ref 0.61–1.24)
GFR, Estimated: >60 mL/min (ref >60)
Glucose, Bld: 122 mg/dL — ABNORMAL HIGH (ref 70–99)
Potassium: 3.9 mmol/L (ref 3.5–5.1)
Sodium: 140 mmol/L (ref 135–145)
Total Bilirubin: 0.3 mg/dL (ref 0.3–1.2)
Total Protein: 5.7 g/dL — ABNORMAL LOW (ref 6.5–8.1)

## 2022-03-17 LAB — CBC WITH DIFFERENTIAL/PLATELET
Abs Immature Granulocytes: 0 10*3/uL (ref 0.00–0.07)
Basophils Absolute: 0.4 10*3/uL — ABNORMAL HIGH (ref 0.0–0.1)
Basophils Relative: 3 %
Eosinophils Absolute: 0.1 10*3/uL (ref 0.0–0.5)
Eosinophils Relative: 1 %
HCT: 22.1 % — ABNORMAL LOW (ref 39.0–52.0)
Hemoglobin: 7.7 g/dL — ABNORMAL LOW (ref 13.0–17.0)
Lymphocytes Relative: 8 %
Lymphs Abs: 1.1 10*3/uL (ref 0.7–4.0)
MCH: 32.8 pg (ref 26.0–34.0)
MCHC: 34.8 g/dL (ref 30.0–36.0)
MCV: 94 fL (ref 80.0–100.0)
Monocytes Absolute: 0.5 10*3/uL (ref 0.1–1.0)
Monocytes Relative: 4 %
Neutro Abs: 11.5 10*3/uL — ABNORMAL HIGH (ref 1.7–7.7)
Neutrophils Relative %: 84 %
Platelets: 361 10*3/uL (ref 150–400)
RBC: 2.35 MIL/uL — ABNORMAL LOW (ref 4.22–5.81)
RDW: 19.5 % — ABNORMAL HIGH (ref 11.5–15.5)
WBC: 13.7 10*3/uL — ABNORMAL HIGH (ref 4.0–10.5)
nRBC: 0 /100 WBC
nRBC: 0.2 % (ref 0.0–0.2)

## 2022-03-17 LAB — LIPID PANEL
Cholesterol: 90 mg/dL (ref 0–200)
HDL: 30 mg/dL — ABNORMAL LOW (ref >40)
LDL Cholesterol: 42 mg/dL (ref 0–99)
Total CHOL/HDL Ratio: 3 RATIO
Triglycerides: 88 mg/dL (ref <150)
VLDL: 18 mg/dL (ref 0–40)

## 2022-03-17 LAB — MAGNESIUM: Magnesium: 2.1 mg/dL (ref 1.7–2.4)

## 2022-03-17 LAB — TROPONIN I (HIGH SENSITIVITY): Troponin I (High Sensitivity): 2163 ng/L (ref <18)

## 2022-03-17 SURGERY — ESOPHAGOGASTRODUODENOSCOPY (EGD) WITH PROPOFOL
Anesthesia: Monitor Anesthesia Care

## 2022-03-17 MED ORDER — CEFAZOLIN SODIUM-DEXTROSE 1-4 GM/50ML-% IV SOLN
1.0000 g | Freq: Three times a day (TID) | INTRAVENOUS | Status: DC
  Administered 2022-03-17 – 2022-03-20 (×10): 1 g via INTRAVENOUS
  Filled 2022-03-17 (×11): qty 50

## 2022-03-17 MED ORDER — FINASTERIDE 5 MG PO TABS
5.0000 mg | ORAL_TABLET | Freq: Every day | ORAL | Status: DC
  Administered 2022-03-17 – 2022-03-20 (×4): 5 mg via ORAL
  Filled 2022-03-17 (×5): qty 1

## 2022-03-17 MED ORDER — ONDANSETRON HCL 4 MG/2ML IJ SOLN: 4.0000 mg | Freq: Four times a day (QID) | INTRAMUSCULAR | Status: DC | PRN

## 2022-03-17 MED ORDER — SODIUM CHLORIDE 0.9 % IV SOLN: INTRAVENOUS | Status: DC

## 2022-03-17 MED ORDER — PREGABALIN 75 MG PO CAPS
150.0000 mg | ORAL_CAPSULE | Freq: Two times a day (BID) | ORAL | Status: DC
  Administered 2022-03-17 (×2): 150 mg via ORAL
  Filled 2022-03-17 (×3): qty 2

## 2022-03-17 MED ORDER — ROPINIROLE HCL 0.5 MG PO TABS
2.0000 mg | ORAL_TABLET | Freq: Two times a day (BID) | ORAL | Status: DC
  Administered 2022-03-17 – 2022-03-20 (×7): 2 mg via ORAL
  Filled 2022-03-17 (×7): qty 4

## 2022-03-17 MED ORDER — ACETAMINOPHEN 650 MG RE SUPP: 650.0000 mg | Freq: Four times a day (QID) | RECTAL | Status: DC | PRN

## 2022-03-17 MED ORDER — SODIUM CHLORIDE 0.9 % IV SOLN
2.0000 g | Freq: Once | INTRAVENOUS | Status: AC
  Administered 2022-03-17: 2 g via INTRAVENOUS
  Filled 2022-03-17: qty 20

## 2022-03-17 MED ORDER — LIDOCAINE 2% (20 MG/ML) 5 ML SYRINGE
INTRAMUSCULAR | Status: DC | PRN
  Administered 2022-03-17: 40 mg via INTRAVENOUS

## 2022-03-17 MED ORDER — PROPOFOL 500 MG/50ML IV EMUL
INTRAVENOUS | Status: DC | PRN
  Administered 2022-03-17: 75 ug/kg/min via INTRAVENOUS

## 2022-03-17 MED ORDER — CYCLOBENZAPRINE HCL 5 MG PO TABS
2.5000 mg | ORAL_TABLET | Freq: Once | ORAL | Status: AC
  Administered 2022-03-17: 2.5 mg via ORAL
  Filled 2022-03-17: qty 1

## 2022-03-17 MED ORDER — ACETAMINOPHEN 325 MG PO TABS: 650.0000 mg | ORAL_TABLET | Freq: Four times a day (QID) | ORAL | Status: DC | PRN

## 2022-03-17 MED ORDER — LACTATED RINGERS IV SOLN: INTRAVENOUS | Status: DC | PRN

## 2022-03-17 MED ORDER — PROPOFOL 10 MG/ML IV BOLUS
INTRAVENOUS | Status: DC | PRN
  Administered 2022-03-17: 20 mg via INTRAVENOUS

## 2022-03-17 MED ORDER — TRAMADOL HCL 50 MG PO TABS
50.0000 mg | ORAL_TABLET | Freq: Three times a day (TID) | ORAL | Status: DC | PRN
  Administered 2022-03-17 – 2022-03-18 (×2): 50 mg via ORAL
  Filled 2022-03-17 (×3): qty 1

## 2022-03-17 MED ORDER — ONDANSETRON HCL 4 MG PO TABS: 4.0000 mg | ORAL_TABLET | Freq: Four times a day (QID) | ORAL | Status: DC | PRN

## 2022-03-17 SURGICAL SUPPLY — 15 items

## 2022-03-17 NOTE — Consult Note (Signed)
Consultation  Referring Provider:  Dr. Lonny Prude    Primary Care Physician:  Clovia Cuff, MD Primary Gastroenterologist: Dr. Loletha Carrow        Reason for Consultation:  GI Bleed            HPI:   Brandon Mason is a 88 y.o. male with a past medical history of chronic pain, OSA, restless legs, hypertension and recent I&D of the right thigh hematoma at [facility] regional that grew out methicillin-resistant sensitive Staph aureus who presented to the ER on 03/16/2022 with black stools for 4 days.    Recent admission at [facility] regional last week for a right thigh hematoma that turned out to be an abscess, had I&D.    Today, patient explains that he started seeing some black stools maybe 2 times a day about 3 to 4 days ago.  His last was yesterday evening.  Explains that he has been on Meloxicam for the past couple of months for some pain and has been growing increasingly short of breath and fatigued.  Denies having any previous GI bleeding.  This is symptoms include a small amount of epigastric discomfort and heartburn/reflux over the past 3 to 4 days which is new for him.    Denies fever, chills, vomiting or symptoms that awaken him from sleep.     ER course: Temp 99, heart rate 91, blood pressure 122/62, Hemoccult positive, BUN 60, creatinine 1.09, chest x-ray with right atelectasis  GI history: 08/16/2016 office visit with Dr. Loletha Carrow for flatulence at that time thought not pathological, there was no further testing planned, thought related to diet  Past Medical History:  Diagnosis Date   Abnormality of gait 02/09/2013   Arthritis    Cancer ([place])    basal cell skin cancer   Cardiomegaly    Chronic kidney disease    BPH   Chronic leg pain    Hypersomnia    Hypertension    Hypoxia    pulmonary   Neuromuscular disorder (HCC)    restless leg syndrome    Nocturia    Prostate disorder    Restless legs syndrome (RLS) 02/09/2013   Restless legs syndrome with nocturnal myoclonus    RLS  (restless legs syndrome)    x 40 years   Shortness of breath    with exertion    Sleep apnea    uses cpap   Stroke ([place])    minor stroke in 2003 - no lasting side effects   UARS (upper airway resistance syndrome) 06/28/2014    Past Surgical History:  Procedure Laterality Date   ANTERIOR CERVICAL DECOMP/DISCECTOMY FUSION N/A 03/29/2016   Procedure: ANTERIOR CERVICAL DECOMPRESSION/DISCECTOMY FUSION CERVICAL THREE- CERVICAL FOUR;  Surgeon: Ashok Pall, MD;  Location: [place];  Service: Neurosurgery;  Laterality: N/A;   BACK SURGERY  2003   L4-5   CATARACT EXTRACTION Bilateral    HERNIA REPAIR     inguinal hernia   TRANSURETHRAL PROSTATECTOMY WITH GYRUS INSTRUMENTS  04/28/2012   Procedure: TRANSURETHRAL PROSTATECTOMY WITH GYRUS INSTRUMENTS;  Surgeon: Fredricka Bonine, MD;  Location: [facility];  Service: Urology;  Laterality: N/A;   TRANSURETHRAL RESECTION OF PROSTATE  03/24/2012   Procedure: TRANSURETHRAL RESECTION OF THE PROSTATE (TURP);  Surgeon: Fredricka Bonine, MD;  Location: [facility];  Service: Urology;  Laterality: N/A;  with gyrus ,Greenlight PVP laser of Prostate    Family History  Problem Relation Age of Onset   Restless legs syndrome Mother  Restless legs syndrome Daughter    Esophageal cancer Neg Hx    Colon cancer Neg Hx    Pancreatic cancer Neg Hx    Stomach cancer Neg Hx     Social History   Tobacco Use   Smoking status: Never   Smokeless tobacco: Never  Substance Use Topics   Alcohol use: Yes    Alcohol/week: 2.0 standard drinks of alcohol    Types: 2 Cans of beer per week    Comment: weekly   Drug use: No    Prior to Admission medications   Medication Sig Start Date End Date Taking? Authorizing Provider  doxycycline (VIBRA-TABS) 100 MG tablet Take 1 tablet (100 mg total) by mouth 2 (two) times daily. 03/06/21  Yes Saguier, Percell Miller, [name]  finasteride (PROSCAR) 5 MG tablet TAKE 1 TABLET BY MOUTH  DAILY Patient taking differently: Take 5 mg  by mouth daily. 05/03/21  Yes Saguier, Percell Miller, [name]  lisinopril (ZESTRIL) 20 MG tablet TAKE 1 TABLET BY MOUTH  DAILY Patient taking differently: Take 20 mg by mouth every evening. 04/12/21  Yes Saguier, Percell Miller, [name]  meloxicam (MOBIC) 15 MG tablet Take 15 mg by mouth at bedtime. 02/05/22  Yes [provider]  OVER THE COUNTER MEDICATION Take 1 tablet by mouth 2 (two) times daily. MetaNx vitamin/supplement   Yes [provider]  pregabalin (LYRICA) 150 MG capsule Take 150 mg by mouth 2 (two) times daily. 11/09/19  Yes [provider]  rOPINIRole (REQUIP) 4 MG tablet TAKE 1 TABLET BY MOUTH AT  BEDTIME Patient taking differently: Take 2 mg by mouth See admin instructions. Take 1/2 tablet at noon and 1/2 tablet at at night (10pm) 06/11/21  Yes Saguier, Percell Miller, [name]  traMADol (ULTRAM) 50 MG tablet TAKE 1 TABLET BY MOUTH EVERY 8 HOURS AS NEEDED FOR SEVERE PAIN Patient taking differently: Take 50 mg by mouth See admin instructions. Take 1 tablet by mouth every 4 to 6 hours for severe pain. May take 1 additional tablet at night if needed 0700, 1200, 1700, 2200 05/02/21  Yes Saguier, Percell Miller, [name]  amLODipine (NORVASC) 10 MG tablet TAKE 1 TABLET BY MOUTH  DAILY Patient not taking: Reported on 03/17/2022 06/11/21   Saguier, Percell Miller, [name]  hydrochlorothiazide (MICROZIDE) 12.5 MG capsule 1 tab po today and repeat on Friday. Patient not taking: Reported on 03/17/2022 02/27/21   Saguier, Percell Miller, [name]  mupirocin ointment (BACTROBAN) 2 % APPLY  OINTMENT TOPICALLY TO AFFECTED AREA TWICE DAILY 12/09/19   Saguier, Percell Miller, [name]  nystatin cream (MYCOSTATIN) APPLY  CREAM TOPICALLY TO AFFECTED AREA TWICE DAILY 01/02/21   Saguier, Percell Miller, [name]  OXcarbazepine (TRILEPTAL) 150 MG tablet Take by mouth. Patient not taking: Reported on 03/17/2022 02/09/21   [provider]  traMADol (ULTRAM) 50 MG tablet Take 1 tablet (50 mg total) by mouth every 8 (eight) hours as needed. for pain 03/07/21   Saguier,  Percell Miller, [name]    Current Facility-Administered Medications  Medication Dose Route Frequency Provider Last Rate Last Admin   cefTRIAXone (ROCEPHIN) 2 g in sodium chloride 0.9 % 100 mL IVPB  2 g Intravenous Once Kristopher Oppenheim, DO 200 mL/hr at 03/17/22 0835 2 g at 03/17/22 0835   pantoprozole (PROTONIX) 80 mg /NS 100 mL infusion  8 mg/hr Intravenous Continuous Kristopher Oppenheim, DO 10 mL/hr at 03/17/22 0033 8 mg/hr at 03/17/22 0033    Allergies as of 03/16/2022   (No Known Allergies)     Review of Systems:    Constitutional: No weight loss,  fever or chills Skin: No rash  Cardiovascular: No chest pain  Respiratory: No SOB Gastrointestinal: See HPI and otherwise negative Genitourinary: No dysuria  Neurological: No headache, dizziness or syncope Musculoskeletal: No new muscle or joint pain Hematologic: No bruising Psychiatric: No history of depression or anxiety    Physical Exam:  Vital signs in last 24 hours: Temp:  [97.5 F (36.4 C)-99 F (37.2 C)] 97.7 F (36.5 C) (09/17 0810) Pulse Rate:  [70-106] 78 (09/17 0810) Resp:  [14-22] 15 (09/17 0810) BP: (100-123)/(51-62) 123/53 (09/17 0810) SpO2:  [93 %-100 %] 95 % (09/17 0810)   General:   Pleasant Elderly Caucasian male appears to be in NAD, Well developed, Well nourished, alert and cooperative Head:  Normocephalic and atraumatic. Eyes:   PEERL, EOMI. No icterus. Conjunctiva pink. Ears:  Normal auditory acuity. Neck:  Supple Throat: Oral cavity and pharynx without inflammation, swelling or lesion. Lungs: Respirations even and unlabored. Lungs clear to auscultation bilaterally.   No wheezes, crackles, or rhonchi.  Heart: Normal S1, S2. No MRG. Regular rate and rhythm. No peripheral edema, cyanosis or pallor.  Abdomen:  Soft, nondistended, mild epigastric ttp. No rebound or guarding. Normal bowel sounds. No appreciable masses or hepatomegaly. Rectal:  Not performed.  Msk:  Symmetrical without gross deformities. Peripheral pulses intact.   Extremities:  Without edema, no deformity or joint abnormality.  Neurologic:  Alert and  oriented x4;  grossly normal neurologically. Skin:   Dry and intact. +erythema of both legs b/l, wound vac right thigh Psychiatric: Demonstrates good judgement and reason without abnormal affect or behaviors.   LAB RESULTS:    Latest Ref Rng & Units 03/16/2022    8:24 PM 07/10/2020   11:18 AM 03/10/2020   12:10 PM  CBC  WBC 4.0 - 10.5 K/uL 18.0  5.1  6.3   Hemoglobin 13.0 - 17.0 g/dL 5.5  13.4  13.5   Hematocrit 39.0 - 52.0 % 17.5  40.5  40.9   Platelets 150 - 400 K/uL 409  241.0  231.0     BMET Recent Labs    03/16/22 2024  NA 139  K 4.0  CL 109  CO2 24  GLUCOSE 132*  BUN 60*  CREATININE 1.09  CALCIUM 8.1*    STUDIES: DG Chest 2 View  Result Date: 03/16/2022 CLINICAL DATA:  Chest pain EXAM: CHEST - 2 VIEW COMPARISON:  03/08/2022 FINDINGS: Right basilar atelectasis. No focal airspace consolidation. No pleural effusion or pneumothorax. Mild cardiomegaly is unchanged. IMPRESSION: Right basilar atelectasis. Electronically Signed   By: Ulyses Jarred M.D.   On: 03/16/2022 20:41      Impression / Plan:   Impression: 1.  Melena with acute blood loss anemia: Hemoglobin 13.4 in January 2022, down to 5.5 at time of admission yesterday evening--> 2 units PRBCs--> recheck pending; consider most likely upper GI bleed 2.  Open wound of right thigh: Recent I&D, elevated WBC, started on IV Ancef and continued on wound VAC 3.  NSTEMI: Thought likely from demand ischemia from symptomatic anemia/GI bleeding, aspirin on hold 4.  OSA on CPAP 5.  Nocturnal hypoxemia: Continue with nocturnal oxygen 6.  Restless leg syndrome  Plan: We will plan for EGD today with Dr. [name].  Did discuss risks,  benefits, limitations and alternatives and patient agrees to proceed. Patient remain n.p.o. for now Continue to monitor hemoglobin with transfusion as needed less than 7 Ordered stat hemoglobin now to make sure  hemoglobin is above 7 now after 2 units of  PRBCs overnight Continue Pantoprazole infusion for now Further recommendations after time of EGD this afternoon currently scheduled around 2:30  Thank you for your kind consultation, we will continue to follow.  Lavone Nian [facility]  03/17/2022, 8:38 AM

## 2022-03-17 NOTE — Consult Note (Signed)
Pharmacy Antibiotic Note  Brandon Mason is a 89 y.o. male admitted on 03/16/2022 with cellulitis.  Pharmacy has been consulted for Cefazolin dosing.  Plan: At OSH patient recently received incision and debridement of infected hematoma. Patient was discharged on doxycycline to end on 9/23. Patient presented here with leukocytosis (WBC 18K) on admission but was afebrile and no tachypnea/tachycardia. No obvious evidence of infection. Will start Cefazolin 1g IV q 8 hrs x 5 days. Monitor for signs and sx of therapy deescalation.   Height: '5\' 8"'$  (172.7 cm) Weight: 80.4 kg (177 lb 3.2 oz) IBW/kg (Calculated) : 68.4  Temp (24hrs), Avg:97.9 F (36.6 C), Min:97.5 F (36.4 C), Max:99 F (37.2 C)  Recent Labs  Lab 03/16/22 2024 03/16/22 2221  WBC 18.0*  --   CREATININE 1.09  --   LATICACIDVEN 1.5 1.9    Estimated Creatinine Clearance: 41 mL/min (by C-G formula based on SCr of 1.09 mg/dL).    No Known Allergies  Antimicrobials this admission: 9/17 Ceftriaxone 2g IV x 1 dose 9/17 Cefazolin 1g IV q 8 hrs >>   Dose adjustments this admission: N/A  Microbiology results: 9/16 BCx: NG < 12 hrs   Thank you for allowing pharmacy to be a part of this patient's care.  [place] Intern

## 2022-03-17 NOTE — Hospital Course (Signed)
Brandon Mason is a 88 y.o. male with a history of OSA, chronic pain, restless legs, hypertension, recent I&D s/p wound vac. Patient presented secondary to report of black stools and found to have severe anemia (hemoglobin of 5.5)  with associated elevated troponin. GI and cardiology consulted. Protonix drip started and 2 units of PRBC transfused. Upper endoscopy performed on 9/17 and was significant for esophageal ulcers without stigmata of recent bleeding, in addition to gastritis. PPI recommended.

## 2022-03-17 NOTE — H&P (View-Only) (Signed)
Consultation  Referring Provider:  Dr. Lonny Prude    Primary Care Physician:  Clovia Cuff, MD Primary Gastroenterologist: Dr. Loletha Carrow        Reason for Consultation:  GI Bleed            HPI:   Brandon Mason is a 88 y.o. male with a past medical history of chronic pain, OSA, restless legs, hypertension and recent I&D of the right thigh hematoma at [facility] that grew out methicillin-resistant sensitive Staph aureus who presented to the ER on 03/16/2022 with black stools for 4 days.    Recent admission at [facility] last week for a right thigh hematoma that turned out to be an abscess, had I&D.    Today, patient explains that he started seeing some black stools maybe 2 times a day about 3 to 4 days ago.  His last was yesterday evening.  Explains that he has been on Meloxicam for the past couple of months for some pain and has been growing increasingly short of breath and fatigued.  Denies having any previous GI bleeding.  This is symptoms include a small amount of epigastric discomfort and heartburn/reflux over the past 3 to 4 days which is new for him.    Denies fever, chills, vomiting or symptoms that awaken him from sleep.     ER course: Temp 99, heart rate 91, blood pressure 122/62, Hemoccult positive, BUN 60, creatinine 1.09, chest x-ray with right atelectasis  GI history: 08/16/2016 office visit with Dr. Loletha Carrow for flatulence at that time thought not pathological, there was no further testing planned, thought related to diet  Past Medical History:  Diagnosis Date   Abnormality of gait 02/09/2013   Arthritis    Cancer ([place])    basal cell skin cancer   Cardiomegaly    Chronic kidney disease    BPH   Chronic leg pain    Hypersomnia    Hypertension    Hypoxia    pulmonary   Neuromuscular disorder (HCC)    restless leg syndrome    Nocturia    Prostate disorder    Restless legs syndrome (RLS) 02/09/2013   Restless legs syndrome with nocturnal myoclonus    RLS  (restless legs syndrome)    x 40 years   Shortness of breath    with exertion    Sleep apnea    uses cpap   Stroke ([place])    minor stroke in 2003 - no lasting side effects   UARS (upper airway resistance syndrome) 06/28/2014    Past Surgical History:  Procedure Laterality Date   ANTERIOR CERVICAL DECOMP/DISCECTOMY FUSION N/A 03/29/2016   Procedure: ANTERIOR CERVICAL DECOMPRESSION/DISCECTOMY FUSION CERVICAL THREE- CERVICAL FOUR;  Surgeon: Ashok Pall, MD;  Location: [place] [facility];  Service: Neurosurgery;  Laterality: N/A;   BACK SURGERY  2003   L4-5   CATARACT EXTRACTION Bilateral    HERNIA REPAIR     inguinal hernia   TRANSURETHRAL PROSTATECTOMY WITH GYRUS INSTRUMENTS  04/28/2012   Procedure: TRANSURETHRAL PROSTATECTOMY WITH GYRUS INSTRUMENTS;  Surgeon: Fredricka Bonine, MD;  Location: [facility];  Service: Urology;  Laterality: N/A;   TRANSURETHRAL RESECTION OF PROSTATE  03/24/2012   Procedure: TRANSURETHRAL RESECTION OF THE PROSTATE (TURP);  Surgeon: Fredricka Bonine, MD;  Location: [facility];  Service: Urology;  Laterality: N/A;  with gyrus ,Greenlight PVP laser of Prostate    Family History  Problem Relation Age of Onset   Restless legs syndrome Mother  Restless legs syndrome Daughter    Esophageal cancer Neg Hx    Colon cancer Neg Hx    Pancreatic cancer Neg Hx    Stomach cancer Neg Hx     Social History   Tobacco Use   Smoking status: Never   Smokeless tobacco: Never  Substance Use Topics   Alcohol use: Yes    Alcohol/week: 2.0 standard drinks of alcohol    Types: 2 Cans of beer per week    Comment: weekly   Drug use: No    Prior to Admission medications   Medication Sig Start Date End Date Taking? Authorizing Provider  doxycycline (VIBRA-TABS) 100 MG tablet Take 1 tablet (100 mg total) by mouth 2 (two) times daily. 03/06/21  Yes Saguier, Percell Miller, [name]  finasteride (PROSCAR) 5 MG tablet TAKE 1 TABLET BY MOUTH  DAILY Patient taking differently: Take 5 mg  by mouth daily. 05/03/21  Yes Saguier, Percell Miller, [name]  lisinopril (ZESTRIL) 20 MG tablet TAKE 1 TABLET BY MOUTH  DAILY Patient taking differently: Take 20 mg by mouth every evening. 04/12/21  Yes Saguier, Percell Miller, [name]  meloxicam (MOBIC) 15 MG tablet Take 15 mg by mouth at bedtime. 02/05/22  Yes [provider]  OVER THE COUNTER MEDICATION Take 1 tablet by mouth 2 (two) times daily. MetaNx vitamin/supplement   Yes [provider]  pregabalin (LYRICA) 150 MG capsule Take 150 mg by mouth 2 (two) times daily. 11/09/19  Yes [provider]  rOPINIRole (REQUIP) 4 MG tablet TAKE 1 TABLET BY MOUTH AT  BEDTIME Patient taking differently: Take 2 mg by mouth See admin instructions. Take 1/2 tablet at noon and 1/2 tablet at at night (10pm) 06/11/21  Yes Saguier, Percell Miller, [name]  traMADol (ULTRAM) 50 MG tablet TAKE 1 TABLET BY MOUTH EVERY 8 HOURS AS NEEDED FOR SEVERE PAIN Patient taking differently: Take 50 mg by mouth See admin instructions. Take 1 tablet by mouth every 4 to 6 hours for severe pain. May take 1 additional tablet at night if needed 0700, 1200, 1700, 2200 05/02/21  Yes Saguier, Percell Miller, [name]  amLODipine (NORVASC) 10 MG tablet TAKE 1 TABLET BY MOUTH  DAILY Patient not taking: Reported on 03/17/2022 06/11/21   Saguier, Percell Miller, [name]  hydrochlorothiazide (MICROZIDE) 12.5 MG capsule 1 tab po today and repeat on Friday. Patient not taking: Reported on 03/17/2022 02/27/21   Saguier, Percell Miller, [name]  mupirocin ointment (BACTROBAN) 2 % APPLY  OINTMENT TOPICALLY TO AFFECTED AREA TWICE DAILY 12/09/19   Saguier, Percell Miller, [name]  nystatin cream (MYCOSTATIN) APPLY  CREAM TOPICALLY TO AFFECTED AREA TWICE DAILY 01/02/21   Saguier, Percell Miller, [name]  OXcarbazepine (TRILEPTAL) 150 MG tablet Take by mouth. Patient not taking: Reported on 03/17/2022 02/09/21   [provider]  traMADol (ULTRAM) 50 MG tablet Take 1 tablet (50 mg total) by mouth every 8 (eight) hours as needed. for pain 03/07/21   Saguier,  Percell Miller, [name]    Current Facility-Administered Medications  Medication Dose Route Frequency Provider Last Rate Last Admin   cefTRIAXone (ROCEPHIN) 2 g in sodium chloride 0.9 % 100 mL IVPB  2 g Intravenous Once Kristopher Oppenheim, DO 200 mL/hr at 03/17/22 0835 2 g at 03/17/22 0835   pantoprozole (PROTONIX) 80 mg /NS 100 mL infusion  8 mg/hr Intravenous Continuous Kristopher Oppenheim, DO 10 mL/hr at 03/17/22 0033 8 mg/hr at 03/17/22 0033    Allergies as of 03/16/2022   (No Known Allergies)     Review of Systems:    Constitutional: No weight loss,  fever or chills Skin: No rash  Cardiovascular: No chest pain  Respiratory: No SOB Gastrointestinal: See HPI and otherwise negative Genitourinary: No dysuria  Neurological: No headache, dizziness or syncope Musculoskeletal: No new muscle or joint pain Hematologic: No bruising Psychiatric: No history of depression or anxiety    Physical Exam:  Vital signs in last 24 hours: Temp:  [97.5 F (36.4 C)-99 F (37.2 C)] 97.7 F (36.5 C) (09/17 0810) Pulse Rate:  [70-106] 78 (09/17 0810) Resp:  [14-22] 15 (09/17 0810) BP: (100-123)/(51-62) 123/53 (09/17 0810) SpO2:  [93 %-100 %] 95 % (09/17 0810)   General:   Pleasant Elderly Caucasian male appears to be in NAD, Well developed, Well nourished, alert and cooperative Head:  Normocephalic and atraumatic. Eyes:   PEERL, EOMI. No icterus. Conjunctiva pink. Ears:  Normal auditory acuity. Neck:  Supple Throat: Oral cavity and pharynx without inflammation, swelling or lesion. Lungs: Respirations even and unlabored. Lungs clear to auscultation bilaterally.   No wheezes, crackles, or rhonchi.  Heart: Normal S1, S2. No MRG. Regular rate and rhythm. No peripheral edema, cyanosis or pallor.  Abdomen:  Soft, nondistended, mild epigastric ttp. No rebound or guarding. Normal bowel sounds. No appreciable masses or hepatomegaly. Rectal:  Not performed.  Msk:  Symmetrical without gross deformities. Peripheral pulses intact.   Extremities:  Without edema, no deformity or joint abnormality.  Neurologic:  Alert and  oriented x4;  grossly normal neurologically. Skin:   Dry and intact. +erythema of both legs b/l, wound vac right thigh Psychiatric: Demonstrates good judgement and reason without abnormal affect or behaviors.   LAB RESULTS:    Latest Ref Rng & Units 03/16/2022    8:24 PM 07/10/2020   11:18 AM 03/10/2020   12:10 PM  CBC  WBC 4.0 - 10.5 K/uL 18.0  5.1  6.3   Hemoglobin 13.0 - 17.0 g/dL 5.5  13.4  13.5   Hematocrit 39.0 - 52.0 % 17.5  40.5  40.9   Platelets 150 - 400 K/uL 409  241.0  231.0     BMET Recent Labs    03/16/22 2024  NA 139  K 4.0  CL 109  CO2 24  GLUCOSE 132*  BUN 60*  CREATININE 1.09  CALCIUM 8.1*    STUDIES: DG Chest 2 View  Result Date: 03/16/2022 CLINICAL DATA:  Chest pain EXAM: CHEST - 2 VIEW COMPARISON:  03/08/2022 FINDINGS: Right basilar atelectasis. No focal airspace consolidation. No pleural effusion or pneumothorax. Mild cardiomegaly is unchanged. IMPRESSION: Right basilar atelectasis. Electronically Signed   By: Ulyses Jarred M.D.   On: 03/16/2022 20:41      Impression / Plan:   Impression: 1.  Melena with acute blood loss anemia: Hemoglobin 13.4 in January 2022, down to 5.5 at time of admission yesterday evening--> 2 units PRBCs--> recheck pending; consider most likely upper GI bleed 2.  Open wound of right thigh: Recent I&D, elevated WBC, started on IV Ancef and continued on wound VAC 3.  NSTEMI: Thought likely from demand ischemia from symptomatic anemia/GI bleeding, aspirin on hold 4.  OSA on CPAP 5.  Nocturnal hypoxemia: Continue with nocturnal oxygen 6.  Restless leg syndrome  Plan: We will plan for EGD today with Dr. [name].  Did discuss risks,  benefits, limitations and alternatives and patient agrees to proceed. Patient remain n.p.o. for now Continue to monitor hemoglobin with transfusion as needed less than 7 Ordered stat hemoglobin now to make sure  hemoglobin is above 7 now after 2 units of  PRBCs overnight Continue Pantoprazole infusion for now Further recommendations after time of EGD this afternoon currently scheduled around 2:30  Thank you for your kind consultation, we will continue to follow.  Lavone Nian [facility]  03/17/2022, 8:38 AM

## 2022-03-17 NOTE — Op Note (Signed)
[facility] Patient Name: Brandon Mason Procedure Date : 03/17/2022 MRN: [id number] Attending MD: Georgian Co ,  Date of Birth: 08/08/1925 CSN: [id number] Age: 88 Admit Type: Inpatient Procedure:                Upper GI endoscopy Indications:              Melena, Anemia Providers:                Adline Mango" Carmin Richmond, RN,                            Cherylynn Ridges, Technician Referring MD:              Medicines:                Monitored Anesthesia Care Complications:            No immediate complications. Estimated Blood Loss:     Estimated blood loss was minimal. Procedure:                Pre-Anesthesia Assessment:                           - Prior to the procedure, a History and Physical                            was performed, and patient medications and                            allergies were reviewed. The patient's tolerance of                            previous anesthesia was also reviewed. The risks                            and benefits of the procedure and the sedation                            options and risks were discussed with the patient.                            All questions were answered, and informed consent                            was obtained. Prior Anticoagulants: The patient has                            taken no previous anticoagulant or antiplatelet                            agents. ASA Grade Assessment: III - A patient with                            severe systemic disease. After reviewing the risks  and benefits, the patient was deemed in                            satisfactory condition to undergo the procedure.                           After obtaining informed consent, the endoscope was                            passed under direct vision. Throughout the                            procedure, the patient's blood pressure, pulse, and                            oxygen  saturations were monitored continuously. The                            [id number]-H190 ([id number]) Olympus endoscope was introduced                            through the mouth, and advanced to the second part                            of duodenum. The upper GI endoscopy was                            accomplished without difficulty. The patient                            tolerated the procedure well. Scope In: Scope Out: Findings:      Three cratered esophageal ulcers with no bleeding and no stigmata of       recent bleeding were found at the gastroesophageal junction. The largest       lesion was 4 mm in largest dimension.      Localized inflammation characterized by congestion (edema), erosions and       erythema was found in the gastric fundus and in the gastric antrum.      The examined duodenum was normal. Impression:               - Esophageal ulcers with no bleeding and no                            stigmata of recent bleeding.                           - Gastritis.                           - Normal examined duodenum.                           - No specimens collected. Recommendation:           - Return patient to hospital ward for ongoing care.                           -  Use a proton pump inhibitor PO BID for 12 weeks.                           - Consider repeat EGD to assess for healing of                            esophageal ulcers.                           - No ibuprofen, naproxen, or other non-steroidal                            anti-inflammatory drugs.                           - Return to GI clinic at appointment to be                            scheduled.                           - The findings and recommendations were discussed                            with the patient. Procedure Code(s):        --- Professional ---                           [id number], Esophagogastroduodenoscopy, flexible,                            transoral; diagnostic, including collection of                             specimen(s) by brushing or washing, when performed                            (separate procedure) Diagnosis Code(s):        --- Professional ---                           K22.10, Ulcer of esophagus without bleeding                           K29.70, Gastritis, unspecified, without bleeding                           K92.1, Melena (includes Hematochezia)                           D64.9, Anemia, unspecified CPT copyright 2019 American Medical Association. All rights reserved. The codes documented in this report are preliminary and upon coder review may  be revised to meet current compliance requirements. Dr Georgian Co "[name]" [name],  03/17/2022 3:52:16 PM Number of Addenda: 0

## 2022-03-17 NOTE — Anesthesia Procedure Notes (Signed)
Procedure Name: MAC Date/Time: 03/17/2022 3:31 PM  Performed by: Reece Agar, CRNAPre-anesthesia Checklist: Patient identified, Emergency Drugs available, Suction available and Patient being monitored Patient Re-evaluated:Patient Re-evaluated prior to induction Oxygen Delivery Method: Nasal cannula

## 2022-03-17 NOTE — Consult Note (Signed)
WOC Nurse Consult Note:  Consult received from Dr. Georgena Spurling for placement of NPWT dressing to right medial thigh site of hematoma evacuation in accordance with frequency established at [facility].  Discussed with Bedside RN and dressing is attached to a 40M Acti-V.A.C portable system. Supplies ordered to bedside today include V.A.C. pump, 3 Medium NPWT (V.A.C.) dressing kits, and 3 Mepitel dressings. Orders provided to Orders to include supply numbers/information for obtaining future supplies.  [facility] Nursing will change dressing tomorrow to determine if their service is needed to continue NPWT dressings or if this dressing can be transitioned to Bedside RN.  [place] nursing team will follow, and will remain available to this patient, the nursing and medical teams.    Thank you for inviting [place] to participate in this patient's Plan of Care.  Maudie Flakes, MSN, RN, CNS, [place], Serita Grammes, [employer], [employer] phone:  [phone]

## 2022-03-17 NOTE — Progress Notes (Signed)
Per pharmacy [facility], patient's dosages of protonix and ancef are compatible to run together.

## 2022-03-17 NOTE — Consult Note (Addendum)
Cardiology Consultation   Patient ID: Brandon Mason MRN: [id number]; DOB: Feb 12, 1926  Admit date: 03/16/2022 Date of Consult: 03/17/2022  PCP:  Clovia Cuff, MD   [facility] Providers Cardiologist: New to [facility] Dr. Sallyanne Kuster \   Patient Profile:   Brandon Mason is a 88 y.o. male with a hx of hypertension, type 2 diabetes, RLS, BPH, OSA, CVA, DVT 2013 completed anticoagulation with Eliquis, cervical herniated nucleus pulposus with myelopathy s/p decompression C3/4 2017,  who is being seen 03/17/2022 for the evaluation of elevated troponin at the request of Dr. Lonny Prude.  History of Present Illness:   [name] presented to the ER with midsternal chest pain, SOB, fatigue, weakness, and black stool.   He remotely saw cardiology at [place] Dr. Wyline Copas on 08/17/2015 for progressive dyspnea on exertion and fatigue, had abnormal EKG showed bifascicular block, was recommended stress echocardiogram. Results can not been seen at this time on care everywhere.  He does not remember the results. His son is at bedside and reports he was concerned for CHF at one point, but this was never clearly communicated by his doctor.    He was hospitalized at [facility] 03/07/2022 to 03/11/2022 for multiple falls at home, cellulitis with infected hematoma on right knee/thigh required I&D and antibiotic and wound VAC placement.  An Echo from 03/08/2022 at [facility] was obtained due to concern of CHF, revealed mild concentric LVH, LVEF 55 to 60%, trace AR, mild TR, mild pulmonary hypertension, no pericardial effusion.  Patient presented to the ED 03/16/2022 night with midsternal chest pain that has been going on throughout the day, started around 1 PM that day.  He was feeling short of breath and was placed on nasal cannula oxygen at home.  He also reports generalized weakness, black tarry stool over the past 4 days, and significant fatigue. At baseline, he is able  to walk 1 block and performs ADLs independently. He noted progressive dyspnea on exertion over the past few years. He has chronic legs edema that he takes diuretics for years. He has never experienced chest pain or syncope prior to current admission. He has no recent ischemic evaluation nor sees a cardiologist at baseline. He is not interested in extensive or invasive workup as he never had "heart problems". He noted black stool had stopped. He is feeling improved after getting blood transfusion.   Admission diagnostic revealed elevated BUN 60.  Lactic acid 1.5 >1.9.  CBC with hemoglobin 5.5, white blood cell 18,000, platelet 490 K.  High sensitive troponin 994 >1132.  FOB positive.  Urinalysis unremarkable.  HDL 30, LDL 42, triglyceride 88.  Chest x-ray revealed right basilar atelectasis.  EKG revealed sinus rhythm with ventricular rate of 96 bpm, old RBBB, no remarkable change.  He was concern for acute GI bleed, started on Protonix drip, GI is consulted recommended PRBC transfusion with pending EGD planned.  Given elevated troponin, cardiology is consulted today for further input.       Past Medical History:  Diagnosis Date   Abnormality of gait 02/09/2013   Arthritis    Cancer ([place])    basal cell skin cancer   Cardiomegaly    Chronic kidney disease    BPH   Chronic leg pain    Hypersomnia    Hypertension    Hypoxia    pulmonary   Neuromuscular disorder (HCC)    restless leg syndrome    Nocturia  Prostate disorder    Restless legs syndrome (RLS) 02/09/2013   Restless legs syndrome with nocturnal myoclonus    RLS (restless legs syndrome)    x 40 years   Shortness of breath    with exertion    Sleep apnea    uses cpap   Stroke [facility])    minor stroke in 2003 - no lasting side effects   UARS (upper airway resistance syndrome) 06/28/2014    Past Surgical History:  Procedure Laterality Date   ANTERIOR CERVICAL DECOMP/DISCECTOMY FUSION N/A 03/29/2016   Procedure: ANTERIOR  CERVICAL DECOMPRESSION/DISCECTOMY FUSION CERVICAL THREE- CERVICAL FOUR;  Surgeon: Ashok Pall, MD;  Location: [facility];  Service: Neurosurgery;  Laterality: N/A;   BACK SURGERY  2003   L4-5   CATARACT EXTRACTION Bilateral    HERNIA REPAIR     inguinal hernia   TRANSURETHRAL PROSTATECTOMY WITH GYRUS INSTRUMENTS  04/28/2012   Procedure: TRANSURETHRAL PROSTATECTOMY WITH GYRUS INSTRUMENTS;  Surgeon: Fredricka Bonine, MD;  Location: [facility];  Service: Urology;  Laterality: N/A;   TRANSURETHRAL RESECTION OF PROSTATE  03/24/2012   Procedure: TRANSURETHRAL RESECTION OF THE PROSTATE (TURP);  Surgeon: Fredricka Bonine, MD;  Location: [facility];  Service: Urology;  Laterality: N/A;  with gyrus ,Greenlight PVP laser of Prostate     Home Medications:  Prior to Admission medications   Medication Sig Start Date End Date Taking? Authorizing Provider  doxycycline (VIBRA-TABS) 100 MG tablet Take 1 tablet (100 mg total) by mouth 2 (two) times daily. 03/06/21  Yes Saguier, Percell Miller, [name]  finasteride (PROSCAR) 5 MG tablet TAKE 1 TABLET BY MOUTH  DAILY Patient taking differently: Take 5 mg by mouth daily. 05/03/21  Yes Saguier, Percell Miller, [name]  lisinopril (ZESTRIL) 20 MG tablet TAKE 1 TABLET BY MOUTH  DAILY Patient taking differently: Take 20 mg by mouth every evening. 04/12/21  Yes Saguier, Percell Miller, [name]  meloxicam (MOBIC) 15 MG tablet Take 15 mg by mouth at bedtime. 02/05/22  Yes [provider]  OVER THE COUNTER MEDICATION Take 1 tablet by mouth 2 (two) times daily. MetaNx vitamin/supplement   Yes [provider]  pregabalin (LYRICA) 150 MG capsule Take 150 mg by mouth 2 (two) times daily. 11/09/19  Yes [provider]  rOPINIRole (REQUIP) 4 MG tablet TAKE 1 TABLET BY MOUTH AT  BEDTIME Patient taking differently: Take 2 mg by mouth See admin instructions. Take 1/2 tablet at noon and 1/2 tablet at at night (10pm) 06/11/21  Yes Saguier, Percell Miller, [name]  traMADol (ULTRAM) 50 MG tablet  TAKE 1 TABLET BY MOUTH EVERY 8 HOURS AS NEEDED FOR SEVERE PAIN Patient taking differently: Take 50 mg by mouth See admin instructions. Take 1 tablet by mouth every 4 to 6 hours for severe pain. May take 1 additional tablet at night if needed 0700, 1200, 1700, 2200 05/02/21  Yes Saguier, Percell Miller, [name]  amLODipine (NORVASC) 10 MG tablet TAKE 1 TABLET BY MOUTH  DAILY Patient not taking: Reported on 03/17/2022 06/11/21   Saguier, Percell Miller, [name]  hydrochlorothiazide (MICROZIDE) 12.5 MG capsule 1 tab po today and repeat on Friday. Patient not taking: Reported on 03/17/2022 02/27/21   Saguier, Percell Miller, [name]  mupirocin ointment (BACTROBAN) 2 % APPLY  OINTMENT TOPICALLY TO AFFECTED AREA TWICE DAILY 12/09/19   Saguier, Percell Miller, [name]  nystatin cream (MYCOSTATIN) APPLY  CREAM TOPICALLY TO AFFECTED AREA TWICE DAILY 01/02/21   Saguier, Percell Miller, [name]  OXcarbazepine (TRILEPTAL) 150 MG tablet Take by mouth. Patient not taking: Reported on 03/17/2022 02/09/21  [provider]  traMADol (ULTRAM) 50 MG tablet Take 1 tablet (50 mg total) by mouth every 8 (eight) hours as needed. for pain 03/07/21   Mackie Pai, [name]    Inpatient Medications: Scheduled Meds:  finasteride  5 mg Oral Daily   pregabalin  150 mg Oral BID   [START ON 03/18/2022] rOPINIRole  4 mg Oral QHS   Continuous Infusions:  pantoprazole 8 mg/hr (03/17/22 0033)   PRN Meds: acetaminophen **OR** acetaminophen, ondansetron **OR** ondansetron (ZOFRAN) IV, traMADol  Allergies:   No Known Allergies  Social History:   Social History   Socioeconomic History   Marital status: Married    Spouse name: Ann   Number of children: 5   Years of education: 12   Highest education level: Not on file  Occupational History   Occupation: retired     Comment: [name] for a [employer]  Tobacco Use   Smoking status: Never   Smokeless tobacco: Never  Substance and Sexual Activity   Alcohol use: Yes    Alcohol/week: 2.0 standard drinks of alcohol     Types: 2 Cans of beer per week    Comment: weekly   Drug use: No   Sexual activity: Not on file  Other Topics Concern   Not on file  Social History Narrative   Patient lives at home with his wife [name].   Patient is retired.    Patient has 5 children.   Patient has a high school education.   Patient is right-handed.   Patient drinks very little caffeine.   Social Determinants of Health   Financial Resource Strain: Not on file  Food Insecurity: Not on file  Transportation Needs: Not on file  Physical Activity: Not on file  Stress: Not on file  Social Connections: Not on file  Intimate Partner Violence: Not on file    Family History:    Family History  Problem Relation Age of Onset   Restless legs syndrome Mother    Restless legs syndrome Daughter    Esophageal cancer Neg Hx    Colon cancer Neg Hx    Pancreatic cancer Neg Hx    Stomach cancer Neg Hx      ROS:  Constitutional: fatigue  Eyes: Denied vision change or loss Ears/Nose/Mouth/Throat: Denied ear ache, sore throat, coughing, sinus pain Cardiovascular: see HPI  Respiratory: chronic DOE  Gastrointestinal: Denied nausea, vomiting, abdominal pain, diarrhea Genital/Urinary: Denied dysuria, hematuria, urinary frequency/urgency Musculoskeletal: right leg wound  Skin: Denied rash, wound Neuro: Denied headache, syncope Psych: Denied history of depression/anxiety  Endocrine: history of diabetes   Physical Exam/Data:   Vitals:   03/17/22 0321 03/17/22 0454 03/17/22 0601 03/17/22 0810  BP: 114/60 112/60 115/62 (!) 123/53  Pulse: 77 70 75 78  Resp:   15 15  Temp:  97.7 F (36.5 C) (!) 97.5 F (36.4 C) 97.7 F (36.5 C)  TempSrc:  Oral Oral Oral  SpO2: 99% 94% 95% 95%    Intake/Output Summary (Last 24 hours) at 03/17/2022 1139 Last data filed at 03/17/2022 0808 Gross per 24 hour  Intake 293.75 ml  Output 625 ml  Net -331.25 ml      03/15/2021    9:32 AM 03/06/2021    8:52 AM 02/27/2021    4:16 PM  Last 3  Weights  Weight (lbs) 178 lb 173 lb 12.8 oz 176 lb  Weight (kg) 80.74 kg 78.835 kg 79.833 kg     There is no height or weight on  file to calculate BMI.   Vitals:  Vitals:   03/17/22 0601 03/17/22 0810  BP: 115/62 (!) 123/53  Pulse: 75 78  Resp: 15 15  Temp: (!) 97.5 F (36.4 C) 97.7 F (36.5 C)  SpO2: 95% 95%   General Appearance: In no apparent distress, laying in bed HEENT: Normocephalic, atraumatic.  Neck: Supple, trachea midline, no JVDs. Cardiovascular: Regular rate and rhythm, normal S1-S2,  no murmur Respiratory: Resting breathing unlabored, lungs sounds clear to auscultation bilaterally, no use of accessory muscles. On room air.  No wheezes, rales or rhonchi.   Gastrointestinal: Bowel sounds positive, abdomen soft Extremities: Able to move all extremities in bed without difficulty, right thigh with wound vac, RLE with erythema and 2+ edema consistent with cellulitis, LLE trace edema  Musculoskeletal: Normal muscle bulk and tone Skin: Intact, warm, dry. No rashes  Neurologic: Alert, oriented to person, place and time. Fluent speech, HOH, no cognitive deficit Psychiatric: Normal affect. Mood is appropriate.     EKG:  The EKG was personally reviewed and demonstrates:    Sinus rhythm, 96bpm, old RBBB  Telemetry:  Telemetry was personally reviewed and demonstrates:    Sinus rhythm/tachycardia 70-110s, PACs    Relevant CV Studies:  Ech[facility] from [facility]:  There is mild concentric left ventricular hypertrophy.  LV ejection fraction = 55-60%.  There is trace aortic regurgitation.  There is mild tricuspid regurgitation.  Mild pulmonary hypertension.  There is no pericardial effusion.  Compared to the last study dated 11/16/2013, pulmonary pressure has  increased slightly   Laboratory Data:  High Sensitivity Troponin:   Recent Labs  Lab 03/16/22 2024 03/16/22 2221  TROPONINIHS 994* 1,132*     Chemistry Recent Labs  Lab  03/16/22 2024  NA 139  K 4.0  CL 109  CO2 24  GLUCOSE 132*  BUN 60*  CREATININE 1.09  CALCIUM 8.1*  GFRNONAA >60  ANIONGAP 6    No results for input(s): "PROT", "ALBUMIN", "AST", "ALT", "ALKPHOS", "BILITOT" in the last 168 hours. Lipids  Recent Labs  Lab 03/17/22 0731  CHOL 90  TRIG 88  HDL 30*  LDLCALC 42  CHOLHDL 3.0    Hematology Recent Labs  Lab 03/16/22 2024  WBC 18.0*  RBC 1.65*  HGB 5.5*  HCT 17.5*  MCV 106.1*  MCH 33.3  MCHC 31.4  RDW 14.5  PLT 409*   Thyroid No results for input(s): "TSH", "FREET4" in the last 168 hours.  BNPNo results for input(s): "BNP", "PROBNP" in the last 168 hours.  DDimer No results for input(s): "DDIMER" in the last 168 hours.   Radiology/Studies:  DG Chest 2 View  Result Date: 03/16/2022 CLINICAL DATA:  Chest pain EXAM: CHEST - 2 VIEW COMPARISON:  03/08/2022 FINDINGS: Right basilar atelectasis. No focal airspace consolidation. No pleural effusion or pneumothorax. Mild cardiomegaly is unchanged. IMPRESSION: Right basilar atelectasis. Electronically Signed   By: Ulyses Jarred M.D.   On: 03/16/2022 20:41     Assessment and Plan:   Non-STEMI -Presented with chest pain, shortness of breath, generalized weakness, fatigue in the setting of GI bleed with severe anemia Hgb 5.5 - Hs trop 994 >1132 - HDL 30, LDL 42 - EKG without acute ischemic changes - Echo from 03/08/2022 at outside facility with preserved LVEF 55 to 60% - Possible type II demand ischemia, although there is no recent ischemic work-up in the past, stress echo at outside facility noted 2017, no results available; nevertheless, he is having acute GI  bleed with significant anemia 5.5 pending GI workup, also not interested in pursuing extensive cardiac work up as he had no cardiac symptoms in the past,  thus will hold off invasive ischemic evaluation/intervention -Medical therapy: can start Metoprolol XL '25mg'$  daily and stop home med amlodipine, no statin, HOLD off ASA and  no heparin gtt given acute GI bleed, may resume home lisinopril '20mg'$  (if BP is stable)  Chronic diastolic heart failure - reports chronic DOE , leg edema, takes HCTZ at home - Echo from mild concentric LVH, LVEF 55 to 60%, trace AR, mild TR, mild pulmonary hypertension, no pericardial effusion. - clinically euvolemic today, can add PRN lasix if CHF symptoms worsens - GDMT: probably will not add SGLT2I given his advanced age, defer to outpatient discussion   HTN - BP low normal currently likely due to GI bleed - would hold home antihypertensive until BP stable   Acute GI blood loss associated anemia  Right leg cellulitis with wound post abscess I&D OSA RLS - per primary team     Risk Assessment/Risk Scores:   New York Heart Association (NYHA) Functional Class NYHA Class II        For questions or updates, please contact [place] Please consult [email or web address] for contact info under    Signed, Margie Billet, NP  03/17/2022 11:39 AM   I have seen and examined the patient along with Margie Billet, NP .  I have reviewed the chart, notes and new data.  I agree with PA/NP's note.  Key new complaints: Had transient chest pressure yesterday, currently resolved.  Has had ankle swelling for years.  For most of that period of time this was resolved with low-dose hydrochlorothiazide, but recently has also been prescribed furosemide 20 mg daily.  He has been taking meloxicam daily for the last 3 to 4 months due to problems with back and leg pain.  When he started taking the meloxicam he also noticed a burning sensation in his chest that has persisted for the last several months.  Started having melena 4 days ago. Key examination changes: Pale, but appears comfortable in no distress.  Clear lungs, no JVD, 2+ pedal and ankle edema, regular rate and rhythm, no murmurs. Key new findings / data: ECG shows pre-existing right bundle branch block and bifascicular block, and normal sinus rhythm on  telemetry since admission.  Troponin elevation peaking at roughly 1100.  Hemoglobin on admission 5.5, compared to an average of 10.0 last week during his hospitalization in [facility].  Note that he had macrocytic anemia.  Folate and B12 levels were normal last week.  Reviewed the echo report from [facility] showing normal LV systolic function and evidence of probably grade 2 diastolic dysfunction with very mild pulmonary hypertension.  Has some mild LVH, likely consequence of decades of systemic hypertension.  PLAN: It is quite likely that at age 68 he has some underlying coronary problems, although until he became severely anemic this was asymptomatic. Presentation is highly suggestive of demand myocardial infarction, not a true acute atherothrombotic event. He would not be a candidate for coronary revascularization at this time, until the source of bleeding is identified and healed. I think he would benefit from upper endoscopy to clarify the source of bleeding and possibly treated.  Stop NSAIDs which are the most likely culprit.  No plan for invasive cardiac evaluation at this time.   Sanda Klein, MD, [place] [phone] 03/17/2022, 12:51 PM

## 2022-03-17 NOTE — Interval H&P Note (Signed)
History and Physical Interval Note:  03/17/2022 3:22 PM  Brandon Mason  has presented today for surgery, with the diagnosis of Melena.  The various methods of treatment have been discussed with the patient and family. After consideration of risks, benefits and other options for treatment, the patient has consented to  Procedure(s): ESOPHAGOGASTRODUODENOSCOPY (EGD) WITH PROPOFOL (N/A) as a surgical intervention.  The patient's history has been reviewed, patient examined, no change in status, stable for surgery.  I have reviewed the patient's chart and labs.  Questions were answered to the patient's satisfaction.     Sharyn Creamer

## 2022-03-17 NOTE — Anesthesia Preprocedure Evaluation (Signed)
Anesthesia Evaluation  Patient identified by MRN, date of birth, ID band Patient awake    Reviewed: Allergy & Precautions, H&P , NPO status , Patient's Chart, lab work & pertinent test results  Airway Mallampati: II   Neck ROM: full    Dental   Pulmonary shortness of breath, sleep apnea ,    breath sounds clear to auscultation       Cardiovascular hypertension, + Past MI   Rhythm:regular Rate:Normal  Admitted with NSTEMI. Troponin >1000. Cardiology consulted suspects demand ischemia from anemia.  No further workup.   Neuro/Psych CVA    GI/Hepatic GI bleeding   Endo/Other    Renal/GU      Musculoskeletal   Abdominal   Peds  Hematology  (+) Blood dyscrasia, anemia ,   Anesthesia Other Findings   Reproductive/Obstetrics                             Anesthesia Physical Anesthesia Plan  ASA: 3  Anesthesia Plan: MAC   Post-op Pain Management:    Induction: Intravenous  PONV Risk Score and Plan: 1 and Propofol infusion and Treatment may vary due to age or medical condition  Airway Management Planned: Nasal Cannula  Additional Equipment:   Intra-op Plan:   Post-operative Plan:   Informed Consent: I have reviewed the patients History and Physical, chart, labs and discussed the procedure including the risks, benefits and alternatives for the proposed anesthesia with the patient or authorized representative who has indicated his/her understanding and acceptance.       Plan Discussed with: CRNA, Anesthesiologist and Surgeon  Anesthesia Plan Comments:         Anesthesia Quick Evaluation

## 2022-03-17 NOTE — Progress Notes (Signed)
PROGRESS NOTE    Brandon Mason  OAC:[id number] DOB: Feb 03, 1926 DOA: 03/16/2022 PCP: Clovia Cuff, MD   Brief Narrative: Brandon Mason is a 88 y.o. male with a history of OSA, chronic pain, restless legs, hypertension, recent I&D s/p wound vac. Patient presented secondary to report of black stools and found to have severe anemia (hemoglobin of 5.5)  with associated elevated troponin. GI and cardiology consulted. Protonix drip started and 2 units of PRBC transfused. Plan for upper endoscopy.   Assessment and Plan: * GI bleeding Patient with a history of melena. Associated severe anemia. GI consulted. Protonix drip initiated. -GI recommendations: EGD today -Continue Protonix drip  Open wound of right thigh Recent I&D of infected hematoma and wound vac placement. Patient discharged on doxycycline with end date of 9/23. Wound vac recommendations for dressing changes every Monday, Wednesday and Friday. Leukocytosis on admission without fever and without obvious evidence of infection. Blood cultures obtained on admission. Ceftriaxone IV x1 received on admission. -Cefazolin IV -Trend CBC -Follow-up blood cultures -Wound consult for wound vac management  NSTEMI (non-ST elevated myocardial infarction) (HCC) Troponin of 994 > 1,132. Associated chest pressure and in setting of acute severe anemia. No ischemic changes on EKG. Cardiology consulted on admission and recommended no heparin IV secondary to GI bleeding and severe anemia. Transthoracic Echocardiogram ordered and pending -Cardiology recommendations: pending today -Follow-up Transthoracic Echocardiogram -Trend troponin until peak  Symptomatic anemia Recent hemoglobin of 10.7 from 9/11. Acute anemia secondary to melena and presumed GI bleeding. Hemoglobin of 5.5 on admission. Two units of PRBC ordered and transfused. -Post-transfusion H&H  Chronic pain syndrome -Continue Tramadol PRN  Essential hypertension Patient is on  amlodipine, hydrochlorothiazide and lisinopril as an outpatient, which were held secondary to acute anemia and concern for precipitating hypotension.  Nocturnal hypoxemia -Continue nocturnal oxyen  OSA on CPAP -Continue CPAP  Restless legs syndrome (RLS) -Continue Lyrica, Requip and Tramadol prn  DNR (do not resuscitate)/DNI(Do Not Intubate) Verified on admission.    DVT prophylaxis: SCDs Code Status:   Code Status: DNR Family Communication: None at bedside Disposition Plan: Discharge pending GI/Cardiology recommendations/management   Consultants:  [facility]  Procedures:  None  Antimicrobials: Ceftriaxone Cefazolin    Subjective: Patient reports no chest pain this morning. No specific concerns. No melena overnight.  Objective: BP 129/74 (BP Location: Right Arm)   Pulse 86   Temp 97.6 F (36.4 C) (Oral)   Resp 15   Ht '5\' 8"'$  (1.727 m)   Wt 80.4 kg   SpO2 95%   BMI 26.94 kg/m   Examination:  General exam: Appears calm and comfortable Respiratory system: Clear to auscultation. Respiratory effort normal. Cardiovascular system: S1 & S2 heard, RRR. Gastrointestinal system: Abdomen is nondistended, soft and nontender. Normal bowel sounds heard. Central nervous system: Alert and oriented. No focal neurological deficits. Musculoskeletal: BLE pitting edema. No calf tenderness Skin: mild dermatitis of right lower extremity Psychiatry: Judgement and insight appear normal. Mood & affect appropriate.    Data Reviewed: I have personally reviewed following labs and imaging studies  CBC Lab Results  Component Value Date   WBC 18.0 (H) 03/16/2022   RBC 1.65 (L) 03/16/2022   HGB 5.5 (LL) 03/16/2022   HCT 17.5 (L) 03/16/2022   MCV 106.1 (H) 03/16/2022   MCH 33.3 03/16/2022   PLT 409 (H) 03/16/2022   MCHC 31.4 03/16/2022   RDW 14.5 03/16/2022   LYMPHSABS 1.6 07/10/2020   MONOABS 0.7 07/10/2020   EOSABS  0.2 07/10/2020   BASOSABS  0.0 85/46/2703     Last metabolic panel Lab Results  Component Value Date   NA 139 03/16/2022   K 4.0 03/16/2022   CL 109 03/16/2022   CO2 24 03/16/2022   BUN 60 (H) 03/16/2022   CREATININE 1.09 03/16/2022   GLUCOSE 132 (H) 03/16/2022   GFRNONAA >60 03/16/2022   GFRAA >60 01/20/2018   CALCIUM 8.1 (L) 03/16/2022   PROT 6.9 07/10/2020   ALBUMIN 4.2 07/10/2020   LABGLOB 2.9 05/14/2013   AGRATIO 1.4 05/14/2013   BILITOT 0.4 07/10/2020   ALKPHOS 74 07/10/2020   AST 12 07/10/2020   ALT 10 07/10/2020   ANIONGAP 6 03/16/2022    GFR: Estimated Creatinine Clearance: 41 mL/min (by C-G formula based on SCr of 1.09 mg/dL).  Recent Results (from the past 240 hour(s))  SARS Coronavirus 2 by RT PCR (hospital order, performed in [facility] hospital lab) *cepheid single result test* Anterior Nasal Swab     Status: None   Collection Time: 03/16/22 10:20 PM   Specimen: Anterior Nasal Swab  Result Value Ref Range Status   SARS Coronavirus 2 by RT PCR NEGATIVE NEGATIVE Final    Comment: (NOTE) SARS-CoV-2 target nucleic acids are NOT DETECTED.  The SARS-CoV-2 RNA is generally detectable in upper and lower respiratory specimens during the acute phase of infection. The lowest concentration of SARS-CoV-2 viral copies this assay can detect is 250 copies / mL. A negative result does not preclude SARS-CoV-2 infection and should not be used as the sole basis for treatment or other patient management decisions.  A negative result may occur with improper specimen collection / handling, submission of specimen other than nasopharyngeal swab, presence of viral mutation(s) within the areas targeted by this assay, and inadequate number of viral copies (<250 copies / mL). A negative result must be combined with clinical observations, patient history, and epidemiological information.  Fact Sheet for Patients:   https://www.patel.info/  Fact Sheet for Healthcare  Providers: https://hall.com/  This test is not yet approved or  cleared by the [place] FDA and has been authorized for detection and/or diagnosis of SARS-CoV-2 by FDA under an Emergency Use Authorization (EUA).  This EUA will remain in effect (meaning this test can be used) for the duration of the COVID-19 declaration under Section 564(b)(1) of the Act, 21 U.S.C. section 360bbb-3(b)(1), unless the authorization is terminated or revoked sooner.  Performed at [facility], [place] [address], [place], [place] [address]   Culture, blood (routine x 2)     Status: None (Preliminary result)   Collection Time: 03/16/22 10:21 PM   Specimen: BLOOD  Result Value Ref Range Status   Specimen Description BLOOD RIGHT ANTECUBITAL  Final   Special Requests   Final    BOTTLES DRAWN AEROBIC AND ANAEROBIC Blood Culture results may not be optimal due to an excessive volume of blood received in culture bottles   Culture   Final    NO GROWTH < 12 HOURS Performed at [facility], [place] [address] [place],  [address]    Report Status PENDING  Incomplete      Radiology Studies: DG Chest 2 View  Result Date: 03/16/2022 CLINICAL DATA:  Chest pain EXAM: CHEST - 2 VIEW COMPARISON:  03/08/2022 FINDINGS: Right basilar atelectasis. No focal airspace consolidation. No pleural effusion or pneumothorax. Mild cardiomegaly is unchanged. IMPRESSION: Right basilar atelectasis. Electronically Signed   By: Ulyses Jarred M.D.   On: 03/16/2022  20:41      LOS: 1 day    Brandon Poche, MD [facility] 03/17/2022, 12:20 PM   If 7PM-7AM, please contact night-coverage [email or web address]

## 2022-03-17 NOTE — Plan of Care (Signed)
?  Problem: Nutrition: ?Goal: Adequate nutrition will be maintained ?Outcome: Progressing ?  ?Problem: Safety: ?Goal: Ability to remain free from injury will improve ?Outcome: Progressing ?  ?Problem: Pain Managment: ?Goal: General experience of comfort will improve ?Outcome: Not Progressing ?  ?

## 2022-03-17 NOTE — Transfer of Care (Signed)
Immediate Anesthesia Transfer of Care Note  Patient: Brandon Mason  Procedure(s) Performed: ESOPHAGOGASTRODUODENOSCOPY (EGD) WITH PROPOFOL  Patient Location: PACU  Anesthesia Type:MAC  Level of Consciousness: drowsy  Airway & Oxygen Therapy: Patient Spontanous Breathing and Patient connected to nasal cannula oxygen  Post-op Assessment: Report given to RN and Post -op Vital signs reviewed and stable  Post vital signs: Reviewed and stable  Last Vitals:  Vitals Value Taken Time  BP 117/65 03/17/22 1553  Temp    Pulse 82 03/17/22 1554  Resp 21 03/17/22 1554  SpO2 97 % 03/17/22 1554  Vitals shown include unvalidated device data.  Last Pain:  Vitals:   03/17/22 1512  TempSrc: Temporal  PainSc: 4          Complications: No notable events documented.

## 2022-03-18 ENCOUNTER — Encounter (HOSPITAL_COMMUNITY): Payer: Self-pay | Admitting: Internal Medicine

## 2022-03-18 ENCOUNTER — Inpatient Hospital Stay (HOSPITAL_COMMUNITY): Payer: Medicare HMO

## 2022-03-18 ENCOUNTER — Telehealth: Payer: Self-pay

## 2022-03-18 DIAGNOSIS — K2970 Gastritis, unspecified, without bleeding: Secondary | ICD-10-CM

## 2022-03-18 DIAGNOSIS — K2210 Ulcer of esophagus without bleeding: Secondary | ICD-10-CM

## 2022-03-18 DIAGNOSIS — I214 Non-ST elevation (NSTEMI) myocardial infarction: Secondary | ICD-10-CM | POA: Diagnosis not present

## 2022-03-18 DIAGNOSIS — I10 Essential (primary) hypertension: Secondary | ICD-10-CM | POA: Diagnosis not present

## 2022-03-18 DIAGNOSIS — G894 Chronic pain syndrome: Secondary | ICD-10-CM | POA: Diagnosis not present

## 2022-03-18 DIAGNOSIS — K922 Gastrointestinal hemorrhage, unspecified: Secondary | ICD-10-CM | POA: Diagnosis not present

## 2022-03-18 DIAGNOSIS — D649 Anemia, unspecified: Secondary | ICD-10-CM | POA: Diagnosis not present

## 2022-03-18 LAB — CBC
HCT: 20.6 % — ABNORMAL LOW (ref 39.0–52.0)
Hemoglobin: 6.9 g/dL — CL (ref 13.0–17.0)
MCH: 33.2 pg (ref 26.0–34.0)
MCHC: 33.5 g/dL (ref 30.0–36.0)
MCV: 99 fL (ref 80.0–100.0)
Platelets: 357 10*3/uL (ref 150–400)
RBC: 2.08 MIL/uL — ABNORMAL LOW (ref 4.22–5.81)
RDW: 18.9 % — ABNORMAL HIGH (ref 11.5–15.5)
WBC: 10.5 10*3/uL (ref 4.0–10.5)
nRBC: 0.2 % (ref 0.0–0.2)

## 2022-03-18 LAB — ECHOCARDIOGRAM COMPLETE
AR max vel: 1.42 cm2
AV Area VTI: 1.53 cm2
AV Area mean vel: 1.36 cm2
AV Mean grad: 8 mmHg
AV Peak grad: 14 mmHg
Ao pk vel: 1.87 m/s
Area-P 1/2: 3.77 cm2
Calc EF: 45.6 %
Height: 68 in
MV M vel: 2.04 m/s
MV Peak grad: 16.6 mmHg
P 1/2 time: 445 msec
S' Lateral: 3.6 cm
Single Plane A2C EF: 40.3 %
Single Plane A4C EF: 49.5 %
Weight: 2832 oz

## 2022-03-18 LAB — TROPONIN I (HIGH SENSITIVITY)
Troponin I (High Sensitivity): 1128 ng/L (ref <18)
Troponin I (High Sensitivity): 1181 ng/L (ref <18)

## 2022-03-18 LAB — HEMOGLOBIN AND HEMATOCRIT, BLOOD
HCT: 24.6 % — ABNORMAL LOW (ref 39.0–52.0)
Hemoglobin: 8.1 g/dL — ABNORMAL LOW (ref 13.0–17.0)

## 2022-03-18 LAB — BASIC METABOLIC PANEL
Anion gap: 9 (ref 5–15)
BUN: 33 mg/dL — ABNORMAL HIGH (ref 8–23)
CO2: 24 mmol/L (ref 22–32)
Calcium: 8.3 mg/dL — ABNORMAL LOW (ref 8.9–10.3)
Chloride: 111 mmol/L (ref 98–111)
Creatinine, Ser: 1.04 mg/dL (ref 0.61–1.24)
GFR, Estimated: >60 mL/min (ref >60)
Glucose, Bld: 115 mg/dL — ABNORMAL HIGH (ref 70–99)
Potassium: 3.9 mmol/L (ref 3.5–5.1)
Sodium: 144 mmol/L (ref 135–145)

## 2022-03-18 LAB — H. PYLORI ANTIBODY, IGG: H Pylori IgG: 0.21 Index Value (ref 0.00–0.79)

## 2022-03-18 LAB — PREPARE RBC (CROSSMATCH)

## 2022-03-18 MED ORDER — PANTOPRAZOLE SODIUM 40 MG PO TBEC
40.0000 mg | DELAYED_RELEASE_TABLET | Freq: Two times a day (BID) | ORAL | Status: DC
  Administered 2022-03-18 – 2022-03-20 (×5): 40 mg via ORAL
  Filled 2022-03-18 (×5): qty 1

## 2022-03-18 MED ORDER — TRAMADOL HCL 50 MG PO TABS: 50.0000 mg | ORAL_TABLET | Freq: Four times a day (QID) | ORAL | Status: DC | PRN

## 2022-03-18 MED ORDER — PREGABALIN 75 MG PO CAPS
150.0000 mg | ORAL_CAPSULE | Freq: Two times a day (BID) | ORAL | Status: DC
  Administered 2022-03-18 – 2022-03-20 (×4): 150 mg via ORAL
  Filled 2022-03-18 (×4): qty 2

## 2022-03-18 MED ORDER — FUROSEMIDE 20 MG PO TABS
20.0000 mg | ORAL_TABLET | Freq: Every day | ORAL | Status: DC
  Administered 2022-03-18 – 2022-03-20 (×3): 20 mg via ORAL
  Filled 2022-03-18 (×3): qty 1

## 2022-03-18 MED ORDER — TRAMADOL HCL 50 MG PO TABS
50.0000 mg | ORAL_TABLET | Freq: Four times a day (QID) | ORAL | Status: DC | PRN
  Administered 2022-03-18 – 2022-03-20 (×9): 50 mg via ORAL
  Filled 2022-03-18 (×9): qty 1

## 2022-03-18 MED ORDER — SODIUM CHLORIDE 0.9% IV SOLUTION: Freq: Once | INTRAVENOUS | Status: AC

## 2022-03-18 NOTE — Progress Notes (Addendum)
Rounding Note    Patient Name: Brandon Mason Date of Encounter: 03/18/2022  [place] Cardiologist: Sanda Klein, MD   Subjective   Patient resting comfortably in bed this morning. He denies chest pain, shortness of breath, orthopnea. Patient reports ambulating with assistance earlier this morning and states that he felt no chest pain with this exertion.  Inpatient Medications    Scheduled Meds:  finasteride  5 mg Oral Daily   pregabalin  150 mg Oral BID   rOPINIRole  2 mg Oral BID   Continuous Infusions:   ceFAZolin (ANCEF) IV 1 g (03/18/22 0515)   pantoprazole 8 mg/hr (03/18/22 0558)   PRN Meds: acetaminophen **OR** acetaminophen, ondansetron **OR** ondansetron (ZOFRAN) IV, traMADol   Vital Signs    Vitals:   03/17/22 1610 03/17/22 1633 03/17/22 2058 03/18/22 0459  BP: 128/72 139/64 121/62 119/68  Pulse: 83 89 87 86  Resp: '17 17 17 17  '$ Temp: 97.6 F (36.4 C) 97.9 F (36.6 C) 98.5 F (36.9 C) 98.3 F (36.8 C)  TempSrc:  Oral Oral Oral  SpO2: 96% 99% 95% 95%  Weight:      Height:        Intake/Output Summary (Last 24 hours) at 03/18/2022 0717 Last data filed at 03/18/2022 0500 Gross per 24 hour  Intake 200 ml  Output 1225 ml  Net -1025 ml      03/17/2022    3:12 PM 03/17/2022    8:10 AM 03/15/2021    9:32 AM  Last 3 Weights  Weight (lbs) 177 lb 177 lb 3.2 oz 178 lb  Weight (kg) 80.287 kg 80.377 kg 80.74 kg      Telemetry    Sinus rhythm - Personally Reviewed  ECG    RBBB with left anterior fascicular block and first degree AV block. No acute ischemic changes - Personally Reviewed  Physical Exam   GEN: No acute distress.   Neck: No JVD Cardiac: RRR, no murmurs, rubs, or gallops.  Respiratory: Clear to auscultation bilaterally. GI: Soft, nontender, non-distended  MS: RL extremity with erythema and swelling 2/2 cellulitis. LLE with trace edema, non-pitting. Neuro:  Nonfocal  Psych: Normal affect   Labs    High Sensitivity  Troponin:   Recent Labs  Lab 03/16/22 2024 03/16/22 2221 03/17/22 1241  TROPONINIHS 994* 1,132* 2,163*     Chemistry Recent Labs  Lab 03/16/22 2024 03/17/22 1241  NA 139 140  K 4.0 3.9  CL 109 109  CO2 24 25  GLUCOSE 132* 122*  BUN 60* 46*  CREATININE 1.09 0.97  CALCIUM 8.1* 8.2*  MG  --  2.1  PROT  --  5.7*  ALBUMIN  --  2.5*  AST  --  33  ALT  --  17  ALKPHOS  --  44  BILITOT  --  0.3  GFRNONAA >60 >60  ANIONGAP 6 6    Lipids  Recent Labs  Lab 03/17/22 0731  CHOL 90  TRIG 88  HDL 30*  LDLCALC 42  CHOLHDL 3.0    Hematology Recent Labs  Lab 03/16/22 2024 03/17/22 1241  WBC 18.0* 13.7*  RBC 1.65* 2.35*  HGB 5.5* 7.7*  HCT 17.5* 22.1*  MCV 106.1* 94.0  MCH 33.3 32.8  MCHC 31.4 34.8  RDW 14.5 19.5*  PLT 409* 361   Thyroid No results for input(s): "TSH", "FREET4" in the last 168 hours.  BNPNo results for input(s): "BNP", "PROBNP" in the last 168 hours.  DDimer No results  for input(s): "DDIMER" in the last 168 hours.   Radiology    DG Chest 2 View  Result Date: 03/16/2022 CLINICAL DATA:  Chest pain EXAM: CHEST - 2 VIEW COMPARISON:  03/08/2022 FINDINGS: Right basilar atelectasis. No focal airspace consolidation. No pleural effusion or pneumothorax. Mild cardiomegaly is unchanged. IMPRESSION: Right basilar atelectasis. Electronically Signed   By: Ulyses Jarred M.D.   On: 03/16/2022 20:41    Cardiac Studies   09/16/2019 TTE  IMPRESSIONS    1. Left ventricular ejection fraction, by estimation, is 60 to 65%. The  left ventricle has normal function. The left ventricle has no regional  wall motion abnormalities.   2. structure. Aortic valve regurgitation is not visualized. No aortic  stenosis is present.   3. There is moderate dilatation of the ascending aorta measuring 42 mm.   FINDINGS   Left Ventricle: Left ventricular ejection fraction, by estimation, is 60  to 65%. The left ventricle has normal function. The left ventricle has no  regional  wall motion abnormalities. The left ventricular internal cavity  size was normal in size. There is   no left ventricular hypertrophy. Left ventricular diastolic parameters  are indeterminate.   Right Ventricle: The right ventricular size is normal. No increase in  right ventricular wall thickness. Right ventricular systolic function is  normal. There is moderately elevated pulmonary artery systolic pressure.  The tricuspid regurgitant velocity is  2.97 m/s, and with an assumed right atrial pressure of 15 mmHg, the  estimated right ventricular systolic pressure is 83.4 mmHg.   Left Atrium: Left atrial size was normal in size.   Right Atrium: Right atrial size was normal in size.   Pericardium: There is no evidence of pericardial effusion.   Mitral Valve: The mitral valve is normal in structure. Normal mobility of  the mitral valve leaflets. No evidence of mitral valve regurgitation. No  evidence of mitral valve stenosis.   Tricuspid Valve: The tricuspid valve is normal in structure. Tricuspid  valve regurgitation is not demonstrated. No evidence of tricuspid  stenosis.   Aortic Valve: The aortic valve is normal in structure. Aortic valve  regurgitation is not visualized. No aortic stenosis is present.   Pulmonic Valve: The pulmonic valve was normal in structure. Pulmonic valve  regurgitation is not visualized. No evidence of pulmonic stenosis.   Aorta: The aortic root is normal in size and structure. There is moderate  dilatation of the ascending aorta measuring 42 mm.   Venous: The inferior vena cava is normal in size with greater than 50%  respiratory variability, suggesting right atrial pressure of 3 mmHg.   IAS/Shunts: No atrial le vel shunt detected by color flow Doppler.   03/08/2022 TTE from OSH   PROCEDURE A two-dimensional transthoracic echocardiogram with color flow and Doppler was performed. Image Quality: Technically difficult. - SUMMARY There is mild  concentric left ventricular hypertrophy. LV ejection fraction = 55-60%. There is trace aortic regurgitation. There is mild tricuspid regurgitation. Mild pulmonary hypertension. There is no pericardial effusion. Compared to the last study dated 11/16/2013, pulmonary pressure has increased slightly  - FINDINGS: LEFT VENTRICLE The left ventricular size is normal. There is mild concentric left ventricular hypertrophy. LV Global L Strain =-14.6%. Left ventricular systolic function is normal. LV ejection fraction = 55-60%. There is mild global hypokinesis of the left ventricle.  - RIGHT VENTRICLE The right ventricle is normal in size and function.  LEFT ATRIUM The left atrial size is normal.  RIGHT ATRIUM Right atrial  size is normal. - AORTIC VALVE There is trivial aortic valve thickening. There is no aortic stenosis. There is trace aortic regurgitation. - MITRAL VALVE The mitral valve is normal in structure and function. There is trace mitral regurgitation. - TRICUSPID VALVE There is trivial tricuspid valve thickening. There is mild tricuspid regurgitation. Mild pulmonary hypertension. - PULMONIC VALVE The pulmonic valve is not well visualized. There is no pulmonic valvular regurgitation. There is no pulmonic valvular stenosis. - ARTERIES The aortic sinus is normal size. Moderately dilated ascending aorta. - VENOUS IVC size was normal. - EFFUSION There is no pericardial effusion. There is no pleural effusion. No cardiac tamponade. - -  Patient Profile     Brandon Mason is a 88 y.o. male with a hx of hypertension, type 2 diabetes, RLS, BPH, OSA, CVA, DVT 2013 completed anticoagulation with Eliquis, cervical herniated nucleus pulposus with myelopathy s/p decompression C3/4 2017,  who is being seen 03/17/2022 for the evaluation of elevated troponin at the request of Dr. Lonny Prude.  Assessment & Plan    Elevated troponin, concern for NSTEMI Chest pain  Patient  reported symptoms of chest pain, shortness of breath, weakness/fatigue at time of admission. He was found with elevated troponin: 994, 1132, 2163. EKG with known RBBB, left anterior fascicular block, 1st degree AV block. No acute ischemic change.   Given conduction delays, would recommend against AV nodal blocking agents at this time.  Delta troponin now more concerning for atherothrombotic event vs demand myocardial infarction. Although patient has had improvement in HBG s/p transfusion (up to 7.7), he would still not be a candidate for invasive ischemic evaluation or revascularization at this time. Would recommend against ASA or at this time due to recent esophageal ulcers.  Could consider medically managing with heparin infusion x48 hours but at this time suspect that risks > benefits. Defer to Dr. Sallyanne Kuster for final decision on use of heparin.  Will trend troponin to peak. Patient continues to express a preference to avoid/defer invasive procedures given lack of symptoms. TTE ordered/scheduled by primary team. Given elevated troponin, reassessment of wall motion is reasonable. From a cardiology perspective, prefer HGB goal >8.  Chronic diastolic heart failure  TTE from [facility] shows LVEF 55-60% with mild global LV hypokinesis, trace AR, mild TR, mild pulmonary htn.Patient endorses chronic dyspnea on exertion, leg edema, is on HCTZ at home.   Patient remains euvolemic on exam. Could consider adding MRA for fluid management vs PRN lasix   Hypertension  BP now appears stable, can resume home lisinopril '20mg'$ .   Acute anemia with suspected GI bleed  HGB 5.5 to 7.7 after 2 unit pRBC transfusion. 9/17 upper endoscopy with esophageal ulcers with no bleeding and no stigmata of recent bleeding, gastritis. PPI BID x12 weeks per primary team.     Right leg cellulitis s/p wound I&D  Continue abx per pharmacy/primary team  OSA  Recommend nightly CPAP   For questions or updates, please  contact [place] Please consult [email or web address] for contact info under        Signed, Lukis Bunt, [name]  03/18/2022, 7:17 AM     I have seen and examined the patient along with Lily Kocher, [name] .  I have reviewed the chart, notes and new data.  I agree with PA/NP's note.  Key new complaints: He feels well.  He has not had any angina pectoris since his transfusion. Key examination changes: Hemodynamically compensated.  Normal CV exam Key new findings / data:  EGD showed 3 esophageal ulcers, none with active bleeding.  PLAN: I think we can acknowledge that he has underlying CAD, but there is no evidence of true acute coronary syndrome.  His symptoms were due to severe anemia and demand infarction.  At his age he prefers noninvasive evaluation and treatment. Start low dose furosemide for edema. Lipid-lowering does not appear to be necessary as LDL 42 on current labs), beta-blockers.  Would hold off aspirin for another few weeks until his esophageal ulcers and gastritis heal.  Avoid NSAIDs.  We will make arrangements for follow-up in clinic.    Sanda Klein, MD, [place] [phone] 03/18/2022, 2:59 PM

## 2022-03-18 NOTE — Progress Notes (Signed)
PROGRESS NOTE    Brandon Mason  JHE:[id number] DOB: 1925-11-16 DOA: 03/16/2022 PCP: Clovia Cuff, MD   Brief Narrative: Brandon Mason is a 88 y.o. male with a history of OSA, chronic pain, restless legs, hypertension, recent I&D s/p wound vac. Patient presented secondary to report of black stools and found to have severe anemia (hemoglobin of 5.5)  with associated elevated troponin. GI and cardiology consulted. Protonix drip started and 2 units of PRBC transfused. Upper endoscopy performed on 9/17 and was significant for esophageal ulcers without stigmata of recent bleeding, in addition to gastritis. PPI recommended.   Assessment and Plan: * GI bleeding Patient with a history of melena. Associated severe anemia. GI consulted. Protonix drip initiated. EGD performed on 9/17 and was significant for gastritis in addition to esophageal ulcers, however there was no stigmata of recent bleeding. No recurrent melena.  Open wound of right thigh Recent I&D of infected hematoma and wound vac placement. Patient discharged on doxycycline with end date of 9/23. Wound vac recommendations for dressing changes every Monday, Wednesday and Friday. Leukocytosis on admission without fever and without obvious evidence of infection. Blood cultures obtained on admission. Ceftriaxone IV x1 received on admission. Leukocytosis resolved. Blood cultures with no growth. -Cefazolin IV while inpatient -Follow-up blood cultures -Wound consult for wound vac management  NSTEMI (non-ST elevated myocardial infarction) (HCC) Troponin of 994 > 1,132 > 2,163 > 1,128. Associated chest pressure and in setting of acute severe anemia. No ischemic changes on EKG. Cardiology consulted on admission and recommended no heparin IV secondary to GI bleeding and severe anemia. Transthoracic Echocardiogram ordered and pending -Cardiology recommendations: following up Transthoracic Echocardiogram results -Follow-up Transthoracic  Echocardiogram  Anemia due to GI blood loss Patient with acute on chronic anemia. Recent hemoglobin of 10.7 from 9/11. Acute anemia secondary to melena and presumed GI bleeding. Hemoglobin of 5.5 on admission. Two units of PRBC ordered and transfused with post transfusion hemoglobin up to 7.7. Hemoglobin down to 6.9 today -Transfuse 1 unit of PRBC; post-transfusion hemoglobin -CBC in AM  Chronic pain syndrome -Continue Tramadol PRN  Essential hypertension Patient is on amlodipine, hydrochlorothiazide and lisinopril as an outpatient, which were held secondary to acute anemia and concern for precipitating hypotension.  Nocturnal hypoxemia -Continue nocturnal oxyen  OSA on CPAP -Continue CPAP  Restless legs syndrome (RLS) -Continue Lyrica, Requip and Tramadol prn  Gastritis See problem, Esophageal ulcer  Esophageal ulcer Presumed source of GI bleeding. Diagnosed on EGD (9/18). GI recommending PPI BID for 12 weeks and repeat EGD to assess for healing of ulcers -Discontinue Protonix drip and start Protonix 40 mg BID x12 weeks -No NSAIDs -Outpatient GI follow-up  DNR (do not resuscitate)/DNI(Do Not Intubate) Verified on admission.    DVT prophylaxis: SCDs Code Status:   Code Status: DNR Family Communication: Daughter at bedside Disposition Plan: Discharge pending GI/Cardiology recommendations/management   Consultants:  [facility]  Procedures:  Upper endoscopy (9/17)  Antimicrobials: Ceftriaxone Cefazolin    Subjective: Patient reports no bloody stools overnight, in addition to no bowel movement at all.  Objective: BP (!) 103/53 (BP Location: Left Arm)   Pulse 86   Temp 98.2 F (36.8 C) (Oral)   Resp 18   Ht '5\' 8"'$  (1.727 m)   Wt 80.3 kg   SpO2 97%   BMI 26.91 kg/m   Examination:  General exam: Appears in pain from spasm Respiratory system: Clear to auscultation. Respiratory effort normal. Cardiovascular system: S1 & S2  heard, RRR.  No murmurs, rubs, gallops or clicks. Gastrointestinal system: Abdomen is nondistended, soft and nontender. No organomegaly or masses felt. Normal bowel sounds heard. Central nervous system: Alert and oriented. No focal neurological deficits. Musculoskeletal: BLE edema. No calf tenderness Skin: RLE dermatitis Psychiatry: Judgement and insight appear normal. Mood & affect appropriate.    Data Reviewed: I have personally reviewed following labs and imaging studies  CBC Lab Results  Component Value Date   WBC 10.5 03/18/2022   RBC 2.08 (L) 03/18/2022   HGB 6.9 (LL) 03/18/2022   HCT 20.6 (L) 03/18/2022   MCV 99.0 03/18/2022   MCH 33.2 03/18/2022   PLT 357 03/18/2022   MCHC 33.5 03/18/2022   RDW 18.9 (H) 03/18/2022   LYMPHSABS 1.1 03/17/2022   MONOABS 0.5 03/17/2022   EOSABS 0.1 03/17/2022   BASOSABS 0.4 (H) 65/46/5035     Last metabolic panel Lab Results  Component Value Date   NA 144 03/18/2022   K 3.9 03/18/2022   CL 111 03/18/2022   CO2 24 03/18/2022   BUN 33 (H) 03/18/2022   CREATININE 1.04 03/18/2022   GLUCOSE 115 (H) 03/18/2022   GFRNONAA >60 03/18/2022   GFRAA >60 01/20/2018   CALCIUM 8.3 (L) 03/18/2022   PROT 5.7 (L) 03/17/2022   ALBUMIN 2.5 (L) 03/17/2022   LABGLOB 2.9 05/14/2013   AGRATIO 1.4 05/14/2013   BILITOT 0.3 03/17/2022   ALKPHOS 44 03/17/2022   AST 33 03/17/2022   ALT 17 03/17/2022   ANIONGAP 9 03/18/2022    GFR: Estimated Creatinine Clearance: 42.9 mL/min (by C-G formula based on SCr of 1.04 mg/dL).  Recent Results (from the past 240 hour(s))  SARS Coronavirus 2 by RT PCR (hospital order, performed in [facility] hospital lab) *cepheid single result test* Anterior Nasal Swab     Status: None   Collection Time: 03/16/22 10:20 PM   Specimen: Anterior Nasal Swab  Result Value Ref Range Status   SARS Coronavirus 2 by RT PCR NEGATIVE NEGATIVE Final    Comment: (NOTE) SARS-CoV-2 target nucleic acids are NOT DETECTED.  The  SARS-CoV-2 RNA is generally detectable in upper and lower respiratory specimens during the acute phase of infection. The lowest concentration of SARS-CoV-2 viral copies this assay can detect is 250 copies / mL. A negative result does not preclude SARS-CoV-2 infection and should not be used as the sole basis for treatment or other patient management decisions.  A negative result may occur with improper specimen collection / handling, submission of specimen other than nasopharyngeal swab, presence of viral mutation(s) within the areas targeted by this assay, and inadequate number of viral copies (<250 copies / mL). A negative result must be combined with clinical observations, patient history, and epidemiological information.  Fact Sheet for Patients:   https://www.patel.info/  Fact Sheet for Healthcare Providers: https://hall.com/  This test is not yet approved or  cleared by the [place] FDA and has been authorized for detection and/or diagnosis of SARS-CoV-2 by FDA under an Emergency Use Authorization (EUA).  This EUA will remain in effect (meaning this test can be used) for the duration of the COVID-19 declaration under Section 564(b)(1) of the Act, 21 U.S.C. section 360bbb-3(b)(1), unless the authorization is terminated or revoked sooner.  Performed at [facility], [place] [address], [place], [place] [address]   Culture, blood (routine x 2)     Status: None (Preliminary result)   Collection Time: 03/16/22 10:21 PM   Specimen: BLOOD  Result Value Ref Range Status  Specimen Description BLOOD RIGHT ANTECUBITAL  Final   Special Requests   Final    BOTTLES DRAWN AEROBIC AND ANAEROBIC Blood Culture results may not be optimal due to an excessive volume of blood received in culture bottles   Culture   Final    NO GROWTH 2 DAYS Performed at [facility], [place] [address], [place], [place] [address]    Report Status PENDING   Incomplete  Culture, blood (routine x 2)     Status: None (Preliminary result)   Collection Time: 03/17/22  7:32 AM   Specimen: BLOOD  Result Value Ref Range Status   Specimen Description BLOOD RIGHT ANTECUBITAL  Final   Special Requests   Final    BOTTLES DRAWN AEROBIC AND ANAEROBIC Blood Culture adequate volume   Culture   Final    NO GROWTH < 24 HOURS Performed at [facility], [place] [address], [place], [place] [address]    Report Status PENDING  Incomplete      Radiology Studies: DG Chest 2 View  Result Date: 03/16/2022 CLINICAL DATA:  Chest pain EXAM: CHEST - 2 VIEW COMPARISON:  03/08/2022 FINDINGS: Right basilar atelectasis. No focal airspace consolidation. No pleural effusion or pneumothorax. Mild cardiomegaly is unchanged. IMPRESSION: Right basilar atelectasis. Electronically Signed   By: Ulyses Jarred M.D.   On: 03/16/2022 20:41      LOS: 2 days    Cordelia Poche, MD [facility] 03/18/2022, 12:18 PM   If 7PM-7AM, please contact night-coverage [email or web address]

## 2022-03-18 NOTE — Progress Notes (Signed)
Pt refusing cpap for the night. ?

## 2022-03-18 NOTE — Telephone Encounter (Signed)
Patient has been scheduled for a 61-monthfollow up appt with Dr. DLoletha Carrowon Tuesday, 04/30/22 at 1:40 pm. Appt information will be included on hospital discharge papers. I also sent the appt information in the mail and via [place]

## 2022-03-18 NOTE — Evaluation (Signed)
Occupational Therapy Evaluation Patient Details Name: Brandon Mason MRN: [id number] DOB: 09-29-1925 Today's Date: 03/18/2022   History of Present Illness Patient is a 88 y/o male who presents on 03/16/22 with chest pain, weakness and black stools for a few days. Admitted with GI bleed, NSTEMI and recent I&D of thigh abscess at [facility]. PMH includes CKD, HTN, CVA, RLS, sleep apnea.   Clinical Impression   Prior to this admission, patient lived with his daughter, independent in ADLs and driving (not since wound vac placement) and daughter would assist with IADLs. Currently, patient is min A for ADLs, and min guard for transfers and ambulation. OT endorsing continuation of HHOT set up established post discharge from I&D. OT will continue to follow acutely.      Recommendations for follow up therapy are one component of a multi-disciplinary discharge planning process, led by the attending physician.  Recommendations may be updated based on patient status, additional functional criteria and insurance authorization.   Follow Up Recommendations  Home health OT    Assistance Recommended at Discharge Set up Supervision/Assistance  Patient can return home with the following A lot of help with bathing/dressing/bathroom;Assist for transportation;Help with stairs or ramp for entrance    Functional Status Assessment  Patient has had a recent decline in their functional status and demonstrates the ability to make significant improvements in function in a reasonable and predictable amount of time.  Equipment Recommendations  None recommended by OT (Patient has DME needed)    Recommendations for Other Services       Precautions / Restrictions Precautions Precautions: Other (comment);Fall Precaution Comments: restless legs and wound vac Restrictions Weight Bearing Restrictions: No      Mobility Bed Mobility               General bed mobility comments: up in chair upon arrival     Transfers Overall transfer level: Needs assistance Equipment used: None Transfers: Sit to/from Stand Sit to Stand: Min guard           General transfer comment: Min guard for safety. Stood from [employer], returned to chair post ambulation.      Balance Overall balance assessment: Needs assistance Sitting-balance support: Feet supported, No upper extremity supported Sitting balance-Leahy Scale: Good     Standing balance support: During functional activity Standing balance-Leahy Scale: Fair                             ADL either performed or assessed with clinical judgement   ADL Overall ADL's : Needs assistance/impaired Eating/Feeding: Set up;Sitting   Grooming: Set up;Sitting   Upper Body Bathing: Minimal assistance;Sitting   Lower Body Bathing: Moderate assistance;Sitting/lateral leans;Sit to/from stand   Upper Body Dressing : Minimal assistance;Sitting   Lower Body Dressing: Maximal assistance;Moderate assistance;Sit to/from stand;Sitting/lateral leans   Toilet Transfer: Min guard;Ambulation   Toileting- Clothing Manipulation and Hygiene: Min guard;Sit to/from stand;Sitting/lateral lean       Functional mobility during ADLs: Minimal assistance General ADL Comments: Patient presenting with pain at wound vac site, decreased activity tolerance, and need for increased assist with ADLs     Vision Baseline Vision/History: 1 Wears glasses (Readers) Ability to See in Adequate Light: 0 Adequate Patient Visual Report: No change from baseline       Perception     Praxis      Pertinent Vitals/Pain Pain Assessment Pain Assessment: Faces Faces Pain Scale: Hurts a little bit Pain  Location: wound vac placement Pain Descriptors / Indicators: Sore, Aching Pain Intervention(s): Limited activity within patient's tolerance, Monitored during session, Repositioned     Hand Dominance Right   Extremity/Trunk Assessment Upper Extremity Assessment Upper  Extremity Assessment: Generalized weakness   Lower Extremity Assessment Lower Extremity Assessment: Defer to PT evaluation RLE Deficits / Details: wound vac in thigh due to recent abscess and I&D at [place]   Cervical / Trunk Assessment Cervical / Trunk Assessment: Kyphotic   Communication Communication Communication: No difficulties   Cognition Arousal/Alertness: Awake/alert Behavior During Therapy: WFL for tasks assessed/performed Overall Cognitive Status: Within Functional Limits for tasks assessed                                       General Comments       Exercises     Shoulder Instructions      Home Living Family/patient expects to be discharged to:: Private residence Living Arrangements: Children (daughter) Available Help at Discharge: Family;Available PRN/intermittently Type of Home: House Home Access: Stairs to enter Entrance Stairs-Number of Steps: 2 vs ramp in garage Entrance Stairs-Rails: Right Home Layout: One level     Bathroom Shower/Tub: Occupational psychologist: Handicapped height     Home Equipment: Conservation officer, nature (2 wheels);Shower seat          Prior Functioning/Environment Prior Level of Function : Independent/Modified Independent             Mobility Comments: independent, drives. Daughter does IADLs. ADLs Comments: independent        OT Problem List: Decreased strength;Decreased range of motion;Decreased activity tolerance;Impaired balance (sitting and/or standing);Increased edema      OT Treatment/Interventions: Self-care/ADL training;Therapeutic exercise;Energy conservation;DME and/or AE instruction;Patient/family education;Balance training;Therapeutic activities    OT Goals(Current goals can be found in the care plan section) Acute Rehab OT Goals Patient Stated Goal: to get better and go home OT Goal Formulation: With patient/family Time For Goal Achievement: 04/01/22 Potential to Achieve Goals:  Good ADL Goals Pt Will Perform Lower Body Bathing: Independently;with adaptive equipment;sitting/lateral leans;sit to/from stand Pt Will Perform Lower Body Dressing: Independently;with adaptive equipment;sit to/from stand;sitting/lateral leans Pt Will Transfer to Toilet: Independently;ambulating Pt Will Perform Toileting - Clothing Manipulation and hygiene: Independently Pt/caregiver will Perform Home Exercise Program: Increased ROM;Increased strength;With written HEP provided;With Supervision Additional ADL Goal #1: Patient will demonstrate increased activity tolerance by completing functional ADL/IADL task in standing for 3-5 minutes prior to needing seated rest break.  OT Frequency: Min 2X/week    Co-evaluation              AM-PAC OT "6 Clicks" Daily Activity     Outcome Measure Help from another person eating meals?: A Little Help from another person taking care of personal grooming?: A Little Help from another person toileting, which includes using toliet, bedpan, or urinal?: A Little Help from another person bathing (including washing, rinsing, drying)?: A Lot Help from another person to put on and taking off regular upper body clothing?: A Little Help from another person to put on and taking off regular lower body clothing?: A Lot 6 Click Score: 16   End of Session Nurse Communication: Mobility status  Activity Tolerance: Patient tolerated treatment well Patient left: in chair;with call bell/phone within reach;with nursing/sitter in room;with family/visitor present  OT Visit Diagnosis: Unsteadiness on feet (R26.81);Muscle weakness (generalized) (M62.81)  Time: 4827-0786 OT Time Calculation (min): 14 min Charges:  OT General Charges $OT Visit: 1 Visit OT Evaluation $OT Eval Moderate Complexity: 1 Mod  Corinne Ports E. Dorann Davidson, OTR/L Acute Rehabilitation Services [phone]   Ascencion Dike 03/18/2022, 2:04 PM

## 2022-03-18 NOTE — Assessment & Plan Note (Addendum)
Presumed source of GI bleeding. Diagnosed on EGD (9/18). GI recommending PPI BID for 12 weeks and repeat EGD to assess for healing of ulcers -Discontinue Protonix drip and start Protonix 40 mg BID x12 weeks -No NSAIDs -Outpatient GI follow-up

## 2022-03-18 NOTE — Assessment & Plan Note (Signed)
See problem, Esophageal ulcer

## 2022-03-18 NOTE — Telephone Encounter (Signed)
-----   Message from Sharyn Creamer, MD sent at 03/17/2022  3:52 PM EDT ----- [facility], please arrange for GI clinic follow up with Dr. Loletha Carrow or APP in 1-2 months for esophageal ulcers and anemia. Thanks.

## 2022-03-18 NOTE — Evaluation (Signed)
Physical Therapy Evaluation Patient Details Name: Brandon Mason MRN: [id number] DOB: [birth year] Today's Date: 03/18/2022  History of Present Illness  Patient is a 88 y/o male who presents on 03/16/22 with chest pain, weakness and black stools for a few days. Admitted with GI bleed, NSTEMI and recent I&D of thigh abscess at [facility]. PMH includes CKD, HTN, CVA, RLS, sleep apnea.  Clinical Impression  Patient presents with restless leg syndrome pain, generalized weakness and impaired mobility s/p above. Pt lives at home with his daughter who works and reports being independent for ADLs and ambulation PTA. Today, pt tolerated transfers and gait with Min guard for safety due to mild unsteadiness and SOB. Balance and endurance might improve with use of RW for support. Will consult mobility tech to increase activity while in the hospital. Will follow acutely to maximize independence and mobility prior to return home.       Recommendations for follow up therapy are one component of a multi-disciplinary discharge planning process, led by the attending physician.  Recommendations may be updated based on patient status, additional functional criteria and insurance authorization.  Follow Up Recommendations No PT follow up      Assistance Recommended at Discharge Intermittent Supervision/Assistance  Patient can return home with the following  A little help with walking and/or transfers;A little help with bathing/dressing/bathroom;Help with stairs or ramp for entrance;Assistance with cooking/housework    Equipment Recommendations None recommended by PT  Recommendations for Other Services       Functional Status Assessment Patient has had a recent decline in their functional status and demonstrates the ability to make significant improvements in function in a reasonable and predictable amount of time.     Precautions / Restrictions Precautions Precautions: Other (comment);Fall Precaution Comments:  restless legs Restrictions Weight Bearing Restrictions: No      Mobility  Bed Mobility Overal bed mobility: Needs Assistance Bed Mobility: Supine to Sit     Supine to sit: Supervision, HOB elevated     General bed mobility comments: HOB mostly flat, no assist needed. No dizziness.    Transfers Overall transfer level: Needs assistance Equipment used: None Transfers: Sit to/from Stand Sit to Stand: Min guard           General transfer comment: Min guard for safety. Stood from [employer], transferred to chair post ambulation.    Ambulation/Gait Ambulation/Gait assistance: Min guard Gait Distance (Feet): 200 Feet Assistive device: None Gait Pattern/deviations: Step-through pattern, Decreased stride length, Wide base of support Gait velocity: decreased Gait velocity interpretation: <1.31 ft/sec, indicative of household ambulator   General Gait Details: Slow, mildly unsteady gait with close Min guard for safety. 2/4 DOE. Wide BoS for balance. "that was pretty good without the RW"  Stairs            Wheelchair Mobility    Modified Rankin (Stroke Patients Only)       Balance Overall balance assessment: Needs assistance Sitting-balance support: Feet supported, No upper extremity supported Sitting balance-Leahy Scale: Good     Standing balance support: During functional activity Standing balance-Leahy Scale: Fair                               Pertinent Vitals/Pain Pain Assessment Pain Assessment: Faces Faces Pain Scale: Hurts even more Pain Location: restless legs Pain Descriptors / Indicators: Sore, Aching Pain Intervention(s): Monitored during session, Repositioned, Patient requesting pain meds-RN notified    Home Living  Family/patient expects to be discharged to:: Private residence Living Arrangements: Children (daughter) Available Help at Discharge: Family;Available PRN/intermittently Type of Home: House Home Access: Stairs to  enter Entrance Stairs-Rails: Right Entrance Stairs-Number of Steps: 2 vs ramp in garage   Home Layout: One level Home Equipment: Conservation officer, nature (2 wheels);Shower seat - built in      Prior Function Prior Level of Function : Independent/Modified Independent             Mobility Comments: independent, drives. Daughter does IADLs. ADLs Comments: independent     Hand Dominance   Dominant Hand: Right    Extremity/Trunk Assessment   Upper Extremity Assessment Upper Extremity Assessment: Defer to OT evaluation    Lower Extremity Assessment Lower Extremity Assessment: Generalized weakness RLE Deficits / Details: wound vac in thigh due to recent abscess and I&D at [place]    Cervical / Trunk Assessment Cervical / Trunk Assessment: Kyphotic  Communication   Communication: No difficulties  Cognition Arousal/Alertness: Awake/alert Behavior During Therapy: WFL for tasks assessed/performed Overall Cognitive Status: Within Functional Limits for tasks assessed                                          General Comments      Exercises     Assessment/Plan    PT Assessment Patient needs continued PT services  PT Problem List Decreased strength;Decreased mobility;Decreased balance;Pain;Decreased activity tolerance       PT Treatment Interventions Therapeutic activities;Gait training;Therapeutic exercise;Patient/family education;Balance training;Functional mobility training;Stair training;DME instruction    PT Goals (Current goals can be found in the Care Plan section)  Acute Rehab PT Goals Patient Stated Goal: to improve pain in BLEs, go home PT Goal Formulation: With patient Time For Goal Achievement: 04/01/22 Potential to Achieve Goals: Good    Frequency Min 3X/week     Co-evaluation               AM-PAC PT "6 Clicks" Mobility  Outcome Measure Help needed turning from your back to your side while in a flat bed without using bedrails?: A  Little Help needed moving from lying on your back to sitting on the side of a flat bed without using bedrails?: A Little Help needed moving to and from a bed to a chair (including a wheelchair)?: A Little Help needed standing up from a chair using your arms (e.g., wheelchair or bedside chair)?: A Little Help needed to walk in hospital room?: A Little Help needed climbing 3-5 steps with a railing? : A Little 6 Click Score: 18    End of Session Equipment Utilized During Treatment: Gait belt Activity Tolerance: Patient tolerated treatment well Patient left: in chair;with call bell/phone within reach;with chair alarm set Nurse Communication: Mobility status PT Visit Diagnosis: Muscle weakness (generalized) (M62.81);Difficulty in walking, not elsewhere classified (R26.2);Unsteadiness on feet (R26.81)    Time: 8938-1017 PT Time Calculation (min) (ACUTE ONLY): 18 min   Charges:   PT Evaluation $PT Eval Moderate Complexity: 1 Mod          Marisa Severin, PT, DPT Acute Rehabilitation Services Secure chat preferred Office [phone]     Marguarite Arbour A [place] 03/18/2022, 8:47 AM

## 2022-03-18 NOTE — TOC Initial Note (Signed)
Transition of Care [facility]) - Initial/Assessment Note    Patient Details  Name: Brandon Mason MRN: [id number] Date of Birth: 1925/10/06  Transition of [facility]) CM/SW Contact:    Marilu Favre, RN Phone Number: 03/18/2022, 12:45 PM  Clinical Narrative:                 Spoke to patient and daughter Mariel Gaudin at bedside. Confirmed face sheet information.   Patient from home with daughter. Active with [facility] for HHRN,PT,OT, has home VAC with [employer], and home oxygen with [employer].   Patient states he wears home oxygen as needed "occasionally".   Olivia Mackie with [employer] aware of patient's admission.   Kelly with CMinongaware of patient's admission and will need resumption of care orders for HHPT/OT/RN. NCM entered for MD signature   Expected Discharge Plan: [place] Valley[place]riers to Discharge: Continued Medical Work up   Patient Goals and CMS Choice Patient states their goals for this hospitalization and ongoing recovery are:: to return to home CMS Medicare.gov Compare Post Acute Care list provided to:: Patient Choice offered to / list presented to : Patient, Spouse  Expected Discharge Plan and Services Expected Discharge Plan: [place] BeachAcute Care Choice: HKaylorarrangements for the past 2 months: Single Family Home                 DME Arranged: N/A DME Agency: NA       HH Arranged: RN, PT, OT HH Agency: [place] 03/18/22 Time [place] 1244 Representative spoke with at [place] Prior Living Arrangements/Services Living arrangements for the past 2 months: [place]  Patient language and need for interpreter reviewed:: Yes Do you feel safe going back to the place where you live?: Yes      Need for Family Participation in Patient Care: Yes (Comment) Care giver support system in place?: Yes (comment)   Criminal Activity/Legal  Involvement Pertinent to Current Situation/Hospitalization: No - Comment as needed  Activities of Daily Living Home Assistive Devices/Equipment: None ADL Screening (condition at time of admission) Patient's cognitive ability adequate to safely complete daily activities?: Yes Is the patient deaf or have difficulty hearing?: No Does the patient have difficulty seeing, even when wearing glasses/contacts?: No Does the patient have difficulty concentrating, remembering, or making decisions?: No Patient able to express need for assistance with ADLs?: Yes Does the patient have difficulty dressing or bathing?: No Independently performs ADLs?: Yes (appropriate for developmental age) Does the patient have difficulty walking or climbing stairs?: No Weakness of Legs: None Weakness of Arms/Hands: None  Permission Sought/Granted   Permission granted to share information with : Yes, Verbal Permission Granted  Share Information with NAME: daughter TRoczen Waymire Permission granted to share info w AGENCY: [facility], [facility]        Emotional Assessment Appearance:: Appears stated age Attitude/Demeanor/Rapport: Engaged Affect (typically observed): Accepting Orientation: : Oriented to Situation, Oriented to  Time, Oriented to Place, Oriented to Self Alcohol / Substance Use: Not Applicable Psych Involvement: No (comment)  Admission diagnosis:  GI bleeding [K92.2] Elevated troponin [R77.8] Cellulitis of right lower extremity [L03.115] Gastrointestinal hemorrhage with melena [K92.1] Anemia, unspecified type [D64.9] Patient Active Problem List   Diagnosis Date Noted   Esophageal ulcer 03/18/2022   Gastritis 03/18/2022   GI bleeding 03/16/2022   Anemia due to GI blood loss 03/16/2022  NSTEMI (non-ST elevated myocardial infarction) ([place]) 03/16/2022   Open wound of right thigh 03/16/2022   Essential hypertension 03/16/2022   DNR (do not resuscitate)/DNI(Do Not Intubate) 03/16/2022   Chronic pain  syndrome 05/15/2021   Nocturnal hypoxemia 03/13/2020   Peripheral edema 03/13/2020   OSA on CPAP 11/30/2019   Spinal stenosis of lumbar region without neurogenic claudication 11/09/2019   BPH with obstruction/lower urinary tract symptoms 09/29/2019   Sciatica of left side 01/18/2019   HNP (herniated nucleus pulposus) with myelopathy, cervical 03/29/2016   UARS (upper airway resistance syndrome) 06/28/2014   Restless leg syndrome, familial, uncontrolled 08/20/2013   Restless legs syndrome with nocturnal myoclonus    Abnormality of gait 02/09/2013   Restless legs syndrome (RLS) 02/09/2013   PCP:  Clovia Cuff, MD Pharmacy:   [place], [place] - [address] [address] Phone: [phone] Fax: [phone]  [facility] ([place], [place] [facility] [address] [place] [address] Phone: [phone] Fax: [phone]  [facility] ([facility]) - [place], [place] [address] Phone: [phone] Fax: [phone]  [id number] [place], [place] - [id number] [id number]: [phone]: [phone]    Social Determinants of Health (SDOH) Interventions    Readmission Risk Interventions     No data to display

## 2022-03-18 NOTE — Progress Notes (Signed)
Mobility Specialist Criteria Algorithm Info.   03/18/22 1128  Mobility  Activity Ambulated with assistance in hallway  Range of Motion/Exercises Active;All extremities  Level of Assistance Standby assist, set-up cues, supervision of patient - no hands on  Assistive Device None  Distance Ambulated (ft) 550 ft  Activity Response Tolerated well   Patient received in recliner agreeable to participate in mobility. Ambulated supervision level with fast gait. Returned to room without complaint or incident. Was left in recliner with all needs met, call bell in reach.   [name] Denver Harder, [place], [place]  WNWNE:[phone] Office: [phone]

## 2022-03-18 NOTE — Consult Note (Addendum)
[facility] Nurse Consult Note: Reason for Consult: Right medial thigh hematoma evacuation site with NPWT. [place] Nursing is requested for placement of hospital V.A.C.  Patient's Acti-VAC is disconnected and returned to its carrying bag. This is placed on the counter in the patient's room along with the [facility] charging cord.   Wound type: Routine NPWT dressing change Pressure Injury POA: NA Measurement:6cm x 2cm x 0.5cm Wound bed: pink, moist Drainage (amount, consistency, odor) scant serous Periwound: intact Dressing procedure/placement/frequency: NPWT dressing removed including 1 piece of black foam. Wound cleansed with NS, patted dry. Periwound skin protected with drape, one piece of black foam placed into wound and dressing covered with drape. Dressing attached to 112mHg continuous negative pressure and an immediate seal is achieved. Patient tolerated procedure well.  Bedside RN to perform subsequent dressing changes for this uncomplicated wound. [place].  A black foam dressing kit (Kellie Simmering3 [id number] is in the room for that change.  WFirebaughnursing team will not follow, but will remain available to this patient, the nursing and medical teams.  Please re-consult if needed.  Thank you for inviting u[place]ato participate in this patient's Plan of Care.  LMaudie Flakes MSN, RN, CNS, GCentre[place] CSerita Grammes [employer] FUnisys Corporationphone:  [phone]

## 2022-03-19 ENCOUNTER — Telehealth: Payer: Self-pay | Admitting: Internal Medicine

## 2022-03-19 DIAGNOSIS — D649 Anemia, unspecified: Secondary | ICD-10-CM | POA: Diagnosis not present

## 2022-03-19 DIAGNOSIS — K922 Gastrointestinal hemorrhage, unspecified: Secondary | ICD-10-CM | POA: Diagnosis not present

## 2022-03-19 DIAGNOSIS — I10 Essential (primary) hypertension: Secondary | ICD-10-CM | POA: Diagnosis not present

## 2022-03-19 DIAGNOSIS — G894 Chronic pain syndrome: Secondary | ICD-10-CM | POA: Diagnosis not present

## 2022-03-19 LAB — TYPE AND SCREEN
ABO/RH(D): A POS
Antibody Screen: NEGATIVE
Unit division: 0
Unit division: 0
Unit division: 0

## 2022-03-19 LAB — BPAM RBC
Blood Product Expiration Date: 202309232359
Blood Product Expiration Date: 202310122359
Blood Product Expiration Date: 202310122359
ISSUE DATE / TIME: 202309170024
ISSUE DATE / TIME: 202309170246
ISSUE DATE / TIME: 202309181305
Unit Type and Rh: 6200
Unit Type and Rh: 6200
Unit Type and Rh: 6200

## 2022-03-19 LAB — CBC
HCT: 24 % — ABNORMAL LOW (ref 39.0–52.0)
Hemoglobin: 7.9 g/dL — ABNORMAL LOW (ref 13.0–17.0)
MCH: 32.1 pg (ref 26.0–34.0)
MCHC: 32.9 g/dL (ref 30.0–36.0)
MCV: 97.6 fL (ref 80.0–100.0)
Platelets: 381 10*3/uL (ref 150–400)
RBC: 2.46 MIL/uL — ABNORMAL LOW (ref 4.22–5.81)
RDW: 18.6 % — ABNORMAL HIGH (ref 11.5–15.5)
WBC: 11.9 10*3/uL — ABNORMAL HIGH (ref 4.0–10.5)
nRBC: 0.2 % (ref 0.0–0.2)

## 2022-03-19 LAB — HEMOGLOBIN AND HEMATOCRIT, BLOOD
HCT: 24.1 % — ABNORMAL LOW (ref 39.0–52.0)
Hemoglobin: 7.9 g/dL — ABNORMAL LOW (ref 13.0–17.0)

## 2022-03-19 MED ORDER — POLYETHYLENE GLYCOL 3350 17 G PO PACK
17.0000 g | PACK | Freq: Every day | ORAL | Status: DC
  Administered 2022-03-19 – 2022-03-20 (×2): 17 g via ORAL
  Filled 2022-03-19 (×2): qty 1

## 2022-03-19 NOTE — Care Management Important Message (Signed)
Important Message  Patient Details  Name: Brandon Mason MRN: [id number] Date of Birth: 21-May-1926   Medicare Important Message Given:  Yes     Hannah Beat 03/19/2022, 12:04 PM

## 2022-03-19 NOTE — Progress Notes (Signed)
Pt refusing cpap for the night. ?

## 2022-03-19 NOTE — Progress Notes (Signed)
Please change order for Tramadol to reflect how it is administered per home prescription. Patient is able to take Tramadol more frequently at home please refer to home med list.

## 2022-03-19 NOTE — Progress Notes (Signed)
Mobility Specialist - Progress Note   03/19/22 1614  Mobility  Activity Transferred from bed to chair  Level of Assistance Standby assist, set-up cues, supervision of patient - no hands on  Assistive Device None  Distance Ambulated (ft) 5 ft  Activity Response Tolerated well  $Mobility charge 1 Mobility    Pt received in bed agreeable to mobility. Left in recliner w/ call bell in reach and all needs met.   Paulla Dolly Mobility Specialist

## 2022-03-19 NOTE — Telephone Encounter (Signed)
Caller : Brandon Mason Call back number: [phone]   Verbal orders  Once a week 8 weeks

## 2022-03-19 NOTE — Progress Notes (Signed)
Mobility Specialist - Progress Note   03/19/22 1209  Mobility  Activity Ambulated independently in hallway  Level of Assistance Modified independent, requires aide device or extra time  Assistive Device Front wheel walker  Distance Ambulated (ft) 550 ft  Activity Response Tolerated well  $Mobility charge 1 Mobility    Pt ambulating independently in hallway. No physical assistance needed.   Paulla Dolly Mobility Specialist

## 2022-03-19 NOTE — Progress Notes (Signed)
PROGRESS NOTE    Brandon Mason  EUM:[id number] DOB: [birth year] DOA: 03/16/2022 PCP: Clovia Cuff, MD   Brief Narrative: Brandon Mason is a 88 y.o. male with a history of OSA, chronic pain, restless legs, hypertension, recent I&D s/p wound vac. Patient presented secondary to report of black stools and found to have severe anemia (hemoglobin of 5.5)  with associated elevated troponin. GI and cardiology consulted. Protonix drip started and 2 units of PRBC transfused. Upper endoscopy performed on 9/17 and was significant for esophageal ulcers without stigmata of recent bleeding, in addition to gastritis. PPI recommended.   Assessment and Plan: * GI bleeding Patient with a history of melena. Associated severe anemia. GI consulted. Protonix drip initiated. EGD performed on 9/17 and was significant for gastritis in addition to esophageal ulcers, however there was no stigmata of recent bleeding. Patient with an episode of melanotic stool yesterday, 9/18 which may be residual from prior bleeding. -Continue serial hemoglobin checks  Open wound of right thigh Recent I&D of infected hematoma and wound vac placement. Patient discharged on doxycycline with end date of 9/23. Wound vac recommendations for dressing changes every Monday, Wednesday and Friday. Leukocytosis on admission without fever and without obvious evidence of infection. Blood cultures obtained on admission. Ceftriaxone IV x1 received on admission. Leukocytosis resolved. Blood cultures with no growth. -Cefazolin IV while inpatient -Follow-up blood cultures -Wound nurse consulted for wound vac management  NSTEMI (non-ST elevated myocardial infarction) ([place]) Troponin of 994 > 1,132 > 2,163 > 1,128. Associated chest pressure and in setting of acute severe anemia. No ischemic changes on EKG. Cardiology consulted on admission and recommended no heparin IV secondary to GI bleeding and severe anemia. Transthoracic Echocardiogram ordered and  significant for normal LVEF. No WMA abnormality, however study was not optimally defined to evaluate for WMA. Cardiology have recommended no further inpatient workup. Recommendation to hold on aspirin for a few weeks and outpatient follow-up in clinic.  Anemia due to GI blood loss Patient with acute on chronic anemia. Recent hemoglobin of 10.7 from 9/11. Acute anemia secondary to melena and presumed GI bleeding. Hemoglobin of 5.5 on admission. Two units of PRBC ordered and transfused with post transfusion hemoglobin up to 7.7. Hemoglobin down to 6.9 on 9/18 and patient transfused 1 additional unit of PRBC. Post-transfusion hemoglobin up to 8.1 and is stable at 7.9. Although hemoglobin is stable, patient had a melanotic stool yesterday. -CBC in AM  Chronic pain syndrome -Continue Tramadol PRN  Essential hypertension Patient is on amlodipine, hydrochlorothiazide and lisinopril as an outpatient, which were held secondary to acute anemia and concern for precipitating hypotension.  Nocturnal hypoxemia -Continue nocturnal oxyen  OSA on CPAP -Continue CPAP  Restless legs syndrome (RLS) -Continue Lyrica, Requip and Tramadol prn  Gastritis See problem, Esophageal ulcer  Esophageal ulcer Presumed source of GI bleeding. Diagnosed on EGD (9/18). GI recommending PPI BID for 12 weeks and repeat EGD to assess for healing of ulcers -Continue Protonix 40 mg BID x12 weeks -No NSAIDs -Outpatient GI follow-up  DNR (do not resuscitate)/DNI(Do Not Intubate) Verified on admission.    DVT prophylaxis: SCDs Code Status:   Code Status: DNR Family Communication: Daughter at bedside Disposition Plan: Discharge pending GI/Cardiology recommendations/management   Consultants:  [facility] Cardiology  Procedures:  Upper endoscopy (9/17)  Antimicrobials: Ceftriaxone Cefazolin    Subjective: Patient states he feels great today. Eager to go home, but he does mention he had an  episode of black stool yesterday.  Objective: BP 114/61 (BP Location: Right Arm)   Pulse 79   Temp 98.1 F (36.7 C) (Oral)   Resp 17   Ht '5\' 8"'$  (1.727 m)   Wt 81 kg   SpO2 94%   BMI 27.15 kg/m   Examination:  General exam: Appears calm and comfortable Respiratory system: Clear to auscultation. Respiratory effort normal. Cardiovascular system: S1 & S2 heard, RRR. Gastrointestinal system: Abdomen is nondistended, soft and nontender. Normal bowel sounds heard. Central nervous system: Alert and oriented. No focal neurological deficits. Musculoskeletal: BLE edema. No calf tenderness Skin: Chronic dermatitis Psychiatry: Judgement and insight appear normal. Mood & affect appropriate.    Data Reviewed: I have personally reviewed following labs and imaging studies  CBC Lab Results  Component Value Date   WBC 11.9 (H) 03/19/2022   RBC 2.46 (L) 03/19/2022   HGB 7.9 (L) 03/19/2022   HCT 24.1 (L) 03/19/2022   MCV 97.6 03/19/2022   MCH 32.1 03/19/2022   PLT 381 03/19/2022   MCHC 32.9 03/19/2022   RDW 18.6 (H) 03/19/2022   LYMPHSABS 1.1 03/17/2022   MONOABS 0.5 03/17/2022   EOSABS 0.1 03/17/2022   BASOSABS 0.4 (H) 63/87/5643     Last metabolic panel Lab Results  Component Value Date   NA 144 03/18/2022   K 3.9 03/18/2022   CL 111 03/18/2022   CO2 24 03/18/2022   BUN 33 (H) 03/18/2022   CREATININE 1.04 03/18/2022   GLUCOSE 115 (H) 03/18/2022   GFRNONAA >60 03/18/2022   GFRAA >60 01/20/2018   CALCIUM 8.3 (L) 03/18/2022   PROT 5.7 (L) 03/17/2022   ALBUMIN 2.5 (L) 03/17/2022   LABGLOB 2.9 05/14/2013   AGRATIO 1.4 05/14/2013   BILITOT 0.3 03/17/2022   ALKPHOS 44 03/17/2022   AST 33 03/17/2022   ALT 17 03/17/2022   ANIONGAP 9 03/18/2022    GFR: Estimated Creatinine Clearance: 42.9 mL/min (by C-G formula based on SCr of 1.04 mg/dL).  Recent Results (from the past 240 hour(s))  SARS Coronavirus 2 by RT PCR (hospital order, performed in [facility] hospital lab)  *cepheid single result test* Anterior Nasal Swab     Status: None   Collection Time: 03/16/22 10:20 PM   Specimen: Anterior Nasal Swab  Result Value Ref Range Status   SARS Coronavirus 2 by RT PCR NEGATIVE NEGATIVE Final    Comment: (NOTE) SARS-CoV-2 target nucleic acids are NOT DETECTED.  The SARS-CoV-2 RNA is generally detectable in upper and lower respiratory specimens during the acute phase of infection. The lowest concentration of SARS-CoV-2 viral copies this assay can detect is 250 copies / mL. A negative result does not preclude SARS-CoV-2 infection and should not be used as the sole basis for treatment or other patient management decisions.  A negative result may occur with improper specimen collection / handling, submission of specimen other than nasopharyngeal swab, presence of viral mutation(s) within the areas targeted by this assay, and inadequate number of viral copies (<250 copies / mL). A negative result must be combined with clinical observations, patient history, and epidemiological information.  Fact Sheet for Patients:   https://www.patel.info/  Fact Sheet for Healthcare Providers: https://hall.com/  This test is not yet approved or  cleared by the [place] FDA and has been authorized for detection and/or diagnosis of SARS-CoV-2 by FDA under an Emergency Use Authorization (EUA).  This EUA will remain in effect (meaning this test can be used) for the duration of the COVID-19 declaration under Section 564(b)(1) of the Act,  21 U.S.C. section 360bbb-3(b)(1), unless the authorization is terminated or revoked sooner.  Performed at [facility], [place] [address], [place], [place] [address]   Culture, blood (routine x 2)     Status: None (Preliminary result)   Collection Time: 03/16/22 10:21 PM   Specimen: BLOOD  Result Value Ref Range Status   Specimen Description BLOOD RIGHT ANTECUBITAL  Final   Special  Requests   Final    BOTTLES DRAWN AEROBIC AND ANAEROBIC Blood Culture results may not be optimal due to an excessive volume of blood received in culture bottles   Culture   Final    NO GROWTH 3 DAYS Performed at [facility], [place] [address], [place], [place] [address]    Report Status PENDING  Incomplete  Culture, blood (routine x 2)     Status: None (Preliminary result)   Collection Time: 03/17/22  7:32 AM   Specimen: BLOOD  Result Value Ref Range Status   Specimen Description BLOOD RIGHT ANTECUBITAL  Final   Special Requests   Final    BOTTLES DRAWN AEROBIC AND ANAEROBIC Blood Culture adequate volume   Culture   Final    NO GROWTH 2 DAYS Performed at [facility], [place] [address] [place], [place] [address]    Report Status PENDING  Incomplete      Radiology Studies: ECHOCARDIOGRAM COMPLETE  Result Date: 03/18/2022    ECHOCARDIOGRAM REPORT   Patient Name:   ELIE LEPPO Date of Exam: 03/18/2022 Medical Rec #:  [id number]       Height:       68.0 in Accession #:    [id number]      Weight:       177.0 lb Date of Birth:  1925/08/26       BSA:          1.940 m Patient Age:    77 years        BP:           119/68 mmHg Patient Gender: M               HR:           76 bpm. Exam Location:  Inpatient Procedure: 2D Echo Indications:    NSTEMI  History:        Patient has prior history of Echocardiogram examinations, most                 recent 09/16/2019. Risk Factors:Hypertension.  Sonographer:    Harvie Junior Referring Phys: Lamar  1. Left ventricular ejection fraction, by estimation, is 50 to 55%. The left ventricle has low normal function. Left ventricular endocardial border not optimally defined to evaluate regional wall motion. Left ventricular diastolic parameters are indeterminate.  2. Right ventricular systolic function was not well visualized. The right ventricular size is mildly enlarged. There is mildly elevated pulmonary artery systolic pressure. The  estimated right ventricular systolic pressure is 47.4 mmHg.  3. Left atrial size was mild to moderately dilated.  4. The mitral valve is normal in structure. Trivial mitral valve regurgitation. No evidence of mitral stenosis.  5. The aortic valve was not well visualized. Aortic valve regurgitation is trivial. No aortic stenosis is present.  6. Aortic dilatation noted. There is borderline dilatation of the aortic root, measuring 37 mm. There is mild dilatation of the ascending aorta, measuring 39 mm.  7. The inferior vena cava is normal in size with greater  than 50% respiratory variability, suggesting right atrial pressure of 3 mmHg.recommend limited study with definity contrast to define regional wall motion. FINDINGS  Left Ventricle: Left ventricular ejection fraction, by estimation, is 50 to 55%. The left ventricle has low normal function. Left ventricular endocardial border not optimally defined to evaluate regional wall motion. The left ventricular internal cavity  size was normal in size. There is no left ventricular hypertrophy. Left ventricular diastolic parameters are indeterminate. Right Ventricle: The right ventricular size is mildly enlarged. No increase in right ventricular wall thickness. Right ventricular systolic function was not well visualized. There is mildly elevated pulmonary artery systolic pressure. The tricuspid regurgitant velocity is 2.97 m/s, and with an assumed right atrial pressure of 3 mmHg, the estimated right ventricular systolic pressure is 71.2 mmHg. Left Atrium: Left atrial size was mild to moderately dilated. Right Atrium: Right atrial size was normal in size. Pericardium: There is no evidence of pericardial effusion. Mitral Valve: The mitral valve is normal in structure. Trivial mitral valve regurgitation. No evidence of mitral valve stenosis. Tricuspid Valve: The tricuspid valve is normal in structure. Tricuspid valve regurgitation is trivial. No evidence of tricuspid stenosis.  Aortic Valve: The aortic valve was not well visualized. Aortic valve regurgitation is trivial. Aortic regurgitation PHT measures 445 msec. No aortic stenosis is present. Aortic valve mean gradient measures 8.0 mmHg. Aortic valve peak gradient measures 14.0 mmHg. Aortic valve area, by VTI measures 1.53 cm. Pulmonic Valve: The pulmonic valve was normal in structure. Pulmonic valve regurgitation is not visualized. No evidence of pulmonic stenosis. Aorta: Aortic dilatation noted. There is borderline dilatation of the aortic root, measuring 37 mm. There is mild dilatation of the ascending aorta, measuring 39 mm. Venous: The inferior vena cava is normal in size with greater than 50% respiratory variability, suggesting right atrial pressure of 3 mmHg. IAS/Shunts: No atrial level shunt detected by color flow Doppler.  LEFT VENTRICLE PLAX 2D LVIDd:         4.90 cm      Diastology LVIDs:         3.60 cm      LV e' medial:    18.90 cm/s LV PW:         1.00 cm      LV E/e' medial:  5.7 LV IVS:        1.00 cm      LV e' lateral:   8.49 cm/s LVOT diam:     2.00 cm      LV E/e' lateral: 12.7 LV SV:         54 LV SV Index:   28 LVOT Area:     3.14 cm  LV Volumes (MOD) LV vol d, MOD A2C: 115.0 ml LV vol d, MOD A4C: 129.0 ml LV vol s, MOD A2C: 68.7 ml LV vol s, MOD A4C: 65.1 ml LV SV MOD A2C:     46.3 ml LV SV MOD A4C:     129.0 ml LV SV MOD BP:      57.9 ml RIGHT VENTRICLE RV Basal diam:  4.20 cm RV S prime:     14.50 cm/s TAPSE (M-mode): 2.2 cm LEFT ATRIUM             Index        RIGHT ATRIUM           Index LA diam:        3.20 cm 1.65 cm/m   RA Area:     10.40  cm LA Vol (A2C):   71.4 ml 36.80 ml/m  RA Volume:   17.10 ml  8.81 ml/m LA Vol (A4C):   74.7 ml 38.50 ml/m LA Biplane Vol: 78.5 ml 40.46 ml/m  AORTIC VALVE                     PULMONIC VALVE AV Area (Vmax):    1.42 cm      PV Vmax:       0.95 m/s AV Area (Vmean):   1.36 cm      PV Peak grad:  3.6 mmHg AV Area (VTI):     1.53 cm AV Vmax:           187.00 cm/s AV  Vmean:          132.000 cm/s AV VTI:            0.351 m AV Peak Grad:      14.0 mmHg AV Mean Grad:      8.0 mmHg LVOT Vmax:         84.40 cm/s LVOT Vmean:        57.300 cm/s LVOT VTI:          0.171 m LVOT/AV VTI ratio: 0.49 AI PHT:            445 msec  AORTA Ao Root diam: 3.70 cm Ao Asc diam:  3.90 cm MITRAL VALVE                TRICUSPID VALVE MV Area (PHT): 3.77 cm     TR Peak grad:   35.3 mmHg MV Decel Time: 201 msec     TR Vmax:        297.00 cm/s MR Peak grad: 16.6 mmHg MR Vmax:      204.00 cm/s   SHUNTS MV E velocity: 108.00 cm/s  Systemic VTI:  0.17 m MV A velocity: 145.00 cm/s  Systemic Diam: 2.00 cm MV E/A ratio:  0.74 Fransico Him MD Electronically signed by Fransico Him MD Signature Date/Time: 03/18/2022/2:46:29 PM    Final       LOS: 3 days    Cordelia Poche, MD [facility] 03/19/2022, 1:17 PM   If 7PM-7AM, please contact night-coverage [email or web address]

## 2022-03-20 DIAGNOSIS — L03115 Cellulitis of right lower limb: Secondary | ICD-10-CM | POA: Diagnosis not present

## 2022-03-20 DIAGNOSIS — D500 Iron deficiency anemia secondary to blood loss (chronic): Secondary | ICD-10-CM | POA: Diagnosis not present

## 2022-03-20 DIAGNOSIS — K254 Chronic or unspecified gastric ulcer with hemorrhage: Secondary | ICD-10-CM | POA: Diagnosis not present

## 2022-03-20 DIAGNOSIS — D649 Anemia, unspecified: Secondary | ICD-10-CM | POA: Diagnosis not present

## 2022-03-20 LAB — CBC
HCT: 24.7 % — ABNORMAL LOW (ref 39.0–52.0)
Hemoglobin: 8 g/dL — ABNORMAL LOW (ref 13.0–17.0)
MCH: 32.3 pg (ref 26.0–34.0)
MCHC: 32.4 g/dL (ref 30.0–36.0)
MCV: 99.6 fL (ref 80.0–100.0)
Platelets: 411 10*3/uL — ABNORMAL HIGH (ref 150–400)
RBC: 2.48 MIL/uL — ABNORMAL LOW (ref 4.22–5.81)
RDW: 18.7 % — ABNORMAL HIGH (ref 11.5–15.5)
WBC: 12.3 10*3/uL — ABNORMAL HIGH (ref 4.0–10.5)
nRBC: 0.2 % (ref 0.0–0.2)

## 2022-03-20 MED ORDER — PANTOPRAZOLE SODIUM 40 MG PO TBEC: 40.0000 mg | DELAYED_RELEASE_TABLET | Freq: Two times a day (BID) | ORAL | 3 refills | Status: DC

## 2022-03-20 MED ORDER — DOXYCYCLINE HYCLATE 100 MG PO TABS: 100.0000 mg | ORAL_TABLET | Freq: Two times a day (BID) | ORAL | 0 refills | Status: DC

## 2022-03-20 MED ORDER — FUROSEMIDE 20 MG PO TABS: 20.0000 mg | ORAL_TABLET | Freq: Every day | ORAL | 3 refills | Status: DC

## 2022-03-20 MED ORDER — DOXYCYCLINE HYCLATE 100 MG PO TABS: 100.0000 mg | ORAL_TABLET | Freq: Two times a day (BID) | ORAL | 0 refills | Status: AC

## 2022-03-20 NOTE — Progress Notes (Signed)
Occupational Therapy Treatment Patient Details Name: Brandon Mason MRN: [id number] DOB: 09/03/1925 Today's Date: 03/20/2022   History of present illness Patient is a 88 y/o male who presents on 03/16/22 with chest pain, weakness and black stools for a few days. Admitted with GI bleed, NSTEMI and recent I&D of thigh abscess at [facility]. PMH includes CKD, HTN, CVA, RLS, sleep apnea.   OT comments  Pt dressed for home modified independently, increased time and effort. He has all necessary AE at home should he choose or need to use it. Educated pt in fall prevention.    Recommendations for follow up therapy are one component of a multi-disciplinary discharge planning process, led by the attending physician.  Recommendations may be updated based on patient status, additional functional criteria and insurance authorization.    Follow Up Recommendations  Home health OT    Assistance Recommended at Discharge Set up Supervision/Assistance  Patient can return home with the following  Assist for transportation;Help with stairs or ramp for entrance;A little help with bathing/dressing/bathroom   Equipment Recommendations  None recommended by OT    Recommendations for Other Services      Precautions / Restrictions Precautions Precautions: Other (comment);Fall Precaution Comments: restless legs and wound vac Restrictions Weight Bearing Restrictions: No       Mobility Bed Mobility               General bed mobility comments: up in recliner on arrival    Transfers Overall transfer level: Modified independent Equipment used: None               General transfer comment: slow to rise     Balance     Sitting balance-Leahy Scale: Good       Standing balance-Leahy Scale: Fair                             ADL either performed or assessed with clinical judgement   ADL Overall ADL's : Modified independent                                        General ADL Comments: Pt able to dress himself with increased time and effort. Fatigues with LB dressing. He has AE at home, but reports it is more trouble than it is worth. Pt's daughter has foam build ups for pen, eating utensils. Pt has button aide. He sits to shower, but washes his hair in standing, recommended pt consider sitting entire shower and using hand held shower head.    Extremity/Trunk Assessment              Vision       Perception     Praxis      Cognition Arousal/Alertness: Awake/alert Behavior During Therapy: WFL for tasks assessed/performed Overall Cognitive Status: Within Functional Limits for tasks assessed                                          Exercises      Shoulder Instructions       General Comments      Pertinent Vitals/ Pain       Pain Assessment Pain Assessment: No/denies pain  Home Living  Prior Functioning/Environment              Frequency  Min 2X/week        Progress Toward Goals  OT Goals(current goals can now be found in the care plan section)  Progress towards OT goals: Progressing toward goals  Acute Rehab OT Goals OT Goal Formulation: With patient/family Time For Goal Achievement: 04/01/22 Potential to Achieve Goals: Good  Plan Discharge plan remains appropriate    Co-evaluation                 AM-PAC OT "6 Clicks" Daily Activity     Outcome Measure   Help from another person eating meals?: None Help from another person taking care of personal grooming?: None Help from another person toileting, which includes using toliet, bedpan, or urinal?: None Help from another person bathing (including washing, rinsing, drying)?: None Help from another person to put on and taking off regular upper body clothing?: None Help from another person to put on and taking off regular lower body clothing?: None 6 Click Score: 24    End of  Session    OT Visit Diagnosis: Unsteadiness on feet (R26.81);Muscle weakness (generalized) (M62.81)   Activity Tolerance     Patient Left     Nurse Communication          Time: 1025-1040 OT Time Calculation (min): 15 min  Charges: OT General Charges $OT Visit: 1 Visit OT Treatments $Self Care/Home Management : 8-22 mins  Cleta Alberts, OTR/L Acute Rehabilitation Services Office: [phone]   Malka So 03/20/2022, 10:58 AM

## 2022-03-20 NOTE — Discharge Instructions (Signed)
  Continue Protonix 40 mg twice a day for for 12 weeks and then once a day Outpatient follow-up with GI scheduled Continue doxycycline, stop date on 03/29/2022 Meloxicam discontinued

## 2022-03-20 NOTE — Discharge Summary (Addendum)
Physician Discharge Summary   Patient: Brandon Mason MRN: [id number] DOB: [birth year]  Admit date:     03/16/2022  Discharge date: 03/20/22  Discharge Physician: Estill Cotta, MD    PCP: Clovia Cuff, MD   Recommendations at discharge:   Continue Protonix 40 mg twice a day for for 12 weeks and then once a day Outpatient follow-up with GI scheduled Continue doxycycline, stop date on 03/29/2022 Meloxicam discontinued  Discharge Diagnoses:  Upper GI bleeding   Anemia due to GI blood loss   NSTEMI (non-ST elevated myocardial infarction) ([place])   Open wound of right thigh   Restless legs syndrome (RLS)   OSA on CPAP   Nocturnal hypoxemia   Essential hypertension   Chronic pain syndrome   DNR (do not resuscitate)/DNI(Do Not Intubate)   Esophageal ulcer   Gastritis   Hospital Course: Brandon Mason is a 88 y.o. male with a history of OSA, chronic pain, restless legs, hypertension, recent I&D s/p wound vac. Patient presented secondary to report of black stools and found to have severe anemia (hemoglobin of 5.5)  with associated elevated troponin. GI and cardiology consulted. Protonix drip started and 2 units of PRBC transfused. Upper endoscopy performed on 9/17 and was significant for esophageal ulcers without stigmata of recent bleeding, in addition to gastritis. PPI recommended.  Assessment and Plan:  Upper GI bleeding with melena, severe symptomatic anemia/GI bleed due to gastritis, esophageal ulcer -Presented with hemoglobin of 5.5, prior hemoglobin was 13.4 on 07/10/2020, received total of 3 units packed RBCs -Patient placed on Protonix drip, EGD performed on 9/17 was significant for gastritis in addition to esophageal ulcers, no stigmata of recent bleeding. -Continue Protonix 40 mg twice daily for 12 more weeks, once a day after that. -Outpatient GI follow-up appointment scheduled -No further bleeding, H&H has been stable, hemoglobin 8.0 at discharge   Recent I&D of infected  hematoma -Patient had a recent I&D of infected hematoma at [facility] on wound VAC and was discharged on doxycycline with end date of 9/23 -Wound VAC recommendations for dressing changes Monday Wednesday Friday. -Leukocytosis resolved, patient was placed on IV cefazolin while inpatient -He will finish the course of doxycycline, question of compliance issue at home, recommended 14-day course including IV cefazolin while inpatient, stop date on 9/29   NSTEMI (non-ST elevated myocardial infarction) ([place]) -Troponins 994 > 1,132 > 2,163 > 1,128 with associated chest pressure in the setting of acute severe anemia due to GI bleed, likely demand ischemia -Cardiology was consulted, recommended no IV heparin due to GI bleeding and severe anemia. -2D echo showed normal EF, no WMA, cardiology recommended no further inpatient work-up.  Outpatient follow-up recommended  Acute on chronic anemia due to GI blood loss -Hemoglobin 5.5 on admission, received total of 3 units packed RBCs this hospitalization -H&H has remained stable, trending up, 8.0 on discharge   Gastritis, esophageal ulcer -See #1, no NSAIDs -Continue Protonix 40 mg twice daily for 12 weeks, then daily -Outpatient follow-up with GI  Chronic pain syndrome -Continue Tramadol PRN   Essential hypertension -BP has remained soft, amlodipine, HCTZ, lisinopril held  Nocturnal hypoxemia -Continue nocturnal oxyen   OSA on CPAP -Continue CPAP   Restless legs syndrome (RLS) -Continue Lyrica, Requip and Tramadol prn         Consultants: GI , cardiology Procedures performed: 2D echo, EGD Disposition: Home Diet recommendation:  Discharge Diet Orders (From admission, onward)     Start  Ordered   03/20/22 0000  Diet - low sodium heart healthy        03/20/22 1054           Cardiac diet DISCHARGE MEDICATION: Allergies as of 03/20/2022   No Known Allergies      Medication List     STOP taking  these medications    amLODipine 10 MG tablet Commonly known as: NORVASC   hydrochlorothiazide 12.5 MG capsule Commonly known as: MICROZIDE   lisinopril 20 MG tablet Commonly known as: ZESTRIL   meloxicam 15 MG tablet Commonly known as: MOBIC       TAKE these medications    doxycycline 100 MG tablet Commonly known as: VIBRA-TABS Take 1 tablet (100 mg total) by mouth 2 (two) times daily for 9 days. Stop date on 03/29/22. What changed: additional instructions   finasteride 5 MG tablet Commonly known as: PROSCAR TAKE 1 TABLET BY MOUTH  DAILY   furosemide 20 MG tablet Commonly known as: LASIX Take 1 tablet (20 mg total) by mouth daily. Start taking on: March 21, 2022   mupirocin ointment 2 % Commonly known as: BACTROBAN APPLY  OINTMENT TOPICALLY TO AFFECTED AREA TWICE DAILY   nystatin cream Commonly known as: MYCOSTATIN APPLY  CREAM TOPICALLY TO AFFECTED AREA TWICE DAILY   OVER THE COUNTER MEDICATION Take 1 tablet by mouth 2 (two) times daily. MetaNx vitamin/supplement   pantoprazole 40 MG tablet Commonly known as: PROTONIX Take 1 tablet (40 mg total) by mouth 2 (two) times daily before a meal.   pregabalin 150 MG capsule Commonly known as: LYRICA Take 150 mg by mouth 2 (two) times daily.   rOPINIRole 4 MG tablet Commonly known as: REQUIP TAKE 1 TABLET BY MOUTH AT  BEDTIME What changed:  how much to take when to take this additional instructions   traMADol 50 MG tablet Commonly known as: ULTRAM TAKE 1 TABLET BY MOUTH EVERY 8 HOURS AS NEEDED FOR SEVERE PAIN What changed:  how much to take how to take this when to take this additional instructions               Discharge Care Instructions  (From admission, onward)           Start     Ordered   03/20/22 0000  If the dressing is still on your incision site when you go home, remove it on the third day after your surgery date. Remove dressing if it begins to fall off, or if it is dirty or  damaged before the third day.        03/20/22 1054            Follow-up Information     Danis, Kirke Corin, MD Follow up on 04/30/2022.   Specialty: Gastroenterology Why: at 1:40PM, for hospital follow-up Contact information: [place] [address] [phone]         Clovia Cuff, MD. Schedule an appointment as soon as possible for a visit in 2 week(s).   Specialty: Internal Medicine Why: for hospital follow-up Contact information: [place] [address] [phone]         Croitoru, Dani Gobble, MD. Schedule an appointment as soon as possible for a visit in 2 week(s).   Specialty: Cardiology Contact information: [address] [place] [address] [phone]                Discharge Exam: Danley Danker Weights   03/19/22  6222 03/19/22 0809 03/20/22 0500  Weight: 81.5 kg 81 kg 82.1 kg   S: No acute complaints, hoping to go home today.  No further bleeding H&H stable  Vitals:   03/19/22 2018 03/20/22 0434 03/20/22 0500 03/20/22 0750  BP: 110/67 (!) 141/73  105/60  Pulse: 100 76  77  Resp:    17  Temp: 98.2 F (36.8 C) (!) 97.5 F (36.4 C)  99 F (37.2 C)  TempSrc: Oral Oral  Oral  SpO2: 97% 97%  98%  Weight:   82.1 kg   Height:        Physical Exam General: Alert and oriented x 3, NAD Cardiovascular: S1 S2 clear, RRR.  Respiratory: CTAB, no wheezing, rales or rhonchi Gastrointestinal: Soft, nontender, nondistended, NBS Ext: no pedal edema bilaterally Neuro: no new deficits Skin: Wound VAC right thigh Psych: Normal affect and demeanor, alert and oriented x3    Condition at discharge: fair  The results of significant diagnostics from this hospitalization (including imaging, microbiology, ancillary and laboratory) are listed below for reference.   Imaging Studies: ECHOCARDIOGRAM COMPLETE  Result Date: 03/18/2022    ECHOCARDIOGRAM REPORT   Patient Name:   Brandon Mason  Date of Exam: 03/18/2022 Medical Rec #:  [id number]       Height:       68.0 in Accession #:    [id number]      Weight:       177.0 lb Date of Birth:  1925/07/20       BSA:          1.940 m Patient Age:    11 years        BP:           119/68 mmHg Patient Gender: M               HR:           76 bpm. Exam Location:  Inpatient Procedure: 2D Echo Indications:    NSTEMI  History:        Patient has prior history of Echocardiogram examinations, most                 recent 09/16/2019. Risk Factors:Hypertension.  Sonographer:    Harvie Junior Referring Phys: [place]  1. Left ventricular ejection fraction, by estimation, is 50 to 55%. The left ventricle has low normal function. Left ventricular endocardial border not optimally defined to evaluate regional wall motion. Left ventricular diastolic parameters are indeterminate.  2. Right ventricular systolic function was not well visualized. The right ventricular size is mildly enlarged. There is mildly elevated pulmonary artery systolic pressure. The estimated right ventricular systolic pressure is 18.5 mmHg.  3. Left atrial size was mild to moderately dilated.  4. The mitral valve is normal in structure. Trivial mitral valve regurgitation. No evidence of mitral stenosis.  5. The aortic valve was not well visualized. Aortic valve regurgitation is trivial. No aortic stenosis is present.  6. Aortic dilatation noted. There is borderline dilatation of the aortic root, measuring 37 mm. There is mild dilatation of the ascending aorta, measuring 39 mm.  7. The inferior vena cava is normal in size with greater than 50% respiratory variability, suggesting right atrial pressure of 3 mmHg.recommend limited study with definity contrast to define regional wall motion. FINDINGS  Left Ventricle: Left ventricular ejection fraction, by estimation, is 50 to 55%. The left ventricle has low normal function. Left ventricular endocardial border not optimally defined to  evaluate  regional wall motion. The left ventricular internal cavity  size was normal in size. There is no left ventricular hypertrophy. Left ventricular diastolic parameters are indeterminate. Right Ventricle: The right ventricular size is mildly enlarged. No increase in right ventricular wall thickness. Right ventricular systolic function was not well visualized. There is mildly elevated pulmonary artery systolic pressure. The tricuspid regurgitant velocity is 2.97 m/s, and with an assumed right atrial pressure of 3 mmHg, the estimated right ventricular systolic pressure is 41.9 mmHg. Left Atrium: Left atrial size was mild to moderately dilated. Right Atrium: Right atrial size was normal in size. Pericardium: There is no evidence of pericardial effusion. Mitral Valve: The mitral valve is normal in structure. Trivial mitral valve regurgitation. No evidence of mitral valve stenosis. Tricuspid Valve: The tricuspid valve is normal in structure. Tricuspid valve regurgitation is trivial. No evidence of tricuspid stenosis. Aortic Valve: The aortic valve was not well visualized. Aortic valve regurgitation is trivial. Aortic regurgitation PHT measures 445 msec. No aortic stenosis is present. Aortic valve mean gradient measures 8.0 mmHg. Aortic valve peak gradient measures 14.0 mmHg. Aortic valve area, by VTI measures 1.53 cm. Pulmonic Valve: The pulmonic valve was normal in structure. Pulmonic valve regurgitation is not visualized. No evidence of pulmonic stenosis. Aorta: Aortic dilatation noted. There is borderline dilatation of the aortic root, measuring 37 mm. There is mild dilatation of the ascending aorta, measuring 39 mm. Venous: The inferior vena cava is normal in size with greater than 50% respiratory variability, suggesting right atrial pressure of 3 mmHg. IAS/Shunts: No atrial level shunt detected by color flow Doppler.  LEFT VENTRICLE PLAX 2D LVIDd:         4.90 cm      Diastology LVIDs:         3.60 cm      LV e'  medial:    18.90 cm/s LV PW:         1.00 cm      LV E/e' medial:  5.7 LV IVS:        1.00 cm      LV e' lateral:   8.49 cm/s LVOT diam:     2.00 cm      LV E/e' lateral: 12.7 LV SV:         54 LV SV Index:   28 LVOT Area:     3.14 cm  LV Volumes (MOD) LV vol d, MOD A2C: 115.0 ml LV vol d, MOD A4C: 129.0 ml LV vol s, MOD A2C: 68.7 ml LV vol s, MOD A4C: 65.1 ml LV SV MOD A2C:     46.3 ml LV SV MOD A4C:     129.0 ml LV SV MOD BP:      57.9 ml RIGHT VENTRICLE RV Basal diam:  4.20 cm RV S prime:     14.50 cm/s TAPSE (M-mode): 2.2 cm LEFT ATRIUM             Index        RIGHT ATRIUM           Index LA diam:        3.20 cm 1.65 cm/m   RA Area:     10.40 cm LA Vol (A2C):   71.4 ml 36.80 ml/m  RA Volume:   17.10 ml  8.81 ml/m LA Vol (A4C):   74.7 ml 38.50 ml/m LA Biplane Vol: 78.5 ml 40.46 ml/m  AORTIC VALVE  PULMONIC VALVE AV Area (Vmax):    1.42 cm      PV Vmax:       0.95 m/s AV Area (Vmean):   1.36 cm      PV Peak grad:  3.6 mmHg AV Area (VTI):     1.53 cm AV Vmax:           187.00 cm/s AV Vmean:          132.000 cm/s AV VTI:            0.351 m AV Peak Grad:      14.0 mmHg AV Mean Grad:      8.0 mmHg LVOT Vmax:         84.40 cm/s LVOT Vmean:        57.300 cm/s LVOT VTI:          0.171 m LVOT/AV VTI ratio: 0.49 AI PHT:            445 msec  AORTA Ao Root diam: 3.70 cm Ao Asc diam:  3.90 cm MITRAL VALVE                TRICUSPID VALVE MV Area (PHT): 3.77 cm     TR Peak grad:   35.3 mmHg MV Decel Time: 201 msec     TR Vmax:        297.00 cm/s MR Peak grad: 16.6 mmHg MR Vmax:      204.00 cm/s   SHUNTS MV E velocity: 108.00 cm/s  Systemic VTI:  0.17 m MV A velocity: 145.00 cm/s  Systemic Diam: 2.00 cm MV E/A ratio:  0.74 Fransico Him MD Electronically signed by Fransico Him MD Signature Date/Time: 03/18/2022/2:46:29 PM    Final    DG Chest 2 View  Result Date: 03/16/2022 CLINICAL DATA:  Chest pain EXAM: CHEST - 2 VIEW COMPARISON:  03/08/2022 FINDINGS: Right basilar atelectasis. No focal  airspace consolidation. No pleural effusion or pneumothorax. Mild cardiomegaly is unchanged. IMPRESSION: Right basilar atelectasis. Electronically Signed   By: Ulyses Jarred M.D.   On: 03/16/2022 20:41    Microbiology: Results for orders placed or performed during the hospital encounter of 03/16/22  SARS Coronavirus 2 by RT PCR (hospital order, performed in [facility] hospital lab) *cepheid single result test* Anterior Nasal Swab     Status: None   Collection Time: 03/16/22 10:20 PM   Specimen: Anterior Nasal Swab  Result Value Ref Range Status   SARS Coronavirus 2 by RT PCR NEGATIVE NEGATIVE Final    Comment: (NOTE) SARS-CoV-2 target nucleic acids are NOT DETECTED.  The SARS-CoV-2 RNA is generally detectable in upper and lower respiratory specimens during the acute phase of infection. The lowest concentration of SARS-CoV-2 viral copies this assay can detect is 250 copies / mL. A negative result does not preclude SARS-CoV-2 infection and should not be used as the sole basis for treatment or other patient management decisions.  A negative result may occur with improper specimen collection / handling, submission of specimen other than nasopharyngeal swab, presence of viral mutation(s) within the areas targeted by this assay, and inadequate number of viral copies (<250 copies / mL). A negative result must be combined with clinical observations, patient history, and epidemiological information.  Fact Sheet for Patients:   https://www.patel.info/  Fact Sheet for Healthcare Providers: https://hall.com/  This test is not yet approved or  cleared by the [place] FDA and has been authorized for detection and/or diagnosis of SARS-CoV-2 by FDA under an  Emergency Use Authorization (EUA).  This EUA will remain in effect (meaning this test can be used) for the duration of the COVID-19 declaration under Section 564(b)(1) of the Act, 21  U.S.C. section 360bbb-3(b)(1), unless the authorization is terminated or revoked sooner.  Performed at [facility], [place] [address], [place], [place] [address]   Culture, blood (routine x 2)     Status: None (Preliminary result)   Collection Time: 03/16/22 10:21 PM   Specimen: BLOOD  Result Value Ref Range Status   Specimen Description BLOOD RIGHT ANTECUBITAL  Final   Special Requests   Final    BOTTLES DRAWN AEROBIC AND ANAEROBIC Blood Culture results may not be optimal due to an excessive volume of blood received in culture bottles   Culture   Final    NO GROWTH 4 DAYS Performed at [facility], [place] [address], [place], [place] [address]    Report Status PENDING  Incomplete  Culture, blood (routine x 2)     Status: None (Preliminary result)   Collection Time: 03/17/22  7:32 AM   Specimen: BLOOD  Result Value Ref Range Status   Specimen Description BLOOD RIGHT ANTECUBITAL  Final   Special Requests   Final    BOTTLES DRAWN AEROBIC AND ANAEROBIC Blood Culture adequate volume   Culture   Final    NO GROWTH 3 DAYS Performed at [facility], [place] [address], [place], [place] [address]    Report Status PENDING  Incomplete    Labs: CBC: Recent Labs  Lab 03/16/22 2024 03/17/22 1241 03/18/22 0823 03/18/22 2115 03/19/22 0039 03/19/22 0819 03/20/22 0026  WBC 18.0* 13.7* 10.5  --  11.9*  --  12.3*  NEUTROABS  --  11.5*  --   --   --   --   --   HGB 5.5* 7.7* 6.9* 8.1* 7.9* 7.9* 8.0*  HCT 17.5* 22.1* 20.6* 24.6* 24.0* 24.1* 24.7*  MCV 106.1* 94.0 99.0  --  97.6  --  99.6  PLT 409* 361 357  --  381  --  224*   Basic Metabolic Panel: Recent Labs  Lab 03/16/22 2024 03/17/22 1241 03/18/22 0823  NA 139 140 144  K 4.0 3.9 3.9  CL 109 109 111  CO2 '24 25 24  '$ GLUCOSE 132* 122* 115*  BUN 60* 46* 33*  CREATININE 1.09 0.97 1.04  CALCIUM 8.1* 8.2* 8.3*  MG  --  2.1  --    Liver Function Tests: Recent Labs  Lab 03/17/22 1241  AST 33  ALT 17  ALKPHOS 44   BILITOT 0.3  PROT 5.7*  ALBUMIN 2.5*   CBG: No results for input(s): "GLUCAP" in the last 168 hours.  Discharge time spent: greater than 30 minutes.  Signed: Estill Cotta, MD [facility] 03/20/2022

## 2022-03-20 NOTE — Progress Notes (Signed)
Physical Therapy Treatment Patient Details Name: Brandon Mason MRN: [id number] DOB: 10-01-1925 Today's Date: 03/20/2022   History of Present Illness Patient is a 88 y/o male who presents on 03/16/22 with chest pain, weakness and black stools for a few days. Admitted with GI bleed, NSTEMI and recent I&D of thigh abscess at [facility]. PMH includes CKD, HTN, CVA, RLS, sleep apnea.    PT Comments    Patient seen to progress mobility and assess need for AD at home. Patient ambulated extended hallway distance with no AD and close supervision with patient stating "I'm wobbly" throughout. Drifting L/R during gait but no overt LOB noted. Encouraged patient to utilize RW at home for safety as patient also informing therapist of ~6 falls in past months. Patient reluctant to utilize RW despite education on importance and fall risk. No PT follow up recommended at this time.     Recommendations for follow up therapy are one component of a multi-disciplinary discharge planning process, led by the attending physician.  Recommendations may be updated based on patient status, additional functional criteria and insurance authorization.  Follow Up Recommendations  No PT follow up     Assistance Recommended at Discharge Intermittent Supervision/Assistance  Patient can return home with the following A little help with walking and/or transfers;A little help with bathing/dressing/bathroom;Help with stairs or ramp for entrance;Assistance with cooking/housework   Equipment Recommendations  None recommended by PT    Recommendations for Other Services       Precautions / Restrictions Precautions Precautions: Other (comment);Fall Precaution Comments: restless legs and wound vac Restrictions Weight Bearing Restrictions: No     Mobility  Bed Mobility               General bed mobility comments: up in recliner on arrival    Transfers Overall transfer level: Needs assistance Equipment used:  None Transfers: Sit to/from Stand Sit to Stand: Supervision           General transfer comment: supervision for safety.    Ambulation/Gait Ambulation/Gait assistance: Supervision Gait Distance (Feet): 550 Feet Assistive device: None Gait Pattern/deviations: Step-through pattern, Decreased stride length, Wide base of support Gait velocity: decreased     General Gait Details: supervision for safety. Unsteady throughout and drifting L/R. Patient stating "I'm wobbly" during gait. Encouraged RW at home for safety as patient states he has had half a dozen falls at home in past 6 months'   Stairs             Wheelchair Mobility    Modified Rankin (Stroke Patients Only)       Balance Overall balance assessment: Needs assistance Sitting-balance support: Feet supported, No upper extremity supported Sitting balance-Leahy Scale: Good     Standing balance support: During functional activity, No upper extremity supported Standing balance-Leahy Scale: Fair Standing balance comment: balance deficits noted with ambulation                            Cognition Arousal/Alertness: Awake/alert Behavior During Therapy: WFL for tasks assessed/performed Overall Cognitive Status: Within Functional Limits for tasks assessed                                          Exercises      General Comments        Pertinent Vitals/Pain Pain Assessment Pain Assessment: No/denies  pain    Home Living                          Prior Function            PT Goals (current goals can now be found in the care plan section) Acute Rehab PT Goals Patient Stated Goal: to go home PT Goal Formulation: With patient Time For Goal Achievement: 04/01/22 Potential to Achieve Goals: Good Progress towards PT goals: Progressing toward goals    Frequency    Min 3X/week      PT Plan Current plan remains appropriate    Co-evaluation               AM-PAC PT "6 Clicks" Mobility   Outcome Measure  Help needed turning from your back to your side while in a flat bed without using bedrails?: A Little Help needed moving from lying on your back to sitting on the side of a flat bed without using bedrails?: A Little Help needed moving to and from a bed to a chair (including a wheelchair)?: A Little Help needed standing up from a chair using your arms (e.g., wheelchair or bedside chair)?: A Little Help needed to walk in hospital room?: A Little Help needed climbing 3-5 steps with a railing? : A Little 6 Click Score: 18    End of Session   Activity Tolerance: Patient tolerated treatment well Patient left: in chair;with call bell/phone within reach Nurse Communication: Mobility status PT Visit Diagnosis: Muscle weakness (generalized) (M62.81);Difficulty in walking, not elsewhere classified (R26.2);Unsteadiness on feet (R26.81)     Time: 6073-7106 PT Time Calculation (min) (ACUTE ONLY): 12 min  Charges:  $Therapeutic Activity: 8-22 mins                     Sian Joles A. Gilford Rile PT, DPT Acute Rehabilitation Services Office [phone]    Linna Hoff 03/20/2022, 9:32 AM

## 2022-03-20 NOTE — TOC Transition Note (Addendum)
Transition of Care [facility]) - CM/SW Discharge Note   Patient Details  Name: Brandon Mason MRN: [id number] Date of Birth: 11/14/1925  Transition of Care [facility]) CM/SW Contact:  Carles Collet, RN Phone Number: 03/20/2022, 11:11 AM   Clinical Narrative:     Notified Maryellen Pile [place] that patient will C today for resumption of House services PT OT RN, and [facility] care Notified [place] [facility] that patient will return home with [facility] and that [place] will resume [place] services.  Confirmed with patient at bedside that his daughter will pick him up. Called daugter, no answer.  Manganello,Teresja (Daughter)  [phone]    Final next level of care: [place] Barriers to Discharge: No Barriers Identified   Patient Goals and CMS Choice Patient states their goals for this hospitalization and ongoing recovery are:: to return to home CMS Medicare.gov Compare Post Acute Care list provided to:: Patient Choice offered to / list presented to : Patient, Spouse  Discharge Placement                       Discharge Plan and Services     Post Acute Care Choice: Home Health          DME Arranged: N/A DME Agency: NA       HH Arranged: RN, PT, OT HH Agency: [place] Date [place]: 03/20/22 Time [place]: 1111 Representative spoke with at [place]: [place] Determinants of Health ([place]) Interventions     Readmission Risk Interventions     No data to display

## 2022-03-21 LAB — CULTURE, BLOOD (ROUTINE X 2): Culture: NO GROWTH

## 2022-03-22 LAB — CULTURE, BLOOD (ROUTINE X 2)
Culture: NO GROWTH
Special Requests: ADEQUATE

## 2022-03-27 NOTE — Anesthesia Postprocedure Evaluation (Signed)
Anesthesia Post Note  Patient: Brandon Mason  Procedure(s) Performed: ESOPHAGOGASTRODUODENOSCOPY (EGD) WITH PROPOFOL     Patient location during evaluation: PACU Anesthesia Type: MAC Level of consciousness: awake and alert Pain management: pain level controlled Vital Signs Assessment: post-procedure vital signs reviewed and stable Respiratory status: spontaneous breathing, nonlabored ventilation, respiratory function stable and patient connected to nasal cannula oxygen Cardiovascular status: stable and blood pressure returned to baseline Postop Assessment: no apparent nausea or vomiting Anesthetic complications: no   No notable events documented.  Last Vitals:  Vitals:   03/20/22 0434 03/20/22 0750  BP: (!) 141/73 105/60  Pulse: 76 77  Resp:  17  Temp: (!) 36.4 C 37.2 C  SpO2: 97% 98%    Last Pain:  Vitals:   03/20/22 0750  TempSrc: Oral  PainSc:                  Costilla S

## 2022-04-08 ENCOUNTER — Telehealth: Payer: Self-pay | Admitting: Medical

## 2022-04-08 NOTE — Telephone Encounter (Signed)
FYI- Patient called to advise that he has changed his PCP and does not need to receive any more calls from our office.

## 2022-04-30 ENCOUNTER — Encounter: Payer: Self-pay | Admitting: Gastroenterology

## 2022-04-30 ENCOUNTER — Ambulatory Visit: Payer: Medicare HMO | Admitting: Gastroenterology

## 2022-04-30 VITALS — BP 160/80 | HR 84 | Ht 68.0 in | Wt 174.0 lb

## 2022-04-30 DIAGNOSIS — D62 Acute posthemorrhagic anemia: Secondary | ICD-10-CM | POA: Diagnosis not present

## 2022-04-30 DIAGNOSIS — K2211 Ulcer of esophagus with bleeding: Secondary | ICD-10-CM | POA: Diagnosis not present

## 2022-04-30 NOTE — Progress Notes (Signed)
[facility] progress note:  History: Brandon Mason 04/30/2022  Referring provider: Clovia Cuff, MD  Reason for consult/chief complaint: GI Bleeding (Post hospital;  pt states he is still weak)   Subjective  HPI: Brandon Mason is seen in follow-up after recent hospitalization.  I saw him once in 2018 for bloating and flatulence. He was admitted to the hospital September 17 with melena and profound anemia, having had a recent incision and drainage of a thigh wound that grew out MRSA -required a wound VAC and doxycycline.  H&P and discharge summary indicate he had been taking meloxicam which was discontinued at the time of discharge. Reported non-STEMI with troponin over 2000, evaluated by cardiology and felt related to demand from profound anemia.  No further inpatient ischemic work-up done. EGD by Dr. [name] revealed 3 distal esophageal ulcers felt likely to been the source of bleeding.  (Procedure report reviewed) _______________   Brandon Mason was here with one of his daughters today.  He has had no overt GI bleeding since hospital discharge.  His appetite has returned to normal, he denies nausea vomiting or dysphagia or odynophagia.  He was not discharged on iron supplements, his daughter says she brought it up with primary care when indicated recent housecall and they prescribed some which were finally available at her pharmacy yesterday.  He is still taking pantoprazole 40 mg twice daily.  Brandon Mason still has a significant decrease in his normal energy level and he has been unable to be as active at the [facility] as he had prior to this illness. They do confirm he had been taking meloxicam for a few months prior to this admission.  ROS:  Review of Systems Denies chest pain Chronic dyspnea Arthralgias Decreased hearing acuity, uses hearing aids  Remainder of systems negative except as above  Past Medical History: Past Medical History:  Diagnosis Date   Abnormality of gait  02/09/2013   Arthritis    Cancer (HCC)    basal cell skin cancer   Cardiomegaly    Chronic kidney disease    BPH   Chronic leg pain    Hypersomnia    Hypertension    Hypoxia    pulmonary   Neuromuscular disorder (HCC)    restless leg syndrome    Nocturia    Prostate disorder    Restless legs syndrome (RLS) 02/09/2013   Restless legs syndrome with nocturnal myoclonus    RLS (restless legs syndrome)    x 40 years   Shortness of breath    with exertion    Sleep apnea    uses cpap   Stroke ([place])    minor stroke in 2003 - no lasting side effects   UARS (upper airway resistance syndrome) 06/28/2014     Past Surgical History: Past Surgical History:  Procedure Laterality Date   ANTERIOR CERVICAL DECOMP/DISCECTOMY FUSION N/A 03/29/2016   Procedure: ANTERIOR CERVICAL DECOMPRESSION/DISCECTOMY FUSION CERVICAL THREE- CERVICAL FOUR;  Surgeon: Ashok Pall, MD;  Location: [facility];  Service: Neurosurgery;  Laterality: N/A;   BACK SURGERY  2003   L4-5   CATARACT EXTRACTION Bilateral    ESOPHAGOGASTRODUODENOSCOPY (EGD) WITH PROPOFOL N/A 03/17/2022   Procedure: ESOPHAGOGASTRODUODENOSCOPY (EGD) WITH PROPOFOL;  Surgeon: Sharyn Creamer, MD;  Location: [facility];  Service: Gastroenterology;  Laterality: N/A;   HERNIA REPAIR     inguinal hernia   TRANSURETHRAL PROSTATECTOMY WITH GYRUS INSTRUMENTS  04/28/2012   Procedure: TRANSURETHRAL PROSTATECTOMY WITH GYRUS INSTRUMENTS;  Surgeon: Fredricka Bonine, MD;  Location:  [facility];  Service: Urology;  Laterality: N/A;   TRANSURETHRAL RESECTION OF PROSTATE  03/24/2012   Procedure: TRANSURETHRAL RESECTION OF THE PROSTATE (TURP);  Surgeon: Fredricka Bonine, MD;  Location: [facility];  Service: Urology;  Laterality: N/A;  with gyrus ,Greenlight PVP laser of Prostate     Family History: Family History  Problem Relation Age of Onset   Restless legs syndrome Mother    Restless legs syndrome Daughter    Esophageal cancer Neg Hx    Colon  cancer Neg Hx    Pancreatic cancer Neg Hx    Stomach cancer Neg Hx     Social History: Social History   Socioeconomic History   Marital status: Married    Spouse name: Ann   Number of children: 5   Years of education: 12   Highest education level: Not on file  Occupational History   Occupation: retired     Comment: [name] for a [employer]  Tobacco Use   Smoking status: Never   Smokeless tobacco: Never  Substance and Sexual Activity   Alcohol use: Yes    Alcohol/week: 2.0 standard drinks of alcohol    Types: 2 Cans of beer per week    Comment: weekly   Drug use: No   Sexual activity: Not on file  Other Topics Concern   Not on file  Social History Narrative   Patient lives at home with his wife [name].   Patient is retired.    Patient has 5 children.   Patient has a high school education.   Patient is right-handed.   Patient drinks very little caffeine.   Social Determinants of Health   Financial Resource Strain: Not on file  Food Insecurity: Not on file  Transportation Needs: Not on file  Physical Activity: Not on file  Stress: Not on file  Social Connections: Not on file    Allergies: No Known Allergies  Outpatient Meds: Current Outpatient Medications  Medication Sig Dispense Refill   finasteride (PROSCAR) 5 MG tablet TAKE 1 TABLET BY MOUTH  DAILY (Patient taking differently: Take 5 mg by mouth daily.) 90 tablet 1   furosemide (LASIX) 20 MG tablet Take 1 tablet (20 mg total) by mouth daily. 30 tablet 3   mupirocin ointment (BACTROBAN) 2 % APPLY  OINTMENT TOPICALLY TO AFFECTED AREA TWICE DAILY 22 g 0   nystatin cream (MYCOSTATIN) APPLY  CREAM TOPICALLY TO AFFECTED AREA TWICE DAILY 30 g 0   OVER THE COUNTER MEDICATION Take 1 tablet by mouth 2 (two) times daily. MetaNx vitamin/supplement     pantoprazole (PROTONIX) 40 MG tablet Take 1 tablet (40 mg total) by mouth 2 (two) times daily before a meal. 60 tablet 3   pregabalin (LYRICA) 150 MG capsule Take 150  mg by mouth 2 (two) times daily.     rOPINIRole (REQUIP) 4 MG tablet TAKE 1 TABLET BY MOUTH AT  BEDTIME (Patient taking differently: Take 2 mg by mouth See admin instructions. Take 1/2 tablet at noon and 1/2 tablet at at night (10pm)) 90 tablet 3   traMADol (ULTRAM) 50 MG tablet TAKE 1 TABLET BY MOUTH EVERY 8 HOURS AS NEEDED FOR SEVERE PAIN (Patient taking differently: Take 50 mg by mouth See admin instructions. Take 1 tablet by mouth every 4 to 6 hours for severe pain. May take 1 additional tablet at night if needed 0700, 1200, 1700, 2200) 90 tablet 0   No current facility-administered medications for this visit.      ___________________________________________________________________  Objective   Exam:  BP (!) 160/80   Pulse 84   Ht '5\' 8"'$  (1.727 m)   Wt 174 lb (78.9 kg)   BMI 26.46 kg/m  Wt Readings from Last 3 Encounters:  04/30/22 174 lb (78.9 kg)  03/20/22 181 lb (82.1 kg)  03/15/21 178 lb (80.7 kg)    General: Well-appearing cognitively:, Gets on exam table slowly but unassisted. Eyes: sclera anicteric, no redness ENT: oral mucosa moist without lesions, no cervical or supraclavicular lymphadenopathy CV: Regular without murmur, no JVD, no peripheral edema (compression stockings) Resp: clear to auscultation bilaterally, normal RR and effort noted GI: soft, no tenderness, with active bowel sounds. No guarding or palpable organomegaly noted. Skin; warm and dry, no rash or jaundice noted Neuro: awake, alert and oriented x 3. Normal gross motor function and fluent speech  Labs:     Latest Ref Rng & Units 03/20/2022   12:26 AM 03/19/2022    8:19 AM 03/19/2022   12:39 AM  CBC  WBC 4.0 - 10.5 K/uL 12.3   11.9   Hemoglobin 13.0 - 17.0 g/dL 8.0  7.9  7.9   Hematocrit 39.0 - 52.0 % 24.7  24.1  24.0   Platelets 150 - 400 K/uL 411   381    Received 3 units PRBC transfusion during admission for initial hemoglobin 5.5  His daughter says that hemoglobin checked by [facility] provider soon  after hospital discharge was 8.4, then a check by primary care 2 to 3 weeks ago was 9.7 (no primary care records available today)  Assessment: Encounter Diagnoses  Name Primary?   Esophageal ulcer with bleeding Yes   Acute blood loss anemia     Recent acute GI bleeding with esophageal ulcers found on endoscopy.  There were clean-based ulcers and he had substantial acute blood loss anemia.  This makes me wonder if he may have had more distal small bowel ulcer disease that stopped bleeding.  NSAID use seems to been less likely culprit.  He is still recovering from the significant blood loss of this illness that occurred on the heels of a serious wound infection that is now reportedly much improved. Plan: His primary care provider has prescribed that he will start today or tomorrow, and it sounds like they are regularly checking his hemoglobin and hematocrit to determine how long he needs iron supplementation.  I believe the visualized esophageal ulcers have healed by now, so I recommended decreasing the pantoprazole to once daily for the next 10 to 14 days, and then discontinuing it.  If he does not go back on NSAIDs, and since he does not describe pre-existing heartburn and regurgitation, then I think he is at low risk of developing recurrent ulcer disease.  He will otherwise see me as needed.   33 minutes were spent on this encounter (including chart review, history/exam, counseling/coordination of care, and documentation) > 50% of that time was spent on counseling and coordination of care.   Nelida Meuse III  CC: Referring provider noted above

## 2022-04-30 NOTE — Patient Instructions (Signed)
_______________________________________________________  If you are age 86 or older, your body mass index should be between 23-30. Your Body mass index is 26.46 kg/m. If this is out of the aforementioned range listed, please consider follow up with your Primary Care Provider.  If you are age 66 or younger, your body mass index should be between 19-25. Your Body mass index is 26.46 kg/m. If this is out of the aformentioned range listed, please consider follow up with your Primary Care Provider.   ________________________________________________________  The [facility] providers would like to encourage you to use [facility] to communicate with providers for non-urgent requests or questions.  Due to long hold times on the telephone, sending your provider a message by [facility] may be a faster and more efficient way to get a response.  Please allow 48 business hours for a response.  Please remember that this is for non-urgent requests.  _______________________________________________________  It was a pleasure to see you today!  Thank you for trusting me with your gastrointestinal care!

## 2022-05-08 ENCOUNTER — Encounter: Payer: Self-pay | Admitting: Cardiovascular Disease

## 2022-05-08 ENCOUNTER — Ambulatory Visit: Payer: Medicare HMO | Attending: Cardiovascular Disease | Admitting: Cardiovascular Disease

## 2022-05-08 VITALS — BP 135/72 | HR 76 | Ht 68.0 in | Wt 171.8 lb

## 2022-05-08 DIAGNOSIS — I739 Peripheral vascular disease, unspecified: Secondary | ICD-10-CM | POA: Diagnosis not present

## 2022-05-08 DIAGNOSIS — D500 Iron deficiency anemia secondary to blood loss (chronic): Secondary | ICD-10-CM

## 2022-05-08 DIAGNOSIS — I2510 Atherosclerotic heart disease of native coronary artery without angina pectoris: Secondary | ICD-10-CM

## 2022-05-08 DIAGNOSIS — R0602 Shortness of breath: Secondary | ICD-10-CM

## 2022-05-08 NOTE — Patient Instructions (Signed)
Medication Instructions:  Your physician recommends that you continue on your current medications as directed. Please refer to the Current Medication list given to you today.  *If you need a refill on your cardiac medications before your next appointment, please call your pharmacy*   Lab Work: Your physician recommends that you have the following labs drawn today: CBC, BNP and Lipids.  If you have labs (blood work) drawn today and your tests are completely normal, you will receive your results only by: [place] (if you have MyChart) OR A paper copy in the mail If you have any lab test that is abnormal or we need to change your treatment, we will call you to review the results.   Testing/Procedures: NONE   Follow-Up: At [facility], you and your health needs are our priority.  As part of our continuing mission to provide you with exceptional heart care, we have created designated Provider Care Teams.  These Care Teams include your primary Cardiologist (physician) and Advanced Practice Providers (APPs -  Physician Assistants and Nurse Practitioners) who all work together to provide you with the care you need, when you need it.  We recommend signing up for the patient portal called "MyChart".  Sign up information is provided on this After Visit Summary.  MyChart is used to connect with patients for Virtual Visits (Telemedicine).  Patients are able to view lab/test results, encounter notes, upcoming appointments, etc.  Non-urgent messages can be sent to your provider as well.   To learn more about what you can do with MyChart, go to [email or web address].    Your next appointment:   1 year(s)  The format for your next appointment:   In Person  Provider:   Sanda Klein, MD

## 2022-05-08 NOTE — Progress Notes (Signed)
Cardiology Office Note:    Date:  05/12/2022   ID:  [name], DOB [birth year], MRN [id number]  PCP:  Clovia Cuff, MD   [place] [facility] Cardiologist:  Sanda Klein, MD     Referring MD: Clovia Cuff, MD   Chief Complaint  Patient presents with   Follow-up    History of Present Illness:    Brandon Mason is a 88 y.o. male recently hospitalized with symptoms of chest pain in the setting of severe anemia (hemoglobin as low as 5.5, received 2 units PRBC) due to GI bleed, with elevation in high-sensitivity troponin high to a peak of 2163.  He was found to have esophageal ulcers, although there was no evidence of ongoing bleeding by EGD.  Additional medical problems include hypertension, type 2 diabetes mellitus, obstructive sleep apnea, remote history of ischemic stroke, moderate bilateral carotid artery stenosis, 75% right subclavian artery stenosis, remote history of DVT (no longer on anticoagulation), history of herniated cervical disc with myelopathy status post C3-C4 decompression.  His chest pain resolved as soon as he received transfusions at the hospital and has not recurred since, despite the fact that he is reasonably physically active for 88 year old.  He denies any problems with shortness of breath.  He has not had orthopnea, PND or lower extremity edema.  He denies dizziness, syncope, focal neurological complaints or claudication.  He continues to underlying the fact that he prefers noninvasive, nonsurgical evaluation and treatment.  He definitely feels stronger, more energetic than he did during his hospitalization.  Hemoglobin at hospital discharge was 8.0.  We rechecked it today and it is up to 12.0.  Past Medical History:  Diagnosis Date   Abnormality of gait 02/09/2013   Arthritis    Cancer (HCC)    basal cell skin cancer   Cardiomegaly    Chronic kidney disease    BPH   Chronic leg pain    Hypersomnia    Hypertension    Hypoxia     pulmonary   Neuromuscular disorder (HCC)    restless leg syndrome    Nocturia    Prostate disorder    Restless legs syndrome (RLS) 02/09/2013   Restless legs syndrome with nocturnal myoclonus    RLS (restless legs syndrome)    x 40 years   Shortness of breath    with exertion    Sleep apnea    uses cpap   Stroke ([place])    minor stroke in 2003 - no lasting side effects   UARS (upper airway resistance syndrome) 06/28/2014    Past Surgical History:  Procedure Laterality Date   ANTERIOR CERVICAL DECOMP/DISCECTOMY FUSION N/A 03/29/2016   Procedure: ANTERIOR CERVICAL DECOMPRESSION/DISCECTOMY FUSION CERVICAL THREE- CERVICAL FOUR;  Surgeon: Ashok Pall, MD;  Location: [place];  Service: Neurosurgery;  Laterality: N/A;   BACK SURGERY  2003   L4-5   CATARACT EXTRACTION Bilateral    ESOPHAGOGASTRODUODENOSCOPY (EGD) WITH PROPOFOL N/A 03/17/2022   Procedure: ESOPHAGOGASTRODUODENOSCOPY (EGD) WITH PROPOFOL;  Surgeon: Sharyn Creamer, MD;  Location: [facility];  Service: Gastroenterology;  Laterality: N/A;   HERNIA REPAIR     inguinal hernia   TRANSURETHRAL PROSTATECTOMY WITH GYRUS INSTRUMENTS  04/28/2012   Procedure: TRANSURETHRAL PROSTATECTOMY WITH GYRUS INSTRUMENTS;  Surgeon: Fredricka Bonine, MD;  Location: [facility];  Service: Urology;  Laterality: N/A;   TRANSURETHRAL RESECTION OF PROSTATE  03/24/2012   Procedure: TRANSURETHRAL RESECTION OF THE PROSTATE (TURP);  Surgeon: Fredricka Bonine, MD;  Location: [facility];  Service: Urology;  Laterality: N/A;  with gyrus ,Greenlight PVP laser of Prostate    Current Medications: Current Meds  Medication Sig   diclofenac Sodium (VOLTAREN) 1 % GEL Apply 2 g topically every other day.   ferrous sulfate 325 (65 FE) MG EC tablet Take 1 tablet by mouth daily.   finasteride (PROSCAR) 5 MG tablet TAKE 1 TABLET BY MOUTH  DAILY (Patient taking differently: Take 5 mg by mouth daily.)   furosemide (LASIX) 20 MG tablet Take 1 tablet (20 mg total)  by mouth daily.   mupirocin ointment (BACTROBAN) 2 % APPLY  OINTMENT TOPICALLY TO AFFECTED AREA TWICE DAILY   nystatin cream (MYCOSTATIN) APPLY  CREAM TOPICALLY TO AFFECTED AREA TWICE DAILY   OVER THE COUNTER MEDICATION Take 1 tablet by mouth 2 (two) times daily. MetaNx vitamin/supplement   pantoprazole (PROTONIX) 40 MG tablet Take 1 tablet (40 mg total) by mouth 2 (two) times daily before a meal.   pregabalin (LYRICA) 150 MG capsule Take 150 mg by mouth 2 (two) times daily.   rOPINIRole (REQUIP) 4 MG tablet TAKE 1 TABLET BY MOUTH AT  BEDTIME (Patient taking differently: Take 2 mg by mouth See admin instructions. Take 1/2 tablet at noon and 1/2 tablet at at night (10pm))   senna-docusate (SENOKOT-S) 8.6-50 MG tablet Take 2 tablets by mouth 2 (two) times daily.   traMADol (ULTRAM) 50 MG tablet TAKE 1 TABLET BY MOUTH EVERY 8 HOURS AS NEEDED FOR SEVERE PAIN (Patient taking differently: Take 50 mg by mouth See admin instructions. Take 1 tablet by mouth every 4 to 6 hours for severe pain. May take 1 additional tablet at night if needed 0700, 1200, 1700, 2200)     Allergies:   Patient has no known allergies.   Social History   Socioeconomic History   Marital status: Married    Spouse name: [name]   Number of children: 5   Years of education: 12   Highest education level: Not on file  Occupational History   Occupation: retired     Comment: [name] for a [employer]  Tobacco Use   Smoking status: Never   Smokeless tobacco: Never  Substance and Sexual Activity   Alcohol use: Yes    Alcohol/week: 2.0 standard drinks of alcohol    Types: 2 Cans of beer per week    Comment: weekly   Drug use: No   Sexual activity: Not on file  Other Topics Concern   Not on file  Social History Narrative   Patient lives at home with his wife [name].   Patient is retired.    Patient has 5 children.   Patient has a high school education.   Patient is right-handed.   Patient drinks very little caffeine.    Social Determinants of Health   Financial Resource Strain: Not on file  Food Insecurity: Not on file  Transportation Needs: Not on file  Physical Activity: Not on file  Stress: Not on file  Social Connections: Not on file     Family History: The patient's family history includes Restless legs syndrome in his daughter and mother. There is no history of Esophageal cancer, Colon cancer, Pancreatic cancer, or Stomach cancer.  ROS:   Please see the history of present illness.     All other systems reviewed and are negative.  EKGs/Labs/Other Studies Reviewed:    The following studies were reviewed today: 09/16/2019 TTE   IMPRESSIONS    1. Left ventricular ejection fraction, by estimation, is  60 to 65%. The  left ventricle has normal function. The left ventricle has no regional  wall motion abnormalities.   2. structure. Aortic valve regurgitation is not visualized. No aortic  stenosis is present.   3. There is moderate dilatation of the ascending aorta measuring 42 mm.   EKG:  EKG is not ordered today.  The ekg ordered during his recent hospitalization on 03/17/2019 from demonstrates normal sinus rhythm, right bundle branch block, left anterior fascicular block, first-degree AV block (trifascicular block).  No acute ischemic repolarization abnormalities.  Recent Labs: 03/17/2022: ALT 17; Magnesium 2.1 03/18/2022: BUN 33; Creatinine, Ser 1.04; Potassium 3.9; Sodium 144 05/08/2022: BNP 84.4; Hemoglobin 12.1; Platelets 245  Recent Lipid Panel    Component Value Date/Time   CHOL 166 05/08/2022 1009   TRIG 49 05/08/2022 1009   HDL 72 05/08/2022 1009   CHOLHDL 2.3 05/08/2022 1009   CHOLHDL 3.0 03/17/2022 0731   VLDL 18 03/17/2022 0731   LDLCALC 84 05/08/2022 1009     Risk Assessment/Calculations:       Physical Exam:    VS:  BP 135/72   Pulse 76   Ht '5\' 8"'$  (1.727 m)   Wt 77.9 kg   SpO2 94%   BMI 26.12 kg/m     Wt Readings from Last 3 Encounters:  05/08/22 77.9 kg   04/30/22 78.9 kg  03/20/22 82.1 kg     GEN: Appears younger than stated age, well nourished, well developed in no acute distress HEENT: Normal NECK: No JVD; No carotid bruits LYMPHATICS: No lymphadenopathy CARDIAC: RRR, no murmurs, rubs, gallops RESPIRATORY:  Clear to auscultation without rales, wheezing or rhonchi  ABDOMEN: Soft, non-tender, non-distended MUSCULOSKELETAL:  No edema; No deformity  SKIN: Warm and dry NEUROLOGIC:  Alert and oriented x 3 PSYCHIATRIC:  Normal affect   ASSESSMENT:    1. Coronary artery disease involving native coronary artery of native heart without angina pectoris   2. SOB (shortness of breath)   3. PAD (peripheral artery disease) ([place])   4. Anemia due to GI blood loss    PLAN:    In order of problems listed above:  CAD: He had a small non-STEMI in the setting of severe anemia (hemoglobin 5.5).  But his angina resolved promptly as soon as he received the transfusion and has not recurred since.  He has well-established evidence of atherosclerosis in the peripheral arteries and is highly likely that he has extensive CAD at age 32.  He reiterates desire for the least invasive treatment approach possible.  Since he does not want to have coronary angiography, it does not appear to make any sense for him to have a nuclear perfusion test or a coronary CT angiogram.  Repeat lipid profile today.  He is not on antiplatelet agents since we are waiting for healing of his esophageal ulcers.  Had some evidence of heart failure findings when he was anemic, but currently appears euvolemic.  Normal EF by echo.  We will check a BNP.  Suspect he no longer needs treatment with furosemide. PAD: Previous imaging studies have shown moderate bilateral carotid artery stenosis and a 75% stenosis in the right subclavian artery.  Check his blood pressure only in the left upper extremity. HLP: Lipid parameters when he was hospitalized showed an artificially low cholesterol.  Recheck  labs today show a marginally increased LDL cholesterol of 84, with an excellent HDL of 72.  Role of lipid-lowering agents at age 51 in this asymptomatic gentleman is highly  questionable. Anemia: Hemoglobin back up to 12 today.  Rapid improvement suggest that there is no ongoing bleeding.  Avoid NSAIDs, which may have been the trigger for his problems in the first place.  He is on a PPI.      He does not want to undergo coronary angiography.  We discussed the fact that should he present with an acute myocardial infarction, we should still be able to help with acute interventions, we will hold off any other evaluation at this time.   Medication Adjustments/Labs and Tests Ordered: Current medicines are reviewed at length with the patient today.  Concerns regarding medicines are outlined above.  Orders Placed This Encounter  Procedures   CBC   B Nat Peptide   Lipid Profile   No orders of the defined types were placed in this encounter.   Patient Instructions  Medication Instructions:  Your physician recommends that you continue on your current medications as directed. Please refer to the Current Medication list given to you today.  *If you need a refill on your cardiac medications before your next appointment, please call your pharmacy*   Lab Work: Your physician recommends that you have the following labs drawn today: CBC, BNP and Lipids.  If you have labs (blood work) drawn today and your tests are completely normal, you will receive your results only by: [place] (if you have MyChart) OR A paper copy in the mail If you have any lab test that is abnormal or we need to change your treatment, we will call you to review the results.   Testing/Procedures: NONE   Follow-Up: At [facility], you and your health needs are our priority.  As part of our continuing mission to provide you with exceptional heart care, we have created designated Provider Care Teams.  These Care  Teams include your primary Cardiologist (physician) and Advanced Practice Providers (APPs -  Physician Assistants and Nurse Practitioners) who all work together to provide you with the care you need, when you need it.  We recommend signing up for the patient portal called "MyChart".  Sign up information is provided on this After Visit Summary.  MyChart is used to connect with patients for Virtual Visits (Telemedicine).  Patients are able to view lab/test results, encounter notes, upcoming appointments, etc.  Non-urgent messages can be sent to your provider as well.   To learn more about what you can do with MyChart, go to [email or web address].    Your next appointment:   1 year(s)  The format for your next appointment:   In Person  Provider:   Sanda Klein, MD      Signed, Sanda Klein, MD  05/12/2022 3:27 PM    [place]

## 2022-05-09 ENCOUNTER — Telehealth: Payer: Self-pay

## 2022-05-09 LAB — CBC
Hematocrit: 36 % — ABNORMAL LOW (ref 37.5–51.0)
Hemoglobin: 12.1 g/dL — ABNORMAL LOW (ref 13.0–17.7)
MCH: 32.3 pg (ref 26.6–33.0)
MCHC: 33.6 g/dL (ref 31.5–35.7)
MCV: 96 fL (ref 79–97)
Platelets: 245 10*3/uL (ref 150–450)
RBC: 3.75 x10E6/uL — ABNORMAL LOW (ref 4.14–5.80)
RDW: 14.9 % (ref 11.6–15.4)
WBC: 6.4 10*3/uL (ref 3.4–10.8)

## 2022-05-09 LAB — LIPID PANEL
Chol/HDL Ratio: 2.3 ratio (ref 0.0–5.0)
Cholesterol, Total: 166 mg/dL (ref 100–199)
HDL: 72 mg/dL (ref >39)
LDL Chol Calc (NIH): 84 mg/dL (ref 0–99)
Triglycerides: 49 mg/dL (ref 0–149)
VLDL Cholesterol Cal: 10 mg/dL (ref 5–40)

## 2022-05-09 LAB — BRAIN NATRIURETIC PEPTIDE: BNP: 84.4 pg/mL (ref 0.0–100.0)

## 2022-05-09 NOTE — Telephone Encounter (Signed)
I spoke to her dad about it at the appointment.  He really does not want to have any type of invasive procedures and is currently symptom-free, so I do not see any reason to pursue the stress testing that we have discussed.  That is an option in the future should he develop symptoms of coronary disease, when he is not anemic

## 2022-05-09 NOTE — Telephone Encounter (Signed)
Spoke to patient's daughter Laren Boom lab results given.She wanted to ask Dr.Croitoru if father needed to have heart test he talked about in hospital.Advised I will send message to him for advice.

## 2022-05-12 ENCOUNTER — Encounter: Payer: Self-pay | Admitting: Cardiovascular Disease

## 2022-05-13 NOTE — Telephone Encounter (Signed)
Spoke to patient's daughter Laren Boom Dr.Croitoru's advice given.

## 2022-08-27 ENCOUNTER — Inpatient Hospital Stay (HOSPITAL_COMMUNITY)
Admission: EM | Admit: 2022-08-27 | Discharge: 2022-09-09 | DRG: 964 | Disposition: A | Discharge status: Skilled Nursing Facility | Payer: Medicare HMO | Attending: Surgery | Admitting: Surgery

## 2022-08-27 ENCOUNTER — Emergency Department (HOSPITAL_COMMUNITY): Payer: Medicare HMO

## 2022-08-27 ENCOUNTER — Encounter (HOSPITAL_COMMUNITY): Payer: Self-pay

## 2022-08-27 ENCOUNTER — Other Ambulatory Visit: Payer: Self-pay

## 2022-08-27 DIAGNOSIS — T1490XA Injury, unspecified, initial encounter: Secondary | ICD-10-CM | POA: Diagnosis present

## 2022-08-27 DIAGNOSIS — S301XXA Contusion of abdominal wall, initial encounter: Secondary | ICD-10-CM | POA: Diagnosis present

## 2022-08-27 DIAGNOSIS — M25562 Pain in left knee: Secondary | ICD-10-CM | POA: Diagnosis present

## 2022-08-27 DIAGNOSIS — K5900 Constipation, unspecified: Secondary | ICD-10-CM | POA: Diagnosis not present

## 2022-08-27 DIAGNOSIS — G8929 Other chronic pain: Secondary | ICD-10-CM | POA: Diagnosis present

## 2022-08-27 DIAGNOSIS — D62 Acute posthemorrhagic anemia: Secondary | ICD-10-CM | POA: Diagnosis not present

## 2022-08-27 DIAGNOSIS — Z66 Do not resuscitate: Secondary | ICD-10-CM | POA: Diagnosis present

## 2022-08-27 DIAGNOSIS — S2242XA Multiple fractures of ribs, left side, initial encounter for closed fracture: Secondary | ICD-10-CM | POA: Diagnosis present

## 2022-08-27 DIAGNOSIS — Z9079 Acquired absence of other genital organ(s): Secondary | ICD-10-CM | POA: Diagnosis not present

## 2022-08-27 DIAGNOSIS — N189 Chronic kidney disease, unspecified: Secondary | ICD-10-CM | POA: Diagnosis present

## 2022-08-27 DIAGNOSIS — Z751 Person awaiting admission to adequate facility elsewhere: Secondary | ICD-10-CM

## 2022-08-27 DIAGNOSIS — Y92410 Unspecified street and highway as the place of occurrence of the external cause: Secondary | ICD-10-CM | POA: Diagnosis not present

## 2022-08-27 DIAGNOSIS — I129 Hypertensive chronic kidney disease with stage 1 through stage 4 chronic kidney disease, or unspecified chronic kidney disease: Secondary | ICD-10-CM | POA: Diagnosis present

## 2022-08-27 DIAGNOSIS — G2581 Restless legs syndrome: Secondary | ICD-10-CM | POA: Diagnosis present

## 2022-08-27 DIAGNOSIS — M25462 Effusion, left knee: Secondary | ICD-10-CM | POA: Diagnosis present

## 2022-08-27 DIAGNOSIS — G4733 Obstructive sleep apnea (adult) (pediatric): Secondary | ICD-10-CM | POA: Diagnosis present

## 2022-08-27 DIAGNOSIS — S358X8A Other specified injury of other blood vessels at abdomen, lower back and pelvis level, initial encounter: Secondary | ICD-10-CM | POA: Diagnosis not present

## 2022-08-27 DIAGNOSIS — Z8673 Personal history of transient ischemic attack (TIA), and cerebral infarction without residual deficits: Secondary | ICD-10-CM

## 2022-08-27 DIAGNOSIS — N400 Enlarged prostate without lower urinary tract symptoms: Secondary | ICD-10-CM | POA: Diagnosis present

## 2022-08-27 DIAGNOSIS — Z9841 Cataract extraction status, right eye: Secondary | ICD-10-CM | POA: Diagnosis not present

## 2022-08-27 DIAGNOSIS — R0902 Hypoxemia: Secondary | ICD-10-CM | POA: Diagnosis present

## 2022-08-27 DIAGNOSIS — Z9842 Cataract extraction status, left eye: Secondary | ICD-10-CM

## 2022-08-27 DIAGNOSIS — S62102A Fracture of unspecified carpal bone, left wrist, initial encounter for closed fracture: Secondary | ICD-10-CM | POA: Diagnosis present

## 2022-08-27 DIAGNOSIS — M1990 Unspecified osteoarthritis, unspecified site: Secondary | ICD-10-CM | POA: Diagnosis present

## 2022-08-27 DIAGNOSIS — Z79899 Other long term (current) drug therapy: Secondary | ICD-10-CM

## 2022-08-27 DIAGNOSIS — S069X9A Unspecified intracranial injury with loss of consciousness of unspecified duration, initial encounter: Secondary | ICD-10-CM | POA: Diagnosis present

## 2022-08-27 DIAGNOSIS — Z85828 Personal history of other malignant neoplasm of skin: Secondary | ICD-10-CM | POA: Diagnosis not present

## 2022-08-27 LAB — URINALYSIS, ROUTINE W REFLEX MICROSCOPIC
Bilirubin Urine: NEGATIVE
Glucose, UA: NEGATIVE mg/dL
Hgb urine dipstick: NEGATIVE
Ketones, ur: NEGATIVE mg/dL
Leukocytes,Ua: NEGATIVE
Nitrite: NEGATIVE
Protein, ur: NEGATIVE mg/dL
Specific Gravity, Urine: 1.015 (ref 1.005–1.030)
pH: 7 (ref 5.0–8.0)

## 2022-08-27 LAB — COMPREHENSIVE METABOLIC PANEL
ALT: 10 U/L (ref 0–44)
AST: 20 U/L (ref 15–41)
Albumin: 3.5 g/dL (ref 3.5–5.0)
Alkaline Phosphatase: 61 U/L (ref 38–126)
Anion gap: 9 (ref 5–15)
BUN: 22 mg/dL (ref 8–23)
CO2: 25 mmol/L (ref 22–32)
Calcium: 8.6 mg/dL — ABNORMAL LOW (ref 8.9–10.3)
Chloride: 107 mmol/L (ref 98–111)
Creatinine, Ser: 0.96 mg/dL (ref 0.61–1.24)
GFR, Estimated: >60 mL/min (ref >60)
Glucose, Bld: 106 mg/dL — ABNORMAL HIGH (ref 70–99)
Potassium: 4.1 mmol/L (ref 3.5–5.1)
Sodium: 141 mmol/L (ref 135–145)
Total Bilirubin: 0.7 mg/dL (ref 0.3–1.2)
Total Protein: 6.6 g/dL (ref 6.5–8.1)

## 2022-08-27 LAB — CBC
HCT: 37.1 % — ABNORMAL LOW (ref 39.0–52.0)
HCT: 33.1 % — ABNORMAL LOW (ref 39.0–52.0)
Hemoglobin: 11.9 g/dL — ABNORMAL LOW (ref 13.0–17.0)
Hemoglobin: 10.5 g/dL — ABNORMAL LOW (ref 13.0–17.0)
MCH: 33.1 pg (ref 26.0–34.0)
MCH: 32.3 pg (ref 26.0–34.0)
MCHC: 32.1 g/dL (ref 30.0–36.0)
MCHC: 31.7 g/dL (ref 30.0–36.0)
MCV: 103.3 fL — ABNORMAL HIGH (ref 80.0–100.0)
MCV: 101.8 fL — ABNORMAL HIGH (ref 80.0–100.0)
Platelets: 211 10*3/uL (ref 150–400)
Platelets: 216 10*3/uL (ref 150–400)
RBC: 3.59 MIL/uL — ABNORMAL LOW (ref 4.22–5.81)
RBC: 3.25 MIL/uL — ABNORMAL LOW (ref 4.22–5.81)
RDW: 14.9 % (ref 11.5–15.5)
RDW: 14.7 % (ref 11.5–15.5)
WBC: 8 10*3/uL (ref 4.0–10.5)
WBC: 13.7 10*3/uL — ABNORMAL HIGH (ref 4.0–10.5)
nRBC: 0 % (ref 0.0–0.2)
nRBC: 0 % (ref 0.0–0.2)

## 2022-08-27 LAB — PROTIME-INR
INR: 1 (ref 0.8–1.2)
INR: 1.1 (ref 0.8–1.2)
Prothrombin Time: 12.9 seconds (ref 11.4–15.2)
Prothrombin Time: 14.2 seconds (ref 11.4–15.2)

## 2022-08-27 LAB — SAMPLE TO BLOOD BANK

## 2022-08-27 LAB — I-STAT CHEM 8, ED
BUN: 27 mg/dL — ABNORMAL HIGH (ref 8–23)
Calcium, Ion: 1.04 mmol/L — ABNORMAL LOW (ref 1.15–1.40)
Chloride: 108 mmol/L (ref 98–111)
Creatinine, Ser: 0.9 mg/dL (ref 0.61–1.24)
Glucose, Bld: 102 mg/dL — ABNORMAL HIGH (ref 70–99)
HCT: 36 % — ABNORMAL LOW (ref 39.0–52.0)
Hemoglobin: 12.2 g/dL — ABNORMAL LOW (ref 13.0–17.0)
Potassium: 4.1 mmol/L (ref 3.5–5.1)
Sodium: 141 mmol/L (ref 135–145)
TCO2: 25 mmol/L (ref 22–32)

## 2022-08-27 LAB — CBG MONITORING, ED: Glucose-Capillary: 99 mg/dL (ref 70–99)

## 2022-08-27 LAB — LACTIC ACID, PLASMA: Lactic Acid, Venous: 0.9 mmol/L (ref 0.5–1.9)

## 2022-08-27 LAB — ETHANOL: Alcohol, Ethyl (B): <10 mg/dL (ref <10)

## 2022-08-27 MED ORDER — FENTANYL CITRATE PF 50 MCG/ML IJ SOSY
PREFILLED_SYRINGE | INTRAMUSCULAR | Status: DC | PRN
  Administered 2022-08-27: 12.5 ug via INTRAVENOUS

## 2022-08-27 MED ORDER — METHOCARBAMOL 1000 MG/10ML IJ SOLN: 500.0000 mg | Freq: Three times a day (TID) | INTRAVENOUS | Status: DC | PRN

## 2022-08-27 MED ORDER — IOHEXOL 350 MG/ML SOLN
75.0000 mL | Freq: Once | INTRAVENOUS | Status: AC | PRN
  Administered 2022-08-27: 75 mL via INTRAVENOUS

## 2022-08-27 MED ORDER — OXYCODONE HCL 5 MG PO TABS
5.0000 mg | ORAL_TABLET | ORAL | Status: DC | PRN
  Administered 2022-08-27 – 2022-09-02 (×15): 5 mg via ORAL
  Filled 2022-08-27 (×15): qty 1

## 2022-08-27 MED ORDER — MELATONIN 3 MG PO TABS
3.0000 mg | ORAL_TABLET | Freq: Every evening | ORAL | Status: DC | PRN
  Administered 2022-08-28 – 2022-09-05 (×7): 3 mg via ORAL
  Filled 2022-08-27 (×7): qty 1

## 2022-08-27 MED ORDER — ONDANSETRON HCL 4 MG/2ML IJ SOLN: 4.0000 mg | Freq: Four times a day (QID) | INTRAMUSCULAR | Status: DC | PRN

## 2022-08-27 MED ORDER — ONDANSETRON 4 MG PO TBDP: 4.0000 mg | ORAL_TABLET | Freq: Four times a day (QID) | ORAL | Status: DC | PRN

## 2022-08-27 MED ORDER — FENTANYL CITRATE (PF) 100 MCG/2ML IJ SOLN
INTRAMUSCULAR | Status: AC
  Filled 2022-08-27: qty 2

## 2022-08-27 MED ORDER — METHOCARBAMOL 500 MG PO TABS
500.0000 mg | ORAL_TABLET | Freq: Three times a day (TID) | ORAL | Status: DC | PRN
  Administered 2022-08-27 – 2022-09-09 (×7): 500 mg via ORAL
  Filled 2022-08-27 (×7): qty 1

## 2022-08-27 MED ORDER — HYDROMORPHONE HCL 1 MG/ML IJ SOLN: 1.0000 mg | Freq: Once | INTRAMUSCULAR | Status: DC

## 2022-08-27 MED ORDER — PANTOPRAZOLE SODIUM 40 MG IV SOLR
40.0000 mg | Freq: Every day | INTRAVENOUS | Status: DC
  Administered 2022-08-27: 40 mg via INTRAVENOUS
  Filled 2022-08-27: qty 10

## 2022-08-27 MED ORDER — MORPHINE SULFATE (PF) 4 MG/ML IV SOLN: 4.0000 mg | Freq: Once | INTRAVENOUS | Status: DC

## 2022-08-27 MED ORDER — SODIUM CHLORIDE 0.9 % IV SOLN: INTRAVENOUS | Status: DC

## 2022-08-27 MED ORDER — FENTANYL CITRATE PF 50 MCG/ML IJ SOSY
25.0000 ug | PREFILLED_SYRINGE | INTRAMUSCULAR | Status: DC | PRN
  Administered 2022-08-27 – 2022-09-08 (×3): 25 ug via INTRAVENOUS
  Filled 2022-08-27 (×4): qty 1

## 2022-08-27 MED ORDER — ESMOLOL HCL-SODIUM CHLORIDE 2000 MG/100ML IV SOLN
25.0000 ug/kg/min | INTRAVENOUS | Status: DC
  Administered 2022-08-27: 25 ug/kg/min via INTRAVENOUS
  Administered 2022-08-27: 50 ug/kg/min via INTRAVENOUS
  Administered 2022-08-27: 100 ug/kg/min via INTRAVENOUS
  Administered 2022-08-28: 200 ug/kg/min via INTRAVENOUS
  Administered 2022-08-28 (×2): 100 ug/kg/min via INTRAVENOUS
  Filled 2022-08-27 (×8): qty 100

## 2022-08-27 MED ORDER — FENTANYL CITRATE PF 50 MCG/ML IJ SOSY: 12.5000 ug | PREFILLED_SYRINGE | Freq: Once | INTRAMUSCULAR | Status: DC

## 2022-08-27 MED ORDER — PANTOPRAZOLE SODIUM 40 MG PO TBEC: 40.0000 mg | DELAYED_RELEASE_TABLET | Freq: Every day | ORAL | Status: DC

## 2022-08-27 MED ORDER — TRAMADOL HCL 50 MG PO TABS
50.0000 mg | ORAL_TABLET | Freq: Four times a day (QID) | ORAL | Status: DC | PRN
  Administered 2022-08-27 – 2022-08-28 (×3): 100 mg via ORAL
  Administered 2022-08-28 (×3): 50 mg via ORAL
  Administered 2022-08-29 – 2022-09-01 (×5): 100 mg via ORAL
  Administered 2022-09-01: 50 mg via ORAL
  Administered 2022-09-02 (×2): 100 mg via ORAL
  Filled 2022-08-27 (×4): qty 2
  Filled 2022-08-27: qty 1
  Filled 2022-08-27 (×2): qty 2
  Filled 2022-08-27: qty 1
  Filled 2022-08-27 (×6): qty 2
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
## 2022-08-27 NOTE — ED Notes (Signed)
Report called to Joelene Millin, RN on 4[facility] ICU
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
## 2022-08-27 NOTE — Progress Notes (Signed)
Patient ID: Brandon Mason, male   DOB: 1926/02/25, 89 y.o.   MRN: [id number] Per patient request I called his son at [phone] and left VM. I also called his [employer], [employer] in [place] and left a message.  Georganna Skeans, MD, MPH, FACS Please use [email or web address] to contact on call provider

## 2022-08-27 NOTE — ED Notes (Signed)
Patient transported to CT with TRN.  
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
## 2022-08-28 ENCOUNTER — Inpatient Hospital Stay (HOSPITAL_COMMUNITY): Payer: Medicare HMO

## 2022-08-28 LAB — BASIC METABOLIC PANEL
Anion gap: 4 — ABNORMAL LOW (ref 5–15)
BUN: 21 mg/dL (ref 8–23)
CO2: 27 mmol/L (ref 22–32)
Calcium: 7.8 mg/dL — ABNORMAL LOW (ref 8.9–10.3)
Chloride: 105 mmol/L (ref 98–111)
Creatinine, Ser: 0.98 mg/dL (ref 0.61–1.24)
GFR, Estimated: >60 mL/min (ref >60)
Glucose, Bld: 104 mg/dL — ABNORMAL HIGH (ref 70–99)
Potassium: 4.4 mmol/L (ref 3.5–5.1)
Sodium: 136 mmol/L (ref 135–145)

## 2022-08-28 LAB — CBC
HCT: 26.9 % — ABNORMAL LOW (ref 39.0–52.0)
Hemoglobin: 8.8 g/dL — ABNORMAL LOW (ref 13.0–17.0)
MCH: 33.1 pg (ref 26.0–34.0)
MCHC: 32.7 g/dL (ref 30.0–36.0)
MCV: 101.1 fL — ABNORMAL HIGH (ref 80.0–100.0)
Platelets: 198 10*3/uL (ref 150–400)
RBC: 2.66 MIL/uL — ABNORMAL LOW (ref 4.22–5.81)
RDW: 14.6 % (ref 11.5–15.5)
WBC: 8 10*3/uL (ref 4.0–10.5)
nRBC: 0 % (ref 0.0–0.2)

## 2022-08-28 MED ORDER — CALCIUM GLUCONATE-NACL 2-0.675 GM/100ML-% IV SOLN
2.0000 g | Freq: Once | INTRAVENOUS | Status: AC
  Administered 2022-08-28: 2000 mg via INTRAVENOUS
  Filled 2022-08-28: qty 100

## 2022-08-28 MED ORDER — PREGABALIN 75 MG PO CAPS
150.0000 mg | ORAL_CAPSULE | Freq: Two times a day (BID) | ORAL | Status: DC
  Administered 2022-08-28 – 2022-09-02 (×11): 150 mg via ORAL
  Filled 2022-08-28 (×11): qty 2

## 2022-08-28 MED ORDER — IOHEXOL 350 MG/ML SOLN
100.0000 mL | Freq: Once | INTRAVENOUS | Status: AC | PRN
  Administered 2022-08-28: 100 mL via INTRAVENOUS

## 2022-08-28 MED ORDER — FUROSEMIDE 20 MG PO TABS
20.0000 mg | ORAL_TABLET | Freq: Every day | ORAL | Status: DC
  Administered 2022-08-28 – 2022-09-09 (×13): 20 mg via ORAL
  Filled 2022-08-28 (×13): qty 1

## 2022-08-28 MED ORDER — CHLORHEXIDINE GLUCONATE CLOTH 2 % EX PADS
6.0000 | MEDICATED_PAD | Freq: Every day | CUTANEOUS | Status: DC
  Administered 2022-08-28 – 2022-09-09 (×11): 6 via TOPICAL

## 2022-08-28 MED ORDER — PANTOPRAZOLE SODIUM 40 MG PO TBEC
40.0000 mg | DELAYED_RELEASE_TABLET | Freq: Two times a day (BID) | ORAL | Status: DC
  Administered 2022-08-28 – 2022-09-09 (×24): 40 mg via ORAL
  Filled 2022-08-28 (×24): qty 1

## 2022-08-28 MED ORDER — ROPINIROLE HCL 1 MG PO TABS
1.0000 mg | ORAL_TABLET | Freq: Two times a day (BID) | ORAL | Status: DC
  Filled 2022-08-28: qty 1

## 2022-08-28 MED ORDER — ROPINIROLE HCL 1 MG PO TABS
1.0000 mg | ORAL_TABLET | Freq: Two times a day (BID) | ORAL | Status: DC
  Administered 2022-08-28 – 2022-08-31 (×8): 1 mg via ORAL
  Filled 2022-08-28 (×8): qty 1

## 2022-08-28 MED ORDER — FINASTERIDE 5 MG PO TABS
5.0000 mg | ORAL_TABLET | Freq: Every day | ORAL | Status: DC
  Administered 2022-08-28 – 2022-09-09 (×13): 5 mg via ORAL
  Filled 2022-08-28 (×13): qty 1
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
## 2022-08-28 NOTE — TOC CAGE-AID Note (Signed)
Transition of Care [facility]) - CAGE-AID Screening  Patient Details  Name: Brandon Mason MRN: [id number] Date of Birth: 05/11/1926  Clinical Narrative:  Patient denies any alcohol or drug use, no need for substance abuse resources at this time.  CAGE-AID Screening:   Have You Ever Felt You Ought to Cut Down on Your Drinking or Drug Use?: No Have People Annoyed You By Critizing Your Drinking Or Drug Use?: No Have You Felt Bad Or Guilty About Your Drinking Or Drug Use?: No Have You Ever Had a Drink or Used Drugs First Thing In The Morning to Steady Your Nerves or to Get Rid of a Hangover?: No CAGE-AID Score: 0  Substance Abuse Education Offered: No

## 2022-08-29 ENCOUNTER — Inpatient Hospital Stay (HOSPITAL_COMMUNITY): Payer: Medicare HMO

## 2022-08-29 LAB — CBC
HCT: 26.6 % — ABNORMAL LOW (ref 39.0–52.0)
Hemoglobin: 8.6 g/dL — ABNORMAL LOW (ref 13.0–17.0)
MCH: 33 pg (ref 26.0–34.0)
MCHC: 32.3 g/dL (ref 30.0–36.0)
MCV: 101.9 fL — ABNORMAL HIGH (ref 80.0–100.0)
Platelets: 211 10*3/uL (ref 150–400)
RBC: 2.61 MIL/uL — ABNORMAL LOW (ref 4.22–5.81)
RDW: 14.7 % (ref 11.5–15.5)
WBC: 8.6 10*3/uL (ref 4.0–10.5)
nRBC: 0 % (ref 0.0–0.2)

## 2022-08-29 LAB — BASIC METABOLIC PANEL
Anion gap: 16 — ABNORMAL HIGH (ref 5–15)
BUN: 25 mg/dL — ABNORMAL HIGH (ref 8–23)
CO2: 20 mmol/L — ABNORMAL LOW (ref 22–32)
Calcium: 8.3 mg/dL — ABNORMAL LOW (ref 8.9–10.3)
Chloride: 102 mmol/L (ref 98–111)
Creatinine, Ser: 0.98 mg/dL (ref 0.61–1.24)
GFR, Estimated: >60 mL/min (ref >60)
Glucose, Bld: 114 mg/dL — ABNORMAL HIGH (ref 70–99)
Potassium: 4.4 mmol/L (ref 3.5–5.1)
Sodium: 138 mmol/L (ref 135–145)

## 2022-08-29 NOTE — Progress Notes (Signed)
Orthopedic Tech Progress Note Patient Details:  BOLTON HOUSEHOLDER [birth year] [id number]  Ortho Devices Type of Ortho Device: Sugartong splint, Arm sling Ortho Device/Splint Location: LUE Ortho Device/Splint Interventions: Ordered, Application   Post Interventions Patient Tolerated: Well Instructions Provided: Poper ambulation with device, Care of device  Leticia Coletta A Elson Ulbrich 08/29/2022, 4:40 PM
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
## 2022-08-29 NOTE — Progress Notes (Signed)
Inpatient Rehabilitation Admissions Coordinator   Per therapy recommendations patient was screened for CIR candidacy by Danne Baxter RN MSN. Current payor trends with [employer] are unlikley to approve a CIR/AIR level rehab for this diagnosis. I will not place a Rehab Conuslt at this time. Recommend other rehab venues to be pursued.   Danne Baxter, RN, MSN Rehab Admissions Coordinator [phone] 08/29/2022 4:26 PM
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
## 2022-08-29 NOTE — Consult Note (Signed)
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 fracture or traumatic malalignment. 2. Solid C3-C4 ACDF. Electronically Signed   By: Margaretha Sheffield M.D.   On: 08/27/2022 15:32   CT HEAD WO CONTRAST  Result Date: 08/27/2022 CLINICAL DATA:  Trauma EXAM: CT HEAD WITHOUT CONTRAST TECHNIQUE: Contiguous axial images were obtained from the base of the skull through the vertex without intravenous contrast. RADIATION DOSE REDUCTION: This exam was performed according to the departmental dose-optimization program which includes automated  exposure control, adjustment of the mA and/or kV according to patient size and/or use of iterative reconstruction technique. COMPARISON:  Head CT 02/04/2022 FINDINGS: Brain: No evidence of acute infarction, hemorrhage, hydrocephalus, extra-axial collection or mass lesion/mass effect. There is mild diffuse atrophy. There is mild periventricular white matter hypodensity, likely chronic small vessel ischemic change, similar to prior.  Vascular: Atherosclerotic calcifications are present within the cavernous internal carotid arteries. Skull: Normal. Negative for fracture or focal lesion. Sinuses/Orbits: No acute finding. Other: None. IMPRESSION: 1. No acute intracranial process. 2. Mild atrophy and chronic small vessel ischemic changes. Electronically Signed   By: Ronney Asters M.D.   On: 08/27/2022 15:28   DG Chest Port 1 View  Result Date: 08/27/2022 CLINICAL DATA:  Trauma. EXAM: PORTABLE CHEST 1 VIEW COMPARISON:  Chest radiograph 03/16/2022. FINDINGS: Low lung volumes accentuate the pulmonary vasculature and cardiomediastinal silhouette. Unchanged scarring in the right lower lung. The left costophrenic sulcus and lower left chest wall are obscured by a metallic object external to the patient. At least 3 mildly displaced fractures of the left third, fourth and fifth ribs are seen. No pneumothorax. Stable cardiac and mediastinal contours. IMPRESSION: Mildly displaced fractures of the left third, fourth and fifth ribs. No pneumothorax. Electronically Signed   By: Emmit Alexanders M.D.   On: 08/27/2022 15:03   DG Pelvis Portable  Result Date: 08/27/2022 CLINICAL DATA:  Trauma EXAM: PORTABLE PELVIS 1-2 VIEWS COMPARISON:  None Available. FINDINGS: Portion of the upper pelvis is excluded from the field-of-view. Pelvic ring is intact. Bilateral hip osteoarthritic changes. No fracture identified. IMPRESSION: Bilateral hip osteoarthritis. No acute findings. Evaluation limited by positioning. Electronically Signed   By: Sammie Bench M.D.   On: 08/27/2022 15:02   DG FEMUR MIN 2 VIEWS LEFT  Result Date: 08/27/2022 CLINICAL DATA:  [place].  Left leg pain. EXAM: LEFT FEMUR 2 VIEWS COMPARISON:  None Available. FINDINGS: The hip and knee joints are intact.  No acute femur fracture is. IMPRESSION: No acute bony findings. Electronically Signed   By: Marijo Sanes M.D.   On: 08/27/2022 15:00    Review of Systems  HENT:  Negative for ear discharge,  ear pain, hearing loss and tinnitus.   Eyes:  Negative for photophobia and pain.  Respiratory:  Negative for cough and shortness of breath.   Cardiovascular:  Positive for chest pain.  Gastrointestinal:  Negative for abdominal pain, nausea and vomiting.  Genitourinary:  Negative for dysuria, flank pain, frequency and urgency.  Musculoskeletal:  Positive for arthralgias (Left wrist). Negative for back pain, myalgias and neck pain.  Neurological:  Negative for dizziness and headaches.  Hematological:  Does not bruise/bleed easily.  Psychiatric/Behavioral:  The patient is not nervous/anxious.    Blood pressure (!) 92/53, pulse 81, temperature 97.8 F (36.6 C), resp. rate 15, height '5\' 8"'$  (1.727 m), weight 78.5 kg, SpO2 100 %. Physical Exam Constitutional:      General: He is not in acute distress.    Appearance: He is well-developed. He is not diaphoretic.  HENT:     Head: Normocephalic and atraumatic.  Eyes:     General: No scleral icterus.       Right eye: No discharge.        Left eye: No discharge.     Conjunctiva/sclera: Conjunctivae normal.  Cardiovascular:     Rate and Rhythm: Normal rate and regular rhythm.  Pulmonary:     Effort: Pulmonary effort is normal. No respiratory distress.  Musculoskeletal:  Cervical back: Normal range of motion.     Comments: Left shoulder, elbow, wrist, digits- no skin wounds, wrist mod TTP, no instability, no blocks to motion  Sens  Ax/R/M/U intact  Mot   Ax/ R/ PIN/ M/ AIN/ U intact  Rad 2+  Skin:    General: Skin is warm and dry.  Neurological:     Mental Status: He is alert.  Psychiatric:        Mood and Affect: Mood normal.        Behavior: Behavior normal.     Assessment/Plan: Left wrist fx -- Plan likely non-operative management with splint and NWB. F/u with Dr. Ala Bent in 1-2 weeks.    Lisette Abu, [name] Orthopedic Surgery [phone]
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
## 2022-08-30 LAB — CBC
HCT: 23.2 % — ABNORMAL LOW (ref 39.0–52.0)
Hemoglobin: 7.5 g/dL — ABNORMAL LOW (ref 13.0–17.0)
MCH: 33.2 pg (ref 26.0–34.0)
MCHC: 32.3 g/dL (ref 30.0–36.0)
MCV: 102.7 fL — ABNORMAL HIGH (ref 80.0–100.0)
Platelets: 167 10*3/uL (ref 150–400)
RBC: 2.26 MIL/uL — ABNORMAL LOW (ref 4.22–5.81)
RDW: 14.8 % (ref 11.5–15.5)
WBC: 9.1 10*3/uL (ref 4.0–10.5)
nRBC: 0 % (ref 0.0–0.2)

## 2022-08-30 LAB — BASIC METABOLIC PANEL
Anion gap: 7 (ref 5–15)
BUN: 29 mg/dL — ABNORMAL HIGH (ref 8–23)
CO2: 29 mmol/L (ref 22–32)
Calcium: 8 mg/dL — ABNORMAL LOW (ref 8.9–10.3)
Chloride: 100 mmol/L (ref 98–111)
Creatinine, Ser: 1.01 mg/dL (ref 0.61–1.24)
GFR, Estimated: >60 mL/min (ref >60)
Glucose, Bld: 116 mg/dL — ABNORMAL HIGH (ref 70–99)
Potassium: 4.4 mmol/L (ref 3.5–5.1)
Sodium: 136 mmol/L (ref 135–145)

## 2022-08-30 MED ORDER — GUAIFENESIN ER 600 MG PO TB12
600.0000 mg | ORAL_TABLET | Freq: Two times a day (BID) | ORAL | Status: DC
  Administered 2022-08-30 – 2022-09-09 (×20): 600 mg via ORAL
  Filled 2022-08-30 (×20): qty 1

## 2022-08-30 MED ORDER — FLUTICASONE PROPIONATE 50 MCG/ACT NA SUSP
1.0000 | Freq: Every day | NASAL | Status: DC
  Administered 2022-08-30 – 2022-09-09 (×11): 1 via NASAL
  Filled 2022-08-30: qty 16
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
## 2022-08-30 NOTE — Progress Notes (Signed)
Patient ID: Brandon Mason, male   DOB: [birth year], 89 y.o.   MRN: [id number] I called his daughter, Laren Boom, and updated her. She requests if anyone needs to be called to use her number [phone].  Georganna Skeans, MD, MPH, FACS Please use [email or web address] to contact on call provider

## 2022-08-30 NOTE — Care Management Important Message (Signed)
Important Message  Patient Details  Name: Brandon Mason MRN: [id number] Date of Birth: 1925-09-17   Medicare Important Message Given:  Yes     Hannah Beat 08/30/2022, 1:49 PM
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
## 2022-08-31 LAB — CBC
HCT: 23.9 % — ABNORMAL LOW (ref 39.0–52.0)
Hemoglobin: 7.6 g/dL — ABNORMAL LOW (ref 13.0–17.0)
MCH: 34.2 pg — ABNORMAL HIGH (ref 26.0–34.0)
MCHC: 31.8 g/dL (ref 30.0–36.0)
MCV: 107.7 fL — ABNORMAL HIGH (ref 80.0–100.0)
Platelets: 181 10*3/uL (ref 150–400)
RBC: 2.22 MIL/uL — ABNORMAL LOW (ref 4.22–5.81)
RDW: 15 % (ref 11.5–15.5)
WBC: 9.5 10*3/uL (ref 4.0–10.5)
nRBC: 0 % (ref 0.0–0.2)

## 2022-08-31 LAB — BASIC METABOLIC PANEL
Anion gap: 9 (ref 5–15)
BUN: 28 mg/dL — ABNORMAL HIGH (ref 8–23)
CO2: 24 mmol/L (ref 22–32)
Calcium: 8.2 mg/dL — ABNORMAL LOW (ref 8.9–10.3)
Chloride: 105 mmol/L (ref 98–111)
Creatinine, Ser: 1.21 mg/dL (ref 0.61–1.24)
GFR, Estimated: 55 mL/min — ABNORMAL LOW (ref >60)
Glucose, Bld: 112 mg/dL — ABNORMAL HIGH (ref 70–99)
Potassium: 3.9 mmol/L (ref 3.5–5.1)
Sodium: 138 mmol/L (ref 135–145)

## 2022-08-31 MED ORDER — SENNOSIDES-DOCUSATE SODIUM 8.6-50 MG PO TABS
1.0000 | ORAL_TABLET | Freq: Every day | ORAL | Status: DC | PRN
  Administered 2022-08-31 – 2022-09-05 (×3): 1 via ORAL
  Filled 2022-08-31 (×3): qty 1

## 2022-08-31 MED ORDER — FERROUS SULFATE 325 (65 FE) MG PO TABS
325.0000 mg | ORAL_TABLET | Freq: Every day | ORAL | Status: DC
  Administered 2022-08-31 – 2022-09-09 (×10): 325 mg via ORAL
  Filled 2022-08-31 (×10): qty 1

## 2022-08-31 MED ORDER — ENOXAPARIN SODIUM 30 MG/0.3ML IJ SOSY
30.0000 mg | PREFILLED_SYRINGE | Freq: Two times a day (BID) | INTRAMUSCULAR | Status: DC
  Administered 2022-08-31 – 2022-09-09 (×19): 30 mg via SUBCUTANEOUS
  Filled 2022-08-31 (×20): qty 0.3
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
## 2022-09-01 ENCOUNTER — Inpatient Hospital Stay (HOSPITAL_COMMUNITY): Payer: Medicare HMO

## 2022-09-01 LAB — CBC
HCT: 24.3 % — ABNORMAL LOW (ref 39.0–52.0)
Hemoglobin: 8.4 g/dL — ABNORMAL LOW (ref 13.0–17.0)
MCH: 35 pg — ABNORMAL HIGH (ref 26.0–34.0)
MCHC: 34.6 g/dL (ref 30.0–36.0)
MCV: 101.3 fL — ABNORMAL HIGH (ref 80.0–100.0)
Platelets: 210 10*3/uL (ref 150–400)
RBC: 2.4 MIL/uL — ABNORMAL LOW (ref 4.22–5.81)
RDW: 14.7 % (ref 11.5–15.5)
WBC: 11.8 10*3/uL — ABNORMAL HIGH (ref 4.0–10.5)
nRBC: 0 % (ref 0.0–0.2)

## 2022-09-01 MED ORDER — POLYETHYLENE GLYCOL 3350 17 G PO PACK
17.0000 g | PACK | Freq: Every day | ORAL | Status: DC
  Administered 2022-09-01 – 2022-09-05 (×4): 17 g via ORAL
  Filled 2022-09-01 (×4): qty 1

## 2022-09-01 MED ORDER — DOCUSATE SODIUM 100 MG PO CAPS
100.0000 mg | ORAL_CAPSULE | Freq: Two times a day (BID) | ORAL | Status: DC
  Administered 2022-09-01 – 2022-09-09 (×16): 100 mg via ORAL
  Filled 2022-09-01 (×17): qty 1

## 2022-09-01 MED ORDER — MAGNESIUM HYDROXIDE 400 MG/5ML PO SUSP
30.0000 mL | Freq: Once | ORAL | Status: AC
  Administered 2022-09-01: 30 mL via ORAL
  Filled 2022-09-01: qty 30

## 2022-09-01 MED ORDER — ROPINIROLE HCL 1 MG PO TABS
1.0000 mg | ORAL_TABLET | Freq: Two times a day (BID) | ORAL | Status: DC
  Administered 2022-09-01 – 2022-09-02 (×3): 1 mg via ORAL
  Filled 2022-09-01 (×3): qty 1

## 2022-09-01 MED ORDER — IOHEXOL 350 MG/ML SOLN
100.0000 mL | Freq: Once | INTRAVENOUS | Status: AC | PRN
  Administered 2022-09-01: 100 mL via INTRAVENOUS
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
## 2022-09-02 ENCOUNTER — Inpatient Hospital Stay (HOSPITAL_COMMUNITY): Payer: Medicare HMO

## 2022-09-02 LAB — BASIC METABOLIC PANEL
Anion gap: 5 (ref 5–15)
BUN: 25 mg/dL — ABNORMAL HIGH (ref 8–23)
CO2: 29 mmol/L (ref 22–32)
Calcium: 8 mg/dL — ABNORMAL LOW (ref 8.9–10.3)
Chloride: 100 mmol/L (ref 98–111)
Creatinine, Ser: 1.01 mg/dL (ref 0.61–1.24)
GFR, Estimated: >60 mL/min (ref >60)
Glucose, Bld: 124 mg/dL — ABNORMAL HIGH (ref 70–99)
Potassium: 4.4 mmol/L (ref 3.5–5.1)
Sodium: 134 mmol/L — ABNORMAL LOW (ref 135–145)

## 2022-09-02 MED ORDER — POLYETHYLENE GLYCOL 3350 17 G PO PACK: 17.0000 g | PACK | Freq: Every day | ORAL | 0 refills | Status: AC | PRN

## 2022-09-02 MED ORDER — PREGABALIN 75 MG PO CAPS
150.0000 mg | ORAL_CAPSULE | Freq: Two times a day (BID) | ORAL | Status: DC
  Administered 2022-09-02 – 2022-09-09 (×14): 150 mg via ORAL
  Filled 2022-09-02 (×14): qty 2

## 2022-09-02 MED ORDER — GUAIFENESIN ER 600 MG PO TB12: 600.0000 mg | ORAL_TABLET | Freq: Two times a day (BID) | ORAL | Status: DC

## 2022-09-02 MED ORDER — FUROSEMIDE 10 MG/ML IJ SOLN
40.0000 mg | Freq: Once | INTRAMUSCULAR | Status: AC
  Administered 2022-09-02: 40 mg via INTRAVENOUS
  Filled 2022-09-02: qty 4

## 2022-09-02 MED ORDER — TRAMADOL HCL 50 MG PO TABS
50.0000 mg | ORAL_TABLET | Freq: Four times a day (QID) | ORAL | Status: DC
  Administered 2022-09-02 – 2022-09-09 (×29): 50 mg via ORAL
  Filled 2022-09-02 (×29): qty 1

## 2022-09-02 MED ORDER — SODIUM CHLORIDE 0.9 % IV SOLN: 250.0000 mL | INTRAVENOUS | Status: DC | PRN

## 2022-09-02 MED ORDER — SODIUM CHLORIDE 0.9% FLUSH
3.0000 mL | Freq: Two times a day (BID) | INTRAVENOUS | Status: DC
  Administered 2022-09-02 – 2022-09-09 (×14): 3 mL via INTRAVENOUS

## 2022-09-02 MED ORDER — METHOCARBAMOL 500 MG PO TABS: 500.0000 mg | ORAL_TABLET | Freq: Three times a day (TID) | ORAL | 0 refills | Status: DC | PRN

## 2022-09-02 MED ORDER — ROPINIROLE HCL 1 MG PO TABS
1.0000 mg | ORAL_TABLET | Freq: Two times a day (BID) | ORAL | Status: DC
  Administered 2022-09-02 – 2022-09-05 (×7): 1 mg via ORAL
  Filled 2022-09-02 (×8): qty 1

## 2022-09-02 MED ORDER — SODIUM CHLORIDE 0.9% FLUSH: 3.0000 mL | INTRAVENOUS | Status: DC | PRN

## 2022-09-02 MED ORDER — OXYCODONE HCL 5 MG PO TABS
2.5000 mg | ORAL_TABLET | ORAL | Status: DC | PRN
  Administered 2022-09-03 – 2022-09-09 (×8): 2.5 mg via ORAL
  Filled 2022-09-02 (×8): qty 1

## 2022-09-02 MED ORDER — DOCUSATE SODIUM 100 MG PO CAPS: 100.0000 mg | ORAL_CAPSULE | Freq: Two times a day (BID) | ORAL | 0 refills | Status: AC

## 2022-09-02 NOTE — TOC Progression Note (Signed)
Transition of Care [facility]) - Progression Note    Patient Details  Name: Brandon Mason MRN: [id number] Date of Birth: 07/30/1925  Transition of Care [facility]) CM/SW Contact  Ella Bodo, RN Phone Number: 09/02/2022, 11:55am  Clinical Narrative:    Met with patient and multiple family members in room, including daughter Hildred Laser.  Bed offers given to pt/family; they [facility] for [facility] term [facility].  Notified [facility] in admissions of bed acceptance; facility to initiate insurance authorization.  Will follow with updates as available.    Expected Discharge Plan: Skilled Nursing Facility Barriers to Discharge: Insurance Authorization  Expected Discharge Plan and Services   Discharge Planning Services: CM Consult Post Acute Care Choice: [place] Living arrangements for the past 2 months: Single Family Home                                       Social Determinants of Health (SDOH) Interventions SDOH Screenings   Food Insecurity: No Food Insecurity (09/02/2022)  Housing: Low Risk  (09/02/2022)  Transportation Needs: No Transportation Needs (09/02/2022)  Utilities: Not At Risk (09/02/2022)  Depression (PHQ2-9): Low Risk  (08/09/2020)  Tobacco Use: Low Risk  (08/27/2022)    Readmission Risk Interventions     No data to display         Reinaldo Raddle, RN, BSN  Trauma/Neuro ICU Case Manager [phone]
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
## 2022-09-03 LAB — BASIC METABOLIC PANEL
Anion gap: 13 (ref 5–15)
BUN: 31 mg/dL — ABNORMAL HIGH (ref 8–23)
CO2: 24 mmol/L (ref 22–32)
Calcium: 8.2 mg/dL — ABNORMAL LOW (ref 8.9–10.3)
Chloride: 99 mmol/L (ref 98–111)
Creatinine, Ser: 1.05 mg/dL (ref 0.61–1.24)
GFR, Estimated: >60 mL/min (ref >60)
Glucose, Bld: 118 mg/dL — ABNORMAL HIGH (ref 70–99)
Potassium: 4.3 mmol/L (ref 3.5–5.1)
Sodium: 136 mmol/L (ref 135–145)
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
## 2022-09-03 NOTE — TOC Progression Note (Addendum)
Transition of Care [facility]) - Progression Note    Patient Details  Name: AMELIO KALKOWSKI MRN: [id number] Date of Birth: Oct 03, 1925  [facility] CM/SW Contact  Oren Section Cleta Alberts, RN Phone Number: 09/03/2022, 11:50 AM  Clinical Narrative:    Met with daughter Helene Kelp, per her request: She apparently toured [facility], and was not happy with the facility.  She would like to consider [facility] in [place] for ST-rehab.  Referral faxed to [facility] admissions; left message for Romero Liner in admissions requesting call back.  Will provide updates as available.   Addendum: 130pm Patient has been accepted by [facility] SNF in [place], per [place] in admissions.  Facility will submit for insurance authorization today.  Updated daughter, Katrine Coho; she is pleased with this news.   Expected Discharge Plan: Skilled Nursing Facility Barriers to Discharge: Insurance Authorization  Expected Discharge Plan and Services   Discharge Planning Services: CM Consult Post Acute Care Choice: [place] Living arrangements for the past 2 months: Single Family Home                                       Social Determinants of Health (SDOH) Interventions SDOH Screenings   Food Insecurity: No Food Insecurity (09/02/2022)  Housing: Low Risk  (09/02/2022)  Transportation Needs: No Transportation Needs (09/02/2022)  Utilities: Not At Risk (09/02/2022)  Depression (PHQ2-9): Low Risk  (08/09/2020)  Tobacco Use: Low Risk  (08/27/2022)    Readmission Risk Interventions     No data to display         Reinaldo Raddle, RN, BSN  Trauma/Neuro ICU Case Manager [phone]

## 2022-09-03 NOTE — Care Management Important Message (Signed)
Important Message  Patient Details  Name: Brandon Mason MRN: [id number] Date of Birth: [birth year]   Medicare Important Message Given:  Yes     Hannah Beat 09/03/2022, 2:05 PM
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
## 2022-09-04 NOTE — Progress Notes (Addendum)
Mobility Specialist Progress Note   09/04/22 1628  Mobility  Activity Ambulated with assistance in hallway;Ambulated with assistance in room (in recliner before and after)  Level of Assistance Minimal assist, patient does 75% or more (+2 for line managment)  Assistive Device Front wheel walker  Distance Ambulated (ft) 50 ft  Range of Motion/Exercises Active;All extremities  LUE Weight Bearing NWB  Activity Response Tolerated well   Patient received in recliner, eager to participate. Required min-mod A to stand, ambulated short distance outside of room with min A to manage/navigate RW. Needed occasional cues to adhere to NWB on LUE. Returned to room without complaint or incident. Was left in recliner with all needs met, call bell in reach. RN present throughout.  [name] Retal Tonkinson, BS EXP Mobility Specialist Please contact via SecureChat or Rehab office at [phone]
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
## 2022-09-04 NOTE — Plan of Care (Signed)

## 2022-09-04 NOTE — TOC Progression Note (Signed)
Transition of Care [facility]) - Progression Note    Patient Details  Name: Brandon Mason MRN: [id number] Date of Birth: 1925/07/15  Transition of Care [facility]) CM/SW Contact  Oren Section Cleta Alberts, RN Phone Number: 09/04/2022, 3:15 PM  Clinical Narrative:    Telephone call to Romero Liner in admissions at [facility]:  Insurance authorization is still pending; she is hopeful to hear back by the AM.  Will provide updates as available.    Expected Discharge Plan: Skilled Nursing Facility Barriers to Discharge: Insurance Authorization  Expected Discharge Plan and Services   Discharge Planning Services: CM Consult Post Acute Care Choice: [place] Living arrangements for the past 2 months: Single Family Home                                       Social Determinants of Health (SDOH) Interventions SDOH Screenings   Food Insecurity: No Food Insecurity (09/02/2022)  Housing: Low Risk  (09/02/2022)  Transportation Needs: No Transportation Needs (09/02/2022)  Utilities: Not At Risk (09/02/2022)  Depression (PHQ2-9): Low Risk  (08/09/2020)  Tobacco Use: Low Risk  (08/27/2022)    Readmission Risk Interventions     No data to display         Reinaldo Raddle, RN, BSN  Trauma/Neuro ICU Case Manager [phone]

## 2022-09-05 MED ORDER — POLYETHYLENE GLYCOL 3350 17 G PO PACK
17.0000 g | PACK | Freq: Two times a day (BID) | ORAL | Status: DC
  Administered 2022-09-05 – 2022-09-08 (×7): 17 g via ORAL
  Filled 2022-09-05 (×8): qty 1

## 2022-09-05 MED ORDER — BISACODYL 10 MG RE SUPP: 10.0000 mg | Freq: Once | RECTAL | Status: DC
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
## 2022-09-06 ENCOUNTER — Inpatient Hospital Stay (HOSPITAL_COMMUNITY): Payer: Medicare HMO

## 2022-09-06 MED ORDER — NAPHAZOLINE-GLYCERIN 0.012-0.25 % OP SOLN
1.0000 [drp] | Freq: Four times a day (QID) | OPHTHALMIC | Status: DC | PRN
  Administered 2022-09-07 – 2022-09-08 (×2): 2 [drp] via OPHTHALMIC
  Filled 2022-09-06: qty 15

## 2022-09-06 MED ORDER — ROPINIROLE HCL 1 MG PO TABS
2.0000 mg | ORAL_TABLET | Freq: Two times a day (BID) | ORAL | Status: DC
  Administered 2022-09-06 – 2022-09-09 (×7): 2 mg via ORAL
  Filled 2022-09-06 (×9): qty 2

## 2022-09-06 MED ORDER — MAGNESIUM CITRATE PO SOLN
0.5000 | Freq: Once | ORAL | Status: AC
  Administered 2022-09-06: 0.5 via ORAL
  Filled 2022-09-06: qty 296

## 2022-09-06 MED ORDER — ENSURE ENLIVE PO LIQD
237.0000 mL | Freq: Two times a day (BID) | ORAL | Status: DC
  Administered 2022-09-06 – 2022-09-08 (×5): 237 mL via ORAL

## 2022-09-06 NOTE — TOC Progression Note (Signed)
Transition of Care [facility]) - Progression Note    Patient Details  Name: Brandon Mason MRN: [id number] Date of Birth: Oct 22, 1925  Transition [facility]) CM/SW Contact  Ella Bodo, RN Phone Number: 09/06/2022, 1:00pm  Clinical Narrative:    Per Abigail Butts with [facility] admissions, insurance authorization has been received.  Unfortunately, patient with left thigh/knee pain, requiring CT this afternoon.  Facility will not admit over the weekend, per admissions.  Will plan for discharge to SNF on Monday, September 09, 2022, pending medical stability.  Patient and daughter updated, and are appreciative of my visit.   Expected Discharge Plan: Skilled Nursing Facility Barriers to Discharge: Insurance Authorization  Expected Discharge Plan and Services   Discharge Planning Services: CM Consult Post Acute Care Choice: [place] Living arrangements for the past 2 months: Single Family Home                                       Social Determinants of Health (SDOH) Interventions SDOH Screenings   Food Insecurity: No Food Insecurity (09/02/2022)  Housing: Low Risk  (09/02/2022)  Transportation Needs: No Transportation Needs (09/02/2022)  Utilities: Not At Risk (09/02/2022)  Depression (PHQ2-9): Low Risk  (08/09/2020)  Tobacco Use: Low Risk  (08/27/2022)    Readmission Risk Interventions     No data to display         Reinaldo Raddle, RN, BSN  Trauma/Neuro ICU Case Manager [phone]

## 2022-09-06 NOTE — Care Management Important Message (Signed)
Important Message  Patient Details  Name: Brandon Mason MRN: [id number] Date of Birth: 08-Jan-1926   Medicare Important Message Given:  Yes     Hannah Beat 09/06/2022, 12:21 PM
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
## 2022-09-07 MED ORDER — MAGNESIUM CITRATE PO SOLN: 0.5000 | Freq: Once | ORAL | Status: DC | PRN
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
## 2022-09-07 NOTE — Progress Notes (Signed)
Mobility Specialist Progress Note    09/07/22 1236  Mobility  Activity Ambulated with assistance in hallway  Level of Assistance +2 (takes two people) (chair follow)  Assistive Device [employer] Ambulated (ft) 80 ft  LUE Weight Bearing NWB  Activity Response Tolerated well  Mobility Referral Yes  $Mobility charge 1 Mobility   Pre-Mobility: 83 HR, 96% SpO2 Post-Mobility: 83 HR, 96% SpO2  Pt received in chair and agreeable. C/o R ankle pain. Pt required modA+1 to stand and minG during. Pt had increased anterior lean w/ fatigue requiring minA+2 for recovery. Took a seated rest and rolled back to room. Left in chair with call bell in reach and RN present.   Hildred Alamin Mobility Specialist  Please Psychologist, sport and exercise or Rehab Office at [phone]

## 2022-09-08 NOTE — Progress Notes (Signed)
Mobility Specialist Progress Note    09/08/22 1149  Mobility  Activity Ambulated with assistance in hallway  Level of Assistance Other (Comment) (+3, chair follow + safety)  Assistive Device Cane  Distance Ambulated (ft) 100 ft  LUE Weight Bearing NWB  Activity Response Tolerated well  Mobility Referral Yes  $Mobility charge 1 Mobility   Pre-Mobility: 87 HR During Mobility: 112 HR  Pt received in chair and agreeable. C/o stiffness and LUE pain. Pt modA+1 to stand and minA+2 during ambulation. Returned to room in chair and left sitting up with call bell in reach.   Hildred Alamin Mobility Specialist  Please Psychologist, sport and exercise or Rehab Office at [phone]
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
## 2022-09-09 LAB — BASIC METABOLIC PANEL
Anion gap: 10 (ref 5–15)
BUN: 34 mg/dL — ABNORMAL HIGH (ref 8–23)
CO2: 29 mmol/L (ref 22–32)
Calcium: 8.4 mg/dL — ABNORMAL LOW (ref 8.9–10.3)
Chloride: 97 mmol/L — ABNORMAL LOW (ref 98–111)
Creatinine, Ser: 1.09 mg/dL (ref 0.61–1.24)
GFR, Estimated: >60 mL/min (ref >60)
Glucose, Bld: 124 mg/dL — ABNORMAL HIGH (ref 70–99)
Potassium: 4 mmol/L (ref 3.5–5.1)
Sodium: 136 mmol/L (ref 135–145)

## 2022-09-09 LAB — CBC
HCT: 24.2 % — ABNORMAL LOW (ref 39.0–52.0)
Hemoglobin: 7.7 g/dL — ABNORMAL LOW (ref 13.0–17.0)
MCH: 32.6 pg (ref 26.0–34.0)
MCHC: 31.8 g/dL (ref 30.0–36.0)
MCV: 102.5 fL — ABNORMAL HIGH (ref 80.0–100.0)
Platelets: 373 10*3/uL (ref 150–400)
RBC: 2.36 MIL/uL — ABNORMAL LOW (ref 4.22–5.81)
RDW: 14.8 % (ref 11.5–15.5)
WBC: 9.5 10*3/uL (ref 4.0–10.5)
nRBC: 0 % (ref 0.0–0.2)

## 2022-09-09 MED ORDER — PREGABALIN 150 MG PO CAPS: 150.0000 mg | ORAL_CAPSULE | Freq: Two times a day (BID) | ORAL | 0 refills | Status: AC

## 2022-09-09 MED ORDER — POLYETHYLENE GLYCOL 3350 17 G PO PACK: 17.0000 g | PACK | Freq: Two times a day (BID) | ORAL | 0 refills | Status: DC

## 2022-09-09 MED ORDER — TRAMADOL HCL 50 MG PO TABS: 50.0000 mg | ORAL_TABLET | Freq: Four times a day (QID) | ORAL | 0 refills | Status: DC | PRN
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
## 2022-09-09 NOTE — Progress Notes (Signed)
Report called to [place] at [facility]

## 2022-09-09 NOTE — Discharge Instructions (Signed)
Please remain non-weight bearing to left upper extremity until told otherwise by hand surgery.

## 2022-09-09 NOTE — TOC Transition Note (Signed)
Transition of Care [facility]) - CM/SW Discharge Note   Patient Details  Name: Brandon Mason MRN: [id number] Date of Birth: [birth year]  Transition of Care [facility]) CM/SW Contact:  Ella Bodo, RN Phone Number: 09/09/2022, 1001am   Clinical Narrative:    Patient medically stable for dc, and bed available today at [facility], per [place] in admissions.  Will forward discharge summary and narcotic Rx when available. Bedside nurse to call report to [phone].  PTAR notified for transport at 12:22pm.  Daughter at bedside; she is appreciative of assistance with transition to SNF.     Final next level of care: Skilled Nursing Facility Barriers to Discharge: Barriers Resolved   Patient Goals and CMS Choice CMS Medicare.gov Compare Post Acute Care list provided to:: Patient Represenative (must comment) (daughter) Choice offered to / list presented to : Adult Children                        Discharge Plan and Services Additional resources added to the After Visit Summary for     Discharge Planning Services: CM Consult Post Acute Care Choice: [place]                               Social Determinants of Health (SDOH) Interventions SDOH Screenings   Food Insecurity: No Food Insecurity (09/02/2022)  Housing: Low Risk  (09/02/2022)  Transportation Needs: No Transportation Needs (09/02/2022)  Utilities: Not At Risk (09/02/2022)  Depression (PHQ2-9): Low Risk  (08/09/2020)  Tobacco Use: Low Risk  (08/27/2022)     Readmission Risk Interventions     No data to display         Reinaldo Raddle, RN, BSN  Trauma/Neuro ICU Case Manager [phone]

## 2022-10-14 ENCOUNTER — Encounter: Payer: Self-pay | Admitting: *Deleted

## 2023-08-26 ENCOUNTER — Ambulatory Visit: Payer: Medicare HMO | Attending: Cardiovascular Disease | Admitting: Cardiovascular Disease

## 2023-08-26 ENCOUNTER — Encounter: Payer: Self-pay | Admitting: Cardiovascular Disease

## 2023-08-26 VITALS — BP 122/74 | HR 68 | Ht 68.0 in | Wt 181.6 lb

## 2023-08-26 DIAGNOSIS — I739 Peripheral vascular disease, unspecified: Secondary | ICD-10-CM

## 2023-08-26 DIAGNOSIS — D500 Iron deficiency anemia secondary to blood loss (chronic): Secondary | ICD-10-CM

## 2023-08-26 DIAGNOSIS — I10 Essential (primary) hypertension: Secondary | ICD-10-CM | POA: Diagnosis not present

## 2023-08-26 DIAGNOSIS — I2510 Atherosclerotic heart disease of native coronary artery without angina pectoris: Secondary | ICD-10-CM | POA: Diagnosis not present
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
## 2023-08-26 NOTE — Patient Instructions (Signed)
 Medication Instructions:  Lasix 20 mg ( Take 1 Tablet Every Other Day). *If you need a refill on your cardiac medications before your next appointment, please call your pharmacy*   Lab Work: No Labs If you have labs (blood work) drawn today and your tests are completely normal, you will receive your results only by: MyChart Message (if you have MyChart) OR A paper copy in the mail If you have any lab test that is abnormal or we need to change your treatment, we will call you to review the results.   Testing/Procedures: No Testing   Follow-Up: At [facility], you and your health needs are our priority.  As part of our continuing mission to provide you with exceptional heart care, we have created designated Provider Care Teams.  These Care Teams include your primary Cardiologist (physician) and Advanced Practice Providers (APPs -  Physician Assistants and Nurse Practitioners) who all work together to provide you with the care you need, when you need it.  We recommend signing up for the patient portal called "MyChart".  Sign up information is provided on this After Visit Summary.  MyChart is used to connect with patients for Virtual Visits (Telemedicine).  Patients are able to view lab/test results, encounter notes, upcoming appointments, etc.  Non-urgent messages can be sent to your provider as well.   To learn more about what you can do with MyChart, go to [email or web address].    Your next appointment:   1 year(s)  Provider:   Thurmon Fair, MD

## 2024-01-05 NOTE — Progress Notes (Signed)
 Subjective  Patient ID: Brandon Mason is a 89 y.o. male.  Patient is a 89 y.o. male who presents for a new consult today. Hieu has obstructive sleep apnea reportedly for the past 30 years. He cannot remember where he had his sleep study but believes that his sleep apnea was last evaluated and treated with [facility]. Patient typically goes to bed at 11 pm and does fall asleep easily.  He wakes up several time per night for nocturia or pain in legs and can get back to sleep easily.   Patient wakes for the final time around 6 AM and is un refreshed. No AM headache. He does nap. The patient is known to snore and there are witnessed apneas.  There is dry mouth present on awakening. There is no bruxism.  The patient has not had nasal congestion and/or allergy problems. There is not acid reflux during the night.  He is an active sleeper.  There are symptoms of restless legs and chronic pain treated by pain clinic. Previous evaluation and treatment has included C-PAP trial, PSG, and PSG with C PAP titration. He switched from [facility] to hybrid mask which is more comfortable but leaks. He takes off his mask for nocturia and then never puts it back on for sleep. He is on PAP therapy 8-18 cm H2O.  We have compliance data for the past year which shows 4.5 hr/night, 55% over 4 hr/night, 84% overall usage, AHI 7.9 (CAI 4.1). His DME is [employer] in [facility]. Patient has had tonsillectomy but never had sinus or nasal surgery, or sustained a facial or nasal fracture. No sleep paralysis. No cataplexy. Patient reports weight is consistent. Patient drinks 1 caffeinated beverages per day.  Never smoked. Does not drink alcohol. Epworth Sleepiness Scale      01/07/2024  EPWORTH SLEEPINESS SCALE  Sitting and reading 3  Watching TV 3  Sitting, inactive in a public place (e.g. a theatre or a meeting) 3  Lying down to rest in the afternoon when circumstances permit 3  Sitting and talking to someone 2  Sitting  quietly after a lunch without alcohol 3  In a car, while stopped for a few minutes in traffic 2  The Epworth Sleepiness Score is a 19/24.  Less than 7 is considered normal. Greater than 10 is consistent with excessive daytime sleepiness. States the medications make him sleepy. He is on tramadol , pregabalin , and ropinirole  with pain clinic. The patient does not drive.  Reviewed and updated this visit by provider: Tobacco  Allergies  Meds  Problems  Med Hx  Surg Hx  Fam Hx     Review of Systems The following systems are noncontributory unless as described above including constitutional, eyes, ears, nose, mouth, throat, cardiovascular, respiratory, gastrointestinal, genitourinary, integumentary, musculoskeletal, neurological, psychiatric, endocrine, hematologic/lymphatic, and allergic/immunologic.  Objective  BP 128/74 (Patient Position: Sitting)   Ht 5' 8 (1.727 m)   Wt 180 lb (81.6 kg)   BMI 27.37 kg/m   Neck Circumference      01/07/2024  Neck Circumference  Neck Circumference (inches) 17  General: Well-nourished, well developed male in no acute distress. HEENT: Head: Normocephalic, without obvious abnormality, atraumatic. Nasal mucosa normal. Modified Mallampati: 4. no overbite, mild high arched palate, no retrognathia. Moist oral mucosa. Neck is supple.  Cardiac: RRR without MGR, no carotid bruits. Respiratory: Clear to auscultation bilaterally, no wheezes, no rhonchi. Extremities: No cyanosis, clubbing, edema.   Neurologic: Alert, oriented, no involuntary movements, and antalgic gait.  Echocardiogram 03/18/2022: EF 50-55%, right ventricular pressure is 38.3 mmHg, mildly elevated pulmonary artery systolic pressure. Impression   1. OSA on CPAP     Plan  I discussed with Elsie regarding sleep apnea and the signs and symptoms which he may be unaware of which can cause fatigue and interrupted sleep. We talked about risks of untreated sleep apnea including higher risk of heart  attack, stroke, irregular heart beats, high blood pressure, heart failure, mortality rate including motor vehicle accidents.  The patient is using CPAP nightly, remains medically necessary and is benefiting from CPAP therapy.  The current machine is not functioning properly, cannot be repaired and needs to be replaced. His machine is over 29 years old and has reached its lifetime expectancy and reached its limit of usefulness. I will order AutoPAP 4-10 cm H2O with no EPR along with supplies. He understands the dangers of drowsy driving and sedating medications and the patient understands these risks.  Follow up in about 3 months (around 04/08/2024) for review of PAP therapy effectiveness. Risks, benefits, and alternatives of the medications and treatment plan prescribed today were discussed. The patient expressed understanding. Discussed barriers that exist. Documentation for time-based billing:  Total time spent of date of service was 60 minutes.  Patient care activities included preparing to see the patient such as reviewing the patient record, performing a medically appropriate history and physical examination, and counseling and educating the patient, family, and/or caregiver.   Orders Placed This Encounter  Procedures  . PAP (POSITIVE AIRWAY PRESSURE) AMB    Thank you very much for allowing me to participate in this patient's care and please do not hesitate to contact me with questions or concerns.   Omega GORMAN Mutton, DO 01/07/2024, 4:20 PM

## 2024-04-12 NOTE — Progress Notes (Signed)
 Subjective   Chief Complaint  Patient presents with   Sleep Apnea    Brandon Mason is a 89 y.o. Caucasian male with obstructive sleep apnea. Since our last visit Brandon Mason has been using PAP therapy at 4-10 cm of water.  We have compliance data  directly from G3 machine showing approximately 5 hours/day of use, 45 % compliance over 4 hr/night, AHI 2.2, 95th% pressure is 9, average pressure is 6. The patient is keeping up with supplies and has no trouble communicating with the DME supply company. DME: [employer]. His friend drove him today. The patient does not drive. He feels pressure is too high. He does not know how to remove humidifier tray. He liked his old machine more as it was easier to use. His friend mentions that he himself goes to the [facility] and is pleased with care. Brandon Mason is considering transferring care for his sleep to the [facility] as well.  The Epworth Sleepiness Score is a 18/24 (appears alert).  Less than 7 is considered normal. Greater than 10 is consistent with excessive daytime sleepiness.  Sleep study records requested from [facility] but none were provided. He was reportedly diagnosed with obstructive sleep apnea over 30 years ago.   Chronic hypoxemic respiratory failure, coronary arteriosclerosis, history of DVT, HTN, RLS  Past Medical History, Past Surgical History, Family History, Social History, Medications, Problem List and Allergies were reviewed and updated as appropriate. Veteran.  Review of Systems The review of systems remains unchanged unless as described above in the HPI. Objective  Physical Exam: Vitals: BP 124/82   Ht 5' 8 (1.727 m)   Wt 180 lb (81.6 kg)   BMI 27.37 kg/m   General: Well-nourished, well developed male in no acute distress.  Neurologic: alert, clear speech, moves all extremities equally and well, no involuntary movements, gait is antalgic.   Impression  OSA on CPAP  We reviewed the results of the download which shows data  consistent with good response to therapy. Brandon Mason continues to benefit from Autocpap therapy and it remains medically necessary. Plan  I have adjusted PAP therapy from 4-10 down to 4-9 cm water. I demonstrated how to remove tray and he demonstrated successfully how to remove and place humidifier tray. [facility] due to ongoing costs of PAP therapy. In the meantime he will follow up here. I will send an order for new PAP supplies as needed and schedule f/u in 1-2 years or sooner if needed. Refrain from drowsy driving.  Follow up in about 4 months (around 08/14/2024) for review of PAP therapy effectiveness. I answered all questions to the best of my ability and to the patient's apparent satisfaction. Risks, benefits, and alternatives of the medications and treatment plan prescribed today were discussed. The patient expressed understanding. Discussed barriers that exist. Thank you very much for allowing me to participate in this patient's care and please do not hesitate to contact me with questions or concerns.   Brandon GORMAN Mutton, DO 04/13/2024, 10:42 AM

## 2024-06-29 ENCOUNTER — Emergency Department (HOSPITAL_COMMUNITY)

## 2024-06-29 ENCOUNTER — Encounter (HOSPITAL_COMMUNITY): Payer: Self-pay

## 2024-06-29 ENCOUNTER — Inpatient Hospital Stay (HOSPITAL_COMMUNITY)
Admission: EM | Admit: 2024-06-29 | Discharge: 2024-07-03 | DRG: 281 | Disposition: A | Discharge status: Home Health | Attending: Family Medicine | Admitting: Family Medicine

## 2024-06-29 ENCOUNTER — Other Ambulatory Visit: Payer: Self-pay

## 2024-06-29 DIAGNOSIS — Z7982 Long term (current) use of aspirin: Secondary | ICD-10-CM | POA: Diagnosis not present

## 2024-06-29 DIAGNOSIS — Z9981 Dependence on supplemental oxygen: Secondary | ICD-10-CM | POA: Diagnosis not present

## 2024-06-29 DIAGNOSIS — Z79899 Other long term (current) drug therapy: Secondary | ICD-10-CM | POA: Diagnosis not present

## 2024-06-29 DIAGNOSIS — D539 Nutritional anemia, unspecified: Secondary | ICD-10-CM | POA: Diagnosis present

## 2024-06-29 DIAGNOSIS — G8929 Other chronic pain: Secondary | ICD-10-CM | POA: Diagnosis present

## 2024-06-29 DIAGNOSIS — Z79890 Hormone replacement therapy (postmenopausal): Secondary | ICD-10-CM | POA: Diagnosis not present

## 2024-06-29 DIAGNOSIS — I6523 Occlusion and stenosis of bilateral carotid arteries: Secondary | ICD-10-CM | POA: Diagnosis present

## 2024-06-29 DIAGNOSIS — G2581 Restless legs syndrome: Secondary | ICD-10-CM | POA: Diagnosis present

## 2024-06-29 DIAGNOSIS — I160 Hypertensive urgency: Secondary | ICD-10-CM | POA: Diagnosis present

## 2024-06-29 DIAGNOSIS — Z981 Arthrodesis status: Secondary | ICD-10-CM | POA: Diagnosis not present

## 2024-06-29 DIAGNOSIS — E039 Hypothyroidism, unspecified: Secondary | ICD-10-CM | POA: Diagnosis present

## 2024-06-29 DIAGNOSIS — I671 Cerebral aneurysm, nonruptured: Secondary | ICD-10-CM | POA: Diagnosis present

## 2024-06-29 DIAGNOSIS — N400 Enlarged prostate without lower urinary tract symptoms: Secondary | ICD-10-CM | POA: Diagnosis present

## 2024-06-29 DIAGNOSIS — R310 Gross hematuria: Secondary | ICD-10-CM | POA: Diagnosis not present

## 2024-06-29 DIAGNOSIS — Z85828 Personal history of other malignant neoplasm of skin: Secondary | ICD-10-CM | POA: Diagnosis not present

## 2024-06-29 DIAGNOSIS — I708 Atherosclerosis of other arteries: Secondary | ICD-10-CM | POA: Diagnosis present

## 2024-06-29 DIAGNOSIS — Z9841 Cataract extraction status, right eye: Secondary | ICD-10-CM

## 2024-06-29 DIAGNOSIS — R42 Dizziness and giddiness: Secondary | ICD-10-CM | POA: Diagnosis present

## 2024-06-29 DIAGNOSIS — R599 Enlarged lymph nodes, unspecified: Secondary | ICD-10-CM | POA: Diagnosis present

## 2024-06-29 DIAGNOSIS — Z8673 Personal history of transient ischemic attack (TIA), and cerebral infarction without residual deficits: Secondary | ICD-10-CM

## 2024-06-29 DIAGNOSIS — E785 Hyperlipidemia, unspecified: Secondary | ICD-10-CM | POA: Diagnosis present

## 2024-06-29 DIAGNOSIS — I252 Old myocardial infarction: Secondary | ICD-10-CM

## 2024-06-29 DIAGNOSIS — I2510 Atherosclerotic heart disease of native coronary artery without angina pectoris: Secondary | ICD-10-CM | POA: Diagnosis present

## 2024-06-29 DIAGNOSIS — I452 Bifascicular block: Secondary | ICD-10-CM | POA: Diagnosis present

## 2024-06-29 DIAGNOSIS — Z9842 Cataract extraction status, left eye: Secondary | ICD-10-CM

## 2024-06-29 DIAGNOSIS — I6521 Occlusion and stenosis of right carotid artery: Secondary | ICD-10-CM | POA: Diagnosis not present

## 2024-06-29 DIAGNOSIS — J9611 Chronic respiratory failure with hypoxia: Secondary | ICD-10-CM | POA: Diagnosis present

## 2024-06-29 DIAGNOSIS — I493 Ventricular premature depolarization: Secondary | ICD-10-CM | POA: Diagnosis present

## 2024-06-29 DIAGNOSIS — H53149 Visual discomfort, unspecified: Secondary | ICD-10-CM | POA: Diagnosis present

## 2024-06-29 DIAGNOSIS — Z7902 Long term (current) use of antithrombotics/antiplatelets: Secondary | ICD-10-CM | POA: Diagnosis not present

## 2024-06-29 DIAGNOSIS — G4733 Obstructive sleep apnea (adult) (pediatric): Secondary | ICD-10-CM | POA: Diagnosis present

## 2024-06-29 DIAGNOSIS — I2584 Coronary atherosclerosis due to calcified coronary lesion: Secondary | ICD-10-CM | POA: Diagnosis present

## 2024-06-29 DIAGNOSIS — G629 Polyneuropathy, unspecified: Secondary | ICD-10-CM | POA: Diagnosis present

## 2024-06-29 DIAGNOSIS — Z66 Do not resuscitate: Secondary | ICD-10-CM | POA: Diagnosis present

## 2024-06-29 DIAGNOSIS — I10 Essential (primary) hypertension: Secondary | ICD-10-CM | POA: Diagnosis present

## 2024-06-29 DIAGNOSIS — I214 Non-ST elevation (NSTEMI) myocardial infarction: Secondary | ICD-10-CM | POA: Diagnosis present

## 2024-06-29 DIAGNOSIS — I6529 Occlusion and stenosis of unspecified carotid artery: Secondary | ICD-10-CM | POA: Insufficient documentation

## 2024-06-29 DIAGNOSIS — R519 Headache, unspecified: Secondary | ICD-10-CM

## 2024-06-29 DIAGNOSIS — Z8719 Personal history of other diseases of the digestive system: Secondary | ICD-10-CM

## 2024-06-29 DIAGNOSIS — Z9079 Acquired absence of other genital organ(s): Secondary | ICD-10-CM

## 2024-06-29 LAB — TROPONIN T, HIGH SENSITIVITY: Troponin T High Sensitivity: 1130 ng/L (ref 0–19)

## 2024-06-29 LAB — BASIC METABOLIC PANEL WITH GFR
Anion gap: 8 (ref 5–15)
BUN: 34 mg/dL — ABNORMAL HIGH (ref 8–23)
CO2: 30 mmol/L (ref 22–32)
Calcium: 9.3 mg/dL (ref 8.9–10.3)
Chloride: 101 mmol/L (ref 98–111)
Creatinine, Ser: 1.02 mg/dL (ref 0.61–1.24)
GFR, Estimated: >60 mL/min
Glucose, Bld: 83 mg/dL (ref 70–99)
Potassium: 4.3 mmol/L (ref 3.5–5.1)
Sodium: 140 mmol/L (ref 135–145)

## 2024-06-29 LAB — CBC
HCT: 35.9 % — ABNORMAL LOW (ref 39.0–52.0)
Hemoglobin: 11.7 g/dL — ABNORMAL LOW (ref 13.0–17.0)
MCH: 33.4 pg (ref 26.0–34.0)
MCHC: 32.6 g/dL (ref 30.0–36.0)
MCV: 102.6 fL — ABNORMAL HIGH (ref 80.0–100.0)
Platelets: 248 K/uL (ref 150–400)
RBC: 3.5 MIL/uL — ABNORMAL LOW (ref 4.22–5.81)
RDW: 14 % (ref 11.5–15.5)
WBC: 8.2 K/uL (ref 4.0–10.5)
nRBC: 0 % (ref 0.0–0.2)

## 2024-06-29 LAB — PRO BRAIN NATRIURETIC PEPTIDE: Pro Brain Natriuretic Peptide: 2135 pg/mL — ABNORMAL HIGH

## 2024-06-29 MED ORDER — IOHEXOL 350 MG/ML SOLN
75.0000 mL | Freq: Once | INTRAVENOUS | Status: AC | PRN
  Administered 2024-06-29: 75 mL via INTRAVENOUS

## 2024-06-29 MED ORDER — LISINOPRIL 10 MG PO TABS
10.0000 mg | ORAL_TABLET | Freq: Once | ORAL | Status: AC
  Administered 2024-06-29: 10 mg via ORAL
  Filled 2024-06-29: qty 1

## 2024-06-29 MED ORDER — HEPARIN (PORCINE) 25000 UT/250ML-% IV SOLN
1100.0000 [IU]/h | INTRAVENOUS | Status: DC
  Administered 2024-06-29: 950 [IU]/h via INTRAVENOUS
  Filled 2024-06-29: qty 250

## 2024-06-29 MED ORDER — HEPARIN BOLUS VIA INFUSION
4000.0000 [IU] | Freq: Once | INTRAVENOUS | Status: AC
  Administered 2024-06-29: 4000 [IU] via INTRAVENOUS
  Filled 2024-06-29: qty 4000

## 2024-06-29 NOTE — ED Provider Notes (Addendum)
 " [place] EMERGENCY DEPARTMENT AT [facility] Provider Note   CSN: [id number] Arrival date & time: 06/29/24  1035     Patient presents with: Shortness of Breath, Tremors, and Dizziness   Brandon Mason is a 89 y.o. male.  Patient is a 89 year old male with a history of restless leg syndrome, BPH, obstructive sleep apnea on CPAP, NSTEMI, and hypertension presents to the ED with daughter for increased pressure in his head as well as abnormal sensations in his head for the past month.  Daughter notes since Thanksgiving patient has been complaining of feeling swimmy headed and has had increased difficulty walking.  He states he used to walk with a cane but now has to walk with a walker and this is becoming more difficult.  They note symptoms have worsened over the past few days.  Patient states the room is not spinning but when he walks the floor looks like it has waves.  He has not had any falls but feels like he could fall or pass out.  Notes a pressure in his head but denies headache.  Denies vision changes, syncope, numbness/tingling.  He also notes he has had increasing shortness of breath for the past few days as well.  States he is on 3 L of oxygen  at home as needed and has had to wear oxygen  more than normal.  Denies fevers, cough/congestion,, chest pain, abdominal pain, nausea/vomit/diarrhea.    Shortness of Breath Associated symptoms: headaches   Associated symptoms: no abdominal pain, no chest pain, no fever and no vomiting   Dizziness Associated symptoms: headaches, shortness of breath and weakness   Associated symptoms: no chest pain, no nausea and no vomiting        Prior to Admission medications  Medication Sig Start Date End Date Taking? Authorizing Provider  albuterol  (VENTOLIN  HFA) 108 (90 Base) MCG/ACT inhaler Inhale 2 puffs into the lungs every 4 (four) hours as needed. 05/21/24  Yes [provider]  hydrALAZINE  (APRESOLINE ) 50 MG tablet Take 50 mg  by mouth 2 (two) times daily. 05/14/24  Yes [provider]  naloxone (NARCAN) nasal spray 4 mg/0.1 mL    Yes [provider]  nystatin -triamcinolone  (MYCOLOG II) cream Apply 1 Application topically 2 (two) times daily. 09/29/23  Yes [provider]  ofloxacin (OCUFLOX) 0.3 % ophthalmic solution Place 1 drop into the right eye 4 (four) times daily. 06/02/24  Yes [provider]  prednisoLONE acetate (PRED FORTE) 1 % ophthalmic suspension Place 1 drop into the right eye 4 (four) times daily. 06/02/24  Yes [provider]  cholecalciferol (VITAMIN D3) 25 MCG (1000 UNIT) tablet Take 1,000 Units by mouth 2 (two) times daily.    [provider]  docusate sodium  (COLACE) 100 MG capsule Take 1 capsule (100 mg total) by mouth 2 (two) times daily. Patient not taking: Reported on 08/26/2023 09/02/22   Vicci Burnard SAUNDERS, [name]  finasteride  (PROSCAR ) 5 MG tablet TAKE 1 TABLET BY MOUTH  DAILY Patient taking differently: Take 5 mg by mouth daily. 05/03/21   Saguier, [place], [name]  furosemide  (LASIX ) 20 MG tablet Take 20 mg by mouth every other day. Take 1 Tablet Every other Day.    [provider]  levothyroxine  (SYNTHROID ) 25 MCG tablet Take 1 tablet by mouth daily.    [provider]  lisinopril  (ZESTRIL ) 10 MG tablet Take 2 tablets by mouth daily.    [provider]  Multiple Vitamins-Minerals (ONE A DAY MEN 50  PLUS PO) Take 1 tablet by mouth daily at 6 (six) AM.    [provider]  nystatin  cream (MYCOSTATIN ) Apply 1 Application topically 2 (two) times daily.    [provider]  polyethylene glycol (MIRALAX  / GLYCOLAX ) 17 g packet Take 17 g by mouth daily as needed. 09/02/22   Vicci Burnard SAUNDERS, [name]  pregabalin  (LYRICA ) 150 MG capsule Take 1 capsule (150 mg total) by mouth 2 (two) times daily. 09/09/22   [name], [name], [name]  rOPINIRole  (REQUIP ) 1 MG tablet Take 1 mg by mouth at bedtime.    [provider]   senna-docusate (SENOKOT-S) 8.6-50 MG tablet Take 1 tablet by mouth daily as needed for mild constipation or moderate constipation. 03/22/22   [provider]  traMADol  (ULTRAM ) 50 MG tablet Take 1 tablet by mouth 4 (four) times daily. 09/24/22   [provider]    Allergies: Patient has no known allergies.    Review of Systems  Constitutional:  Negative for chills and fever.  Respiratory:  Positive for shortness of breath.   Cardiovascular:  Negative for chest pain.  Gastrointestinal:  Negative for abdominal pain, nausea and vomiting.  Neurological:  Positive for dizziness, tremors, weakness, light-headedness and headaches. Negative for syncope.    Updated Vital Signs BP (!) 183/109   Pulse 81   Temp (!) 97.5 F (36.4 C) (Oral)   Resp 16   Ht 5' 8 (1.727 m)   Wt 81.6 kg   SpO2 93%   BMI 27.37 kg/m   Physical Exam Constitutional:      Appearance: He is well-developed.  HENT:     Head: Normocephalic and atraumatic.  Eyes:     Pupils: Pupils are equal, round, and reactive to light.  Cardiovascular:     Rate and Rhythm: Normal rate.  Pulmonary:     Effort: Pulmonary effort is normal.     Breath sounds: Normal breath sounds.  Abdominal:     General: Bowel sounds are normal.     Palpations: Abdomen is soft.     Tenderness: There is no abdominal tenderness.  Musculoskeletal:        General: Normal range of motion.     Comments: Full range of motion of all 4 remedies with equal strength.  Generalized weakness noted on handgrip.  Skin:    General: Skin is warm and dry.  Neurological:     Mental Status: He is alert and oriented to person, place, and time.     Cranial Nerves: No cranial nerve deficit.  Psychiatric:        Mood and Affect: Mood normal.        Behavior: Behavior normal.     (all labs ordered are listed, but only abnormal results are displayed) Labs Reviewed  BASIC METABOLIC PANEL WITH GFR - Abnormal; Notable for the following  components:      Result Value   BUN 34 (*)    All other components within normal limits  CBC - Abnormal; Notable for the following components:   RBC 3.50 (*)    Hemoglobin 11.7 (*)    HCT 35.9 (*)    MCV 102.6 (*)    All other components within normal limits  PRO BRAIN NATRIURETIC PEPTIDE - Abnormal; Notable for the following components:   Pro Brain Natriuretic Peptide 2,135.0 (*)    All other components within normal limits  TROPONIN T, HIGH SENSITIVITY - Abnormal; Notable for the following components:   Troponin T High Sensitivity  1,130 (*)    All other components within normal limits  URINALYSIS, ROUTINE W REFLEX MICROSCOPIC  HEPARIN  LEVEL (UNFRACTIONATED)  CBC    EKG: EKG Interpretation Date/Time:  Tuesday June 29 2024 22:58:59 EST Ventricular Rate:  80 PR Interval:  230 QRS Duration:  168 QT Interval:  455 QTC Calculation: 525 R Axis:   -62  Text Interpretation: Sinus rhythm Prolonged PR interval RBBB and LAFB Left ventricular hypertrophy Abnormal T, consider ischemia, inferior leads No significant change since EKG earlier today Confirmed by Haze Lonni PARAS [phone]) on 06/29/2024 11:03:30 PM  Radiology: MR BRAIN WO CONTRAST Result Date: 06/29/2024 EXAM: MRI BRAIN WITHOUT CONTRAST 06/29/2024 10:38:00 PM TECHNIQUE: Multiplanar multisequence MRI of the head/brain was performed without the administration of intravenous contrast. COMPARISON: 05/24/2013 CLINICAL HISTORY: Headache, increasing frequency or severity. FINDINGS: BRAIN AND VENTRICLES: No acute infarct. No intracranial hemorrhage. No mass. No midline shift. No hydrocephalus. Early confluent hyperintense T2-weighted signal within the cerebral white matter, most commonly due to chronic small vessel disease. The sella is unremarkable. Normal flow voids. ORBITS: No acute abnormality. SINUSES AND MASTOIDS: No acute abnormality. BONES AND SOFT TISSUES: Normal marrow signal. No acute soft tissue abnormality.  IMPRESSION: 1. No acute intracranial abnormality. 2. Chronic small vessel ischemic change in the cerebral white matter. Electronically signed by: Franky Stanford MD 06/29/2024 11:06 PM EST RP Workstation: [id number]   CT ANGIO HEAD NECK W WO CM Result Date: 06/29/2024 EXAM: CT HEAD WITHOUT CTA HEAD AND NECK WITH AND WITHOUT 06/29/2024 01:32:38 PM TECHNIQUE: CTA of the head and neck was performed with and without the administration of 75 mL of iohexol  (OMNIPAQUE ) 350 MG/ML injection. Noncontrast CT of the head with reconstructed 2-D images are also provided for review. Multiplanar 2D and/or 3D reformatted images are provided for review. Automated exposure control, iterative reconstruction, and/or weight based adjustment of the mA/kV was utilized to reduce the radiation dose to as low as reasonably achievable. COMPARISON: CT head 08/27/2022 and MRI head 05/24/2013 CLINICAL HISTORY: Vertigo, central FINDINGS: CT HEAD: BRAIN AND VENTRICLES: No acute intracranial hemorrhage. No mass effect. No midline shift. No extra-axial fluid collection. No evidence of acute infarct. No hydrocephalus. Similar hypoattenuation in the anterior aspect of the left basal ganglia suggestive of chronic microvascular ischemic changes and remote lacunar infarcts. Similar appearance of scattered hypoattenuation in the periventricular and subcortical white matter suggestive of moderate chronic microvascular ischemic changes. Mild parenchymal volume loss. Small remote cortical infarct in the left occipital lobe. ORBITS: Bilateral lens replacement. SINUSES AND MASTOIDS: Scattered mucosal thickening bilaterally in the maxillary and ethmoid sinuses. Hypoplastic left maxillary sinus. CTA NECK: AORTIC ARCH AND ARCH VESSELS: No dissection or arterial injury. Moderate atherosclerosis of the aortic arch. Atherosclerosis along the proximal right subclavian artery resulting in mild stenosis. No significant stenosis of the brachiocephalic artery. CERVICAL  CAROTID ARTERIES: Prominent atherosclerosis at the right carotid bifurcation resulting in severe stenosis with radiologic string sign noted. There is at least 85 percent stenosis at the origin of the right cervical ICA. Atherosclerosis at the left carotid bifurcation and proximal left cervical ICA resulting in approximately 55% stenosis. No dissection or arterial injury. CERVICAL VERTEBRAL ARTERIES: The vertebral arteries are patent from the origins to the vertebrobasilar confluence. Mild tortuosity of the bilateral V1 segments. Atherosclerosis of the bilateral V4 segments resulting in mild stenosis. No dissection or arterial injury. LUNGS AND MEDIASTINUM: Centrilobular emphysema noted. There is an enlarged lymph node within the aortopulmonary window measuring up to 1.4 cm in short axis (series 11,  image 165). Recommend correlation with dedicated chest CT. SOFT TISSUES: Heterogeneous appearance of the thyroid  with multiple subcentimeter nodules noted. BONES: Lucent focus within the posterior right frontal calvarium corresponding to a region of signal abnormality on the prior MRI favored to reflect a hemangioma. Degenerative changes of the bilateral temporomandibular joints. Degenerative changes in the visualized spine with severe disc space narrowing at multiple levels in the lower cervical spine. Anterior cervical fusion hardware at C3-C4. CTA HEAD: ANTERIOR CIRCULATION: Extensive atherosclerosis of the bilateral carotid siphons. There is moderate stenosis of the bilateral cavernous ICAs. There is irregularity and mild stenosis of the M1 segment of the right MCA. The middle cerebral arteries are patent bilaterally. No significant stenosis of the anterior cerebral arteries. 2 mm anterior communicating artery aneurysm. POSTERIOR CIRCULATION: Mild to moderate stenosis of the P2 segment of the right PCA. Additional irregularity and focal moderate stenosis of the P2 segment left PCA. No significant stenosis of the  basilar artery. No significant stenosis of the vertebral arteries. No aneurysm. OTHER: No dural venous sinus thrombosis on this non-dedicated study. IMPRESSION: 1. No acute intracranial abnormality. 2. No acute large vessel occlusion. 3. Severe (at least 85%) stenosis at the origin of the right cervical ICA with radiologic string sign. Approximately 55% stenosis at the left carotid bifurcation/proximal left cervical ICA. 4. Moderate stenosis of the bilateral cavernous ICAs. 5. 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 6. Enlarged aortopulmonary window lymph node measuring up to 1.4 cm, for which dedicated chest CT is recommended. Electronically signed by: Donnice Mania MD 06/29/2024 03:28 PM EST RP Workstation: [id number]   DG Chest 2 View Result Date: 06/29/2024 EXAM: 2 VIEW(S) XRAY OF THE CHEST 06/29/2024 12:33:00 PM COMPARISON: None available. CLINICAL HISTORY: shob FINDINGS: LUNGS AND PLEURA: Low lung volumes with bibasilar atelectasis. Right mid lung linear opacity. Coarsened interstitial markings without pulmonary edema. Blunting of bilateral costophrenic angles. No pneumothorax. HEART AND MEDIASTINUM: Cardiomegaly. Aortic calcification. BONES AND SOFT TISSUES: Cervical spine surgical hardware. No acute osseous abnormality. IMPRESSION: 1. Cardiomegaly without overt edema. 2. Blunting of bilateral costophrenic angles, possible trace bilateral effusions. 3. Low lung volumes with bibasilar atelectasis. Electronically signed by: Selinda Blue MD 06/29/2024 01:54 PM EST RP Workstation: [id number]     Medications Ordered in the ED  heparin  ADULT infusion 100 units/mL (25000 units/250mL) (950 Units/hr Intravenous New Bag/Given 06/29/24 2302)  iohexol  (OMNIPAQUE ) 350 MG/ML injection 75 mL (75 mLs Intravenous Contrast Given 06/29/24 1332)  lisinopril  (ZESTRIL ) tablet 10 mg (10 mg Oral Given 06/29/24 2136)  heparin  bolus via infusion 4,000 Units (4,000 Units Intravenous Bolus from Bag 06/29/24 2302)     Clinical Course as of 06/29/24 2317  Tue Jun 29, 2024  2206 History of NSTEMI in February 2024 but no intervention at that time. [AY]  2206 He has seen Dr. Francyne in February 2025 but no other recent cardiology visit noted on chart review [AY]  2212 Consulted Dr. Gail with cardiology who reviewed EKG.  Advised to start patient on heparin , get repeat EKG, and admit to medicine.  He will see patient in consult. [AY]    Clinical Course User Index [AY] Neysa Thersia RAMAN, [name]                                Medical Decision Making Patient is a 89 year old male with a history of restless leg syndrome, BPH, obstructive sleep apnea, NSTEMI in 2024, and hypertension who presents to the  ED with daughter for increasing weakness and head pressure for the past 3 days.  Also notes increasing shortness of breath.  Notes he is on 3 L nasal cannula at home as needed but has had to wear more than usual.  Please see detailed HPI above.  On exam patient is alert and in no acute distress.  Physical exam as above.  Hypertension noted on vitals but otherwise stable.  Differential includes viral illness, UTI, electrolyte abnormality, TIA, CVA, cardiac abnormality, CHF.  Initial plan was to evaluate for potential TIA versus CVA.  CT angio head and neck was obtained that was negative for acute intracranial abnormality.  There was no large vessel occlusion noted.  MRI of the brain was within obtained for further evaluation.  This is also negative for acute process today.  While patient was in MRI, initial troponin did come back significantly elevated at around 1100.  His initial EKG from 11 AM this morning was reviewed that showed potential inversion in the inferior leads.  Dr. Gail on-call with cardiology was consulted who did review patient's chart.  He reviewed EKG that also agreed that there was concerns for acute changes.  He advised that patient should be admitted for NSTEMI to the hospitalist.  He advised  started on heparin , has been ordered.  Hospitalist has been consulted and is agreeable to admit patient for further evaluation and management.  Results have been discussed with daughter and patient and they are aware of plan this evening.  Patient stable while in ED at this time.  Amount and/or Complexity of Data Reviewed Labs: ordered. Radiology: ordered.  Risk Prescription drug management. Decision regarding hospitalization.   CRITICAL CARE Performed by: Thersia GORMAN Salt Total critical care time: 31 minutes Critical care time was exclusive of separately billable procedures and treating other patients. Critical care was necessary to treat or prevent imminent or life-threatening deterioration. Critical care was time spent personally by me on the following activities: development of treatment plan with patient and/or surrogate as well as nursing, discussions with consultants, evaluation of patient's response to treatment, examination of patient, obtaining history from patient or surrogate, ordering and performing treatments and interventions, ordering and review of laboratory studies, ordering and review of radiographic studies, pulse oximetry and re-evaluation of patient's condition.    Final diagnoses:  NSTEMI (non-ST elevated myocardial infarction) (HCC)  Pressure in head  Lightheaded    ED Discharge Orders     None          Salt Thersia GORMAN, [name] 06/29/24 2316    Salt Thersia GORMAN, [name] 06/29/24 2317    Pamella Ozell LABOR, DO 07/05/24 [id number]  "

## 2024-06-29 NOTE — ED Notes (Signed)
 Patient transported to MRI

## 2024-06-29 NOTE — Consult Note (Incomplete)
 "  Cardiology Consultation   Patient ID: KEYMON MCELROY MRN: [id number]; DOB: Oct 30, 1925  Admit date: 06/29/2024 Date of Consult: 06/29/2024  PCP:  Franco Salts   [facility] Providers Cardiologist:  Jerel Balding, MD   { Click here to update MD or APP on Care Team, Refresh:1}     Patient Profile: Brandon Mason is a 89 y.o. male with a hx of hypertension, obstructive sleep apnea, CVA, and chronic kidney disease who is being seen 06/29/2024 for the evaluation of elevated troponin at the request of the emergency department.  History of Present Illness: [name] ***   Past Medical History:  Diagnosis Date   Abnormality of gait 02/09/2013   Arthritis    Cancer (HCC)    basal cell skin cancer   Cardiomegaly    Chronic kidney disease    BPH   Chronic leg pain    Hypersomnia    Hypertension    Hypoxia    pulmonary   Neuromuscular disorder (HCC)    restless leg syndrome    Nocturia    Prostate disorder    Restless legs syndrome (RLS) 02/09/2013   Restless legs syndrome with nocturnal myoclonus    RLS (restless legs syndrome)    x 40 years   Shortness of breath    with exertion    Sleep apnea    uses cpap   Stroke (HCC)    minor stroke in 2003 - no lasting side effects   UARS (upper airway resistance syndrome) 06/28/2014    Past Surgical History:  Procedure Laterality Date   ANTERIOR CERVICAL DECOMP/DISCECTOMY FUSION N/A 03/29/2016   Procedure: ANTERIOR CERVICAL DECOMPRESSION/DISCECTOMY FUSION CERVICAL THREE- CERVICAL FOUR;  Surgeon: Rockey Peru, MD;  Location: [facility];  Service: Neurosurgery;  Laterality: N/A;   BACK SURGERY  2003   L4-5   CATARACT EXTRACTION Bilateral    ESOPHAGOGASTRODUODENOSCOPY (EGD) WITH PROPOFOL  N/A 03/17/2022   Procedure: ESOPHAGOGASTRODUODENOSCOPY (EGD) WITH PROPOFOL ;  Surgeon: Federico Rosario BROCKS, MD;  Location: [facility]COPY;  Service: Gastroenterology;  Laterality: N/A;   HERNIA REPAIR     inguinal hernia    TRANSURETHRAL PROSTATECTOMY WITH GYRUS INSTRUMENTS  04/28/2012   Procedure: TRANSURETHRAL PROSTATECTOMY WITH GYRUS INSTRUMENTS;  Surgeon: Donnice Gwenyth Brooks, MD;  Location: [facility];  Service: Urology;  Laterality: N/A;   TRANSURETHRAL RESECTION OF PROSTATE  03/24/2012   Procedure: TRANSURETHRAL RESECTION OF THE PROSTATE (TURP);  Surgeon: Donnice Gwenyth Brooks, MD;  Location: [facility];  Service: Urology;  Laterality: N/A;  with gyrus ,Greenlight PVP laser of Prostate     Home Medications:  Prior to Admission medications  Medication Sig Start Date End Date Taking? Authorizing Provider  albuterol  (VENTOLIN  HFA) 108 (90 Base) MCG/ACT inhaler Inhale 2 puffs into the lungs every 4 (four) hours as needed. 05/21/24  Yes [provider]  hydrALAZINE (APRESOLINE) 50 MG tablet Take 50 mg by mouth 2 (two) times daily. 05/14/24  Yes [provider]  ofloxacin (OCUFLOX) 0.3 % ophthalmic solution Place 1 drop into the right eye 4 (four) times daily. 06/02/24  Yes [provider]  prednisoLONE acetate (PRED FORTE) 1 % ophthalmic suspension Place 1 drop into the right eye 4 (four) times daily. 06/02/24  Yes [provider]  cholecalciferol (VITAMIN D3) 25 MCG (1000 UNIT) tablet Take 1,000 Units by mouth 2 (two) times daily.    [provider]  docusate sodium  (COLACE) 100 MG capsule Take 1 capsule (100 mg total) by mouth 2 (two) times daily.  Patient not taking: Reported on 08/26/2023 09/02/22   Brandon Burnard SAUNDERS, [name]  ferrous sulfate  325 (65 FE) MG EC tablet Take 1 tablet by mouth daily. Patient not taking: Reported on 08/26/2023 04/29/22   [provider]  finasteride  (PROSCAR ) 5 MG tablet TAKE 1 TABLET BY MOUTH  DAILY Patient taking differently: Take 5 mg by mouth daily. 05/03/21   Saguier, [place], [name]  furosemide  (LASIX ) 20 MG tablet Take 20 mg by mouth every other day. Take 1 Tablet Every other Day.    [provider]  guaiFENesin  (MUCINEX ) 600 MG  12 hr tablet Take 1 tablet (600 mg total) by mouth 2 (two) times daily. Patient not taking: Reported on 08/26/2023 09/02/22   Brandon Burnard SAUNDERS, [name]  levothyroxine (SYNTHROID) 25 MCG tablet Take 1 tablet by mouth daily.    [provider]  lisinopril  (ZESTRIL ) 10 MG tablet Take 2 tablets by mouth daily.    [provider]  methocarbamol  (ROBAXIN ) 500 MG tablet Take 1 tablet (500 mg total) by mouth every 8 (eight) hours as needed for muscle spasms. Patient not taking: Reported on 08/26/2023 09/02/22   Brandon Burnard SAUNDERS, [name]  Multiple Vitamins-Minerals (ONE A DAY MEN 50 PLUS PO) Take 1 tablet by mouth daily at 6 (six) AM.    [provider]  naloxone (NARCAN) nasal spray 4 mg/0.1 mL PLEASE SEE ATTACHED FOR DETAILED DIRECTIONS Patient not taking: Reported on 08/26/2023    [provider]  polyethylene glycol (MIRALAX  / GLYCOLAX ) 17 g packet Take 17 g by mouth daily as needed. 09/02/22   Brandon Burnard SAUNDERS, [name]  pregabalin  (LYRICA ) 150 MG capsule Take 1 capsule (150 mg total) by mouth 2 (two) times daily. 09/09/22   [name], [name], [name]  rOPINIRole  (REQUIP ) 1 MG tablet Take 1 mg by mouth at bedtime.    [provider]  rOPINIRole  (REQUIP ) 2 MG tablet Take 1 tablet by mouth 3 (three) times daily. 09/24/22   [provider]  senna-docusate (SENOKOT-S) 8.6-50 MG tablet Take 1 tablet by mouth daily as needed for mild constipation or moderate constipation. 03/22/22   [provider]  traMADol  (ULTRAM ) 50 MG tablet Take 1 tablet (50 mg total) by mouth every 6 (six) hours as needed (breakthrough pain). Take 1 tablet by mouth every 4 to 6 hours for severe pain. May take 1 additional tablet at night if needed 0700, 1200, 1700, 2200 09/09/22   [name], [name], [name]  traMADol  (ULTRAM ) 50 MG tablet Take 1 tablet by mouth 4 (four) times daily. 09/24/22   [provider]  triamcinolone  cream (KENALOG ) 0.1 % Apply 1 Application topically 2 (two) times  daily. Patient not taking: Reported on 08/26/2023    [provider]    Scheduled Meds:  heparin   4,000 Units Intravenous Once   Continuous Infusions:  heparin      PRN Meds:   Allergies:   Allergies[1]  Social History:   Social History   Socioeconomic History   Marital status: Married    Spouse name: Ann   Number of children: 5   Years of education: 12   Highest education level: Not on file  Occupational History   Occupation: retired     Comment: Production Designer, Theatre/television/film for a museum/gallery curator  Tobacco Use   Smoking status: Never   Smokeless tobacco: Never  Substance and Sexual Activity   Alcohol use: Yes    Alcohol/week: 2.0 standard drinks of alcohol    Types: 2 Cans of beer per  week    Comment: weekly   Drug use: No   Sexual activity: Not on file  Other Topics Concern   Not on file  Social History Narrative   Patient lives at home with his wife Jenkins.   Patient is retired.    Patient has 5 children.   Patient has a high school education.   Patient is right-handed.   Patient drinks very little caffeine.   Social Drivers of Health   Tobacco Use: Low Risk (06/29/2024)   Patient History    Smoking Tobacco Use: Never    Smokeless Tobacco Use: Never    Passive Exposure: Not on file  Financial Resource Strain: Low Risk (04/13/2024)   Received from [facility]   Overall Financial Resource Strain (CARDIA)    How hard is it for you to pay for the very basics like food, housing, medical care, and heating?: Not hard at all  Food Insecurity: No Food Insecurity (04/13/2024)   Received from [facility]   Epic    Within the past 12 months, you worried that your food would run out before you got the money to buy more.: Never true    Within the past 12 months, the food you bought just didn't last and you didn't have money to get more.: Never true  Transportation Needs: No Transportation Needs (04/13/2024)   Received from [facility]    In the past 12 months, has  lack of transportation kept you from medical appointments or from getting medications?: No    In the past 12 months, has lack of transportation kept you from meetings, work, or from getting things needed for daily living?: No  Physical Activity: Inactive (04/13/2024)   Received from [facility]   Exercise Vital Sign    On average, how many days per week do you engage in moderate to strenuous exercise (like a brisk walk)?: 0 days    Minutes of Exercise per Session: Not on file  Stress: No Stress Concern Present (04/13/2024)   Received from [facility] - Occupational Stress Questionnaire    Do you feel stress - tense, restless, nervous, or anxious, or unable to sleep at night because your mind is troubled all the time - these days?: Only a little  Social Connections: Socially Integrated (04/13/2024)   Received from [facility]   Social Network    How would you rate your social network (family, work, friends)?: Good participation with social networks  Intimate Partner Violence: Not At Risk (04/13/2024)   Received from [facility]   HITS    Over the last 12 months how often did your partner physically hurt you?: Never    Over the last 12 months how often did your partner insult you or talk down to you?: Never    Over the last 12 months how often did your partner threaten you with physical harm?: Never    Over the last 12 months how often did your partner scream or curse at you?: Never  Depression (PHQ2-9): Not on file  Alcohol Screen: Not on file  Housing: Low Risk (04/13/2024)   Received from [facility]    In the last 12 months, was there a time when you were not able to pay the mortgage or rent on time?: No    In the past 12 months, how many times have you moved where you were living?: 0    At  any time in the past 12 months, were you homeless or living in a shelter (including now)?: No  Utilities: Not At Risk (04/13/2024)    Received from [facility]    In the past 12 months has the electric, gas, oil, or water company threatened to shut off services in your home?: No  Health Literacy: Not on file    Family History:    Family History  Problem Relation Age of Onset   Restless legs syndrome Mother    Restless legs syndrome Daughter    Esophageal cancer Neg Hx    Colon cancer Neg Hx    Pancreatic cancer Neg Hx    Stomach cancer Neg Hx      ROS:  Please see the history of present illness.   All other ROS reviewed and negative.     Physical Exam/Data: Vitals:   06/29/24 2044 06/29/24 2045 06/29/24 2100 06/29/24 2136  BP: (!) 189/103 (!) 189/103 (!) 182/101 (!) 188/105  Pulse: 86 85 84   Resp: (!) 22 19 (!) 21   Temp: (!) 97.5 F (36.4 C)     TempSrc: Oral     SpO2: 95% 95% 96%   Weight:      Height:       No intake or output data in the 24 hours ending 06/29/24 2220    06/29/2024   12:12 PM 08/26/2023    9:16 AM 08/27/2022    2:17 PM  Last 3 Weights  Weight (lbs) 180 lb 181 lb 9.6 oz 173 lb  Weight (kg) 81.647 kg 82.373 kg 78.472 kg     Body mass index is 27.37 kg/Mason.  General:  Well nourished, well developed, in no acute distress*** HEENT: normal Neck: no JVD Vascular: No carotid bruits; Distal pulses 2+ bilaterally Cardiac:  normal S1, S2; RRR; no murmur *** Lungs:  clear to auscultation bilaterally, no wheezing, rhonchi or rales  Abd: soft, nontender, no hepatomegaly  Ext: no edema Musculoskeletal:  No deformities, BUE and BLE strength normal and equal Skin: warm and dry  Neuro:  CNs 2-12 intact, no focal abnormalities noted Psych:  Normal affect   EKG:  The EKG was personally reviewed and demonstrates:   Telemetry:  Telemetry was personally reviewed and demonstrates:    Relevant CV Studies:  reviewed  Laboratory Data: High Sensitivity Troponin:  No results for input(s): TROPONINIHS in the last 720 hours.  Recent Labs  Lab 06/29/24 2102  TRNPT 1,130*       Chemistry Recent Labs  Lab 06/29/24 1223  NA 140  K 4.3  CL 101  CO2 30  GLUCOSE 83  BUN 34*  CREATININE 1.02  CALCIUM  9.3  GFRNONAA >60  ANIONGAP 8    No results for input(s): PROT, ALBUMIN, AST, ALT, ALKPHOS, BILITOT in the last 168 hours. Lipids No results for input(s): CHOL, TRIG, HDL, LABVLDL, LDLCALC, CHOLHDL in the last 168 hours.  Hematology Recent Labs  Lab 06/29/24 1223  WBC 8.2  RBC 3.50*  HGB 11.7*  HCT 35.9*  MCV 102.6*  MCH 33.4  MCHC 32.6  RDW 14.0  PLT 248   Thyroid  No results for input(s): TSH, FREET4 in the last 168 hours.  BNP Recent Labs  Lab 06/29/24 2102  PROBNP 2,135.0*    DDimer No results for input(s): DDIMER in the last 168 hours.  Radiology/Studies:  CT ANGIO HEAD NECK W WO CM Result Date: 06/29/2024 EXAM: CT HEAD WITHOUT CTA HEAD AND NECK WITH AND  WITHOUT 06/29/2024 01:32:38 PM TECHNIQUE: CTA of the head and neck was performed with and without the administration of 75 mL of iohexol  (OMNIPAQUE ) 350 MG/ML injection. Noncontrast CT of the head with reconstructed 2-D images are also provided for review. Multiplanar 2D and/or 3D reformatted images are provided for review. Automated exposure control, iterative reconstruction, and/or weight based adjustment of the mA/kV was utilized to reduce the radiation dose to as low as reasonably achievable. COMPARISON: CT head 08/27/2022 and MRI head 05/24/2013 CLINICAL HISTORY: Vertigo, central FINDINGS: CT HEAD: BRAIN AND VENTRICLES: No acute intracranial hemorrhage. No mass effect. No midline shift. No extra-axial fluid collection. No evidence of acute infarct. No hydrocephalus. Similar hypoattenuation in the anterior aspect of the left basal ganglia suggestive of chronic microvascular ischemic changes and remote lacunar infarcts. Similar appearance of scattered hypoattenuation in the periventricular and subcortical white matter suggestive of moderate chronic microvascular  ischemic changes. Mild parenchymal volume loss. Small remote cortical infarct in the left occipital lobe. ORBITS: Bilateral lens replacement. SINUSES AND MASTOIDS: Scattered mucosal thickening bilaterally in the maxillary and ethmoid sinuses. Hypoplastic left maxillary sinus. CTA NECK: AORTIC ARCH AND ARCH VESSELS: No dissection or arterial injury. Moderate atherosclerosis of the aortic arch. Atherosclerosis along the proximal right subclavian artery resulting in mild stenosis. No significant stenosis of the brachiocephalic artery. CERVICAL CAROTID ARTERIES: Prominent atherosclerosis at the right carotid bifurcation resulting in severe stenosis with radiologic string sign noted. There is at least 85 percent stenosis at the origin of the right cervical ICA. Atherosclerosis at the left carotid bifurcation and proximal left cervical ICA resulting in approximately 55% stenosis. No dissection or arterial injury. CERVICAL VERTEBRAL ARTERIES: The vertebral arteries are patent from the origins to the vertebrobasilar confluence. Mild tortuosity of the bilateral V1 segments. Atherosclerosis of the bilateral V4 segments resulting in mild stenosis. No dissection or arterial injury. LUNGS AND MEDIASTINUM: Centrilobular emphysema noted. There is an enlarged lymph node within the aortopulmonary window measuring up to 1.4 cm in short axis (series 11, image 165). Recommend correlation with dedicated chest CT. SOFT TISSUES: Heterogeneous appearance of the thyroid  with multiple subcentimeter nodules noted. BONES: Lucent focus within the posterior right frontal calvarium corresponding to a region of signal abnormality on the prior MRI favored to reflect a hemangioma. Degenerative changes of the bilateral temporomandibular joints. Degenerative changes in the visualized spine with severe disc space narrowing at multiple levels in the lower cervical spine. Anterior cervical fusion hardware at C3-C4. CTA HEAD: ANTERIOR CIRCULATION:  Extensive atherosclerosis of the bilateral carotid siphons. There is moderate stenosis of the bilateral cavernous ICAs. There is irregularity and mild stenosis of the M1 segment of the right MCA. The middle cerebral arteries are patent bilaterally. No significant stenosis of the anterior cerebral arteries. 2 mm anterior communicating artery aneurysm. POSTERIOR CIRCULATION: Mild to moderate stenosis of the P2 segment of the right PCA. Additional irregularity and focal moderate stenosis of the P2 segment left PCA. No significant stenosis of the basilar artery. No significant stenosis of the vertebral arteries. No aneurysm. OTHER: No dural venous sinus thrombosis on this non-dedicated study. IMPRESSION: 1. No acute intracranial abnormality. 2. No acute large vessel occlusion. 3. Severe (at least 85%) stenosis at the origin of the right cervical ICA with radiologic string sign. Approximately 55% stenosis at the left carotid bifurcation/proximal left cervical ICA. 4. Moderate stenosis of the bilateral cavernous ICAs. 5. 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 6. Enlarged aortopulmonary window lymph node measuring up to 1.4 cm, for which dedicated  chest CT is recommended. Electronically signed by: Donnice Mania MD 06/29/2024 03:28 PM EST RP Workstation: [id number]   DG Chest 2 View Result Date: 06/29/2024 EXAM: 2 VIEW(S) XRAY OF THE CHEST 06/29/2024 12:33:00 PM COMPARISON: None available. CLINICAL HISTORY: shob FINDINGS: LUNGS AND PLEURA: Low lung volumes with bibasilar atelectasis. Right mid lung linear opacity. Coarsened interstitial markings without pulmonary edema. Blunting of bilateral costophrenic angles. No pneumothorax. HEART AND MEDIASTINUM: Cardiomegaly. Aortic calcification. BONES AND SOFT TISSUES: Cervical spine surgical hardware. No acute osseous abnormality. IMPRESSION: 1. Cardiomegaly without overt edema. 2. Blunting of bilateral costophrenic angles, possible trace bilateral effusions. 3. Low  lung volumes with bibasilar atelectasis. Electronically signed by: Selinda Blue MD 06/29/2024 01:54 PM EST RP Workstation: [id number]     Assessment and Plan:  MARDELL CRAGG is a 89 y.o. male with a hx of hypertension, obstructive sleep apnea, CVA, and chronic kidney disease who is being seen 06/29/2024 for the evaluation of NSTEMI.   Patient has ECG evidence of chronic right bundle branch block, LAFB and LVH  with ST changes representing ischemia. He has evidence of myocardial infarct. His presentation is concerning for obstructive coronary disease, although this presentation could also be from hypertensive emergency. Risk factors for CAD include hypertension and advanced age. Would recommend treating for ACS.   Recommendations -admit to medicine -control BP (goal SBP <150) -start IV heparin  -aspirin  load + 81mg  aspirin  -NPO at MN  -will discuss LHC vs medical management if within goals of care -TTE in AM -goal K>4, MG>2    Risk Assessment/Risk Scores: {Complete the following score calculators/questions to meet required metrics.  Press F2         :[id number]}   TIMI Risk Score for Unstable Angina or Non-ST Elevation MI:   The patient's TIMI risk score is  , which indicates a  % risk of all cause mortality, new or recurrent myocardial infarction or need for urgent revascularization in the next 14 days.{ Click here to calculate score  REFRESH Note before signing  :1}         For questions or updates, please contact [facility] Please consult [email or web address] for contact info under    {TIP  Split Shared Billing  Do NOT delete any part of this including brackets If split shared billing is based upon MDM, disregard If billing will be based upon TIME you MUST document the number of minutes and a detailed list of what was done in that time in the following format Example - I spent ** minutes seeing this patient. During that time I reviewed their history, evaluated their  symptoms, reviewed available labs, EKGs, studies, performed an exam and formulated an assessment and plan   :1} {Select this only if you need to document critical care time (Optional):[phone]} Signed, Laurene DELENA Pacini, MD  06/29/2024 10:20 PM      [1] No Known Allergies  "

## 2024-06-29 NOTE — ED Notes (Signed)
 This RN notified Triage PA Carlo of CTA results

## 2024-06-29 NOTE — Progress Notes (Signed)
 [facility] ED [facility] Liaison Note:   This is a current home-based patient with [facility]. We can provide next business day visits, including labs, diagnostics, medications, disease management, and ongoing monitoring -- to prevent an unnecessary hospital admission.   if patient has a Home Health need upon discharge, we would request the referral to be sent to us  as this is our current patient and we would like to keep them within our care continuum.  Please call with any questions or concerns. Liaison will continue to follow for discharge disposition.   Thank you, Eleanor Nail, LPN [phone]

## 2024-06-29 NOTE — ED Triage Notes (Signed)
 Patient states the he has pressure in his head, the light hurts his eyes and hew just wants to keep his eyes closed all the time. He states he feels like his head is going around and around. He is not able to walk well since these symptoms started 2 days ago. He was mobile with a cane up until this point. He states his hand are trembling and he is having shob as well. He has oxygen  at home he uses when he needs. Hr has been having to use the oxygen  more frequently over the past couple days.

## 2024-06-29 NOTE — ED Provider Triage Note (Signed)
 Emergency Medicine Provider Triage Evaluation Note  Brandon Mason , a 89 y.o. male  was evaluated in triage.  Pt complains of dizziness for the past 2 days.  Patient reports he has some frontal head pressure.  Has some photophobia.  Denies any new vision changes.  Denies any pain with mastication.  Denies any fever or neck stiffness or any recent cough or cold symptoms.  Reports that he feels dizziness even with sitting still.  Does feel better with closing his eyes however.  Does have some photophobia but no phonophobia.  He mentions that he had some chest pressure around Saturday after eating a large breakfast but has not had any since.  Does have some intermittent shortness of breath however does not feel this is any worsening than usual.  Review of Systems  Positive:  Negative:   Physical Exam  BP (!) 180/91   Pulse 88   Temp 97.6 F (36.4 C)   Resp 17   Ht 5' 8 (1.727 m)   Wt 81.6 kg   SpO2 93%   BMI 27.37 kg/m  Gen:   Awake, no distress   Resp:  Normal effort  MSK:   Moves extremities without difficulty  Other:  Cranial nerves grossly intact.  Strength intact and symmetric in bilateral upper and lower extremities.  Answering questions appropriately the appropriate speech.  Medical Decision Making  Medically screening exam initiated at 12:41 PM.  Appropriate orders placed.  Brandon Mason was informed that the remainder of the evaluation will be completed by another provider, this initial triage assessment does not replace that evaluation, and the importance of remaining in the ED until their evaluation is complete.  Patient is out of any acute stroke window.  Will order CTA head and neck as well as basic labs.   Brandon Mason, [place] 06/29/24 1243

## 2024-06-29 NOTE — ED Notes (Signed)
 Patient transported to X-ray

## 2024-06-29 NOTE — Progress Notes (Signed)
 PHARMACY - ANTICOAGULATION CONSULT NOTE  Pharmacy Consult for heparin  Indication: chest pain/ACS  Allergies[1]  Patient Measurements: Height: 5' 8 (172.7 cm) Weight: 81.6 kg (180 lb) IBW/kg (Calculated) : 68.4 HEPARIN  DW (KG): 81.6  Vital Signs: Temp: 97.5 F (36.4 C) (12/30 2044) Temp Source: Oral (12/30 2044) BP: 188/105 (12/30 2136) Pulse Rate: 84 (12/30 2100)  Labs: Recent Labs    06/29/24 1223  HGB 11.7*  HCT 35.9*  PLT 248  CREATININE 1.02    Estimated Creatinine Clearance: 41 mL/min (by C-G formula based on SCr of 1.02 mg/dL).   Medical History: Past Medical History:  Diagnosis Date   Abnormality of gait 02/09/2013   Arthritis    Cancer (HCC)    basal cell skin cancer   Cardiomegaly    Chronic kidney disease    BPH   Chronic leg pain    Hypersomnia    Hypertension    Hypoxia    pulmonary   Neuromuscular disorder (HCC)    restless leg syndrome    Nocturia    Prostate disorder    Restless legs syndrome (RLS) 02/09/2013   Restless legs syndrome with nocturnal myoclonus    RLS (restless legs syndrome)    x 40 years   Shortness of breath    with exertion    Sleep apnea    uses cpap   Stroke [facility])    minor stroke in 2003 - no lasting side effects   UARS (upper airway resistance syndrome) 06/28/2014     Assessment: 89 YOM presenting with SOB, dizziness, hx CAD, he is not on anticoagulation PTA, chronic anemia stable, plts wnl  Goal of Therapy:  Heparin  level 0.3-0.7 units/ml Monitor platelets by anticoagulation protocol: Yes   Plan:  Heparin  4000 units IV x 1, and gtt at 950 units/hr F/u 8 hour heparin  level F/u cards eval and recs  Dorn Poot, PharmD, [facility] Clinical Pharmacist ED Pharmacist Phone # [phone] 06/29/2024 10:16 PM      [1] No Known Allergies

## 2024-06-30 ENCOUNTER — Encounter (HOSPITAL_COMMUNITY): Payer: Self-pay | Admitting: Internal Medicine

## 2024-06-30 ENCOUNTER — Inpatient Hospital Stay (HOSPITAL_COMMUNITY)
Admission: EM | Disposition: A | Discharge status: Home Health | Payer: Self-pay | Source: Home / Self Care | Attending: Family Medicine

## 2024-06-30 DIAGNOSIS — D539 Nutritional anemia, unspecified: Secondary | ICD-10-CM | POA: Diagnosis not present

## 2024-06-30 DIAGNOSIS — I214 Non-ST elevation (NSTEMI) myocardial infarction: Secondary | ICD-10-CM

## 2024-06-30 DIAGNOSIS — Z7982 Long term (current) use of aspirin: Secondary | ICD-10-CM | POA: Diagnosis not present

## 2024-06-30 DIAGNOSIS — Z7902 Long term (current) use of antithrombotics/antiplatelets: Secondary | ICD-10-CM

## 2024-06-30 DIAGNOSIS — I2510 Atherosclerotic heart disease of native coronary artery without angina pectoris: Secondary | ICD-10-CM | POA: Diagnosis not present

## 2024-06-30 DIAGNOSIS — I160 Hypertensive urgency: Secondary | ICD-10-CM | POA: Insufficient documentation

## 2024-06-30 DIAGNOSIS — I6529 Occlusion and stenosis of unspecified carotid artery: Secondary | ICD-10-CM | POA: Insufficient documentation

## 2024-06-30 DIAGNOSIS — I6521 Occlusion and stenosis of right carotid artery: Secondary | ICD-10-CM

## 2024-06-30 DIAGNOSIS — E039 Hypothyroidism, unspecified: Secondary | ICD-10-CM | POA: Insufficient documentation

## 2024-06-30 HISTORY — PX: LEFT HEART CATH AND CORONARY ANGIOGRAPHY: CATH118249

## 2024-06-30 LAB — URINALYSIS, ROUTINE W REFLEX MICROSCOPIC
Bilirubin Urine: NEGATIVE
Glucose, UA: NEGATIVE mg/dL
Hgb urine dipstick: NEGATIVE
Ketones, ur: NEGATIVE mg/dL
Leukocytes,Ua: NEGATIVE
Nitrite: NEGATIVE
Protein, ur: NEGATIVE mg/dL
Specific Gravity, Urine: 1.01 (ref 1.005–1.030)
pH: 8 (ref 5.0–8.0)

## 2024-06-30 LAB — COMPREHENSIVE METABOLIC PANEL WITH GFR
ALT: 13 U/L (ref 0–44)
AST: 29 U/L (ref 15–41)
Albumin: 3.8 g/dL (ref 3.5–5.0)
Alkaline Phosphatase: 59 U/L (ref 38–126)
Anion gap: 9 (ref 5–15)
BUN: 26 mg/dL — ABNORMAL HIGH (ref 8–23)
CO2: 28 mmol/L (ref 22–32)
Calcium: 9.3 mg/dL (ref 8.9–10.3)
Chloride: 100 mmol/L (ref 98–111)
Creatinine, Ser: 0.91 mg/dL (ref 0.61–1.24)
GFR, Estimated: >60 mL/min
Glucose, Bld: 98 mg/dL (ref 70–99)
Potassium: 4.2 mmol/L (ref 3.5–5.1)
Sodium: 137 mmol/L (ref 135–145)
Total Bilirubin: 0.5 mg/dL (ref 0.0–1.2)
Total Protein: 6.7 g/dL (ref 6.5–8.1)

## 2024-06-30 LAB — CBC
HCT: 37 % — ABNORMAL LOW (ref 39.0–52.0)
Hemoglobin: 12.3 g/dL — ABNORMAL LOW (ref 13.0–17.0)
MCH: 33.3 pg (ref 26.0–34.0)
MCHC: 33.2 g/dL (ref 30.0–36.0)
MCV: 100.3 fL — ABNORMAL HIGH (ref 80.0–100.0)
Platelets: 251 K/uL (ref 150–400)
RBC: 3.69 MIL/uL — ABNORMAL LOW (ref 4.22–5.81)
RDW: 13.8 % (ref 11.5–15.5)
WBC: 5.9 K/uL (ref 4.0–10.5)
nRBC: 0 % (ref 0.0–0.2)

## 2024-06-30 LAB — TROPONIN T, HIGH SENSITIVITY
Troponin T High Sensitivity: 1188 ng/L (ref 0–19)
Troponin T High Sensitivity: 1223 ng/L (ref 0–19)

## 2024-06-30 LAB — VITAMIN B12: Vitamin B-12: 597 pg/mL (ref 180–914)

## 2024-06-30 LAB — FOLATE: Folate: 19.1 ng/mL

## 2024-06-30 LAB — HEPARIN LEVEL (UNFRACTIONATED): Heparin Unfractionated: <0.1 [IU]/mL — ABNORMAL LOW (ref 0.30–0.70)

## 2024-06-30 MED ORDER — METOPROLOL TARTRATE 12.5 MG HALF TABLET
12.5000 mg | ORAL_TABLET | Freq: Two times a day (BID) | ORAL | Status: DC
  Administered 2024-06-30 – 2024-07-03 (×7): 12.5 mg via ORAL
  Filled 2024-06-30 (×7): qty 1

## 2024-06-30 MED ORDER — MIDAZOLAM HCL (PF) 2 MG/2ML IJ SOLN
INTRAMUSCULAR | Status: DC | PRN
  Administered 2024-06-30: 1 mg via INTRAVENOUS

## 2024-06-30 MED ORDER — ACETAMINOPHEN 325 MG PO TABS
650.0000 mg | ORAL_TABLET | Freq: Four times a day (QID) | ORAL | Status: DC | PRN
  Administered 2024-07-02: 650 mg via ORAL
  Filled 2024-06-30: qty 2

## 2024-06-30 MED ORDER — HYDRALAZINE HCL 50 MG PO TABS
50.0000 mg | ORAL_TABLET | Freq: Two times a day (BID) | ORAL | Status: DC
  Administered 2024-06-30 – 2024-07-03 (×8): 50 mg via ORAL
  Filled 2024-06-30: qty 1
  Filled 2024-06-30: qty 2
  Filled 2024-06-30 (×2): qty 1
  Filled 2024-06-30: qty 2
  Filled 2024-06-30 (×3): qty 1

## 2024-06-30 MED ORDER — FINASTERIDE 5 MG PO TABS
5.0000 mg | ORAL_TABLET | Freq: Every day | ORAL | Status: DC
  Administered 2024-06-30 – 2024-07-03 (×4): 5 mg via ORAL
  Filled 2024-06-30 (×4): qty 1

## 2024-06-30 MED ORDER — FREE WATER: 500.0000 mL | Freq: Once | Status: DC

## 2024-06-30 MED ORDER — VERAPAMIL HCL 2.5 MG/ML IV SOLN
INTRAVENOUS | Status: AC
  Filled 2024-06-30: qty 2

## 2024-06-30 MED ORDER — HEPARIN (PORCINE) 25000 UT/250ML-% IV SOLN
1300.0000 [IU]/h | INTRAVENOUS | Status: DC
  Administered 2024-06-30: 1100 [IU]/h via INTRAVENOUS
  Administered 2024-07-01 – 2024-07-02 (×2): 1300 [IU]/h via INTRAVENOUS
  Filled 2024-06-30 (×3): qty 250

## 2024-06-30 MED ORDER — ATORVASTATIN CALCIUM 80 MG PO TABS
80.0000 mg | ORAL_TABLET | Freq: Every day | ORAL | Status: DC
  Administered 2024-06-30 – 2024-07-03 (×4): 80 mg via ORAL
  Filled 2024-06-30 (×2): qty 1
  Filled 2024-06-30: qty 2
  Filled 2024-06-30: qty 1

## 2024-06-30 MED ORDER — DOCUSATE SODIUM 100 MG PO CAPS: 100.0000 mg | ORAL_CAPSULE | Freq: Two times a day (BID) | ORAL | Status: DC | PRN

## 2024-06-30 MED ORDER — LEVOTHYROXINE SODIUM 25 MCG PO TABS
25.0000 ug | ORAL_TABLET | Freq: Every day | ORAL | Status: DC
  Administered 2024-06-30 – 2024-07-03 (×4): 25 ug via ORAL
  Filled 2024-06-30 (×4): qty 1

## 2024-06-30 MED ORDER — SODIUM CHLORIDE 0.9 % IV SOLN: 250.0000 mL | INTRAVENOUS | Status: DC | PRN

## 2024-06-30 MED ORDER — NITROGLYCERIN 0.4 MG SL SUBL
SUBLINGUAL_TABLET | SUBLINGUAL | Status: DC | PRN
  Administered 2024-06-30: .4 mg via SUBLINGUAL

## 2024-06-30 MED ORDER — HEPARIN BOLUS VIA INFUSION
2000.0000 [IU] | Freq: Once | INTRAVENOUS | Status: AC
  Administered 2024-06-30: 2000 [IU] via INTRAVENOUS
  Filled 2024-06-30: qty 2000

## 2024-06-30 MED ORDER — FENTANYL CITRATE (PF) 100 MCG/2ML IJ SOLN
INTRAMUSCULAR | Status: AC
  Filled 2024-06-30: qty 2

## 2024-06-30 MED ORDER — SODIUM CHLORIDE 0.9% FLUSH: 3.0000 mL | INTRAVENOUS | Status: DC | PRN

## 2024-06-30 MED ORDER — FUROSEMIDE 20 MG PO TABS
20.0000 mg | ORAL_TABLET | ORAL | Status: DC
  Administered 2024-07-01 – 2024-07-03 (×2): 20 mg via ORAL
  Filled 2024-06-30 (×2): qty 1

## 2024-06-30 MED ORDER — ACETAMINOPHEN 650 MG RE SUPP: 650.0000 mg | Freq: Four times a day (QID) | RECTAL | Status: DC | PRN

## 2024-06-30 MED ORDER — ROPINIROLE HCL 1 MG PO TABS: 1.0000 mg | ORAL_TABLET | Freq: Every day | ORAL | Status: DC

## 2024-06-30 MED ORDER — LIDOCAINE HCL (PF) 1 % IJ SOLN
INTRAMUSCULAR | Status: AC
  Filled 2024-06-30: qty 30

## 2024-06-30 MED ORDER — HEPARIN SODIUM (PORCINE) 1000 UNIT/ML IJ SOLN
INTRAMUSCULAR | Status: AC
  Filled 2024-06-30: qty 10

## 2024-06-30 MED ORDER — PREGABALIN 75 MG PO CAPS
150.0000 mg | ORAL_CAPSULE | Freq: Two times a day (BID) | ORAL | Status: DC
  Administered 2024-06-30 – 2024-07-03 (×8): 150 mg via ORAL
  Filled 2024-06-30 (×8): qty 2

## 2024-06-30 MED ORDER — SODIUM CHLORIDE 0.9% FLUSH
3.0000 mL | Freq: Two times a day (BID) | INTRAVENOUS | Status: DC
  Administered 2024-06-30 – 2024-07-03 (×7): 3 mL via INTRAVENOUS

## 2024-06-30 MED ORDER — HYDRALAZINE HCL 20 MG/ML IJ SOLN
INTRAMUSCULAR | Status: AC
  Filled 2024-06-30: qty 1

## 2024-06-30 MED ORDER — ASPIRIN 81 MG PO CHEW: 81.0000 mg | CHEWABLE_TABLET | ORAL | Status: DC

## 2024-06-30 MED ORDER — HYDRALAZINE HCL 20 MG/ML IJ SOLN
INTRAMUSCULAR | Status: DC | PRN
  Administered 2024-06-30: 20 mg via INTRAVENOUS

## 2024-06-30 MED ORDER — ROPINIROLE HCL 1 MG PO TABS
2.0000 mg | ORAL_TABLET | Freq: Two times a day (BID) | ORAL | Status: DC
  Administered 2024-06-30 – 2024-07-03 (×7): 2 mg via ORAL
  Filled 2024-06-30 (×7): qty 2

## 2024-06-30 MED ORDER — HEPARIN SODIUM (PORCINE) 1000 UNIT/ML IJ SOLN
INTRAMUSCULAR | Status: DC | PRN
  Administered 2024-06-30: 4000 [IU] via INTRAVENOUS

## 2024-06-30 MED ORDER — LISINOPRIL 20 MG PO TABS
20.0000 mg | ORAL_TABLET | Freq: Every day | ORAL | Status: DC
  Administered 2024-06-30 – 2024-07-03 (×4): 20 mg via ORAL
  Filled 2024-06-30 (×4): qty 1

## 2024-06-30 MED ORDER — ASPIRIN 81 MG PO TBEC
81.0000 mg | DELAYED_RELEASE_TABLET | Freq: Every day | ORAL | Status: DC
  Administered 2024-06-30 – 2024-07-03 (×4): 81 mg via ORAL
  Filled 2024-06-30 (×4): qty 1

## 2024-06-30 MED ORDER — MIDAZOLAM HCL 2 MG/2ML IJ SOLN
INTRAMUSCULAR | Status: AC
  Filled 2024-06-30: qty 2

## 2024-06-30 MED ORDER — FENTANYL CITRATE (PF) 100 MCG/2ML IJ SOLN
INTRAMUSCULAR | Status: DC | PRN
  Administered 2024-06-30: 25 ug via INTRAVENOUS

## 2024-06-30 MED ORDER — ALBUTEROL SULFATE (2.5 MG/3ML) 0.083% IN NEBU
2.5000 mg | INHALATION_SOLUTION | RESPIRATORY_TRACT | Status: DC | PRN
  Administered 2024-07-01: 2.5 mg via RESPIRATORY_TRACT
  Filled 2024-06-30: qty 3

## 2024-06-30 MED ORDER — ROPINIROLE HCL 1 MG PO TABS
3.0000 mg | ORAL_TABLET | Freq: Every day | ORAL | Status: DC
  Administered 2024-06-30 – 2024-07-02 (×4): 3 mg via ORAL
  Filled 2024-06-30 (×4): qty 3

## 2024-06-30 MED ORDER — LIDOCAINE HCL (PF) 1 % IJ SOLN
INTRAMUSCULAR | Status: DC | PRN
  Administered 2024-06-30: 5 mL via INTRADERMAL

## 2024-06-30 MED ORDER — HEPARIN (PORCINE) IN NACL 1000-0.9 UT/500ML-% IV SOLN
INTRAVENOUS | Status: DC | PRN
  Administered 2024-06-30 (×2): 500 mL

## 2024-06-30 MED ORDER — NITROGLYCERIN 0.4 MG SL SUBL
SUBLINGUAL_TABLET | SUBLINGUAL | Status: AC
  Filled 2024-06-30: qty 1

## 2024-06-30 MED ORDER — IOHEXOL 350 MG/ML SOLN
INTRAVENOUS | Status: DC | PRN
  Administered 2024-06-30: 70 mL

## 2024-06-30 NOTE — Progress Notes (Signed)
 I called and spoke to his daughter over the telephone, explained that the complexity of LAD diagonal bifurcation disease that needs atherectomy or debulking potentially and complex bifurcation stenting, given his advanced age, he may not do well and potential for complications is high.  As he has TIMI-3 flow, presently not having any chest discomfort, we recommend medical therapy for now.  I also discussed with patient's daughter regarding right forearm hematoma, unlikely that he will need any surgical intervention but will continue to follow closely.  Blood pressure cuff will be applied for 30 minutes about the TR band.     Gordy Bergamo, MD, [facility] 06/30/2024, 12:17 PM [facility] [address] [place], [place] [address] Phone: [phone]. Fax:  [phone]

## 2024-06-30 NOTE — Consult Note (Signed)
 NEUROLOGY CONSULT NOTE   Date of service: June 30, 2024 Patient Name: Brandon Mason MRN:  [id number] DOB:  1925-11-30 Chief Complaint: NSTEMI Requesting Provider: Leotis Bogus, MD  History of Present Illness  Brandon Mason is a 89 y.o. male with hx of hypertension, restless leg syndrome, sleep apnea, hypothyroidism presents to the ER with complaints of increasing exertional shortness of breath ongoing for last 2 weeks and over the last couple of days patient has been experiencing imbalance on walking. He was found to have a NSTEMI. CTA Head and Neck Severe (at least 85%) stenosis at the origin of the right cervical ICA with radiologic string sign.  He denies any weakness, numbness, tingling on the left side.  He denies slurred speech, vision changes, or aphasia.  He states when he was having his imbalance and dizziness he did not notice any unilateral weakness.  Denies history of stroke however this is documented in his problem list.  He currently follows with [facility] neurology for sleep.  He was not taking any anticoagulation or antiplatelet agent.  He is currently on aspirin  81 mg and a heparin  drip.  Plan for Cath Lab later today with the cardiology team.  Difficult to obtain HPI due to hard of hearing and his hearing aides were not available during exam   ROS  Comprehensive ROS performed and pertinent positives documented in HPI   Past History   Past Medical History:  Diagnosis Date   Abnormality of gait 02/09/2013   Arthritis    Cancer (HCC)    basal cell skin cancer   Cardiomegaly    Chronic kidney disease    BPH   Chronic leg pain    Hypersomnia    Hypertension    Hypoxia    pulmonary   Neuromuscular disorder (HCC)    restless leg syndrome    Nocturia    Prostate disorder    Restless legs syndrome (RLS) 02/09/2013   Restless legs syndrome with nocturnal myoclonus    RLS (restless legs syndrome)    x 40 years   Shortness of breath    with exertion    Sleep  apnea    uses cpap   Stroke (HCC)    minor stroke in 2003 - no lasting side effects   UARS (upper airway resistance syndrome) 06/28/2014    Past Surgical History:  Procedure Laterality Date   ANTERIOR CERVICAL DECOMP/DISCECTOMY FUSION N/A 03/29/2016   Procedure: ANTERIOR CERVICAL DECOMPRESSION/DISCECTOMY FUSION CERVICAL THREE- CERVICAL FOUR;  Surgeon: Rockey Peru, MD;  Location: [facility];  Service: Neurosurgery;  Laterality: N/A;   BACK SURGERY  2003   L4-5   CATARACT EXTRACTION Bilateral    ESOPHAGOGASTRODUODENOSCOPY (EGD) WITH PROPOFOL  N/A 03/17/2022   Procedure: ESOPHAGOGASTRODUODENOSCOPY (EGD) WITH PROPOFOL ;  Surgeon: Federico Rosario BROCKS, MD;  Location: [facility] ENDOSCOPY;  Service: Gastroenterology;  Laterality: N/A;   HERNIA REPAIR     inguinal hernia   TRANSURETHRAL PROSTATECTOMY WITH GYRUS INSTRUMENTS  04/28/2012   Procedure: TRANSURETHRAL PROSTATECTOMY WITH GYRUS INSTRUMENTS;  Surgeon: Donnice Gwenyth Brooks, MD;  Location: [facility] ORS;  Service: Urology;  Laterality: N/A;   TRANSURETHRAL RESECTION OF PROSTATE  03/24/2012   Procedure: TRANSURETHRAL RESECTION OF THE PROSTATE (TURP);  Surgeon: Donnice Gwenyth Brooks, MD;  Location: [facility] ORS;  Service: Urology;  Laterality: N/A;  with gyrus ,Greenlight PVP laser of Prostate    Family History: Family History  Problem Relation Age of Onset   Restless legs syndrome Mother    Restless legs syndrome Daughter  Esophageal cancer Neg Hx    Colon cancer Neg Hx    Pancreatic cancer Neg Hx    Stomach cancer Neg Hx     Social History  reports that he has never smoked. He has never used smokeless tobacco. He reports current alcohol use of about 2.0 standard drinks of alcohol per week. He reports that he does not use drugs.  Allergies[1]  Medications  Current Medications[2]  Vitals   Vitals:   06/30/24 0700 06/30/24 0730 06/30/24 0800 06/30/24 0930  BP: (!) 173/93 (!) 185/94 (!) 167/96   Pulse: 76 77 81 86  Resp: 20 19 20 19   Temp:     97.9 F (36.6 C)  TempSrc:    Oral  SpO2: 90% 97% 98% 98%  Weight:      Height:        Body mass index is 27.37 kg/m.   Physical Exam   Constitutional: Appears well-developed and well-nourished.  Psych: Affect appropriate to situation.  Eyes: No scleral injection.  HENT: No OP obstruction.  Head: Normocephalic.  Cardiovascular: Normal rate and regular rhythm.  Respiratory: Effort normal, non-labored breathing.  GI: Soft.  No distension. There is no tenderness.  Skin: WDI.   Neurologic Examination    Neuro: Mental Status: Patient is awake, alert, oriented to person, place, month, year, and situation. Patient is able to give a clear and coherent history. No signs of aphasia or neglect Cranial Nerves: II: Visual Fields are full. Pupils are equal, round, and reactive to light.   III,IV, VI: EOMI without ptosis or diploplia.  V: Facial sensation is symmetric to temperature VII: Facial movement is symmetric resting and smiling VIII: Very hard of hearing at baseline, hearing aides not available X: Palate elevates symmetrically XI: Shoulder shrug is symmetric. XII: Tongue protrudes midline without atrophy or fasciculations.  Motor: Tone is normal. Bulk is normal. 5/5 strength was present in all four extremities.  Sensory: Sensation is symmetric to light touch and temperature in the arms and legs. No extinction to DSS present.  Deep Tendon Reflexes: 2+ and symmetric in the biceps and patellae.  Plantars: Toes are downgoing bilaterally.  Cerebellar: FNF and HKS are intact bilaterally        Labs/Imaging/Neurodiagnostic studies   CBC:  Recent Labs  Lab 07/24/24 1223 06/30/24 0701  WBC 8.2 5.9  HGB 11.7* 12.3*  HCT 35.9* 37.0*  MCV 102.6* 100.3*  PLT 248 251   Basic Metabolic Panel:  Lab Results  Component Value Date   NA 137 06/30/2024   K 4.2 06/30/2024   CO2 28 06/30/2024   GLUCOSE 98 06/30/2024   BUN 26 (H) 06/30/2024   CREATININE 0.91 06/30/2024    CALCIUM  9.3 06/30/2024   GFRNONAA >60 06/30/2024   GFRAA >60 01/20/2018   Lipid Panel:  Lab Results  Component Value Date   LDLCALC 84 05/08/2022   HgbA1c:  Lab Results  Component Value Date   HGBA1C 6.0 04/16/2017   Urine Drug Screen: No results found for: LABOPIA, COCAINSCRNUR, LABBENZ, AMPHETMU, THCU, LABBARB  Alcohol Level     Component Value Date/Time   ETH <10 08/27/2022 1752   INR  Lab Results  Component Value Date   INR 1.1 08/27/2022   CT angio Head and Neck with contrast(Personally reviewed): 1. No acute intracranial abnormality. 2. No acute large vessel occlusion. 3. Severe (at least 85%) stenosis at the origin of the right cervical ICA with radiologic string sign. Approximately 55% stenosis at the left carotid bifurcation/proximal  left cervical ICA. 4. Moderate stenosis of the bilateral cavernous ICAs. 5. 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 6. Enlarged aortopulmonary window lymph node measuring up to 1.4 cm, for which dedicated chest CT is recommended.   MRI Brain(Personally reviewed): 1. No acute intracranial abnormality. 2. Chronic small vessel ischemic change in the cerebral white matter.  ASSESSMENT   Brandon Mason is a 89 y.o. male with PMH of hypertension, restless leg syndrome, sleep apnea, hypothyroidism presents to the ER with complaints of increasing exertional shortness of breath ongoing for last 2 weeks and over the last couple of days patient has been experiencing imbalance on walking. He was found to have a NSTEMI. CTA Head and Neck Severe (at least 85%) stenosis at the origin of the right cervical ICA with radiologic string sign. Carotid stenosis appears to be asymptomatic with no focal deficits noted and no ischemia noted on MRI. VVS consulted for evaluation. Brandon Mason is currently undergoing a cardiac work up with tentative plans for a coronary angio today.   RECOMMENDATIONS  - Cardiac stabilization  - Routine  neuro checks  ______________________________________________________________________    Bonney Jorene Last, NP [facility]  I have seen the patient and reviewed the above note.  The patient has never had any symptoms referable to the right ICA, including no amaurosis fugax of the right eye, no left-sided specific symptoms.  He has a negative MRI, so even though he does have possible mild dysarthria (his mouth is also very dry) I do not think any further workup is needed.  He is already on antiplatelet therapy with aspirin  and this is adequate in the setting of asymptomatic disease.  He will need to follow-up with vascular surgery.  The family also is requesting outpatient evaluation for his neuropathy as he has been having more trouble with it lately, and therefore I will make an outpatient referral for neuropathy as well.  He is already taking pregabalin  at a low dose, but I am hesitant in the setting of recent complaints of dizziness to make any changes to that at the current time.  He also has a diagnosis of restless legs and takes multiple doses of ropinirole  throughout the day.  Though this is no longer first-line choice for restless leg syndrome, I do not know that I would make any changes to it currently.   Aisha Seals, MD [facility]   If 7pm- 7am, please page neurology on call as listed in AMION.      [1] No Known Allergies [2]  Current Facility-Administered Medications:    acetaminophen  (TYLENOL ) tablet 650 mg, 650 mg, Oral, Q6H PRN **OR** acetaminophen  (TYLENOL ) suppository 650 mg, 650 mg, Rectal, Q6H PRN, Franky Redia SAILOR, MD   albuterol  (PROVENTIL ) (2.5 MG/3ML) 0.083% nebulizer solution 2.5 mg, 2.5 mg, Inhalation, Q4H PRN, Franky Redia SAILOR, MD   aspirin  chewable tablet 81 mg, 81 mg, Oral, Pre-Cath, Ladona Heinz, MD   aspirin  EC tablet 81 mg, 81 mg, Oral, Daily, Thukkani, Arun K, MD, 81 mg at 06/30/24 0933   atorvastatin (LIPITOR)  tablet 80 mg, 80 mg, Oral, Daily, Thukkani, Arun K, MD, 80 mg at 06/30/24 9066   docusate sodium  (COLACE) capsule 100 mg, 100 mg, Oral, BID PRN, Franky Redia SAILOR, MD   finasteride  (PROSCAR ) tablet 5 mg, 5 mg, Oral, Daily, Kakrakandy, Arshad N, MD, 5 mg at 06/30/24 0932   free water 500 mL, 500 mL, Oral, Once, Ladona Heinz, MD   [START ON 07/01/2024] furosemide  (LASIX ) tablet 20  mg, 20 mg, Oral, QODAY, Franky Redia SAILOR, MD   heparin  ADULT infusion 100 units/mL (25000 units/250mL), 1,100 Units/hr, Intravenous, Continuous, Khatri, Pardeep, MD, Last Rate: 11 mL/hr at 06/30/24 0841, 1,100 Units/hr at 06/30/24 0841   hydrALAZINE (APRESOLINE) tablet 50 mg, 50 mg, Oral, BID, Franky Redia SAILOR, MD, 50 mg at 06/30/24 9066   levothyroxine (SYNTHROID) tablet 25 mcg, 25 mcg, Oral, Q0600, Kakrakandy, Arshad N, MD, 25 mcg at 06/30/24 9388   lisinopril  (ZESTRIL ) tablet 20 mg, 20 mg, Oral, Daily, Franky Redia SAILOR, MD, 20 mg at 06/30/24 9066   metoprolol tartrate (LOPRESSOR) tablet 12.5 mg, 12.5 mg, Oral, BID, Thukkani, Arun K, MD, 12.5 mg at 06/30/24 0932   pregabalin  (LYRICA ) capsule 150 mg, 150 mg, Oral, BID, Franky Redia SAILOR, MD, 150 mg at 06/30/24 9066   rOPINIRole  (REQUIP ) tablet 2 mg, 2 mg, Oral, BID, Kakrakandy, Arshad N, MD, 2 mg at 06/30/24 9068   rOPINIRole  (REQUIP ) tablet 3 mg, 3 mg, Oral, QHS, Kakrakandy, Arshad N, MD, 3 mg at 06/30/24 0200  Current Outpatient Medications:    albuterol  (VENTOLIN  HFA) 108 (90 Base) MCG/ACT inhaler, Inhale 2 puffs into the lungs every 4 (four) hours as needed., Disp: , Rfl:    cholecalciferol (VITAMIN D3) 25 MCG (1000 UNIT) tablet, Take 1,000 Units by mouth 2 (two) times daily., Disp: , Rfl:    docusate sodium  (COLACE) 100 MG capsule, Take 1 capsule (100 mg total) by mouth 2 (two) times daily. (Patient taking differently: Take 100 mg by mouth 2 (two) times daily as needed for mild constipation.), Disp: 10 capsule, Rfl: 0   finasteride  (PROSCAR ) 5 MG tablet,  TAKE 1 TABLET BY MOUTH  DAILY (Patient taking differently: Take 5 mg by mouth daily.), Disp: 90 tablet, Rfl: 1   furosemide  (LASIX ) 20 MG tablet, Take 20 mg by mouth every other day. Take 1 Tablet Every other Day., Disp: , Rfl:    hydrALAZINE (APRESOLINE) 50 MG tablet, Take 50 mg by mouth 2 (two) times daily., Disp: , Rfl:    levothyroxine (SYNTHROID) 25 MCG tablet, Take 1 tablet by mouth daily., Disp: , Rfl:    lisinopril  (ZESTRIL ) 10 MG tablet, Take 2 tablets by mouth daily., Disp: , Rfl:    Multiple Vitamins-Minerals (ONE A DAY MEN 50 PLUS PO), Take 1 tablet by mouth daily at 12 noon., Disp: , Rfl:    naloxone (NARCAN) nasal spray 4 mg/0.1 mL, , Disp: , Rfl:    nystatin  cream (MYCOSTATIN ), Apply 1 Application topically 2 (two) times daily. Apply a thin layer topically twice per day to groin area, Disp: , Rfl:    nystatin -triamcinolone  (MYCOLOG II) cream, Apply 1 Application topically 2 (two) times daily. Apply a thin layer topically to lower back and/or groin area., Disp: , Rfl:    OXYGEN , Inhale 3 L into the lungs continuous., Disp: , Rfl:    polyethylene glycol (MIRALAX  / GLYCOLAX ) 17 g packet, Take 17 g by mouth daily as needed., Disp: 14 each, Rfl: 0   prednisoLONE acetate (PRED FORTE) 1 % ophthalmic suspension, Place 1 drop into the right eye 2 (two) times daily., Disp: , Rfl:    pregabalin  (LYRICA ) 150 MG capsule, Take 1 capsule (150 mg total) by mouth 2 (two) times daily., Disp: 10 capsule, Rfl: 0   rOPINIRole  (REQUIP ) 1 MG tablet, Take 1 mg by mouth at bedtime. Take one tablet (1mg ) with one tablet (2mg ) by mouth at bedtime for a combined dose of 3mg  at bedtime., Disp: , Rfl:  rOPINIRole  (REQUIP ) 2 MG tablet, Take 2 mg by mouth 3 (three) times daily. Take one tablet (2mg ) by mouth daily at noon and 1700.  Take one tablet (2mg ) with one tablet (1mg ) by mouth at bedtime for a combined dose of 3mg  at bedtime., Disp: , Rfl:    traMADol  (ULTRAM ) 50 MG tablet, Take 1 tablet by mouth 4 (four)  times daily., Disp: , Rfl:

## 2024-06-30 NOTE — Care Plan (Signed)
 This 89 years old male with PMH significant for hypertension, restless leg syndrome, sleep apnea, hypothyroidism presented in the ED with exertional shortness of breath for last 2 weeks which has been getting worse for last couple of days.  Patient has been experiencing imbalance while walking.  Patient has followed up with PCP last month and his blood pressure was found to be on the lower side and blood pressure medications were adjusted.  Patient reports shortness of breath mainly exertional.  About a week ago he had an episode of chest pain that resolves without any intervention.  CTA head and neck shows severe stenosis of right ICA with string sign.  MRI of the brain was negative.  Patient did not have any strokelike symptoms.  Troponins came back elevated with a proBNP of 2100.  Patient was admitted for further evaluation. Cardiology and vascular surgery was consulted. Patient is scheduled to have left heart cath today.  Patient will eventually require right ICA revascularization pending cardiac workup and recovery.  Patient was seen and examined at bedside.  Patient denies any chest pain.

## 2024-06-30 NOTE — Progress Notes (Signed)
 Right radial to forearm hematoma resolved. Site is level 2 with some bruising and small blisters noted. TR band removed. No further bleeding or increase in hematoma size noted. Post activities and precautions explained. VSS. Will continue to monitor per orders and protocol.Brandon Mason E

## 2024-06-30 NOTE — Progress Notes (Addendum)
 Pt arrived on floor from cath lab. Alert and oriented X4 on arrival. IV's flushed, pt assessed, and CCMD set up. Pt in no pain or apparent distress. Previous hematoma noted on right arm near radial access site. Per RN in cath lab, hematoma has since resolved. Arm has 1+ edema and is ecchymotic. Will continue to monitor. Call light within reach.

## 2024-06-30 NOTE — Progress Notes (Signed)
 PHARMACY - ANTICOAGULATION CONSULT NOTE  Pharmacy Consult for heparin  Indication: chest pain/ACS  Allergies[1]  Patient Measurements: Height: 5' 8 (172.7 cm) Weight: 81.6 kg (180 lb) IBW/kg (Calculated) : 68.4 HEPARIN  DW (KG): 81.6  Vital Signs: Temp: 97.9 F (36.6 C) (12/31 0930) Temp Source: Oral (12/31 0930) BP: 131/72 (12/31 1415) Pulse Rate: 63 (12/31 1415)  Labs: Recent Labs    06/29/24 1223 06/30/24 0701  HGB 11.7* 12.3*  HCT 35.9* 37.0*  PLT 248 251  HEPARINUNFRC  --  <0.10*  CREATININE 1.02 0.91    Estimated Creatinine Clearance: 45.9 mL/min (by C-G formula based on SCr of 0.91 mg/dL).   Medical History: Past Medical History:  Diagnosis Date   Abnormality of gait 02/09/2013   Arthritis    Cancer (HCC)    basal cell skin cancer   Cardiomegaly    Chronic kidney disease    BPH   Chronic leg pain    Hypersomnia    Hypertension    Hypoxia    pulmonary   Neuromuscular disorder (HCC)    restless leg syndrome    Nocturia    Prostate disorder    Restless legs syndrome (RLS) 02/09/2013   Restless legs syndrome with nocturnal myoclonus    RLS (restless legs syndrome)    x 40 years   Shortness of breath    with exertion    Sleep apnea    uses cpap   Stroke [facility])    minor stroke in 2003 - no lasting side effects   UARS (upper airway resistance syndrome) 06/28/2014     Assessment: Brandon Mason is a 89 y.o. year old male admitted on 06/29/2024 with concern for NSTEMI. No anticoagulation prior to admission. Pharmacy consulted to dose heparin .  06/30/24 PM: Patient now s/p LHC. No intervention, plan for medical management. Pharmacy consulted to restart heparin  8-hour after sheath removal. Sheath removed at ~1130, will restart heparin  at prior rate at 2000. Noted patient developed right forearm hematoma while TR was being placed in the cath lab.   Goal of Therapy:  Heparin  level 0.3-0.7 units/ml Monitor platelets by anticoagulation protocol: Yes    Plan:  Restart heparin  1100 units/hr at 2000 Heparin  level with AM labs Monitor heparin  level, CBC, and s/sx of bleeding daily F/u duration of heparin   Thank you for allowing pharmacy to participate in this patient's care.  Morna Breach, PharmD, BCPS PGY2 Cardiology Pharmacy Resident 06/30/2024 3:13 PM    [1] No Known Allergies

## 2024-06-30 NOTE — ED Notes (Signed)
 Pt roved cpap stated it was uncomfortable.

## 2024-06-30 NOTE — Interval H&P Note (Signed)
 History and Physical Interval Note:  06/30/2024 10:49 AM  Brandon Mason  has presented today for surgery, with the diagnosis of NSTEMI.  The various methods of treatment have been discussed with the patient and family. After consideration of risks, benefits and other options for treatment, the patient has consented to  Procedures: LEFT HEART CATH AND CORONARY ANGIOGRAPHY (N/A) and possible coronary intervention for unstable angina as a surgical intervention.  The patient's history has been reviewed, patient examined, no change in status, stable for surgery.  I have reviewed the patient's chart and labs.  Questions were answered to the patient's satisfaction.     Gordy Bergamo

## 2024-06-30 NOTE — Progress Notes (Addendum)
 PHARMACY - ANTICOAGULATION CONSULT NOTE  Pharmacy Consult for heparin  Indication: chest pain/ACS  Allergies[1]  Patient Measurements: Height: 5' 8 (172.7 cm) Weight: 81.6 kg (180 lb) IBW/kg (Calculated) : 68.4 HEPARIN  DW (KG): 81.6  Vital Signs: Temp: 97.3 F (36.3 C) (12/31 0516) Temp Source: Axillary (12/31 0516) BP: 170/100 (12/31 0530) Pulse Rate: 79 (12/31 0530)  Labs: Recent Labs    06/29/24 1223 06/30/24 0701  HGB 11.7* 12.3*  HCT 35.9* 37.0*  PLT 248 251  CREATININE 1.02  --     Estimated Creatinine Clearance: 41 mL/min (by C-G formula based on SCr of 1.02 mg/dL).   Medical History: Past Medical History:  Diagnosis Date   Abnormality of gait 02/09/2013   Arthritis    Cancer (HCC)    basal cell skin cancer   Cardiomegaly    Chronic kidney disease    BPH   Chronic leg pain    Hypersomnia    Hypertension    Hypoxia    pulmonary   Neuromuscular disorder (HCC)    restless leg syndrome    Nocturia    Prostate disorder    Restless legs syndrome (RLS) 02/09/2013   Restless legs syndrome with nocturnal myoclonus    RLS (restless legs syndrome)    x 40 years   Shortness of breath    with exertion    Sleep apnea    uses cpap   Stroke [facility])    minor stroke in 2003 - no lasting side effects   UARS (upper airway resistance syndrome) 06/28/2014     Assessment: Brandon Mason is a 89 y.o. year old male admitted on 06/29/2024 with concern for NSTEMI. No anticoagulation prior to admission. Pharmacy consulted to dose heparin .  Heparin  level <0.10, subtherapeutic  No s/x of bleeding or issues with infusion noted.   Goal of Therapy:  Heparin  level 0.3-0.7 units/ml Monitor platelets by anticoagulation protocol: Yes   Plan:  Heparin  2000 unit bolus followed by increase heparin  drip to 1100 units/hr 8h heparin  level Daily heparin  level and CBC while on infusion F/u cardiology recs and plans for cath   Thank you for allowing pharmacy to participate in  this patient's care.  Leonor GORMAN Bash, PharmD Emergency Medicine Clinical Pharmacist 06/30/2024,7:35 AM        [1] No Known Allergies

## 2024-06-30 NOTE — H&P (View-Only) (Signed)
"  °  Progress Note  Patient Name: Brandon Mason Date of Encounter: 06/30/2024 [facility] Cardiologist: Jerel Balding, MD   Interval Summary   No acute events overnight  No recurrent chest pain  Vital Signs Vitals:   06/30/24 0212 06/30/24 0230 06/30/24 0516 06/30/24 0530  BP:  (!) 158/97  (!) 170/100  Pulse: 77 68  79  Resp: 18 13  20   Temp:   (!) 97.3 F (36.3 C)   TempSrc:   Axillary   SpO2: 95% 94%  95%  Weight:      Height:       No intake or output data in the 24 hours ending 06/30/24 0716    06/29/2024   12:12 PM 08/26/2023    9:16 AM 08/27/2022    2:17 PM  Last 3 Weights  Weight (lbs) 180 lb 181 lb 9.6 oz 173 lb  Weight (kg) 81.647 kg 82.373 kg 78.472 kg      Telemetry/ECG  SR - Personally Reviewed  Physical Exam  GEN: No acute distress.   Neck: No JVD Cardiac: RRR, no murmurs, rubs, or gallops.  Respiratory: Clear to auscultation bilaterally. GI: Soft, nontender, non-distended  MS: No edema  Assessment & Plan  ACS/NSTEMI: Had a long talk with the patient.  He has had multiple anginal symptoms over the last few months.  Interestingly these come on at rest.  They last about 15 minutes.  He looks younger than his stated age and is a fairly active individual for his age.   I think it makes sense to pursue coronary angiography and possible percutaneous coronary invention.  Certainly if he has complex or multivessel disease I think medical management should be pursued.  If he has a relatively simple culprit lesion that can be addressed by PCI then this should be pursued.  I explained this to the patient who understands and agrees with this plan.  Will refer for coronary angiography today.  The patient has a strong right radial pulse and no apparent contraindications to DAPT.   Heparin  gtt Start ASA 81mg  Start atorvastatin 80mg  Start Lopressor 12.5 mg twice daily Follow-up echo today TTE 2021 EF 60-65% Pro BNP elevated; euvolemic on exam  today Hypertension: Not under good control currently Continue lisinopril  20 mg Start Lopressor 12.5 mg twice daily HL Start atorvastatin 80mg  Chronic hypoxic respiratory failure (?) Patient on as needed supplemental oxygen  at home. Patient not on supplemental oxygen  here and saturating well. Unclear etiology; patient denies significant tobacco abuse history. Patient unsure why he is on supplemental oxygen  Carotid disease CT angio neck 85% RICA stenosis, 55% L ICA stenosis Hospital medicine to obtain vascular surgery consultation   I have reviewed the risks, indications, and alternatives to cardiac catheterization, possible angioplasty, and stenting with the patient. Risks include but are not limited to bleeding, infection, vascular injury, stroke, myocardial infection, arrhythmia, kidney injury, radiation-related injury in the case of prolonged fluoroscopy use, emergency cardiac surgery, and death. The patient understands the risks of serious complication is 1-2 in 1000 with diagnostic cardiac cath and 1-2% or less with angioplasty/stenting.    For questions or updates, please contact [facility] Please consult [email or web address] for contact info under         Signed, Browning Southwood K Melaya Hoselton, MD   "

## 2024-06-30 NOTE — Progress Notes (Signed)
 CPAP on standby at patients bedside.  Patient eating when RT went to place patient on.

## 2024-06-30 NOTE — Progress Notes (Signed)
"  °  Progress Note  Patient Name: Brandon Mason Date of Encounter: 06/30/2024 [facility] Cardiologist: Jerel Balding, MD   Interval Summary   No acute events overnight  No recurrent chest pain  Vital Signs Vitals:   06/30/24 0212 06/30/24 0230 06/30/24 0516 06/30/24 0530  BP:  (!) 158/97  (!) 170/100  Pulse: 77 68  79  Resp: 18 13  20   Temp:   (!) 97.3 F (36.3 C)   TempSrc:   Axillary   SpO2: 95% 94%  95%  Weight:      Height:       No intake or output data in the 24 hours ending 06/30/24 0716    06/29/2024   12:12 PM 08/26/2023    9:16 AM 08/27/2022    2:17 PM  Last 3 Weights  Weight (lbs) 180 lb 181 lb 9.6 oz 173 lb  Weight (kg) 81.647 kg 82.373 kg 78.472 kg      Telemetry/ECG  SR - Personally Reviewed  Physical Exam  GEN: No acute distress.   Neck: No JVD Cardiac: RRR, no murmurs, rubs, or gallops.  Respiratory: Clear to auscultation bilaterally. GI: Soft, nontender, non-distended  MS: No edema  Assessment & Plan  ACS/NSTEMI: Had a long talk with the patient.  He has had multiple anginal symptoms over the last few months.  Interestingly these come on at rest.  They last about 15 minutes.  He looks younger than his stated age and is a fairly active individual for his age.   I think it makes sense to pursue coronary angiography and possible percutaneous coronary invention.  Certainly if he has complex or multivessel disease I think medical management should be pursued.  If he has a relatively simple culprit lesion that can be addressed by PCI then this should be pursued.  I explained this to the patient who understands and agrees with this plan.  Will refer for coronary angiography today.  The patient has a strong right radial pulse and no apparent contraindications to DAPT.   Heparin  gtt Start ASA 81mg  Start atorvastatin 80mg  Start Lopressor 12.5 mg twice daily Follow-up echo today TTE 2021 EF 60-65% Pro BNP elevated; euvolemic on exam  today Hypertension: Not under good control currently Continue lisinopril  20 mg Start Lopressor 12.5 mg twice daily HL Start atorvastatin 80mg  Chronic hypoxic respiratory failure (?) Patient on as needed supplemental oxygen  at home. Patient not on supplemental oxygen  here and saturating well. Unclear etiology; patient denies significant tobacco abuse history. Patient unsure why he is on supplemental oxygen  Carotid disease CT angio neck 85% RICA stenosis, 55% L ICA stenosis Hospital medicine to obtain vascular surgery consultation   I have reviewed the risks, indications, and alternatives to cardiac catheterization, possible angioplasty, and stenting with the patient. Risks include but are not limited to bleeding, infection, vascular injury, stroke, myocardial infection, arrhythmia, kidney injury, radiation-related injury in the case of prolonged fluoroscopy use, emergency cardiac surgery, and death. The patient understands the risks of serious complication is 1-2 in 1000 with diagnostic cardiac cath and 1-2% or less with angioplasty/stenting.    For questions or updates, please contact [facility] Please consult [email or web address] for contact info under         Signed, Browning Southwood K Melaya Hoselton, MD   "

## 2024-06-30 NOTE — Consult Note (Addendum)
 " Hospital Consult    Reason for Consult: Carotid artery stenosis Requesting Physician: [facility] MRN #:  [id number]  History of Present Illness: This is a 89 y.o. male with past medical history significant for hypertension.  He is being seen in consultation for evaluation of carotid artery stenosis.  He presented to the emergency department with complaints of increasing shortness of breath with exertion over the last several weeks.  He has also had some concurrent dizziness with room spinning sensation while walking.  This has been a chronic episodic problem however this has been occurring more frequently in the last few weeks to months.  Workup included CTA of the head and neck demonstrating a radiographic string sign of the proximal right ICA as well as 55% stenosis of the left ICA.  MRI brain was negative for acute infarct.  He denies any sudden vision changes, one-sided weakness, or slurring speech.  He is not a diabetic.  He denies tobacco use.  He does not take aspirin  or statin.  He lives with his daughter and can perform IADLs.  He has history of a small stroke which was many years ago.  Past Medical History:  Diagnosis Date   Abnormality of gait 02/09/2013   Arthritis    Cancer (HCC)    basal cell skin cancer   Cardiomegaly    Chronic kidney disease    BPH   Chronic leg pain    Hypersomnia    Hypertension    Hypoxia    pulmonary   Neuromuscular disorder (HCC)    restless leg syndrome    Nocturia    Prostate disorder    Restless legs syndrome (RLS) 02/09/2013   Restless legs syndrome with nocturnal myoclonus    RLS (restless legs syndrome)    x 40 years   Shortness of breath    with exertion    Sleep apnea    uses cpap   Stroke (HCC)    minor stroke in 2003 - no lasting side effects   UARS (upper airway resistance syndrome) 06/28/2014    Past Surgical History:  Procedure Laterality Date   ANTERIOR CERVICAL DECOMP/DISCECTOMY FUSION N/A 03/29/2016   Procedure: ANTERIOR  CERVICAL DECOMPRESSION/DISCECTOMY FUSION CERVICAL THREE- CERVICAL FOUR;  Surgeon: Rockey Peru, MD;  Location: [facility]S;  Service: Neurosurgery;  Laterality: N/A;   BACK SURGERY  2003   L4-5   CATARACT EXTRACTION Bilateral    ESOPHAGOGASTRODUODENOSCOPY (EGD) WITH PROPOFOL  N/A 03/17/2022   Procedure: ESOPHAGOGASTRODUODENOSCOPY (EGD) WITH PROPOFOL ;  Surgeon: Federico Rosario BROCKS, MD;  Location: [facility] ENDOSCOPY;  Service: Gastroenterology;  Laterality: N/A;   HERNIA REPAIR     inguinal hernia   TRANSURETHRAL PROSTATECTOMY WITH GYRUS INSTRUMENTS  04/28/2012   Procedure: TRANSURETHRAL PROSTATECTOMY WITH GYRUS INSTRUMENTS;  Surgeon: Donnice Gwenyth Brooks, MD;  Location: [facility] ORS;  Service: Urology;  Laterality: N/A;   TRANSURETHRAL RESECTION OF PROSTATE  03/24/2012   Procedure: TRANSURETHRAL RESECTION OF THE PROSTATE (TURP);  Surgeon: Donnice Gwenyth Brooks, MD;  Location: [facility] ORS;  Service: Urology;  Laterality: N/A;  with gyrus ,Greenlight PVP laser of Prostate    Allergies[1]  Prior to Admission medications  Medication Sig Start Date End Date Taking? Authorizing Provider  albuterol  (VENTOLIN  HFA) 108 (90 Base) MCG/ACT inhaler Inhale 2 puffs into the lungs every 4 (four) hours as needed. 05/21/24  Yes [provider]  cholecalciferol (VITAMIN D3) 25 MCG (1000 UNIT) tablet Take 1,000 Units by mouth 2 (two) times daily.   Yes [provider]  docusate  sodium (COLACE) 100 MG capsule Take 1 capsule (100 mg total) by mouth 2 (two) times daily. Patient taking differently: Take 100 mg by mouth 2 (two) times daily as needed for mild constipation. 09/02/22  Yes Vicci Sor R, [name]  finasteride  (PROSCAR ) 5 MG tablet TAKE 1 TABLET BY MOUTH  DAILY Patient taking differently: Take 5 mg by mouth daily. 05/03/21  Yes Saguier, [place], [name]  furosemide  (LASIX ) 20 MG tablet Take 20 mg by mouth every other day. Take 1 Tablet Every other Day.   Yes [provider]  hydrALAZINE (APRESOLINE) 50 MG  tablet Take 50 mg by mouth 2 (two) times daily. 05/14/24  Yes [provider]  levothyroxine (SYNTHROID) 25 MCG tablet Take 1 tablet by mouth daily.   Yes [provider]  lisinopril  (ZESTRIL ) 10 MG tablet Take 2 tablets by mouth daily.   Yes [provider]  Multiple Vitamins-Minerals (ONE A DAY MEN 50 PLUS PO) Take 1 tablet by mouth daily at 12 noon.   Yes [provider]  naloxone (NARCAN) nasal spray 4 mg/0.1 mL    Yes [provider]  nystatin  cream (MYCOSTATIN ) Apply 1 Application topically 2 (two) times daily. Apply a thin layer topically twice per day to groin area   Yes [provider]  nystatin -triamcinolone  (MYCOLOG II) cream Apply 1 Application topically 2 (two) times daily. Apply a thin layer topically to lower back and/or groin area. 09/29/23  Yes [provider]  OXYGEN  Inhale 3 L into the lungs continuous.   Yes [provider]  polyethylene glycol (MIRALAX  / GLYCOLAX ) 17 g packet Take 17 g by mouth daily as needed. 09/02/22  Yes Vicci Sor R, [name]  prednisoLONE acetate (PRED FORTE) 1 % ophthalmic suspension Place 1 drop into the right eye 2 (two) times daily. 06/02/24  Yes [provider]  pregabalin  (LYRICA ) 150 MG capsule Take 1 capsule (150 mg total) by mouth 2 (two) times daily. 09/09/22  Yes [name], [name], [name]  rOPINIRole  (REQUIP ) 1 MG tablet Take 1 mg by mouth at bedtime. Take one tablet (1mg ) with one tablet (2mg ) by mouth at bedtime for a combined dose of 3mg  at bedtime.   Yes [provider]  rOPINIRole  (REQUIP ) 2 MG tablet Take 2 mg by mouth 3 (three) times daily. Take one tablet (2mg ) by mouth daily at noon and 1700.  Take one tablet (2mg ) with one tablet (1mg ) by mouth at bedtime for a combined dose of 3mg  at bedtime.   Yes [provider]  traMADol  (ULTRAM ) 50 MG tablet Take 1 tablet by mouth 4 (four) times daily. 09/24/22  Yes [provider]    Social History    Socioeconomic History   Marital status: Married    Spouse name: Ann   Number of children: 5   Years of education: 12   Highest education level: Not on file  Occupational History   Occupation: retired     Comment: Production Designer, Theatre/television/film for a museum/gallery curator  Tobacco Use   Smoking status: Never   Smokeless tobacco: Never  Substance and Sexual Activity   Alcohol use: Yes    Alcohol/week: 2.0 standard drinks of alcohol    Types: 2 Cans of beer per week    Comment: weekly   Drug use: No   Sexual activity: Not on file  Other Topics Concern   Not on file  Social History Narrative   Patient lives at home with his wife Jenkins.   Patient is retired.  Patient has 5 children.   Patient has a high school education.   Patient is right-handed.   Patient drinks very little caffeine.   Social Drivers of Health   Tobacco Use: Low Risk (06/30/2024)   Patient History    Smoking Tobacco Use: Never    Smokeless Tobacco Use: Never    Passive Exposure: Not on file  Financial Resource Strain: Low Risk (04/13/2024)   Received from [facility]   Overall Financial Resource Strain (CARDIA)    How hard is it for you to pay for the very basics like food, housing, medical care, and heating?: Not hard at all  Food Insecurity: No Food Insecurity (04/13/2024)   Received from [facility]   Epic    Within the past 12 months, you worried that your food would run out before you got the money to buy more.: Never true    Within the past 12 months, the food you bought just didn't last and you didn't have money to get more.: Never true  Transportation Needs: No Transportation Needs (04/13/2024)   Received from [facility]    In the past 12 months, has lack of transportation kept you from medical appointments or from getting medications?: No    In the past 12 months, has lack of transportation kept you from meetings, work, or from getting things needed for daily living?: No  Physical Activity: Inactive  (04/13/2024)   Received from [facility]   Exercise Vital Sign    On average, how many days per week do you engage in moderate to strenuous exercise (like a brisk walk)?: 0 days    Minutes of Exercise per Session: Not on file  Stress: No Stress Concern Present (04/13/2024)   Received from [facility] - Occupational Stress Questionnaire    Do you feel stress - tense, restless, nervous, or anxious, or unable to sleep at night because your mind is troubled all the time - these days?: Only a little  Social Connections: Socially Integrated (04/13/2024)   Received from [facility]   Social Network    How would you rate your social network (family, work, friends)?: Good participation with social networks  Intimate Partner Violence: Not At Risk (04/13/2024)   Received from [facility]   HITS    Over the last 12 months how often did your partner physically hurt you?: Never    Over the last 12 months how often did your partner insult you or talk down to you?: Never    Over the last 12 months how often did your partner threaten you with physical harm?: Never    Over the last 12 months how often did your partner scream or curse at you?: Never  Depression (PHQ2-9): Not on file  Alcohol Screen: Not on file  Housing: Low Risk (04/13/2024)   Received from [facility]    In the last 12 months, was there a time when you were not able to pay the mortgage or rent on time?: No    In the past 12 months, how many times have you moved where you were living?: 0    At any time in the past 12 months, were you homeless or living in a shelter (including now)?: No  Utilities: Not At Risk (04/13/2024)   Received from [facility]    In the past 12 months has the electric, gas, oil, or water company  threatened to shut off services in your home?: No  Health Literacy: Not on file    Family History  Problem Relation Age of Onset   Restless legs  syndrome Mother    Restless legs syndrome Daughter    Esophageal cancer Neg Hx    Colon cancer Neg Hx    Pancreatic cancer Neg Hx    Stomach cancer Neg Hx     ROS: Otherwise negative unless mentioned in HPI  Physical Examination  Vitals:   06/30/24 0730 06/30/24 0800  BP: (!) 185/94 (!) 167/96  Pulse: 77 81  Resp: 19 20  Temp:    SpO2: 97% 98%   Body mass index is 27.37 kg/m.  General:  WDWN in NAD Gait: Not observed HENT: WNL, normocephalic Pulmonary: normal non-labored breathing Cardiac: regular Abdomen:  soft, NT/ND, no masses Skin: without rashes Vascular Exam/Pulses: Symmetrical radial and DP pulses Extremities: without ischemic changes, without Gangrene , without cellulitis; without open wounds; left lower extremity mildly edematous with stasis pigmentation changes Musculoskeletal: no muscle wasting or atrophy  Neurologic: A&O X 3;  No focal weakness or paresthesias are detected; speech is fluent/normal Psychiatric:  The pt has Normal affect. Lymph:  Unremarkable  CBC    Component Value Date/Time   WBC 5.9 06/30/2024 0701   RBC 3.69 (L) 06/30/2024 0701   HGB 12.3 (L) 06/30/2024 0701   HGB 12.1 (L) 05/08/2022 1009   HCT 37.0 (L) 06/30/2024 0701   HCT 36.0 (L) 05/08/2022 1009   PLT 251 06/30/2024 0701   PLT 245 05/08/2022 1009   MCV 100.3 (H) 06/30/2024 0701   MCV 96 05/08/2022 1009   MCH 33.3 06/30/2024 0701   MCHC 33.2 06/30/2024 0701   RDW 13.8 06/30/2024 0701   RDW 14.9 05/08/2022 1009   LYMPHSABS 1.1 03/17/2022 1241   MONOABS 0.5 03/17/2022 1241   EOSABS 0.1 03/17/2022 1241   BASOSABS 0.4 (H) 03/17/2022 1241    BMET    Component Value Date/Time   NA 137 06/30/2024 0701   K 4.2 06/30/2024 0701   CL 100 06/30/2024 0701   CO2 28 06/30/2024 0701   GLUCOSE 98 06/30/2024 0701   BUN 26 (H) 06/30/2024 0701   CREATININE 0.91 06/30/2024 0701   CREATININE 1.15 (H) 09/10/2019 1427   CALCIUM  9.3 06/30/2024 0701   GFRNONAA >60 06/30/2024 0701    GFRNONAA >60 01/20/2018 1402   GFRAA >60 01/20/2018 1402    COAGS: Lab Results  Component Value Date   INR 1.1 08/27/2022   INR 1.0 08/27/2022     Non-Invasive Vascular Imaging:   CTA head and neck demonstrating radiographic string sign of the proximal right ICA as well as 55% stenosis of the left ICA.  2 mm anterior communicating artery aneurysm  MRI brain was negative for acute CVA    ASSESSMENT/PLAN: This is a 89 y.o. male who presented to the emergency department with increasing shortness of breath with exertion as well as dizziness  Mr. Robie Mcniel is a 89 year old male who presents to the emergency department with increasing shortness of breath with minimal exertion over the last several weeks.  Plans noted for coronary angiography later today.  He complains of concurrent dizziness and underwent CTA head and neck.  This demonstrated radiographic string sign of the proximal right ICA and 55% stenosis of the left ICA.  MRI of the brain was negative for acute CVA.  He denies any other potential strokelike symptoms including sudden vision changes, slurred speech, or one-sided weakness.  He lives with his daughter and is able to perform independent activities of daily living.  Home medication regimen does not include aspirin  or statin.  Carotid stenosis seems to be asymptomatic however will defer to neurology.  Agree with optimizing medical management with antiplatelets and statin therapy.  He will undergo coronary angiography later today.  We can discuss potential revascularization of right ICA pending cardiac workup and recovery.  On-call vascular surgeon Dr. Lanis will evaluate the patient later today and provide further treatment plans.   Donnice Sender [name] [facility] [phone]  VASCULAR STAFF ADDENDUM: I have independently interviewed and examined the patient. I agree with the above.  Patient is a 89 year old male who presented with NSTEMI.  Cardiac  cath demonstrated complex lesion, therefore no intervention was performed. From a neurovascular standpoint, he has severe right-sided ICA stenosis, but symptoms are not consistent with TIA, stroke, amaurosis.  Patient has asymptomatic carotid artery stenosis.  New data demonstrates a stroke risk of roughly 6% over 4 years with medical management.  At the age of 18, and without cardiac revascularization, I think he would be best served with continued medical management.  I called his daughter, and we discussed the above in detail.  My plan is to see him in the outpatient setting in 1 month's time to continue our discussion.  Fonda FORBES Lanis MD [facility] Phone Number: [phone] 06/30/2024 3:20 PM       [1] No Known Allergies  "

## 2024-06-30 NOTE — H&P (Addendum)
 " History and Physical    [name]:[id number] DOB: [birth year] DOA: 06/29/2024  Patient coming from: Home.  Chief Complaint: Shortness of breath and imbalance.  HPI: Brandon Mason is a 89 y.o. male with history of hypertension, restless leg syndrome, sleep apnea, hypothyroidism presents to the ER with complaints of increasing exertional shortness of breath ongoing for last 2 weeks and over the last couple of days patient has been experiencing imbalance on walking.  Patient recently followed up with primary care physician last month at that time as per the patient's daughter blood pressure was found to be in the lower side and blood pressure medicines were decreased dose.  Patient's shortness of breath mostly exertional.  Denies any associated productive cough fever chills.  About a week ago patient had an episode of chest pain retrosternal lasted for half an hour and relieved without any intervention.  Patient was admitted in September 2023 when patient had severe GI bleed at that time patient also had non-ST elevation MI attributed to acute blood loss anemia.  ED Course: In the ER patient appears nonfocal.  CT angiogram of the head and neck shows severe stenosis of the right ICA with string sign.  MRI of the brain was negative.  Patient's EKG shows chronic right bundle branch block LAFB with LVH with ST changes representing ischemia.  Troponins came back elevated at 1100 with proBNP of 2100.  Cardiology was consulted and patient was started on heparin  infusion.  Labs show hemoglobin of 11.7 creatinine 1.  Blood pressure initially was 200 systolic was given lisinopril  following which blood pressure improved.  Review of Systems: As per HPI, rest all negative.   Past Medical History:  Diagnosis Date   Abnormality of gait 02/09/2013   Arthritis    Cancer (HCC)    basal cell skin cancer   Cardiomegaly    Chronic kidney disease    BPH   Chronic leg pain    Hypersomnia    Hypertension     Hypoxia    pulmonary   Neuromuscular disorder (HCC)    restless leg syndrome    Nocturia    Prostate disorder    Restless legs syndrome (RLS) 02/09/2013   Restless legs syndrome with nocturnal myoclonus    RLS (restless legs syndrome)    x 40 years   Shortness of breath    with exertion    Sleep apnea    uses cpap   Stroke (HCC)    minor stroke in 2003 - no lasting side effects   UARS (upper airway resistance syndrome) 06/28/2014    Past Surgical History:  Procedure Laterality Date   ANTERIOR CERVICAL DECOMP/DISCECTOMY FUSION N/A 03/29/2016   Procedure: ANTERIOR CERVICAL DECOMPRESSION/DISCECTOMY FUSION CERVICAL THREE- CERVICAL FOUR;  Surgeon: Rockey Peru, MD;  Location: [facility];  Service: Neurosurgery;  Laterality: N/A;   BACK SURGERY  2003   L4-5   CATARACT EXTRACTION Bilateral    ESOPHAGOGASTRODUODENOSCOPY (EGD) WITH PROPOFOL  N/A 03/17/2022   Procedure: ESOPHAGOGASTRODUODENOSCOPY (EGD) WITH PROPOFOL ;  Surgeon: Federico Rosario BROCKS, MD;  Location: [facility] ENDOS[facility];  Service: Gastroenterology;  Laterality: N/A;   HERNIA REPAIR     inguinal hernia   TRANSURETHRAL PROSTATECTOMY WITH GYRUS INSTRUMENTS  04/28/2012   Procedure: TRANSURETHRAL PROSTATECTOMY WITH GYRUS INSTRUMENTS;  Surgeon: Donnice Gwenyth Brooks, MD;  Location: [facility];  Service: Urology;  Laterality: N/A;   TRANSURETHRAL RESECTION OF PROSTATE  03/24/2012   Procedure: TRANSURETHRAL RESECTION OF THE PROSTATE (TURP);  Surgeon: Donnice Gwenyth Eskridge,  MD;  Location: [facility];  Service: Urology;  Laterality: N/A;  with gyrus ,Greenlight PVP laser of Prostate     reports that he has never smoked. He has never used smokeless tobacco. He reports current alcohol use of about 2.0 standard drinks of alcohol per week. He reports that he does not use drugs.  Allergies[1]  Family History  Problem Relation Age of Onset   Restless legs syndrome Mother    Restless legs syndrome Daughter    Esophageal cancer Neg Hx    Colon cancer  Neg Hx    Pancreatic cancer Neg Hx    Stomach cancer Neg Hx     Prior to Admission medications  Medication Sig Start Date End Date Taking? Authorizing Provider  albuterol  (VENTOLIN  HFA) 108 (90 Base) MCG/ACT inhaler Inhale 2 puffs into the lungs every 4 (four) hours as needed. 05/21/24  Yes [provider]  cholecalciferol (VITAMIN D3) 25 MCG (1000 UNIT) tablet Take 1,000 Units by mouth 2 (two) times daily.   Yes [provider]  docusate sodium  (COLACE) 100 MG capsule Take 1 capsule (100 mg total) by mouth 2 (two) times daily. Patient taking differently: Take 100 mg by mouth 2 (two) times daily as needed for mild constipation. 09/02/22  Yes Vicci Sor R, [name]  finasteride  (PROSCAR ) 5 MG tablet TAKE 1 TABLET BY MOUTH  DAILY Patient taking differently: Take 5 mg by mouth daily. 05/03/21  Yes Saguier, [place], [name]  furosemide  (LASIX ) 20 MG tablet Take 20 mg by mouth every other day. Take 1 Tablet Every other Day.   Yes [provider]  hydrALAZINE (APRESOLINE) 50 MG tablet Take 50 mg by mouth 2 (two) times daily. 05/14/24  Yes [provider]  levothyroxine (SYNTHROID) 25 MCG tablet Take 1 tablet by mouth daily.   Yes [provider]  lisinopril  (ZESTRIL ) 10 MG tablet Take 2 tablets by mouth daily.   Yes [provider]  Multiple Vitamins-Minerals (ONE A DAY MEN 50 PLUS PO) Take 1 tablet by mouth daily at 12 noon.   Yes [provider]  naloxone (NARCAN) nasal spray 4 mg/0.1 mL    Yes [provider]  nystatin  cream (MYCOSTATIN ) Apply 1 Application topically 2 (two) times daily. Apply a thin layer topically twice per day to groin area   Yes [provider]  nystatin -triamcinolone  (MYCOLOG II) cream Apply 1 Application topically 2 (two) times daily. Apply a thin layer topically to lower back and/or groin area. 09/29/23  Yes [provider]  OXYGEN  Inhale 3 L into the lungs continuous.   Yes [provider]  polyethylene glycol (MIRALAX  / GLYCOLAX ) 17 g packet Take 17 g by mouth daily as needed. 09/02/22  Yes Vicci Sor R, [name]  prednisoLONE acetate (PRED FORTE) 1 % ophthalmic suspension Place 1 drop into the right eye 2 (two) times daily. 06/02/24  Yes [provider]  pregabalin  (LYRICA ) 150 MG capsule Take 1 capsule (150 mg total) by mouth 2 (two) times daily. 09/09/22  Yes [name], [name], [name]  rOPINIRole  (REQUIP ) 1 MG tablet Take 1 mg by mouth at bedtime. Take one tablet (1mg ) with one tablet (2mg ) by mouth at bedtime for a combined dose of 3mg  at bedtime.   Yes [provider]  rOPINIRole  (REQUIP ) 2 MG tablet Take 2 mg by mouth 3 (three) times daily. Take one tablet (2mg ) by mouth daily at noon and 1700.  Take one tablet (2mg ) with one tablet (1mg ) by mouth at  bedtime for a combined dose of 3mg  at bedtime.   Yes [provider]  traMADol  (ULTRAM ) 50 MG tablet Take 1 tablet by mouth 4 (four) times daily. 09/24/22  Yes [provider]    Physical Exam: Constitutional: Moderately built and nourished. Vitals:   06/29/24 2100 06/29/24 2136 06/29/24 2303 06/30/24 0047  BP: (!) 182/101 (!) 188/105 (!) 183/109   Pulse: 84  81   Resp: (!) 21  16   Temp:    (!) 97.3 F (36.3 C)  TempSrc:    Oral  SpO2: 96%  93%   Weight:      Height:       Eyes: Anicteric no pallor. ENMT: No discharge from the ears eyes nose or mouth. Neck: No mass felt.  No neck rigidity. Respiratory: No rhonchi or crepitations. Cardiovascular: S1 S2 heard. Abdomen: Soft nontender bowel sounds present. Musculoskeletal: No edema. Skin: No rash. Neurologic: Alert awake oriented to time place and person.  Moves all extremities. Psychiatric: Appears normal.  Normal affect.   Labs on Admission: I have personally reviewed following labs and imaging studies  CBC: Recent Labs  Lab 06/29/24 1223  WBC 8.2  HGB 11.7*  HCT 35.9*  MCV 102.6*  PLT 248   Basic Metabolic  Panel: Recent Labs  Lab 06/29/24 1223  NA 140  K 4.3  CL 101  CO2 30  GLUCOSE 83  BUN 34*  CREATININE 1.02  CALCIUM  9.3   GFR: Estimated Creatinine Clearance: 41 mL/min (by C-G formula based on SCr of 1.02 mg/dL). Liver Function Tests: No results for input(s): AST, ALT, ALKPHOS, BILITOT, PROT, ALBUMIN in the last 168 hours. No results for input(s): LIPASE, AMYLASE in the last 168 hours. No results for input(s): AMMONIA in the last 168 hours. Coagulation Profile: No results for input(s): INR, PROTIME in the last 168 hours. Cardiac Enzymes: No results for input(s): CKTOTAL, CKMB, CKMBINDEX, TROPONINI in the last 168 hours. BNP (last 3 results) Recent Labs    06/29/24 2102  PROBNP 2,135.0*   HbA1C: No results for input(s): HGBA1C in the last 72 hours. CBG: No results for input(s): GLUCAP in the last 168 hours. Lipid Profile: No results for input(s): CHOL, HDL, LDLCALC, TRIG, CHOLHDL, LDLDIRECT in the last 72 hours. Thyroid  Function Tests: No results for input(s): TSH, T4TOTAL, FREET4, T3FREE, THYROIDAB in the last 72 hours. Anemia Panel: No results for input(s): VITAMINB12, FOLATE, FERRITIN, TIBC, IRON, RETICCTPCT in the last 72 hours. Urine analysis:    Component Value Date/Time   COLORURINE STRAW (A) 06/29/2024 0004   APPEARANCEUR CLEAR 06/29/2024 0004   LABSPEC 1.010 06/29/2024 0004   PHURINE 8.0 06/29/2024 0004   GLUCOSEU NEGATIVE 06/29/2024 0004   GLUCOSEU NEGATIVE 07/23/2016 1528   HGBUR NEGATIVE 06/29/2024 0004   BILIRUBINUR NEGATIVE 06/29/2024 0004   BILIRUBINUR Negative 09/10/2018 1043   KETONESUR NEGATIVE 06/29/2024 0004   PROTEINUR NEGATIVE 06/29/2024 0004   UROBILINOGEN negative (A) 09/10/2018 1043   UROBILINOGEN 0.2 07/23/2016 1528   NITRITE NEGATIVE 06/29/2024 0004   LEUKOCYTESUR NEGATIVE 06/29/2024 0004   Sepsis Labs: @LABRCNTIP (procalcitonin:4,lacticidven:4) )No results found  for this or any previous visit (from the past 240 hours).   Radiological Exams on Admission: MR BRAIN WO CONTRAST Result Date: 06/29/2024 EXAM: MRI BRAIN WITHOUT CONTRAST 06/29/2024 10:38:00 PM TECHNIQUE: Multiplanar multisequence MRI of the head/brain was performed without the administration of intravenous contrast. COMPARISON: 05/24/2013 CLINICAL HISTORY: Headache, increasing frequency or severity. FINDINGS: BRAIN AND VENTRICLES: No acute infarct. No intracranial hemorrhage. No mass.  No midline shift. No hydrocephalus. Early confluent hyperintense T2-weighted signal within the cerebral white matter, most commonly due to chronic small vessel disease. The sella is unremarkable. Normal flow voids. ORBITS: No acute abnormality. SINUSES AND MASTOIDS: No acute abnormality. BONES AND SOFT TISSUES: Normal marrow signal. No acute soft tissue abnormality. IMPRESSION: 1. No acute intracranial abnormality. 2. Chronic small vessel ischemic change in the cerebral white matter. Electronically signed by: Franky Stanford MD 06/29/2024 11:06 PM EST RP Workstation: [id number]   CT ANGIO HEAD NECK W WO CM Result Date: 06/29/2024 EXAM: CT HEAD WITHOUT CTA HEAD AND NECK WITH AND WITHOUT 06/29/2024 01:32:38 PM TECHNIQUE: CTA of the head and neck was performed with and without the administration of 75 mL of iohexol  (OMNIPAQUE ) 350 MG/ML injection. Noncontrast CT of the head with reconstructed 2-D images are also provided for review. Multiplanar 2D and/or 3D reformatted images are provided for review. Automated exposure control, iterative reconstruction, and/or weight based adjustment of the mA/kV was utilized to reduce the radiation dose to as low as reasonably achievable. COMPARISON: CT head 08/27/2022 and MRI head 05/24/2013 CLINICAL HISTORY: Vertigo, central FINDINGS: CT HEAD: BRAIN AND VENTRICLES: No acute intracranial hemorrhage. No mass effect. No midline shift. No extra-axial fluid collection. No evidence of acute infarct.  No hydrocephalus. Similar hypoattenuation in the anterior aspect of the left basal ganglia suggestive of chronic microvascular ischemic changes and remote lacunar infarcts. Similar appearance of scattered hypoattenuation in the periventricular and subcortical white matter suggestive of moderate chronic microvascular ischemic changes. Mild parenchymal volume loss. Small remote cortical infarct in the left occipital lobe. ORBITS: Bilateral lens replacement. SINUSES AND MASTOIDS: Scattered mucosal thickening bilaterally in the maxillary and ethmoid sinuses. Hypoplastic left maxillary sinus. CTA NECK: AORTIC ARCH AND ARCH VESSELS: No dissection or arterial injury. Moderate atherosclerosis of the aortic arch. Atherosclerosis along the proximal right subclavian artery resulting in mild stenosis. No significant stenosis of the brachiocephalic artery. CERVICAL CAROTID ARTERIES: Prominent atherosclerosis at the right carotid bifurcation resulting in severe stenosis with radiologic string sign noted. There is at least 85 percent stenosis at the origin of the right cervical ICA. Atherosclerosis at the left carotid bifurcation and proximal left cervical ICA resulting in approximately 55% stenosis. No dissection or arterial injury. CERVICAL VERTEBRAL ARTERIES: The vertebral arteries are patent from the origins to the vertebrobasilar confluence. Mild tortuosity of the bilateral V1 segments. Atherosclerosis of the bilateral V4 segments resulting in mild stenosis. No dissection or arterial injury. LUNGS AND MEDIASTINUM: Centrilobular emphysema noted. There is an enlarged lymph node within the aortopulmonary window measuring up to 1.4 cm in short axis (series 11, image 165). Recommend correlation with dedicated chest CT. SOFT TISSUES: Heterogeneous appearance of the thyroid  with multiple subcentimeter nodules noted. BONES: Lucent focus within the posterior right frontal calvarium corresponding to a region of signal abnormality on  the prior MRI favored to reflect a hemangioma. Degenerative changes of the bilateral temporomandibular joints. Degenerative changes in the visualized spine with severe disc space narrowing at multiple levels in the lower cervical spine. Anterior cervical fusion hardware at C3-C4. CTA HEAD: ANTERIOR CIRCULATION: Extensive atherosclerosis of the bilateral carotid siphons. There is moderate stenosis of the bilateral cavernous ICAs. There is irregularity and mild stenosis of the M1 segment of the right MCA. The middle cerebral arteries are patent bilaterally. No significant stenosis of the anterior cerebral arteries. 2 mm anterior communicating artery aneurysm. POSTERIOR CIRCULATION: Mild to moderate stenosis of the P2 segment of the right PCA. Additional irregularity and focal moderate  stenosis of the P2 segment left PCA. No significant stenosis of the basilar artery. No significant stenosis of the vertebral arteries. No aneurysm. OTHER: No dural venous sinus thrombosis on this non-dedicated study. IMPRESSION: 1. No acute intracranial abnormality. 2. No acute large vessel occlusion. 3. Severe (at least 85%) stenosis at the origin of the right cervical ICA with radiologic string sign. Approximately 55% stenosis at the left carotid bifurcation/proximal left cervical ICA. 4. Moderate stenosis of the bilateral cavernous ICAs. 5. 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 6. Enlarged aortopulmonary window lymph node measuring up to 1.4 cm, for which dedicated chest CT is recommended. Electronically signed by: Donnice Mania MD 06/29/2024 03:28 PM EST RP Workstation: [id number]   DG Chest 2 View Result Date: 06/29/2024 EXAM: 2 VIEW(S) XRAY OF THE CHEST 06/29/2024 12:33:00 PM COMPARISON: None available. CLINICAL HISTORY: shob FINDINGS: LUNGS AND PLEURA: Low lung volumes with bibasilar atelectasis. Right mid lung linear opacity. Coarsened interstitial markings without pulmonary edema. Blunting of bilateral  costophrenic angles. No pneumothorax. HEART AND MEDIASTINUM: Cardiomegaly. Aortic calcification. BONES AND SOFT TISSUES: Cervical spine surgical hardware. No acute osseous abnormality. IMPRESSION: 1. Cardiomegaly without overt edema. 2. Blunting of bilateral costophrenic angles, possible trace bilateral effusions. 3. Low lung volumes with bibasilar atelectasis. Electronically signed by: Selinda Blue MD 06/29/2024 01:54 PM EST RP Workstation: [id number]    EKG: Independently reviewed.  Normal sinus rhythm with RBBB LAFB.  Assessment/Plan Principal Problem:   Non-ST elevation MI (NSTEMI) (HCC) Active Problems:   Restless legs syndrome (RLS)   OSA on CPAP   DNR (do not resuscitate)/DNI(Do Not Intubate)   Hypertensive urgency   Macrocytic anemia    Non-ST elevation MI -    appreciate cardiology consult.  Patient has been placed on heparin  infusion and aspirin .  Checking 2D echo.  N.p.o. in anticipation of possible cardiac cath after family decides about it. Severe right ICA stenosis with string sign.  Discussed with on-call neurologist Dr. Vanessa.  Since MRI brain is not showing at the acute advised to discuss with vascular surgeon in the morning.  Also has 2 mm anterior communicating artery aneurysm which will need follow-up.  Addendum -   discussed with Dr. Silver on-call vascular surgeon.  Vascular surgeon will see in consult but requested formal neurology consult to see if this was symptomatic. Hypertensive urgency likely contributing to patient's symptoms.  Patient is on lisinopril  hydralazine Lasix .  Follow blood pressure trends.  May need to increase the dose of some of the medications. Hypothyroidism on Synthroid. Sleep apnea on CPAP. Restless leg syndrome on Requip  and Lyrica . Macrocytic anemia with prior history of GI bleed.  Check B12 and folate follow CBC. BPH on Proscar . Enlarged lymph nodes in the chest will need dedicated CT chest.  Since patient has hypertensive urgency with  non-ST elevation MI will need further workup and more than 2 midnight stay.  DVT prophylaxis: Heparin  infusion. Code Status: DNR confirmed with patient's daughter. Family Communication: Daughter. Disposition Plan: Monitored bed. Consults called: Cardiology.  Discussed with neurology. Admission status: Inpatient.         [1] No Known Allergies  "

## 2024-07-01 ENCOUNTER — Encounter (HOSPITAL_COMMUNITY): Payer: Self-pay | Admitting: Cardiology

## 2024-07-01 DIAGNOSIS — I214 Non-ST elevation (NSTEMI) myocardial infarction: Secondary | ICD-10-CM | POA: Diagnosis not present

## 2024-07-01 LAB — HEPARIN LEVEL (UNFRACTIONATED)
Heparin Unfractionated: 0.17 [IU]/mL — ABNORMAL LOW (ref 0.30–0.70)
Heparin Unfractionated: 0.34 [IU]/mL (ref 0.30–0.70)

## 2024-07-01 LAB — URINALYSIS, ROUTINE W REFLEX MICROSCOPIC
Bacteria, UA: NONE SEEN
Bilirubin Urine: NEGATIVE
Glucose, UA: NEGATIVE mg/dL
Hgb urine dipstick: NEGATIVE
Ketones, ur: NEGATIVE mg/dL
Leukocytes,Ua: NEGATIVE
Nitrite: NEGATIVE
Protein, ur: 30 mg/dL — AB
Specific Gravity, Urine: 1.021 (ref 1.005–1.030)
pH: 7 (ref 5.0–8.0)

## 2024-07-01 LAB — CBC
HCT: 34.9 % — ABNORMAL LOW (ref 39.0–52.0)
Hemoglobin: 11.5 g/dL — ABNORMAL LOW (ref 13.0–17.0)
MCH: 33 pg (ref 26.0–34.0)
MCHC: 33 g/dL (ref 30.0–36.0)
MCV: 100.3 fL — ABNORMAL HIGH (ref 80.0–100.0)
Platelets: 232 K/uL (ref 150–400)
RBC: 3.48 MIL/uL — ABNORMAL LOW (ref 4.22–5.81)
RDW: 13.8 % (ref 11.5–15.5)
WBC: 6.7 K/uL (ref 4.0–10.5)
nRBC: 0 % (ref 0.0–0.2)

## 2024-07-01 MED ORDER — PANTOPRAZOLE SODIUM 40 MG PO TBEC
40.0000 mg | DELAYED_RELEASE_TABLET | Freq: Every day | ORAL | Status: DC
  Administered 2024-07-01 – 2024-07-03 (×3): 40 mg via ORAL
  Filled 2024-07-01 (×3): qty 1

## 2024-07-01 MED ORDER — CLOPIDOGREL BISULFATE 75 MG PO TABS
75.0000 mg | ORAL_TABLET | Freq: Every day | ORAL | Status: DC
  Administered 2024-07-01 – 2024-07-03 (×3): 75 mg via ORAL
  Filled 2024-07-01 (×3): qty 1

## 2024-07-01 NOTE — Progress Notes (Signed)
 EOS  At start of shift, pt voided to bed side commode  Reported pain to lower abdomen, stating that he needed to pee but could not - tried urinal, bedside commode, no void - bladder scan for >316ml.   Contacted on call [facility], new order for I/O cath - I/O cath completed & output  Pt slept well on CPAP   Disoriented & confused overnight, removed PIV, attempted to leave bed, unable to reorient

## 2024-07-01 NOTE — Progress Notes (Signed)
 " PROGRESS NOTE    Brandon Mason  FMW:[id number] DOB: 1926-06-18 DOA: 06/29/2024 PCP: [facility]  Brief Narrative:  This 89 years old male with PMH significant for hypertension, restless leg syndrome, sleep apnea, hypothyroidism presented in the ED with exertional shortness of breath for last 2 weeks which has been getting worse for last couple of days.  Patient has been experiencing imbalance while walking.  Patient has followed up with PCP last month and his blood pressure was found to be on the lower side and blood pressure medications were adjusted. Patient reports shortness of breath mainly exertional.  About a week ago he had an episode of chest pain that resolves without any intervention.  CTA head and neck shows severe stenosis of right ICA with string sign.  MRI of the brain was negative.  Patient did not have any strokelike symptoms.  Troponins came back elevated with a proBNP of 2100.  Patient was admitted for further evaluation. Cardiology and vascular surgery was consulted.  Patient will eventually require right ICA revascularization pending cardiac workup and recovery.  Patient is status post left heart cath, recommended medical management.  Assessment & Plan:   Principal Problem:   Non-ST elevation MI (NSTEMI) (HCC) Active Problems:   Restless legs syndrome (RLS)   OSA on CPAP   BPH (benign prostatic hyperplasia)   DNR (do not resuscitate)/DNI(Do Not Intubate)   Hypertensive urgency   Macrocytic anemia   Internal carotid artery occlusion   Hypothyroidism   Non-ST elevation MI: Appreciate cardiology consult.   Status post left heart cath, medical management recommended.   Continue IV heparin , continue aspirin  , Plavix and beta-blockers. 2D echo shows minimal reduction in the LVEF at 50 to 55%. Peak troponin was around 1200 consistent with small amount of myocardial injury. Will discontinue heparin  tomorrow.  Severe right ICA stenosis with string  sign: Admitting MD discussed with on-call neurologist Dr. Vanessa.   Since MRI brain is not showing anything acute,  advised to discuss with vascular surgeon . Vascular surgeon Dr. Silver has evaluated the patient.  Patient has severe right ICA stenosis but her symptoms are not consistent with TIA or stroke or amaurosis.  Recommended medical management.  Hypertensive urgency: Likely contributing to patient's symptoms. Patient is on lisinopril , hydralazine and Lasix .   Follow blood pressure trends.  May need to increase the dose of some of the medications.  Hypothyroidism:  Continue Synthroid.  Sleep apnea on CPAP;  Restless leg syndrome:  Continue Requip  and Lyrica .  Macrocytic anemia with prior history of GI bleed.   Check B12 and folate follow CBC.  BPH: Continue Proscar .  Enlarged lymph nodes in the chest will need dedicated CT chest.    DVT prophylaxis: Heparin  IV Code Status: DNR Family Communication: No family at bed side. Disposition Plan:    Status is: Inpatient Remains inpatient appropriate because: Patient admitted for NSTEMI, underwent left heart cath recommended medical management. Patient is not medically ready for discharge today.   Consultants:  Cardiology Vascular surgery Neurology  Procedures:LHC Antimicrobials:  Anti-infectives (From admission, onward)    None      Subjective: Patient was seen and examined at bedside.  Overnight events noted. Patient denies any chest pain reported he is doing much better.   Large ecchymosis of the right forearm but no palpable hematoma or pain or numbness in his right hand noted.  Objective: Vitals:   07/01/24 0729 07/01/24 0744 07/01/24 0919 07/01/24 1100  BP:  (!) 176/96 (!) 172/76  105/65  Pulse: 73 76 81 71  Resp: 17   (!) 24  Temp:  97.7 F (36.5 C)  98.4 F (36.9 C)  TempSrc:  Oral  Oral  SpO2: 94% 96%  93%  Weight:      Height:        Intake/Output Summary (Last 24 hours) at 07/01/2024  1441 Last data filed at 07/01/2024 1300 Gross per 24 hour  Intake 481.29 ml  Output 1390 ml  Net -908.71 ml   Filed Weights   06/29/24 1212  Weight: 81.6 kg    Examination:  General exam: Appears calm and comfortable, deconditioned, not in any acute distress. Respiratory system: CTA Bilaterally. Respiratory effort normal.  RR 14 Cardiovascular system: S1 & S2 heard, RRR. No JVD, murmurs, rubs, gallops or clicks. Gastrointestinal system: Abdomen is non distended, soft and non tender.  Normal bowel sounds heard. Central nervous system: Alert and oriented x 3. No focal neurological deficits. Extremities: No edema, no cyanosis, no clubbing. Skin: No rashes, lesions or ulcers Psychiatry: Judgement and insight appear normal. Mood & affect appropriate.   Data Reviewed: I have personally reviewed following labs and imaging studies  CBC: Recent Labs  Lab 06/29/24 1223 06/30/24 0701 07/01/24 0539  WBC 8.2 5.9 6.7  HGB 11.7* 12.3* 11.5*  HCT 35.9* 37.0* 34.9*  MCV 102.6* 100.3* 100.3*  PLT 248 251 232   Basic Metabolic Panel: Recent Labs  Lab 06/29/24 1223 06/30/24 0701  NA 140 137  K 4.3 4.2  CL 101 100  CO2 30 28  GLUCOSE 83 98  BUN 34* 26*  CREATININE 1.02 0.91  CALCIUM  9.3 9.3   GFR: Estimated Creatinine Clearance: 45.9 mL/min (by C-G formula based on SCr of 0.91 mg/dL). Liver Function Tests: Recent Labs  Lab 06/30/24 0701  AST 29  ALT 13  ALKPHOS 59  BILITOT 0.5  PROT 6.7  ALBUMIN 3.8   No results for input(s): LIPASE, AMYLASE in the last 168 hours. No results for input(s): AMMONIA in the last 168 hours. Coagulation Profile: No results for input(s): INR, PROTIME in the last 168 hours. Cardiac Enzymes: No results for input(s): CKTOTAL, CKMB, CKMBINDEX, TROPONINI in the last 168 hours. BNP (last 3 results) Recent Labs    06/29/24 2102  PROBNP 2,135.0*   HbA1C: No results for input(s): HGBA1C in the last 72 hours. CBG: No results  for input(s): GLUCAP in the last 168 hours. Lipid Profile: No results for input(s): CHOL, HDL, LDLCALC, TRIG, CHOLHDL, LDLDIRECT in the last 72 hours. Thyroid  Function Tests: No results for input(s): TSH, T4TOTAL, FREET4, T3FREE, THYROIDAB in the last 72 hours. Anemia Panel: Recent Labs    06/30/24 0701  VITAMINB12 597  FOLATE 19.1   Sepsis Labs: No results for input(s): PROCALCITON, LATICACIDVEN in the last 168 hours.  No results found for this or any previous visit (from the past 240 hours).   Radiology Studies: CARDIAC CATHETERIZATION Result Date: 06/30/2024 Images from the original result were not included. Cardiac Catheterization 06/30/2024: Hemodynamic data: EDP 10 mmHg.  No pressure gradient across aortic valve. Angiographic data: LM: Very large caliber vessel, distal left main has 10 to 20% stenosis. LCx: Very large caliber vessel giving origin to a large OM1, moderate to large AV groove circumflex and there is a 40 to 50% bifurcation stenosis in both branches.  Otherwise vessels are large and smooth. LAD: Large-caliber vessel, severely calcified in the proximal segment with irregular lumen and a 70% stenosis.  Gives origin to large D1  which is ulcerated with ostial and proximal tandem 80 to 95% stenosis.  Mid to distal LAD has mild disease. RCA: Dominant vessel and a large vessel and occluded in the proximal segment, CTO.  Contralateral collaterals from the LAD. Impression and recommendations: Complex LAD D1 bifurcation disease, suspect his marked EKG abnormality in the high lateral leads is related to diagonal disease, due to complexity of the disease and his advanced age, would recommend continued medical therapy.  He did develop right forearm hematoma while trying to place TR band due to elevated blood pressure and large wrist, hence blood pressure cuff had to be applied.-Will be restarted 8 hours post TR band removal.   MR BRAIN WO CONTRAST Result Date:  06/29/2024 EXAM: MRI BRAIN WITHOUT CONTRAST 06/29/2024 10:38:00 PM TECHNIQUE: Multiplanar multisequence MRI of the head/brain was performed without the administration of intravenous contrast. COMPARISON: 05/24/2013 CLINICAL HISTORY: Headache, increasing frequency or severity. FINDINGS: BRAIN AND VENTRICLES: No acute infarct. No intracranial hemorrhage. No mass. No midline shift. No hydrocephalus. Early confluent hyperintense T2-weighted signal within the cerebral white matter, most commonly due to chronic small vessel disease. The sella is unremarkable. Normal flow voids. ORBITS: No acute abnormality. SINUSES AND MASTOIDS: No acute abnormality. BONES AND SOFT TISSUES: Normal marrow signal. No acute soft tissue abnormality. IMPRESSION: 1. No acute intracranial abnormality. 2. Chronic small vessel ischemic change in the cerebral white matter. Electronically signed by: Franky Stanford MD 06/29/2024 11:06 PM EST RP Workstation: [id number]   Scheduled Meds:  aspirin  EC  81 mg Oral Daily   atorvastatin  80 mg Oral Daily   clopidogrel  75 mg Oral Daily   finasteride   5 mg Oral Daily   free water  500 mL Oral Once   free water  500 mL Oral Once   furosemide   20 mg Oral QODAY   hydrALAZINE  50 mg Oral BID   levothyroxine  25 mcg Oral Q0600   lisinopril   20 mg Oral Daily   metoprolol tartrate  12.5 mg Oral BID   pantoprazole   40 mg Oral Q0600   pregabalin   150 mg Oral BID   rOPINIRole   2 mg Oral BID   rOPINIRole   3 mg Oral QHS   sodium chloride  flush  3 mL Intravenous Q12H   Continuous Infusions:  heparin  1,300 Units/hr (07/01/24 1336)     LOS: 2 days    Time spent: 50 Mins    Darcel Dawley, MD [facility]   If 7PM-7AM, please contact night-coverage  "

## 2024-07-01 NOTE — Plan of Care (Signed)
" °  Problem: Education: Goal: Understanding of CV disease, CV risk reduction, and recovery process will improve Outcome: Progressing   Problem: Cardiovascular: Goal: Ability to achieve and maintain adequate cardiovascular perfusion will improve Outcome: Progressing Goal: Vascular access site(s) Level 0-1 will be maintained Outcome: Progressing   Problem: Health Behavior/Discharge Planning: Goal: Ability to safely manage health-related needs after discharge will improve Outcome: Progressing   Problem: Clinical Measurements: Goal: Respiratory complications will improve Outcome: Progressing   Problem: Nutrition: Goal: Adequate nutrition will be maintained Outcome: Progressing   "

## 2024-07-01 NOTE — Progress Notes (Signed)
 PHARMACY - ANTICOAGULATION CONSULT NOTE  Pharmacy Consult for heparin  Indication: chest pain/ACS  Allergies[1]  Patient Measurements: Height: 5' 8 (172.7 cm) Weight: 81.6 kg (180 lb) IBW/kg (Calculated) : 68.4 HEPARIN  DW (KG): 81.6  Vital Signs: Temp: 97.8 F (36.6 C) (01/01 0339) Temp Source: Axillary (01/01 0339) BP: 166/90 (01/01 0605) Pulse Rate: 74 (01/01 0605)  Labs: Recent Labs    06/29/24 1223 06/30/24 0701 07/01/24 0539  HGB 11.7* 12.3* 11.5*  HCT 35.9* 37.0* 34.9*  PLT 248 251 232  HEPARINUNFRC  --  <0.10* 0.17*  CREATININE 1.02 0.91  --     Estimated Creatinine Clearance: 45.9 mL/min (by C-G formula based on SCr of 0.91 mg/dL).   Medical History: Past Medical History:  Diagnosis Date   Abnormality of gait 02/09/2013   Arthritis    Cancer (HCC)    basal cell skin cancer   Cardiomegaly    Chronic kidney disease    BPH   Chronic leg pain    Hypersomnia    Hypertension    Hypoxia    pulmonary   Neuromuscular disorder (HCC)    restless leg syndrome    Nocturia    Prostate disorder    Restless legs syndrome (RLS) 02/09/2013   Restless legs syndrome with nocturnal myoclonus    RLS (restless legs syndrome)    x 40 years   Shortness of breath    with exertion    Sleep apnea    uses cpap   Stroke [facility])    minor stroke in 2003 - no lasting side effects   UARS (upper airway resistance syndrome) 06/28/2014     Assessment: Brandon Mason is a 89 y.o. year old male admitted on 06/29/2024 with concern for NSTEMI. No anticoagulation prior to admission. Pharmacy consulted to dose heparin .  AM: heparin  level subtherapeutic on 1100 units/hr. Per RN, no issues with heparin  running continuously or signs/symptoms of bleeding. CBC stable.  Goal of Therapy:  Heparin  level 0.3-0.7 units/ml Monitor platelets by anticoagulation protocol: Yes   Plan:  Increase heparin  1300 units/hr  Heparin  level in 8h  Monitor heparin  level, CBC, and s/sx of bleeding  daily F/u duration of heparin   Lynwood Poplar, PharmD, BCPS Clinical Pharmacist 07/01/2024 6:35 AM       [1] No Known Allergies

## 2024-07-01 NOTE — Progress Notes (Signed)
 "  Progress Note  Patient Name: Brandon Mason Date of Encounter: 07/01/2024 [facility] Cardiologist: Jerel Balding, MD   Interval Summary   Denies any chest pain or dyspnea overnight.  Did not like the hospital CPAP mask so he did not use it.  No other issues.  Has a large ecchymosis of the right forearm, but no palpable hematoma and no pain or numbness in his right hand.  Vital Signs Vitals:   07/01/24 0605 07/01/24 0700 07/01/24 0729 07/01/24 0744  BP: (!) 166/90 (!) 150/82  (!) 176/96  Pulse: 74 73 73 76  Resp: (!) 21 19 17    Temp:    97.7 F (36.5 C)  TempSrc:    Oral  SpO2: 94% (!) 86% 94% 96%  Weight:      Height:        Intake/Output Summary (Last 24 hours) at 07/01/2024 0917 Last data filed at 07/01/2024 9392 Gross per 24 hour  Intake 241.29 ml  Output 1050 ml  Net -808.71 ml      06/29/2024   12:12 PM 08/26/2023    9:16 AM 08/27/2022    2:17 PM  Last 3 Weights  Weight (lbs) 180 lb 181 lb 9.6 oz 173 lb  Weight (kg) 81.647 kg 82.373 kg 78.472 kg      Telemetry/ECG  Occasional PVCs, background normal sinus rhythm- Personally Reviewed  Personally viewed his ECG which shows sinus rhythm pre-existing bifascicular block (RBBB plus LAFB, most significant new repolarization abnormality is ST segment depression in leads I and aVL suggesting that the diagonal territory is the culprit for his non-STEMI.  Physical Exam  GEN: No acute distress.   Neck: No JVD Cardiac: RRR, no murmurs, rubs, or gallops.  Respiratory: Clear to auscultation bilaterally. GI: Soft, nontender, non-distended  MS: No edema  Assessment & Plan  CAD s/p NSTEMI: Unfavorable coronary anatomy due to bifurcation LAD lesion with a more severe stenosis involving the secondary diagonal branch.  Medical therapy recommended.  On intravenous heparin  and aspirin , will add clopidogrel and continue beta-blockers.  Blood pressure is high.  No clinical evidence of heart failure or serious ventricular  arrhythmia.  Echo shows only minimal reduction in LVEF at 50-55%.  Peak troponin was around 1200, consistent with a small amount of myocardial injury.  The troponin kinetics are fairly flat suggesting that the acute injury happened probably 48-72 hours before admission, maybe when he had the episode of indigestion on Sunday, December 28.  Stop intravenous heparin  tomorrow.  If no arrhythmia, recurrent angina or heart failure complications, probably ready for discharge tomorrow morning. HTN: A few weeks ago his blood pressure was reportedly low although he was asymptomatic (recorded systolic blood pressures at home in the 80s).  His dose of lisinopril  was reduced.  He subsequently developed dizziness and nausea and had indigestion 3 days prior to admission.  He has a significant blood pressure gradient between the right upper extremity (has known right subclavian stenosis) in the left upper extremity.  Blood pressure should always be checked on the left side.  He just received his medications today, will watch blood pressure over the next several hours. OSA: Asked his family to bring in his home CPAP equipment since he did not do well with a hospital mask. HLP: His recent labs show LDL cholesterol not quite at target at 82.  He is now on maximum dose atorvastatin. History of anemia: History of esophageal ulcers causing severe anemia, leading to a previous non-STEMI.  Recommend treatment with PPI (pantoprazole ) while on dual antiplatelet therapy.  Monitor hemoglobin while on dual antiplatelet therapy. Asymptomatic carotid artery stenosis: 85% on right, 50% on left. Right subclavian stenosis: Check blood pressures only in the left upper extremity.     For questions or updates, please contact [facility] Please consult [email or web address] for contact info under         Signed, Jerel Balding, MD   "

## 2024-07-01 NOTE — Plan of Care (Signed)
  Problem: Education: Goal: Understanding of CV disease, CV risk reduction, and recovery process will improve Outcome: Progressing Goal: Individualized Educational Video(s) Outcome: Progressing   Problem: Activity: Goal: Ability to return to baseline activity level will improve Outcome: Progressing   Problem: Cardiovascular: Goal: Ability to achieve and maintain adequate cardiovascular perfusion will improve Outcome: Progressing Goal: Vascular access site(s) Level 0-1 will be maintained Outcome: Progressing   Problem: Health Behavior/Discharge Planning: Goal: Ability to safely manage health-related needs after discharge will improve Outcome: Progressing   Problem: Education: Goal: Knowledge of General Education information will improve Description: Including pain rating scale, medication(s)/side effects and non-pharmacologic comfort measures Outcome: Progressing   Problem: Health Behavior/Discharge Planning: Goal: Ability to manage health-related needs will improve Outcome: Progressing

## 2024-07-01 NOTE — Progress Notes (Signed)
 PHARMACY - ANTICOAGULATION CONSULT NOTE  Pharmacy Consult for heparin  Indication: chest pain/ACS  Allergies[1]  Patient Measurements: Height: 5' 8 (172.7 cm) Weight: 81.6 kg (180 lb) IBW/kg (Calculated) : 68.4 HEPARIN  DW (KG): 81.6  Vital Signs: Temp: 98.4 F (36.9 C) (01/01 1100) Temp Source: Oral (01/01 1100) BP: 105/65 (01/01 1100) Pulse Rate: 77 (01/01 1409)  Labs: Recent Labs    06/29/24 1223 06/30/24 0701 07/01/24 0539 07/01/24 1434  HGB 11.7* 12.3* 11.5*  --   HCT 35.9* 37.0* 34.9*  --   PLT 248 251 232  --   HEPARINUNFRC  --  <0.10* 0.17* 0.34  CREATININE 1.02 0.91  --   --     Estimated Creatinine Clearance: 45.9 mL/min (by C-G formula based on SCr of 0.91 mg/dL).   Medical History: Past Medical History:  Diagnosis Date   Abnormality of gait 02/09/2013   Arthritis    Cancer (HCC)    basal cell skin cancer   Cardiomegaly    Chronic kidney disease    BPH   Chronic leg pain    Hypersomnia    Hypertension    Hypoxia    pulmonary   Neuromuscular disorder (HCC)    restless leg syndrome    Nocturia    Prostate disorder    Restless legs syndrome (RLS) 02/09/2013   Restless legs syndrome with nocturnal myoclonus    RLS (restless legs syndrome)    x 40 years   Shortness of breath    with exertion    Sleep apnea    uses cpap   Stroke [facility])    minor stroke in 2003 - no lasting side effects   UARS (upper airway resistance syndrome) 06/28/2014     Assessment: Brandon Mason is a 89 y.o. year old male admitted on 06/29/2024 with concern for NSTEMI. No anticoagulation prior to admission. Pharmacy consulted to dose heparin .  Heparin  level therapeutic at 0.34 on 1300 units/hr. Hgb 11.5 and PLT 232. Per RN, no issues with infusion and no signs of bleeding.    Goal of Therapy:  Heparin  level 0.3-0.7 units/ml Monitor platelets by anticoagulation protocol: Yes   Plan:  Continue heparin  at 1300 units/hr  Heparin  level in 8h  Monitor heparin  level,  CBC, and s/sx of bleeding daily   Prentice DOROTHA Favors, PharmD PGY1 Health-System Pharmacy Administration and Leadership Resident [facility]  07/01/2024 3:20 PM        [1] No Known Allergies

## 2024-07-01 NOTE — Progress Notes (Signed)
 Pt placed on home CPAP at bedside. Pt tolerating well at this time, no increased WOB noted, SPO2 92%.   07/01/24 1409  Therapy Vitals  Pulse Rate 77  Resp (!) 22  MEWS Score/Color  MEWS Score 1  MEWS Score Color Green  Respiratory Assessment  Assessment Type Assess only  Respiratory Pattern Regular;Unlabored  Chest Assessment Chest expansion symmetrical  Cough None  Bilateral Breath Sounds Clear  Oxygen  Therapy/Pulse Ox  O2 Device (S)  CPAP  SpO2 92 %

## 2024-07-02 ENCOUNTER — Other Ambulatory Visit (HOSPITAL_COMMUNITY): Payer: Self-pay

## 2024-07-02 DIAGNOSIS — I214 Non-ST elevation (NSTEMI) myocardial infarction: Secondary | ICD-10-CM | POA: Diagnosis not present

## 2024-07-02 LAB — URINALYSIS, MICROSCOPIC (REFLEX): RBC / HPF: >50 RBC/hpf (ref 0–5)

## 2024-07-02 LAB — CBC
HCT: 34.9 % — ABNORMAL LOW (ref 39.0–52.0)
Hemoglobin: 11.8 g/dL — ABNORMAL LOW (ref 13.0–17.0)
MCH: 33.4 pg (ref 26.0–34.0)
MCHC: 33.8 g/dL (ref 30.0–36.0)
MCV: 98.9 fL (ref 80.0–100.0)
Platelets: 235 K/uL (ref 150–400)
RBC: 3.53 MIL/uL — ABNORMAL LOW (ref 4.22–5.81)
RDW: 13.8 % (ref 11.5–15.5)
WBC: 8.3 K/uL (ref 4.0–10.5)
nRBC: 0 % (ref 0.0–0.2)

## 2024-07-02 LAB — URINALYSIS, ROUTINE W REFLEX MICROSCOPIC
Bilirubin Urine: NEGATIVE
Glucose, UA: NEGATIVE mg/dL
Ketones, ur: NEGATIVE mg/dL
Nitrite: POSITIVE — AB
Protein, ur: >300 mg/dL — AB
Specific Gravity, Urine: 1.015 (ref 1.005–1.030)
pH: 8 (ref 5.0–8.0)

## 2024-07-02 LAB — HEPARIN LEVEL (UNFRACTIONATED)
Heparin Unfractionated: 0.38 [IU]/mL (ref 0.30–0.70)
Heparin Unfractionated: 0.43 [IU]/mL (ref 0.30–0.70)

## 2024-07-02 LAB — LIPOPROTEIN A (LPA): Lipoprotein (a): 130.1 nmol/L — ABNORMAL HIGH

## 2024-07-02 LAB — HEMOGLOBIN AND HEMATOCRIT, BLOOD
HCT: 34.8 % — ABNORMAL LOW (ref 39.0–52.0)
Hemoglobin: 11.6 g/dL — ABNORMAL LOW (ref 13.0–17.0)

## 2024-07-02 MED ORDER — PANTOPRAZOLE SODIUM 40 MG PO TBEC
40.0000 mg | DELAYED_RELEASE_TABLET | Freq: Every day | ORAL | 0 refills | Status: DC
  Filled 2024-07-02: qty 30, 30d supply, fill #0

## 2024-07-02 MED ORDER — ATORVASTATIN CALCIUM 80 MG PO TABS
80.0000 mg | ORAL_TABLET | Freq: Every day | ORAL | 0 refills | Status: DC
  Filled 2024-07-02: qty 30, 30d supply, fill #0

## 2024-07-02 MED ORDER — ASPIRIN 81 MG PO TBEC
81.0000 mg | DELAYED_RELEASE_TABLET | Freq: Every day | ORAL | 12 refills | Status: AC
  Filled 2024-07-02: qty 30, 30d supply, fill #0

## 2024-07-02 MED ORDER — CLOPIDOGREL BISULFATE 75 MG PO TABS
75.0000 mg | ORAL_TABLET | Freq: Every day | ORAL | 0 refills | Status: DC
  Filled 2024-07-02: qty 30, 30d supply, fill #0

## 2024-07-02 MED ORDER — DEGARELIX ACETATE(240 MG DOSE) 120 MG/VIAL ~~LOC~~ SOLR
240.0000 mg | Freq: Once | SUBCUTANEOUS | Status: DC
  Filled 2024-07-02: qty 6

## 2024-07-02 MED ORDER — METOPROLOL TARTRATE 25 MG PO TABS
12.5000 mg | ORAL_TABLET | Freq: Two times a day (BID) | ORAL | 0 refills | Status: DC
  Filled 2024-07-02: qty 30, 30d supply, fill #0

## 2024-07-02 MED ORDER — OXYCODONE HCL 5 MG PO TABS
2.5000 mg | ORAL_TABLET | Freq: Once | ORAL | Status: AC
  Administered 2024-07-02: 2.5 mg via ORAL
  Filled 2024-07-02: qty 1

## 2024-07-02 NOTE — Discharge Instructions (Signed)
 Advised to follow-up with primary care physician in 1 week. Advised to continue aspirin , Plavix, Lipitor as prescribed. Advised to follow-up with cardiology as scheduled. Advised to follow-up with vascular surgery for right ICA stenosis as an outpatient.

## 2024-07-02 NOTE — Progress Notes (Signed)
 [facility] 4E[facility] Liaison Note:    This is a current home-based patient with [facility]. We can provide next business day visits, including labs, diagnostics, medications, disease management, and ongoing monitoring -- to prevent an unnecessary hospital admission.    if patient has a Home Health need upon discharge, we would request the referral to be sent to us  as this is our current patient and we would like to keep them within our care continuum.   Please call with any questions or concerns. Liaison will continue to follow for discharge disposition.    Thank you, Eleanor Nail, LPN [phone]

## 2024-07-02 NOTE — Care Plan (Signed)
 Patient was discharged today with follow up. While waiting for his family to pick him up, He has developed hematuria. Urology consulted, He will be seen by Urology, No plans for any interventions at this time. Discharge held for today. Family and patient notified.

## 2024-07-02 NOTE — Progress Notes (Addendum)
 PHARMACY - ANTICOAGULATION CONSULT NOTE  Pharmacy Consult for heparin  Indication: chest pain/ACS  Allergies[1]  Patient Measurements: Height: 5' 8 (172.7 cm) Weight: 81.6 kg (180 lb) IBW/kg (Calculated) : 68.4 HEPARIN  DW (KG): 81.6  Vital Signs: Temp: 98.3 F (36.8 C) (01/02 0428) Temp Source: Oral (01/02 0428) BP: 158/86 (01/02 0428) Pulse Rate: 72 (01/02 0428)  Labs: Recent Labs    06/29/24 1223 06/29/24 1223 06/30/24 0701 07/01/24 0539 07/01/24 1434 07/01/24 2355 07/02/24 0303  HGB 11.7*  --  12.3* 11.5*  --   --  11.8*  HCT 35.9*  --  37.0* 34.9*  --   --  34.9*  PLT 248  --  251 232  --   --  235  HEPARINUNFRC  --    < > <0.10* 0.17* 0.34 0.38 0.43  CREATININE 1.02  --  0.91  --   --   --   --    < > = values in this interval not displayed.    Estimated Creatinine Clearance: 45.9 mL/min (by C-G formula based on SCr of 0.91 mg/dL).   Assessment: Brandon Mason is a 89 y.o. year old male admitted on 06/29/2024 with concern for NSTEMI. No anticoagulation prior to admission. Pharmacy consulted to dose heparin .  07/02/2024: Heparin  level 0.43, therapeutic at heparin  1300 units/hr. No issues with infusion running or signs of bleeding per RN. CBC stable (Hgb 11.8, PLT 235). Discussed with cardiology MD, plan to stop heparin  after 48 hours.   Goal of Therapy:  Heparin  level 0.3-0.7 units/ml Monitor platelets by anticoagulation protocol: Yes   Plan:  Continue heparin  at 1300 units/hr for 48 hours (ending 07/02/24 at 2000) Monitor heparin  level, CBC, and s/sx of bleeding daily  Morna Breach, PharmD, BCPS PGY2 Cardiology Pharmacy Resident 07/02/2024 7:13 AM     [1] No Known Allergies

## 2024-07-02 NOTE — Progress Notes (Addendum)
 "  Rounding Note   Patient Name: Brandon Mason Date of Encounter: 07/02/2024  [facility] Cardiologist: Jerel Balding, MD   Subjective Pt found sleeping, no complaints, wants to go home.  Scheduled Meds:  aspirin  EC  81 mg Oral Daily   atorvastatin  80 mg Oral Daily   clopidogrel  75 mg Oral Daily   finasteride   5 mg Oral Daily   free water  500 mL Oral Once   free water  500 mL Oral Once   furosemide   20 mg Oral QODAY   hydrALAZINE  50 mg Oral BID   levothyroxine  25 mcg Oral Q0600   lisinopril   20 mg Oral Daily   metoprolol tartrate  12.5 mg Oral BID   pantoprazole   40 mg Oral Q0600   pregabalin   150 mg Oral BID   rOPINIRole   2 mg Oral BID   rOPINIRole   3 mg Oral QHS   sodium chloride  flush  3 mL Intravenous Q12H   Continuous Infusions:  heparin  1,300 Units/hr (07/02/24 0854)   PRN Meds: acetaminophen  **OR** acetaminophen , albuterol , docusate sodium , sodium chloride  flush   Vital Signs  Vitals:   07/02/24 0022 07/02/24 0350 07/02/24 0428 07/02/24 0744  BP: (!) 142/80  (!) 158/86 (!) 144/80  Pulse: 70 71 72 72  Resp: 20 17 20 18   Temp: 97.9 F (36.6 C)  98.3 F (36.8 C) 98.2 F (36.8 C)  TempSrc: Axillary  Oral Oral  SpO2: 92% 92% 93% 93%  Weight:      Height:        Intake/Output Summary (Last 24 hours) at 07/02/2024 0946 Last data filed at 07/02/2024 0745 Gross per 24 hour  Intake 993.57 ml  Output 1115 ml  Net -121.43 ml      06/29/2024   12:12 PM 08/26/2023    9:16 AM 08/27/2022    2:17 PM  Last 3 Weights  Weight (lbs) 180 lb 181 lb 9.6 oz 173 lb  Weight (kg) 81.647 kg 82.373 kg 78.472 kg      Telemetry SR with first degree HB HR in the 70s - Personally Reviewed  ECG  No new tracings - Personally Reviewed  Physical Exam  GEN: No acute distress.   Neck: No JVD Cardiac: RRR, no murmurs, rubs, or gallops.  Respiratory: Clear to auscultation bilaterally. GI: Soft, nontender, non-distended  MS: minimal B LE with chronic skin  changes Neuro:  Nonfocal  Psych: Normal affect   Labs High Sensitivity Troponin:  No results for input(s): TROPONINIHS in the last 720 hours.  Recent Labs  Lab 06/29/24 2102 06/30/24 0701 06/30/24 0843  TRNPT 1,130* 1,188* 1,223*       Chemistry Recent Labs  Lab 06/29/24 1223 06/30/24 0701  NA 140 137  K 4.3 4.2  CL 101 100  CO2 30 28  GLUCOSE 83 98  BUN 34* 26*  CREATININE 1.02 0.91  CALCIUM  9.3 9.3  PROT  --  6.7  ALBUMIN  --  3.8  AST  --  29  ALT  --  13  ALKPHOS  --  59  BILITOT  --  0.5  GFRNONAA >60 >60  ANIONGAP 8 9    Lipids No results for input(s): CHOL, TRIG, HDL, LABVLDL, LDLCALC, CHOLHDL in the last 168 hours.  Hematology Recent Labs  Lab 06/30/24 0701 07/01/24 0539 07/02/24 0303  WBC 5.9 6.7 8.3  RBC 3.69* 3.48* 3.53*  HGB 12.3* 11.5* 11.8*  HCT 37.0* 34.9* 34.9*  MCV  100.3* 100.3* 98.9  MCH 33.3 33.0 33.4  MCHC 33.2 33.0 33.8  RDW 13.8 13.8 13.8  PLT 251 232 235   Thyroid  No results for input(s): TSH, FREET4 in the last 168 hours.  BNP Recent Labs  Lab 06/29/24 2102  PROBNP 2,135.0*    DDimer No results for input(s): DDIMER in the last 168 hours.   Radiology  CARDIAC CATHETERIZATION Result Date: 06/30/2024 Images from the original result were not included. Cardiac Catheterization 06/30/2024: Hemodynamic data: EDP 10 mmHg.  No pressure gradient across aortic valve. Angiographic data: LM: Very large caliber vessel, distal left main has 10 to 20% stenosis. LCx: Very large caliber vessel giving origin to a large OM1, moderate to large AV groove circumflex and there is a 40 to 50% bifurcation stenosis in both branches.  Otherwise vessels are large and smooth. LAD: Large-caliber vessel, severely calcified in the proximal segment with irregular lumen and a 70% stenosis.  Gives origin to large D1 which is ulcerated with ostial and proximal tandem 80 to 95% stenosis.  Mid to distal LAD has mild disease. RCA: Dominant vessel and  a large vessel and occluded in the proximal segment, CTO.  Contralateral collaterals from the LAD. Impression and recommendations: Complex LAD D1 bifurcation disease, suspect his marked EKG abnormality in the high lateral leads is related to diagonal disease, due to complexity of the disease and his advanced age, would recommend continued medical therapy.  He did develop right forearm hematoma while trying to place TR band due to elevated blood pressure and large wrist, hence blood pressure cuff had to be applied.-Will be restarted 8 hours post TR band removal.    Cardiac Studies  [facility] 06/30/24: Cardiac Catheterization 06/30/2024: Hemodynamic data: EDP 10 mmHg.  No pressure gradient across aortic valve.   Angiographic data: LM: Very large caliber vessel, distal left main has 10 to 20% stenosis. LCx: Very large caliber vessel giving origin to a large OM1, moderate to large AV groove circumflex and there is a 40 to 50% bifurcation stenosis in both branches.  Otherwise vessels are large and smooth. LAD: Large-caliber vessel, severely calcified in the proximal segment with irregular lumen and a 70% stenosis.  Gives origin to large D1 which is ulcerated with ostial and proximal tandem 80 to 95% stenosis.  Mid to distal LAD has mild disease. RCA: Dominant vessel and a large vessel and occluded in the proximal segment, CTO.  Contralateral collaterals from the LAD.      Impression and recommendations: Complex LAD D1 bifurcation disease, suspect his marked EKG abnormality in the high lateral leads is related to diagonal disease, due to complexity of the disease and his advanced age, would recommend continued medical therapy.  He did develop right forearm hematoma while trying to place TR band due to elevated blood pressure and large wrist, hence blood pressure cuff had to be applied.-Will be restarted 8 hours post TR band removal.  Patient Profile    89 y.o. male with severe bifurcation disease treated  medically, HTN, HLD, OSA on CPAP, and right subclavian stenosis.   Assessment & Plan   CAD NSTEMI - heart cath with sever bifurcation disease at midLAD-D1 - opted to treat medically, as above - continue heparin  gtt x 48 hrs, ending tonight - remains on DAPT with ASA and plavix - continue BB, ACEI, statin   Hypertension - continue 20 mg lisinopril , 50 mg hydralazine BID, 12.5 mg lopressor - BP in the 140s, will monitor today   OSA  -  on CPAP at home   Right subclavian stenosis - check BP in LEFT upper extremity   Right radial hematoma following cath - expected bruising, site soft, no hematoma   I will arrange OP follow up.    ATTENDING ATTESTATION:  After conducting a review of all available clinical information with the care team, interviewing the patient, and performing a physical exam, I agree with the findings and plan described in this note with adjustments as indicated below which were discussed and enacted by staff above.   GEN: No acute distress, AO x 3 HEENT:  MMM, no JVD, no scleral icterus Cardiac: RRR, no murmurs, rubs, or gallops.  Respiratory: Clear to auscultation bilaterally. GI: Soft, nontender, non-distended  MS: No edema; No deformity. Neuro:  Nonfocal  Vasc:  +2 radial pulses; moderate RUE bruising   ACS/NSTEMI: No recurrent chest pain Reviewed cath images; complex bifurcation disease>> agree with med rx as above RUE hematoma Soft on exam, moderate bruising; monitor for now Hypertension: Continue lisnopril 20 Hydalazine 50mg  BID Lopressor 12.5mg  BID HL Atorvastatin 80mg  R subclavian stenosis Obtain BP in L arm Chronic hypoxic respiratory failure (?) Patient on as needed supplemental oxygen  at home. Patient not on supplemental oxygen  here and saturating well. Unclear etiology; patient denies significant tobacco abuse history. Patient unsure why he is on supplemental oxygen  Carotid disease CT angio neck 85% RICA stenosis, 55% L ICA  stenosis Per [facility], med rx  Discharge today with Cardiology follow up  Lurena Red, MD Pager [phone]    For questions or updates, please contact [facility] Please consult [email or web address] for contact info under       Signed, Jon Nat Hails, PA  07/02/2024, 9:46 AM    "

## 2024-07-02 NOTE — Consult Note (Signed)
 Reason for Consult: Recurrent Gross Hematuria from Prostatic Hypertrophy  Referring Physician: Darcel Dawley MD  Brandon Mason is an 89 y.o. male.   HPI:     1 - Recurrent Gross Hematuria from Prostatic Hypertrophy - pt of Dr. Nieves s/p TURP and laser procedures for BPH on finasteride  at baseline with gross hematuria after dual antiplatlet therapy for NSTEMI. He has years of off / on bleeding from his BPH. CT 2024 without stones / renal masses. Prostate vol 60gm at that time. UA without infectious parameters.  No retention or clots. Hgb 11.8 (trending up) and Cr 0.9.   PMH sig for CAD/NSTEMI, Carotid occlusion, OSA / CPAP. Lives at home with daughter, enjoys going to church and breakfast with friends.   Today  Brandon Mason is seen as consult for hematuria after ASA/plavix for medical managed NSTEMI. He has had this before. He is 89 and DNR.    Past Medical History:  Diagnosis Date   Abnormality of gait 02/09/2013   Arthritis    Cancer (HCC)    basal cell skin cancer   Cardiomegaly    Chronic kidney disease    BPH   Chronic leg pain    Hypersomnia    Hypertension    Hypoxia    pulmonary   Neuromuscular disorder (HCC)    restless leg syndrome    Nocturia    Prostate disorder    Restless legs syndrome (RLS) 02/09/2013   Restless legs syndrome with nocturnal myoclonus    RLS (restless legs syndrome)    x 40 years   Shortness of breath    with exertion    Sleep apnea    uses cpap   Stroke (HCC)    minor stroke in 2003 - no lasting side effects   UARS (upper airway resistance syndrome) 06/28/2014    Past Surgical History:  Procedure Laterality Date   ANTERIOR CERVICAL DECOMP/DISCECTOMY FUSION N/A 03/29/2016   Procedure: ANTERIOR CERVICAL DECOMPRESSION/DISCECTOMY FUSION CERVICAL THREE- CERVICAL FOUR;  Surgeon: Rockey Peru, MD;  Location: [facility];  Service: Neurosurgery;  Laterality: N/A;   BACK SURGERY  2003   L4-5   CATARACT EXTRACTION Bilateral     ESOPHAGOGASTRODUODENOSCOPY (EGD) WITH PROPOFOL  N/A 03/17/2022   Procedure: ESOPHAGOGASTRODUODENOSCOPY (EGD) WITH PROPOFOL ;  Surgeon: Federico Rosario BROCKS, MD;  Location: [facility] ENDOSCOPY;  Service: Gastroenterology;  Laterality: N/A;   HERNIA REPAIR     inguinal hernia   LEFT HEART CATH AND CORONARY ANGIOGRAPHY N/A 06/30/2024   Procedure: LEFT HEART CATH AND CORONARY ANGIOGRAPHY;  Surgeon: Ladona Heinz, MD;  Location: [facility] INVASIVE CV LAB;  Service: Cardiovascular;  Laterality: N/A;   TRANSURETHRAL PROSTATECTOMY WITH GYRUS INSTRUMENTS  04/28/2012   Procedure: TRANSURETHRAL PROSTATECTOMY WITH GYRUS INSTRUMENTS;  Surgeon: Donnice Gwenyth Nieves, MD;  Location: [facility] ORS;  Service: Urology;  Laterality: N/A;   TRANSURETHRAL RESECTION OF PROSTATE  03/24/2012   Procedure: TRANSURETHRAL RESECTION OF THE PROSTATE (TURP);  Surgeon: Donnice Gwenyth Nieves, MD;  Location: [facility] ORS;  Service: Urology;  Laterality: N/A;  with gyrus ,Greenlight PVP laser of Prostate    Family History  Problem Relation Age of Onset   Restless legs syndrome Mother    Restless legs syndrome Daughter    Esophageal cancer Neg Hx    Colon cancer Neg Hx    Pancreatic cancer Neg Hx    Stomach cancer Neg Hx     Social History:  reports that he has never smoked. He has never used smokeless tobacco. He reports current alcohol use  of about 2.0 standard drinks of alcohol per week. He reports that he does not use drugs.  Allergies: Allergies[1]  Medications: I have reviewed the patient's current medications.  Results for orders placed or performed during the hospital encounter of 06/29/24 (from the past 48 hours)  Urinalysis, Routine w reflex microscopic -Urine, Clean Catch     Status: Abnormal   Collection Time: 07/01/24  3:25 AM  Result Value Ref Range   Color, Urine YELLOW YELLOW   APPearance CLEAR CLEAR   Specific Gravity, Urine 1.021 1.005 - 1.030   pH 7.0 5.0 - 8.0   Glucose, UA NEGATIVE NEGATIVE mg/dL   Hgb urine dipstick NEGATIVE  NEGATIVE   Bilirubin Urine NEGATIVE NEGATIVE   Ketones, ur NEGATIVE NEGATIVE mg/dL   Protein, ur 30 (A) NEGATIVE mg/dL   Nitrite NEGATIVE NEGATIVE   Leukocytes,Ua NEGATIVE NEGATIVE   RBC / HPF 0-5 0 - 5 RBC/hpf   WBC, UA 0-5 0 - 5 WBC/hpf   Bacteria, UA NONE SEEN NONE SEEN   Squamous Epithelial / HPF 0-5 0 - 5 /HPF    Comment: Performed at [facility], [address], [place], [place] [address]  Heparin  level (unfractionated)     Status: Abnormal   Collection Time: 07/01/24  5:39 AM  Result Value Ref Range   Heparin  Unfractionated 0.17 (L) 0.30 - 0.70 IU/mL    Comment: (NOTE) The clinical reportable range upper limit is being lowered to >1.10 to align with the FDA approved guidance for the current laboratory assay.  If heparin  results are below expected values, and patient dosage has  been confirmed, suggest follow up testing of antithrombin III levels. Performed at [facility], [address], [place], [place] [address]   CBC     Status: Abnormal   Collection Time: 07/01/24  5:39 AM  Result Value Ref Range   WBC 6.7 4.0 - 10.5 K/uL   RBC 3.48 (L) 4.22 - 5.81 MIL/uL   Hemoglobin 11.5 (L) 13.0 - 17.0 g/dL   HCT 65.0 (L) 60.9 - 47.9 %   MCV 100.3 (H) 80.0 - 100.0 fL   MCH 33.0 26.0 - 34.0 pg   MCHC 33.0 30.0 - 36.0 g/dL   RDW 86.1 88.4 - 84.4 %   Platelets 232 150 - 400 K/uL   nRBC 0.0 0.0 - 0.2 %    Comment: Performed at [facility], [address], [place], [place] [address]  Heparin  level (unfractionated)     Status: None   Collection Time: 07/01/24  2:34 PM  Result Value Ref Range   Heparin  Unfractionated 0.34 0.30 - 0.70 IU/mL    Comment: (NOTE) The clinical reportable range upper limit is being lowered to >1.10 to align with the FDA approved guidance for the current laboratory assay.  If heparin  results are below expected values, and patient dosage has  been confirmed, suggest follow up testing of antithrombin III levels. Performed at [facility], [address] [place], [place] [address]   Heparin  level (unfractionated)     Status: None   Collection Time: 07/01/24 11:55 PM  Result Value Ref Range   Heparin  Unfractionated 0.38 0.30 - 0.70 IU/mL    Comment: (NOTE) The clinical reportable range upper limit is being lowered to >1.10 to align with the FDA approved guidance for the current laboratory assay.  If heparin  results are below expected values, and patient dosage has  been confirmed, suggest follow up testing of  antithrombin III levels. Performed at [facility], [address], [place], [place] [address]   CBC     Status: Abnormal   Collection Time: 07/02/24  3:03 AM  Result Value Ref Range   WBC 8.3 4.0 - 10.5 K/uL   RBC 3.53 (L) 4.22 - 5.81 MIL/uL   Hemoglobin 11.8 (L) 13.0 - 17.0 g/dL   HCT 65.0 (L) 60.9 - 47.9 %   MCV 98.9 80.0 - 100.0 fL   MCH 33.4 26.0 - 34.0 pg   MCHC 33.8 30.0 - 36.0 g/dL   RDW 86.1 88.4 - 84.4 %   Platelets 235 150 - 400 K/uL   nRBC 0.0 0.0 - 0.2 %    Comment: Performed at [facility], [address] [place], [place] [address]  Heparin  level (unfractionated)     Status: None   Collection Time: 07/02/24  3:03 AM  Result Value Ref Range   Heparin  Unfractionated 0.43 0.30 - 0.70 IU/mL    Comment: (NOTE) The clinical reportable range upper limit is being lowered to >1.10 to align with the FDA approved guidance for the current laboratory assay.  If heparin  results are below expected values, and patient dosage has  been confirmed, suggest follow up testing of antithrombin III levels. Performed at [facility], [address] [place], [place] [address]   Urinalysis, Routine w reflex microscopic -Urine, Clean Catch     Status: Abnormal   Collection Time: 07/02/24  2:33 PM  Result Value Ref Range   Color, Urine AMBER (A) YELLOW    Comment: BIOCHEMICALS MAY BE AFFECTED BY COLOR   APPearance TURBID (A) CLEAR   Specific Gravity, Urine 1.015 1.005 - 1.030    pH 8.0 5.0 - 8.0   Glucose, UA NEGATIVE NEGATIVE mg/dL   Hgb urine dipstick LARGE (A) NEGATIVE   Bilirubin Urine NEGATIVE NEGATIVE   Ketones, ur NEGATIVE NEGATIVE mg/dL   Protein, ur >699 (A) NEGATIVE mg/dL   Nitrite POSITIVE (A) NEGATIVE   Leukocytes,Ua LARGE (A) NEGATIVE    Comment: Performed at [facility], [address], [place], [place] [address]  Urinalysis, Microscopic (reflex)     Status: Abnormal   Collection Time: 07/02/24  2:33 PM  Result Value Ref Range   RBC / HPF >50 0 - 5 RBC/hpf   WBC, UA 6-10 0 - 5 WBC/hpf   Bacteria, UA RARE (A) NONE SEEN   Squamous Epithelial / HPF 0-5 0 - 5 /HPF    Comment: Performed at [facility], [address], [place], [place] [address]    No results found.  Review of Systems  Respiratory:  Positive for shortness of breath.   Genitourinary:  Positive for hematuria.   Blood pressure (!) 145/74, pulse 70, temperature 98.5 F (36.9 C), temperature source Oral, resp. rate (!) 24, height 5' 8 (1.727 m), weight 81.6 kg, SpO2 93%. Physical Exam Vitals reviewed.  Constitutional:      Comments: Very mentally spry, some physical frailty, daughters at bedside.   HENT:     Head: Normocephalic.  Cardiovascular:     Rate and Rhythm: Normal rate.  Pulmonary:     Comments: Mild increase WOB on NCO2 Abdominal:     Palpations: Abdomen is soft.  Genitourinary:    Comments: NO CVAT. UNcirc'd, retractile, testes down w/o masses.  Musculoskeletal:        General: Normal range of motion.  Skin:    General: Skin is warm.  Neurological:     General: No focal deficit present.     Mental Status: He is alert.  Psychiatric:        Mood and Affect: Mood normal.     Assessment/Plan:  Hematuria from BPH in setting of blood thinners. Urine remains thin and w/o clots. This will likely continue. Do NOT rec any sort of procedural intervention unless retention or sig Hgb drop. Firmagon 240mg  reqeusted x 1 which is best acute medical  management in conjuction with his baseline finasteride .   Should large / hemodynamically significant / retehtion bleeding occur despite medical maneurvers, would consdier operative clot evac and IR prostatic artery embolization. Fortunately seems unlikely.  I feel OK for DC 1/3 AM as long as no acute retention. Discussed with daughters and all in agreement.  Precautions for large clots or retention discussed with pt and daughters. Please call me directly with questions anytime,   Ricardo KATHEE Alvaro Mickey. 07/02/2024, 4:03 PM         [1] No Known Allergies

## 2024-07-02 NOTE — Progress Notes (Signed)
 " PROGRESS NOTE    Brandon Mason  FMW:[id number] DOB: 1925-11-10 DOA: 06/29/2024 PCP: [facility]  Brief Narrative:  This 89 years old male with PMH significant for hypertension, restless leg syndrome, sleep apnea, hypothyroidism presented in the ED with exertional shortness of breath for last 2 weeks which has been getting worse for last couple of days.  Patient has been experiencing imbalance while walking.  Patient has followed up with PCP last month and his blood pressure was found to be on the lower side and blood pressure medications were adjusted. Patient reports shortness of breath mainly exertional.  About a week ago he had an episode of chest pain that resolves without any intervention.  CTA head and neck shows severe stenosis of right ICA with string sign.  MRI of the brain was negative.  Patient did not have any strokelike symptoms.  Troponins came back elevated with a proBNP of 2100.  Patient was admitted for further evaluation. Cardiology and vascular surgery was consulted.  Patient will eventually require right ICA revascularization pending cardiac workup and recovery.  Patient is status post left heart cath, recommended medical management.  Assessment & Plan:   Principal Problem:   Non-ST elevation MI (NSTEMI) (HCC) Active Problems:   Restless legs syndrome (RLS)   OSA on CPAP   BPH (benign prostatic hyperplasia)   DNR (do not resuscitate)/DNI(Do Not Intubate)   Hypertensive urgency   Macrocytic anemia   Internal carotid artery occlusion   Hypothyroidism   Non-ST elevation MI: Appreciate cardiology consult.   Status post left heart cath, medical management recommended.   Continued on IV heparin ,  aspirin  , Plavix and beta-blockers added. 2D echo shows minimal reduction in the LVEF at 50 to 55%. Peak troponin was around 1200 consistent with small amount of myocardial injury. Heparin  discontinued.  Cardiology signed off.  Severe right ICA stenosis with  string sign: Admitting MD discussed with on-call neurologist Dr. Vanessa.   Since MRI brain is not showing anything acute,  advised to discuss with vascular surgeon . Vascular surgeon Dr. Silver has evaluated the patient.  Patient has severe right ICA stenosis but her symptoms are not consistent with TIA or stroke or amaurosis.  Recommended medical management.  Hypertensive urgency: Likely contributing to patient's symptoms. Patient is on lisinopril , hydralazine and Lasix .   Follow blood pressure trends.  May need to increase the dose of some of the medications.  Hypothyroidism:  Continue Synthroid.  Sleep apnea on CPAP.  Restless leg syndrome:  Continue Requip  and Lyrica .  Macrocytic anemia with prior history of GI bleed.   Check B12 and folate follow CBC.  BPH: Continue Proscar .  Enlarged lymph nodes in the chest will need dedicated CT chest.  Hematuria: Patient was discharged but then has developed hematuria.  Heparin , aspirin  and Plavix held. Discharge held.  Monitor H&H. Urology is consulted,  No plans for any intervention at this time.    DVT prophylaxis: Heparin  IV Code Status: DNR Family Communication: No family at bed side. Disposition Plan:    Status is: Inpatient Remains inpatient appropriate because: Patient admitted for NSTEMI, underwent left heart cath recommended medical management.  Patient is not medically ready for discharge today.   Consultants:  Cardiology Vascular surgery Neurology  Procedures:LHC Antimicrobials:  Anti-infectives (From admission, onward)    None      Subjective: Patient was seen and examined at bedside.  Overnight events noted. Patient denies any chest pain,  reports he is doing much better.  Patient was discharged but then has developed hematuria likely in the setting of heparin  use. Urology is consulted.  Objective: Vitals:   07/02/24 1158 07/02/24 1200 07/02/24 1300 07/02/24 1618  BP: 129/66 (!) 142/75 (!)  145/74 (!) 146/74  Pulse: 70 68 70 77  Resp: 20 19 (!) 24 (!) 27  Temp: 98.5 F (36.9 C)   98.7 F (37.1 C)  TempSrc: Oral   Oral  SpO2: 94% 98% 93% 95%  Weight:      Height:        Intake/Output Summary (Last 24 hours) at 07/02/2024 1626 Last data filed at 07/02/2024 1313 Gross per 24 hour  Intake 753.57 ml  Output 1100 ml  Net -346.43 ml   Filed Weights   06/29/24 1212  Weight: 81.6 kg    Examination:  General exam: Appears calm and comfortable, deconditioned, not in any acute distress. Respiratory system: CTA Bilaterally. Respiratory effort normal.  RR 14 Cardiovascular system: S1 & S2 heard, RRR. No JVD, murmurs, rubs, gallops or clicks.  Gastrointestinal system: Abdomen is non distended, soft and non tender.  Normal bowel sounds heard. Central nervous system: Alert and oriented x 3. No focal neurological deficits. Extremities: No edema, no cyanosis, no clubbing. Skin: No rashes, lesions or ulcers Psychiatry: Judgement and insight appear normal. Mood & affect appropriate.   Data Reviewed: I have personally reviewed following labs and imaging studies  CBC: Recent Labs  Lab 06/29/24 1223 06/30/24 0701 07/01/24 0539 07/02/24 0303  WBC 8.2 5.9 6.7 8.3  HGB 11.7* 12.3* 11.5* 11.8*  HCT 35.9* 37.0* 34.9* 34.9*  MCV 102.6* 100.3* 100.3* 98.9  PLT 248 251 232 235   Basic Metabolic Panel: Recent Labs  Lab 06/29/24 1223 06/30/24 0701  NA 140 137  K 4.3 4.2  CL 101 100  CO2 30 28  GLUCOSE 83 98  BUN 34* 26*  CREATININE 1.02 0.91  CALCIUM  9.3 9.3   GFR: Estimated Creatinine Clearance: 45.9 mL/min (by C-G formula based on SCr of 0.91 mg/dL). Liver Function Tests: Recent Labs  Lab 06/30/24 0701  AST 29  ALT 13  ALKPHOS 59  BILITOT 0.5  PROT 6.7  ALBUMIN 3.8   No results for input(s): LIPASE, AMYLASE in the last 168 hours. No results for input(s): AMMONIA in the last 168 hours. Coagulation Profile: No results for input(s): INR, PROTIME in the  last 168 hours. Cardiac Enzymes: No results for input(s): CKTOTAL, CKMB, CKMBINDEX, TROPONINI in the last 168 hours. BNP (last 3 results) Recent Labs    06/29/24 2102  PROBNP 2,135.0*   HbA1C: No results for input(s): HGBA1C in the last 72 hours. CBG: No results for input(s): GLUCAP in the last 168 hours. Lipid Profile: No results for input(s): CHOL, HDL, LDLCALC, TRIG, CHOLHDL, LDLDIRECT in the last 72 hours. Thyroid  Function Tests: No results for input(s): TSH, T4TOTAL, FREET4, T3FREE, THYROIDAB in the last 72 hours. Anemia Panel: Recent Labs    06/30/24 0701  VITAMINB12 597  FOLATE 19.1   Sepsis Labs: No results for input(s): PROCALCITON, LATICACIDVEN in the last 168 hours.  No results found for this or any previous visit (from the past 240 hours).   Radiology Studies: No results found.  Scheduled Meds:  aspirin  EC  81 mg Oral Daily   atorvastatin  80 mg Oral Daily   clopidogrel  75 mg Oral Daily   degarelix  240 mg Subcutaneous Once   finasteride   5 mg Oral Daily   free water  500 mL  Oral Once   free water  500 mL Oral Once   furosemide   20 mg Oral QODAY   hydrALAZINE  50 mg Oral BID   levothyroxine  25 mcg Oral Q0600   lisinopril   20 mg Oral Daily   metoprolol tartrate  12.5 mg Oral BID   pantoprazole   40 mg Oral Q0600   pregabalin   150 mg Oral BID   rOPINIRole   2 mg Oral BID   rOPINIRole   3 mg Oral QHS   sodium chloride  flush  3 mL Intravenous Q12H   Continuous Infusions:     LOS: 3 days    Time spent: 50 Mins    Darcel Dawley, MD [facility]   If 7PM-7AM, please contact night-coverage  "

## 2024-07-02 NOTE — Progress Notes (Signed)
 PHARMACY - ANTICOAGULATION CONSULT NOTE  Pharmacy Consult for heparin  Indication: chest pain/ACS  Allergies[1]  Patient Measurements: Height: 5' 8 (172.7 cm) Weight: 81.6 kg (180 lb) IBW/kg (Calculated) : 68.4 HEPARIN  DW (KG): 81.6  Vital Signs: Temp: 97.9 F (36.6 C) (01/02 0022) Temp Source: Axillary (01/02 0022) BP: 142/80 (01/02 0022) Pulse Rate: 70 (01/02 0022)  Labs: Recent Labs    06/29/24 1223 06/29/24 1223 06/30/24 0701 07/01/24 0539 07/01/24 1434 07/01/24 2355  HGB 11.7*  --  12.3* 11.5*  --   --   HCT 35.9*  --  37.0* 34.9*  --   --   PLT 248  --  251 232  --   --   HEPARINUNFRC  --    < > <0.10* 0.17* 0.34 0.38  CREATININE 1.02  --  0.91  --   --   --    < > = values in this interval not displayed.    Estimated Creatinine Clearance: 45.9 mL/min (by C-G formula based on SCr of 0.91 mg/dL).   Assessment: Brandon Mason is a 89 y.o. year old male admitted on 06/29/2024 with concern for NSTEMI. No anticoagulation prior to admission. Pharmacy consulted to dose heparin .  Confirmatory Heparin  level therapeutic at 0.38 on 1300 units/hr. Hgb 11.5 and PLT 232. No issues with infusion and no signs of bleeding reported.    Goal of Therapy:  Heparin  level 0.3-0.7 units/ml Monitor platelets by anticoagulation protocol: Yes   Plan:  Continue heparin  at 1300 units/hr  Monitor heparin  level, CBC, and s/sx of bleeding daily   Lynwood Poplar, PharmD, BCPS Clinical Pharmacist 07/02/2024 12:26 AM          [1] No Known Allergies

## 2024-07-02 NOTE — Plan of Care (Signed)
" °  Problem: Activity: Goal: Ability to return to baseline activity level will improve Outcome: Progressing   Problem: Cardiovascular: Goal: Ability to achieve and maintain adequate cardiovascular perfusion will improve Outcome: Progressing   Problem: Education: Goal: Knowledge of General Education information will improve Description: Including pain rating scale, medication(s)/side effects and non-pharmacologic comfort measures Outcome: Progressing   Problem: Clinical Measurements: Goal: Respiratory complications will improve Outcome: Progressing   Problem: Activity: Goal: Risk for activity intolerance will decrease Outcome: Progressing   Problem: Nutrition: Goal: Adequate nutrition will be maintained Outcome: Progressing   Problem: Coping: Goal: Level of anxiety will decrease Outcome: Progressing   "

## 2024-07-02 NOTE — Discharge Summary (Signed)
 Physician Discharge Summary  Brandon Mason FMW:[id number] DOB: 1926-04-27 DOA: 06/29/2024  PCP: [facility]  Admit date: 06/29/2024  Discharge date: 07/03/24  Admitted From: Home  Disposition: Home Health Services  Recommendations for Outpatient Follow-up:  Follow up with PCP in 1-2 weeks. Please obtain BMP/CBC in one week. Advised to continue aspirin , Plavix , and Lipitor as prescribed. Advised to follow-up with Cardiology as scheduled. Advised to follow-up with vascular surgery for right ICA stenosis as an outpatient.  Home Health:None Equipment/Devices:None  Discharge Condition: Stable CODE STATUS:DNR Diet recommendation: Carb Modified   Brief Summary/ Hospital Course: This 89 years old male with PMH significant for hypertension, restless leg syndrome, sleep apnea, hypothyroidism presented in the ED with exertional shortness of breath for last 2 weeks which has been getting worse for last couple of days.  Patient has been experiencing imbalance while walking.  Patient has followed up with PCP last month and his blood pressure was found to be on the lower side and blood pressure medications were adjusted. Patient reports shortness of breath mainly exertional.  About a week ago he had an episode of chest pain that resolves without any intervention.  CTA head and neck shows severe stenosis of right ICA with string sign.  MRI of the brain was negative.  Patient did not have any strokelike symptoms.  Troponins came back elevated with a proBNP of 2100.  Patient was admitted for further evaluation. Cardiology and vascular surgery was consulted.  Patient will eventually require right ICA revascularization pending cardiac workup and recovery.  Patient is status post left heart cath, recommended medical management.  Patient should continue aspirin ,  Plavix  and Lipitor. Heparin  was discontinued.  Blood pressure medications were adjusted.  Patient was cleared from cardiology to be  discharged and then patient has an episode of hematuria.  Urology was consulted, subsequently next day hematuria resolved ,   urine got clear.  Patient is cleared for discharge.  Home health services arranged.  Discharge Diagnoses:  Principal Problem:   Non-ST elevation MI (NSTEMI) (HCC) Active Problems:   Restless legs syndrome (RLS)   OSA on CPAP   BPH (benign prostatic hyperplasia)   DNR (do not resuscitate)/DNI(Do Not Intubate)   Hypertensive urgency   Macrocytic anemia   Internal carotid artery occlusion   Hypothyroidism  Non-ST elevation MI: Appreciate cardiology consult.   Status post left heart cath, medical management recommended.   Continue IV heparin , continue aspirin  , Plavix  and beta-blockers. 2D echo shows minimal reduction in the LVEF at 50 to 55%. Peak troponin was around 1200 consistent with small amount of myocardial injury. Heparin  discontinued.  Cardiology signed off.   Severe right ICA stenosis with string sign: Admitting MD discussed with on-call neurologist Dr. Vanessa.   Since MRI brain is not showing anything acute,  advised to discuss with vascular surgeon . Vascular surgeon Dr. Silver has evaluated the patient.  Patient has severe right ICA stenosis but her symptoms are not consistent with TIA or stroke or amaurosis.  Recommended medical management and outpatient follow-up.   Hypertensive urgency: Likely contributing to patient's symptoms. Patient is on lisinopril , hydralazine  and Lasix .   Follow blood pressure trends.  May need to increase the dose of some of the medications.   Hypothyroidism:  Continue Synthroid .   Sleep apnea on CPAP;   Restless leg syndrome:  Continue Requip  and Lyrica .   Macrocytic anemia with prior history of GI bleed.   B12 and folate normal.   BPH: Continue Proscar .  Enlarged lymph nodes in the chest will need dedicated CT chest.  Hematuria: Patient was discharged but then has developed hematuria.  Heparin , aspirin   and Plavix  held. Discharge held.  Monitor H&H. Urology is consulted,  No plans for any intervention at this time. Hematuria resolved,  urine got clear.   Discharge Instructions  Discharge Instructions     Ambulatory referral to Neurology   Complete by: As directed    An appointment is requested in approximately: 2 weeks   Call MD for:  difficulty breathing, headache or visual disturbances   Complete by: As directed    Call MD for:  persistant dizziness or light-headedness   Complete by: As directed    Call MD for:  persistant nausea and vomiting   Complete by: As directed    Diet general   Complete by: As directed    Discharge instructions   Complete by: As directed    Advised to follow-up with primary care physician in 1 week. Advised to continue aspirin , Plavix , Lipitor as prescribed. Advised to follow-up with cardiology as scheduled. Advised to follow-up with vascular surgery for right ICA stenosis as an outpatient.   Increase activity slowly   Complete by: As directed       Allergies as of 07/03/2024   No Known Allergies      Medication List     TAKE these medications    rOPINIRole  1 MG tablet Commonly known as: REQUIP  Take 1 mg by mouth at bedtime. Take one tablet (1mg ) with one tablet (2mg ) by mouth at bedtime for a combined dose of 3mg  at bedtime. The timing of this medication is very important.   rOPINIRole  2 MG tablet Commonly known as: REQUIP  Take 2 mg by mouth 3 (three) times daily. Take one tablet (2mg ) by mouth daily at noon and 1700.  Take one tablet (2mg ) with one tablet (1mg ) by mouth at bedtime for a combined dose of 3mg  at bedtime. The timing of this medication is very important.   albuterol  108 (90 Base) MCG/ACT inhaler Commonly known as: VENTOLIN  HFA Inhale 2 puffs into the lungs every 4 (four) hours as needed.   Aspirin  EC Adult Low Dose 81 MG tablet Generic drug: aspirin  EC Take 1 tablet (81 mg total) by mouth daily. Swallow whole.    atorvastatin  80 MG tablet Commonly known as: LIPITOR Take 1 tablet (80 mg total) by mouth daily.   cholecalciferol 25 MCG (1000 UNIT) tablet Commonly known as: VITAMIN D3 Take 1,000 Units by mouth 2 (two) times daily.   clopidogrel  75 MG tablet Commonly known as: PLAVIX  Take 1 tablet (75 mg total) by mouth daily.   docusate sodium  100 MG capsule Commonly known as: COLACE Take 1 capsule (100 mg total) by mouth 2 (two) times daily. What changed:  when to take this reasons to take this   finasteride  5 MG tablet Commonly known as: PROSCAR  TAKE 1 TABLET BY MOUTH  DAILY   furosemide  20 MG tablet Commonly known as: LASIX  Take 20 mg by mouth every other day. Take 1 Tablet Every other Day.   hydrALAZINE  50 MG tablet Commonly known as: APRESOLINE  Take 50 mg by mouth 2 (two) times daily.   levothyroxine  25 MCG tablet Commonly known as: SYNTHROID  Take 1 tablet by mouth daily.   lisinopril  10 MG tablet Commonly known as: ZESTRIL  Take 2 tablets by mouth daily.   metoprolol  tartrate 25 MG tablet Commonly known as: LOPRESSOR  Take 0.5 tablets (12.5 mg total) by mouth 2 (  two) times daily.   naloxone 4 MG/0.1ML Liqd nasal spray kit Commonly known as: NARCAN   nystatin  cream Commonly known as: MYCOSTATIN  Apply 1 Application topically 2 (two) times daily. Apply a thin layer topically twice per day to groin area   nystatin -triamcinolone  cream Commonly known as: MYCOLOG II Apply 1 Application topically 2 (two) times daily. Apply a thin layer topically to lower back and/or groin area.   ONE A DAY MEN 50 PLUS PO Take 1 tablet by mouth daily at 12 noon.   OXYGEN  Inhale 3 L into the lungs continuous.   pantoprazole  40 MG tablet Commonly known as: PROTONIX  Take 1 tablet (40 mg total) by mouth daily at 6 (six) AM.   polyethylene glycol 17 g packet Commonly known as: MIRALAX  / GLYCOLAX  Take 17 g by mouth daily as needed.   prednisoLONE acetate 1 % ophthalmic  suspension Commonly known as: PRED FORTE Place 1 drop into the right eye 2 (two) times daily.   pregabalin  150 MG capsule Commonly known as: LYRICA  Take 1 capsule (150 mg total) by mouth 2 (two) times daily.   traMADol  50 MG tablet Commonly known as: ULTRAM  Take 1 tablet by mouth 4 (four) times daily.        Follow-up Information     Lanis Fonda BRAVO, MD Follow up in 1 month(s).   Specialty: Vascular Surgery Why: The office will call you with your appointment Contact information: [address] [place] [address] [phone]         [facility] Follow up in 1 week(s).   Contact information: [address] [place] [address] [phone]         Thukkani, Arun K, MD Follow up in 1 week(s).   Specialty: Cardiology Contact information: [address] [place] [address] [phone]                Allergies[1]  Consultations: Cardiology   Procedures/Studies: CARDIAC CATHETERIZATION Result Date: 06/30/2024 Images from the original result were not included. Cardiac Catheterization 06/30/2024: Hemodynamic data: EDP 10 mmHg.  No pressure gradient across aortic valve. Angiographic data: LM: Very large caliber vessel, distal left main has 10 to 20% stenosis. LCx: Very large caliber vessel giving origin to a large OM1, moderate to large AV groove circumflex and there is a 40 to 50% bifurcation stenosis in both branches.  Otherwise vessels are large and smooth. LAD: Large-caliber vessel, severely calcified in the proximal segment with irregular lumen and a 70% stenosis.  Gives origin to large D1 which is ulcerated with ostial and proximal tandem 80 to 95% stenosis.  Mid to distal LAD has mild disease. RCA: Dominant vessel and a large vessel and occluded in the proximal segment, CTO.  Contralateral collaterals from the LAD. Impression and recommendations: Complex LAD D1 bifurcation disease, suspect his marked EKG abnormality in the  high lateral leads is related to diagonal disease, due to complexity of the disease and his advanced age, would recommend continued medical therapy.  He did develop right forearm hematoma while trying to place TR band due to elevated blood pressure and large wrist, hence blood pressure cuff had to be applied.-Will be restarted 8 hours post TR band removal.   MR BRAIN WO CONTRAST Result Date: 06/29/2024 EXAM: MRI BRAIN WITHOUT CONTRAST 06/29/2024 10:38:00 PM TECHNIQUE: Multiplanar multisequence MRI of the head/brain was performed without the administration of intravenous contrast. COMPARISON: 05/24/2013 CLINICAL HISTORY: Headache, increasing frequency or severity. FINDINGS: BRAIN AND VENTRICLES: No acute infarct. No intracranial hemorrhage.  No mass. No midline shift. No hydrocephalus. Early confluent hyperintense T2-weighted signal within the cerebral white matter, most commonly due to chronic small vessel disease. The sella is unremarkable. Normal flow voids. ORBITS: No acute abnormality. SINUSES AND MASTOIDS: No acute abnormality. BONES AND SOFT TISSUES: Normal marrow signal. No acute soft tissue abnormality. IMPRESSION: 1. No acute intracranial abnormality. 2. Chronic small vessel ischemic change in the cerebral white matter. Electronically signed by: Franky Stanford MD 06/29/2024 11:06 PM EST RP Workstation: [id number]   CT ANGIO HEAD NECK W WO CM Result Date: 06/29/2024 EXAM: CT HEAD WITHOUT CTA HEAD AND NECK WITH AND WITHOUT 06/29/2024 01:32:38 PM TECHNIQUE: CTA of the head and neck was performed with and without the administration of 75 mL of iohexol  (OMNIPAQUE ) 350 MG/ML injection. Noncontrast CT of the head with reconstructed 2-D images are also provided for review. Multiplanar 2D and/or 3D reformatted images are provided for review. Automated exposure control, iterative reconstruction, and/or weight based adjustment of the mA/kV was utilized to reduce the radiation dose to as low as reasonably  achievable. COMPARISON: CT head 08/27/2022 and MRI head 05/24/2013 CLINICAL HISTORY: Vertigo, central FINDINGS: CT HEAD: BRAIN AND VENTRICLES: No acute intracranial hemorrhage. No mass effect. No midline shift. No extra-axial fluid collection. No evidence of acute infarct. No hydrocephalus. Similar hypoattenuation in the anterior aspect of the left basal ganglia suggestive of chronic microvascular ischemic changes and remote lacunar infarcts. Similar appearance of scattered hypoattenuation in the periventricular and subcortical white matter suggestive of moderate chronic microvascular ischemic changes. Mild parenchymal volume loss. Small remote cortical infarct in the left occipital lobe. ORBITS: Bilateral lens replacement. SINUSES AND MASTOIDS: Scattered mucosal thickening bilaterally in the maxillary and ethmoid sinuses. Hypoplastic left maxillary sinus. CTA NECK: AORTIC ARCH AND ARCH VESSELS: No dissection or arterial injury. Moderate atherosclerosis of the aortic arch. Atherosclerosis along the proximal right subclavian artery resulting in mild stenosis. No significant stenosis of the brachiocephalic artery. CERVICAL CAROTID ARTERIES: Prominent atherosclerosis at the right carotid bifurcation resulting in severe stenosis with radiologic string sign noted. There is at least 85 percent stenosis at the origin of the right cervical ICA. Atherosclerosis at the left carotid bifurcation and proximal left cervical ICA resulting in approximately 55% stenosis. No dissection or arterial injury. CERVICAL VERTEBRAL ARTERIES: The vertebral arteries are patent from the origins to the vertebrobasilar confluence. Mild tortuosity of the bilateral V1 segments. Atherosclerosis of the bilateral V4 segments resulting in mild stenosis. No dissection or arterial injury. LUNGS AND MEDIASTINUM: Centrilobular emphysema noted. There is an enlarged lymph node within the aortopulmonary window measuring up to 1.4 cm in short axis (series 11,  image 165). Recommend correlation with dedicated chest CT. SOFT TISSUES: Heterogeneous appearance of the thyroid  with multiple subcentimeter nodules noted. BONES: Lucent focus within the posterior right frontal calvarium corresponding to a region of signal abnormality on the prior MRI favored to reflect a hemangioma. Degenerative changes of the bilateral temporomandibular joints. Degenerative changes in the visualized spine with severe disc space narrowing at multiple levels in the lower cervical spine. Anterior cervical fusion hardware at C3-C4. CTA HEAD: ANTERIOR CIRCULATION: Extensive atherosclerosis of the bilateral carotid siphons. There is moderate stenosis of the bilateral cavernous ICAs. There is irregularity and mild stenosis of the M1 segment of the right MCA. The middle cerebral arteries are patent bilaterally. No significant stenosis of the anterior cerebral arteries. 2 mm anterior communicating artery aneurysm. POSTERIOR CIRCULATION: Mild to moderate stenosis of the P2 segment of the right PCA. Additional irregularity and  focal moderate stenosis of the P2 segment left PCA. No significant stenosis of the basilar artery. No significant stenosis of the vertebral arteries. No aneurysm. OTHER: No dural venous sinus thrombosis on this non-dedicated study. IMPRESSION: 1. No acute intracranial abnormality. 2. No acute large vessel occlusion. 3. Severe (at least 85%) stenosis at the origin of the right cervical ICA with radiologic string sign. Approximately 55% stenosis at the left carotid bifurcation/proximal left cervical ICA. 4. Moderate stenosis of the bilateral cavernous ICAs. 5. 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 6. Enlarged aortopulmonary window lymph node measuring up to 1.4 cm, for which dedicated chest CT is recommended. Electronically signed by: Donnice Mania MD 06/29/2024 03:28 PM EST RP Workstation: [id number]   DG Chest 2 View Result Date: 06/29/2024 EXAM: 2 VIEW(S) XRAY OF THE  CHEST 06/29/2024 12:33:00 PM COMPARISON: None available. CLINICAL HISTORY: shob FINDINGS: LUNGS AND PLEURA: Low lung volumes with bibasilar atelectasis. Right mid lung linear opacity. Coarsened interstitial markings without pulmonary edema. Blunting of bilateral costophrenic angles. No pneumothorax. HEART AND MEDIASTINUM: Cardiomegaly. Aortic calcification. BONES AND SOFT TISSUES: Cervical spine surgical hardware. No acute osseous abnormality. IMPRESSION: 1. Cardiomegaly without overt edema. 2. Blunting of bilateral costophrenic angles, possible trace bilateral effusions. 3. Low lung volumes with bibasilar atelectasis. Electronically signed by: Selinda Blue MD 06/29/2024 01:54 PM EST RP Workstation: [id number]    Subjective: Patient was seen and examined at bedside.  Overnight events noted. Patient reports feeling much improved,  hematuria has resolved and he wants to be discharged home.  Discharge Exam: Vitals:   07/03/24 0358 07/03/24 0750  BP: (!) 159/77 124/77  Pulse: 74 87  Resp: 20 20  Temp: 98.7 F (37.1 C) 98.1 F (36.7 C)  SpO2: 95% 96%   Vitals:   07/02/24 1800 07/02/24 2310 07/03/24 0358 07/03/24 0750  BP:  104/65 (!) 159/77 124/77  Pulse: 70 62 74 87  Resp: 19 11 20 20   Temp:  98.8 F (37.1 C) 98.7 F (37.1 C) 98.1 F (36.7 C)  TempSrc:  Axillary Oral Oral  SpO2: 96% 95% 95% 96%  Weight:      Height:        General: Pt is alert, awake, not in acute distress Cardiovascular: RRR, S1/S2 +, no rubs, no gallops Respiratory: CTA bilaterally, no wheezing, no rhonchi Abdominal: Soft, NT, ND, bowel sounds + Extremities: no edema, no cyanosis    The results of significant diagnostics from this hospitalization (including imaging, microbiology, ancillary and laboratory) are listed below for reference.     Microbiology: No results found for this or any previous visit (from the past 240 hours).   Labs: BNP (last 3 results) No results for input(s): BNP in the last 8760  hours. Basic Metabolic Panel: Recent Labs  Lab 06/29/24 1223 06/30/24 0701  NA 140 137  K 4.3 4.2  CL 101 100  CO2 30 28  GLUCOSE 83 98  BUN 34* 26*  CREATININE 1.02 0.91  CALCIUM  9.3 9.3   Liver Function Tests: Recent Labs  Lab 06/30/24 0701  AST 29  ALT 13  ALKPHOS 59  BILITOT 0.5  PROT 6.7  ALBUMIN 3.8   No results for input(s): LIPASE, AMYLASE in the last 168 hours. No results for input(s): AMMONIA in the last 168 hours. CBC: Recent Labs  Lab 06/29/24 1223 06/30/24 0701 07/01/24 0539 07/02/24 0303 07/02/24 2007 07/03/24 0401  WBC 8.2 5.9 6.7 8.3  --  11.3*  HGB 11.7* 12.3* 11.5* 11.8* 11.6* 11.4*  HCT  35.9* 37.0* 34.9* 34.9* 34.8* 34.7*  MCV 102.6* 100.3* 100.3* 98.9  --  101.8*  PLT 248 251 232 235  --  209   Cardiac Enzymes: No results for input(s): CKTOTAL, CKMB, CKMBINDEX, TROPONINI in the last 168 hours. BNP: Invalid input(s): POCBNP CBG: No results for input(s): GLUCAP in the last 168 hours. D-Dimer No results for input(s): DDIMER in the last 72 hours. Hgb A1c No results for input(s): HGBA1C in the last 72 hours. Lipid Profile No results for input(s): CHOL, HDL, LDLCALC, TRIG, CHOLHDL, LDLDIRECT in the last 72 hours. Thyroid  function studies No results for input(s): TSH, T4TOTAL, T3FREE, THYROIDAB in the last 72 hours.  Invalid input(s): FREET3 Anemia work up No results for input(s): VITAMINB12, FOLATE, FERRITIN, TIBC, IRON, RETICCTPCT in the last 72 hours.  Urinalysis    Component Value Date/Time   COLORURINE AMBER (A) 07/02/2024 1433   APPEARANCEUR TURBID (A) 07/02/2024 1433   LABSPEC 1.015 07/02/2024 1433   PHURINE 8.0 07/02/2024 1433   GLUCOSEU NEGATIVE 07/02/2024 1433   GLUCOSEU NEGATIVE 07/23/2016 1528   HGBUR LARGE (A) 07/02/2024 1433   BILIRUBINUR NEGATIVE 07/02/2024 1433   BILIRUBINUR Negative 09/10/2018 1043   KETONESUR NEGATIVE 07/02/2024 1433   PROTEINUR >300 (A)  07/02/2024 1433   UROBILINOGEN negative (A) 09/10/2018 1043   UROBILINOGEN 0.2 07/23/2016 1528   NITRITE POSITIVE (A) 07/02/2024 1433   LEUKOCYTESUR LARGE (A) 07/02/2024 1433   Sepsis Labs Recent Labs  Lab 06/30/24 0701 07/01/24 0539 07/02/24 0303 07/03/24 0401  WBC 5.9 6.7 8.3 11.3*   Microbiology No results found for this or any previous visit (from the past 240 hours).   Time coordinating discharge: Over 30 minutes  SIGNED:   Darcel Dawley, MD  [facility] 07/03/2024, 3:55 PM Pager   If 7PM-7AM, please contact night-coverage     [1] No Known Allergies

## 2024-07-03 ENCOUNTER — Other Ambulatory Visit (HOSPITAL_COMMUNITY): Payer: Self-pay

## 2024-07-03 DIAGNOSIS — I214 Non-ST elevation (NSTEMI) myocardial infarction: Secondary | ICD-10-CM | POA: Diagnosis not present

## 2024-07-03 LAB — CBC
HCT: 34.7 % — ABNORMAL LOW (ref 39.0–52.0)
Hemoglobin: 11.4 g/dL — ABNORMAL LOW (ref 13.0–17.0)
MCH: 33.4 pg (ref 26.0–34.0)
MCHC: 32.9 g/dL (ref 30.0–36.0)
MCV: 101.8 fL — ABNORMAL HIGH (ref 80.0–100.0)
Platelets: 209 K/uL (ref 150–400)
RBC: 3.41 MIL/uL — ABNORMAL LOW (ref 4.22–5.81)
RDW: 14.2 % (ref 11.5–15.5)
WBC: 11.3 K/uL — ABNORMAL HIGH (ref 4.0–10.5)
nRBC: 0 % (ref 0.0–0.2)

## 2024-07-03 LAB — HEPARIN LEVEL (UNFRACTIONATED): Heparin Unfractionated: <0.1 [IU]/mL — ABNORMAL LOW (ref 0.30–0.70)

## 2024-07-03 NOTE — Progress Notes (Signed)
 DISCHARGE NOTE HOME Brandon STAUBS to be discharged Home per MD order. Discussed prescriptions and follow up appointments with the patient. Prescriptions given to patient; medication list explained in detail. Patient verbalized understanding.Highlighted and marked AVS with Additional instructions..  Skin clean, dry and intact without evidence of skin break down, no evidence of skin tears noted. IV catheter discontinued intact. Site without signs and symptoms of complications. Dressing and pressure applied. Pt denies pain at the site currently. No complaints noted.  Patient free of lines, drains, and wounds.   An After Visit Summary (AVS) was printed and given to the patient. TOC meds in pharmacy on the way out Patient escorted via wheelchair, and discharged home via private auto.  Peyton SHAUNNA Pepper, RN

## 2024-07-03 NOTE — Progress Notes (Signed)
 3 Days Post-Op   Subjective/Chief Complaint:   1 - Recurrent Gross Hematuria from Prostatic Hypertrophy - pt of Dr. Nieves s/p TURP and laser procedures for BPH on finasteride  at baseline with gross hematuria after dual antiplatlet therapy for NSTEMI. He has years of off / on bleeding from his BPH. CT 2024 without stones / renal masses. Prostate vol 60gm at that time. UA without infectious parameters.  No retention or clots. Hgb 11.8 (trending up) and Cr 0.9. Received fimragon 1/2.      Today  [name] is stable. No overnight clot retention, voiding w/o catheter. Hgb remains quite good.     Objective: Vital signs in last 24 hours: Temp:  [98.2 F (36.8 C)-98.8 F (37.1 C)] 98.7 F (37.1 C) (01/03 0358) Pulse Rate:  [62-85] 74 (01/03 0358) Resp:  [11-27] 20 (01/03 0358) BP: (104-159)/(65-80) 159/77 (01/03 0358) SpO2:  [93 %-98 %] 95 % (01/03 0358) Last BM Date : 07/01/24  Intake/Output from previous day: 01/02 0701 - 01/03 0700 In: -  Out: 1625 [Urine:1625] Intake/Output this shift: Total I/O In: -  Out: 600 [Urine:600]  NAD, confused but orients NLB on NCO2 SNTND, NO CVAT No foley   Lab Results:  Recent Labs    07/02/24 0303 07/02/24 2007 07/03/24 0401  WBC 8.3  --  11.3*  HGB 11.8* 11.6* 11.4*  HCT 34.9* 34.8* 34.7*  PLT 235  --  209   BMET Recent Labs    06/30/24 0701  NA 137  K 4.2  CL 100  CO2 28  GLUCOSE 98  BUN 26*  CREATININE 0.91  CALCIUM  9.3   PT/INR No results for input(s): LABPROT, INR in the last 72 hours. ABG No results for input(s): PHART, HCO3 in the last 72 hours.  Invalid input(s): PCO2, PO2  Studies/Results: No results found.  Anti-infectives: Anti-infectives (From admission, onward)    None       Assessment/Plan:  I fell pt OK for DC home. Has Urol F/U. Continue finasteride . The firmagon  he received will also help decrease prostate source bleeding. Daughters and patient understand precautions for frank  retention.    Ricardo KATHEE Alvaro Mickey. 07/03/2024

## 2024-07-05 MED FILL — Hydralazine HCl Inj 20 MG/ML: INTRAMUSCULAR | Qty: 1 | Status: AC

## 2024-07-05 MED FILL — Verapamil HCl IV Soln 2.5 MG/ML: INTRAVENOUS | Qty: 2 | Status: AC

## 2024-07-05 NOTE — Progress Notes (Signed)
 Patient discharged, Important Message Letter mailed to patient.

## 2024-07-05 NOTE — Care Management Important Message (Signed)
 Important Message  Patient Details  Name: Brandon Mason MRN: [id number] Date of Birth: 29-Aug-1925   Important Message Given:  No     Jennie Laneta Dragon 07/05/2024, 9:47 AM

## 2024-07-07 ENCOUNTER — Other Ambulatory Visit: Payer: Self-pay

## 2024-07-07 DIAGNOSIS — I6529 Occlusion and stenosis of unspecified carotid artery: Secondary | ICD-10-CM

## 2024-07-20 NOTE — Progress Notes (Unsigned)
 " Cardiology Office Note:    Date:  07/21/2024   ID:  Brandon Mason, DOB May 13, 1926, MRN [id number]  PCP:  [facility]   [facility] Providers Cardiologist:  Jerel Balding, MD {  Referring MD: [facility]   Chief Complaint  Patient presents with   Follow-up    CAD s/p NSTEMI, HLD, HTN   History of Present Illness:    Brandon Mason is a 89 y.o. male with a hx of coronary artery disease s/p NSTEMI with LHC showing complex bifurcation disease treated medically in 07/2024 c/b RUE hematoma, hypertension, hyperlipidemia, type 2 diabetes mellitus, obstructive sleep apnea, remote history of ischemic stroke, moderate bilateral carotid artery stenosis, 75% right subclavian artery stenosis, remote history of DVT (no longer on anticoagulation), history of herniated cervical disc with myelopathy status post C3-C4 decompression, severe anemia (hemoglobin as low as 5.5 due to GI bleed from NSAID related esophageal ulcers) in 2023, OSA on CPAP, who presents to clinic today for hospital follow up.   Recently hospitalized at [facility] 06/29/2024 - 07/03/2024 in the setting of NSTEMI.  He had reported remote chest pain that was resolving without any intervention.  He was found to have elevated troponins, proBNP.  He was also found to have severe stenosis of the right ICA with string sign.  Cardiology and vascular surgery were consulted during this admission.  Patient underwent cardiac catheterization 06/30/2024 that showed: Complex LAD-D1 bifurcation disease, due to the complexity of his disease and his advanced age the recommendation was for medical therapy.  Post procedure, patient developed right upper extremity hematoma that resolved prior to discharge.  He was started on DAPT with aspirin , Plavix , along with Lipitor 80 mg daily, Lopressor  12.5 mg BID, hydralazine  50 mg BID, lisinopril  20 mg daily.  He was seen by vascular surgery during this admission.  Noting  that the patient had asymptomatic carotid artery stenosis, along with his advanced age, and pursue of medical management for CAD, it was determined he would be best served with continued medical management.  He was continued on the medications above.  Plans to see vascular surgery as an outpatient.  Most recent home medications include: Aspirin  81 mg daily, Plavix  75 mg daily, Lipitor 80 mg daily,PO Lasix  20 mg every other day, hydralazine  50 mg BID, lisinopril  20 mg daily, Lopressor  12.5 mg BID.   Patient presents to clinic today with his daughter. From a cardiac standpoint he says he is doing well. He has PT and OT coming to his home regularly to help him with mobility. He is typically on oxygen  at home (not presently on in clinic today with SpO2 95%). His daughter reports that they have recently been bumping him to 3L from 2L. He reports some mild fatigue, cough, congestion over the last 24 hours. He reports he is already feeling slightly better than he did the day before. He has an albuterol  inhaler at home, that he uses intermittently for wheezing/shortness of breath, he has not been using that more frequently but will start if he needs to as it usually relieves his symptoms.   Patients daughter reports that he may need some surgeries in the upcoming future, primarily elective surgeries/procedures including removal of SCC by dermatology and a retinal procedure. We discussed that the offices wanting to perform these procedures can reach out when they would choose to perform them and we would be able to provide guidance in regards to antiplatelet therapy and if safe to hold.  Past Medical History:  Diagnosis Date   Abnormality of gait 02/09/2013   Arthritis    Cancer (HCC)    basal cell skin cancer   Cardiomegaly    Chronic kidney disease    BPH   Chronic leg pain    Hypersomnia    Hypertension    Hypoxia    pulmonary   Neuromuscular disorder (HCC)    restless leg syndrome    Nocturia     Prostate disorder    Restless legs syndrome (RLS) 02/09/2013   Restless legs syndrome with nocturnal myoclonus    RLS (restless legs syndrome)    x 40 years   Shortness of breath    with exertion    Sleep apnea    uses cpap   Stroke (HCC)    minor stroke in 2003 - no lasting side effects   UARS (upper airway resistance syndrome) 06/28/2014    Past Surgical History:  Procedure Laterality Date   ANTERIOR CERVICAL DECOMP/DISCECTOMY FUSION N/A 03/29/2016   Procedure: ANTERIOR CERVICAL DECOMPRESSION/DISCECTOMY FUSION CERVICAL THREE- CERVICAL FOUR;  Surgeon: Rockey Peru, MD;  Location: [facility] NEURO ORS;  Service: Neurosurgery;  Laterality: N/A;   BACK SURGERY  2003   L4-5   CATARACT EXTRACTION Bilateral    ESOPHAGOGASTRODUODENOSCOPY (EGD) WITH PROPOFOL  N/A 03/17/2022   Procedure: ESOPHAGOGASTRODUODENOSCOPY (EGD) WITH PROPOFOL ;  Surgeon: Federico Rosario BROCKS, MD;  Location: [facility] ENDOSCOPY;  Service: Gastroenterology;  Laterality: N/A;   HERNIA REPAIR     inguinal hernia   LEFT HEART CATH AND CORONARY ANGIOGRAPHY N/A 06/30/2024   Procedure: LEFT HEART CATH AND CORONARY ANGIOGRAPHY;  Surgeon: Ladona Heinz, MD;  Location: [facility] INVASIVE CV LAB;  Service: Cardiovascular;  Laterality: N/A;   TRANSURETHRAL PROSTATECTOMY WITH GYRUS INSTRUMENTS  04/28/2012   Procedure: TRANSURETHRAL PROSTATECTOMY WITH GYRUS INSTRUMENTS;  Surgeon: Donnice Gwenyth Brooks, MD;  Location: [facility] ORS;  Service: Urology;  Laterality: N/A;   TRANSURETHRAL RESECTION OF PROSTATE  03/24/2012   Procedure: TRANSURETHRAL RESECTION OF THE PROSTATE (TURP);  Surgeon: Donnice Gwenyth Brooks, MD;  Location: [facility] ORS;  Service: Urology;  Laterality: N/A;  with gyrus ,Greenlight PVP laser of Prostate   Current Medications: Current Outpatient Medications  Medication Instructions   albuterol  (VENTOLIN  HFA) 108 (90 Base) MCG/ACT inhaler 2 puffs, Every 4 hours PRN   Aspirin  EC Adult Low Dose 81 mg, Oral, Daily, Swallow whole.   atorvastatin  (LIPITOR) 80 mg,  Oral, Daily   cholecalciferol (VITAMIN D3) 1,000 Units, 2 times daily   clopidogrel  (PLAVIX ) 75 mg, Oral, Daily   docusate sodium  (COLACE) 100 mg, Oral, 2 times daily   finasteride  (PROSCAR ) 5 MG tablet TAKE 1 TABLET BY MOUTH  DAILY   furosemide  (LASIX ) 20 mg, Every other day   hydrALAZINE  (APRESOLINE ) 50 mg, 2 times daily   levothyroxine  (SYNTHROID ) 25 MCG tablet 1 tablet, Daily   lisinopril  (ZESTRIL ) 10 MG tablet 2 tablets, Daily   metoprolol  tartrate (LOPRESSOR ) 12.5 mg, Oral, 2 times daily   Multiple Vitamins-Minerals (ONE A DAY MEN 50 PLUS PO) 1 tablet, Daily   naloxone (NARCAN) nasal spray 4 mg/0.1 mL    nystatin  cream (MYCOSTATIN ) 1 Application, 2 times daily   nystatin -triamcinolone  (MYCOLOG II) cream 1 Application, 2 times daily   OXYGEN  3 L, Continuous   pantoprazole  (PROTONIX ) 40 mg, Oral, Daily   polyethylene glycol (MIRALAX  / GLYCOLAX ) 17 g, Oral, Daily PRN   prednisoLONE acetate (PRED FORTE) 1 % ophthalmic suspension 1 drop, 2 times daily   pregabalin  (LYRICA ) 150 mg, Oral, 2  times daily   rOPINIRole  (REQUIP ) 1 mg, Daily at bedtime   rOPINIRole  (REQUIP ) 2 mg, 3 times daily   traMADol  (ULTRAM ) 50 MG tablet 1 tablet, 4 times daily   Allergies:   Patient has no known allergies.   Social History   Socioeconomic History   Marital status: Married    Spouse name: Brandon Mason   Number of children: 5   Years of education: 12   Highest education level: Not on file  Occupational History   Occupation: retired     Comment: Production Designer, Theatre/television/film for a museum/gallery curator  Tobacco Use   Smoking status: Never   Smokeless tobacco: Never  Substance and Sexual Activity   Alcohol use: Yes    Alcohol/week: 2.0 standard drinks of alcohol    Types: 2 Cans of beer per week    Comment: weekly   Drug use: No   Sexual activity: Not on file  Other Topics Concern   Not on file  Social History Narrative   Patient lives at home with his wife Brandon Mason.   Patient is retired.    Patient has 5 children.   Patient  has a high school education.   Patient is right-handed.   Patient drinks very little caffeine.   Social Drivers of Health   Tobacco Use: Low Risk (06/30/2024)   Patient History    Smoking Tobacco Use: Never    Smokeless Tobacco Use: Never    Passive Exposure: Not on file  Financial Resource Strain: Low Risk (04/13/2024)   Received from [facility]   Overall Financial Resource Strain (CARDIA)    How hard is it for you to pay for the very basics like food, housing, medical care, and heating?: Not hard at all  Food Insecurity: No Food Insecurity (06/30/2024)   Epic    Worried About Programme Researcher, Broadcasting/film/video in the Last Year: Never true    Ran Out of Food in the Last Year: Never true  Transportation Needs: No Transportation Needs (06/30/2024)   Epic    Lack of Transportation (Medical): No    Lack of Transportation (Non-Medical): No  Physical Activity: Inactive (04/13/2024)   Received [facility]   Exercise Vital Sign    On average, how many days per week do you engage in moderate to strenuous exercise (like a brisk walk)?: 0 days    Minutes of Exercise per Session: Not on file  Stress: No Stress Concern Present (04/13/2024)   Received from [facility] - Occupational Stress Questionnaire    Do you feel stress - tense, restless, nervous, or anxious, or unable to sleep at night because your mind is troubled all the time - these days?: Only a little  Social Connections: Moderately Integrated (06/30/2024)   Social Connection and Isolation Panel    Frequency of Communication with Friends and Family: Three times a week    Frequency of Social Gatherings with Friends and Family: Three times a week    Attends Religious Services: 1 to 4 times per year    Active Member of Clubs or Organizations: Yes    Attends Banker Meetings: More than 4 times per year    Marital Status: Widowed  Depression (PHQ2-9): Not on file  Alcohol Screen:  Not on file  Housing: Low Risk (06/30/2024)   Epic    Unable to Pay for Housing in the Last Year: No    Number of Times Moved in the Last Year: 0  Homeless in the Last Year: No  Utilities: Not At Risk (06/30/2024)   Epic    Threatened with loss of utilities: No  Health Literacy: Not on file     Family History: The patient's family history includes Restless legs syndrome in his daughter and mother. There is no history of Esophageal cancer, Colon cancer, Pancreatic cancer, or Stomach cancer.  ROS:   Please see the history of present illness.     All other systems reviewed and are negative.  EKGs/Labs/Other Studies Reviewed:    The following studies were reviewed today:      Recent Labs: 06/29/2024: Pro Brain Natriuretic Peptide 2,135.0 06/30/2024: ALT 13; BUN 26; Creatinine, Ser 0.91; Potassium 4.2; Sodium 137 07/03/2024: Hemoglobin 11.4; Platelets 209  Recent Lipid Panel    Component Value Date/Time   CHOL 166 05/08/2022 1009   TRIG 49 05/08/2022 1009   HDL 72 05/08/2022 1009   CHOLHDL 2.3 05/08/2022 1009   CHOLHDL 3.0 03/17/2022 0731   VLDL 18 03/17/2022 0731   LDLCALC 84 05/08/2022 1009     Risk Assessment/Calculations:             Physical Exam:    VS:  BP 124/62   Pulse 64   Ht 5' 8 (1.727 m)   Wt 186 lb (84.4 kg)   SpO2 95%   BMI 28.28 kg/m     Wt Readings from Last 3 Encounters:  07/21/24 186 lb (84.4 kg)  06/29/24 180 lb (81.6 kg)  08/26/23 181 lb 9.6 oz (82.4 kg)    GEN:  elderly male, on room air, in no acute distress HEENT: Normal NECK: No JVD; No carotid bruits LYMPHATICS: No lymphadenopathy CARDIAC: RRR, no murmurs, rubs, gallops RESPIRATORY:  Clear to auscultation without rales, wheezing or rhonchi  ABDOMEN: Soft, non-tender, non-distended MUSCULOSKELETAL:  soft bilateral edema, reports chronic, in compression stockings   SKIN: Warm and dry NEUROLOGIC:  Alert and oriented x 3 PSYCHIATRIC:  Normal affect   ASSESSMENT:    1.  Coronary artery disease involving native coronary artery of native heart without angina pectoris   2. Hyperlipidemia, unspecified hyperlipidemia type   3. Essential hypertension   4. Occlusion of right internal carotid artery    PLAN:    CAD s/p recent NSTEMI with complex disease treated medically Hyperlipidemia  Home meds: Aspirin  81 mg daily, Plavix  75 mg daily, Lipitor 80 mg daily, lisinopril  20 mg daily, Lopressor  12.5 mg BID Denies any recent chest pain, reports some chronic shortness of breath relieved by his albuterol  inhaler  Denies any signs of bleeding  Lipitor was increased during last admission, will update FLP at next appointment  Continue Aspirin  81 mg daily Continue Plavix  75 mg daily Continue Lipitor 80 mg daily Continue lisinopril  20 mg daily Continue Lopressor  12.5 mg BID  RUE hematoma post LHC Noted to have RUE hematoma post cath 05/2024  Right radial site looks great today Patient denies any pain   Hypertension  Home meds: PO Lasix  20 mg every other day, hydralazine  50 mg BID, lisinopril  20 mg daily, Lopressor  12.5 mg BID BP today 124/62 Patient reports unreliable BP at home, cuff may not be accurate PT/OT have been visiting house and report SBP 100-120s Continue PO Lasix  20 mg every other day Continue hydralazine  50 mg BID Continue lisinopril  20 mg daily Continue Lopressor  12.5 mg BID Discussed updating labs today, patient reports he has upcoming appt where labs will be drawn and defers today  Carotid artery disease, severe, right  Seen by vascular inpatient Conservative management pursued Will continue to see vascular as an outpatient  Upcoming carotid ultrasound scheduled   Follow up: 3-4 months with Dr. Francyne or any APP per patient request      Medication Adjustments/Labs and Tests Ordered: Current medicines are reviewed at length with the patient today.  Concerns regarding medicines are outlined above.  No orders of the defined types were  placed in this encounter.  Meds ordered this encounter  Medications   atorvastatin  (LIPITOR) 80 MG tablet    Sig: Take 1 tablet (80 mg total) by mouth daily.    Dispense:  90 tablet    Refill:  3   clopidogrel  (PLAVIX ) 75 MG tablet    Sig: Take 1 tablet (75 mg total) by mouth daily.    Dispense:  90 tablet    Refill:  3   metoprolol  tartrate (LOPRESSOR ) 25 MG tablet    Sig: Take 0.5 tablets (12.5 mg total) by mouth 2 (two) times daily.    Dispense:  90 tablet    Refill:  3   pantoprazole  (PROTONIX ) 40 MG tablet    Sig: Take 1 tablet (40 mg total) by mouth daily at 6 (six) AM.    Dispense:  30 tablet    Refill:  11   Patient Instructions  Medication Instructions:   Your physician recommends that you continue on your current medications as directed. Please refer to the Current Medication list given to you today.   *If you need a refill on your cardiac medications before your next appointment, please call your pharmacy*  Lab Work: NONE ORDERED  TODAY   If you have labs (blood work) drawn today and your tests are completely normal, you will receive your results only by: MyChart Message (if you have MyChart) OR A paper copy in the mail If you have any lab test that is abnormal or we need to change your treatment, we will call you to review the results.  Testing/Procedures: NONE ORDERED  TODAY    Follow-Up: At [facility], you and your health needs are our priority.  As part of our continuing mission to provide you with exceptional heart care, our providers are all part of one team.  This team includes your primary Cardiologist (physician) and Advanced Practice Providers or APPs (Physician Assistants and Nurse Practitioners) who all work together to provide you with the care you need, when you need it.  Your next appointment:  3 -4 month(s)    Provider:  Jerel Francyne, MD/ APP    We recommend signing up for the patient portal called MyChart.  Sign up information  is provided on this After Visit Summary.  MyChart is used to connect with patients for Virtual Visits (Telemedicine).  Patients are able to view lab/test results, encounter notes, upcoming appointments, etc.  Non-urgent messages can be sent to your provider as well.   To learn more about what you can do with MyChart, go to [email or web address].   Other Instructions             Signed, Waddell DELENA Donath, [name]  07/21/2024 3:54 PM    [facility]  "

## 2024-07-21 ENCOUNTER — Encounter: Payer: Self-pay | Admitting: Cardiology

## 2024-07-21 ENCOUNTER — Ambulatory Visit: Attending: Cardiology | Admitting: Cardiology

## 2024-07-21 VITALS — BP 124/62 | HR 64 | Ht 68.0 in | Wt 186.0 lb

## 2024-07-21 DIAGNOSIS — I2510 Atherosclerotic heart disease of native coronary artery without angina pectoris: Secondary | ICD-10-CM | POA: Diagnosis not present

## 2024-07-21 DIAGNOSIS — I6521 Occlusion and stenosis of right carotid artery: Secondary | ICD-10-CM

## 2024-07-21 DIAGNOSIS — I10 Essential (primary) hypertension: Secondary | ICD-10-CM

## 2024-07-21 DIAGNOSIS — E785 Hyperlipidemia, unspecified: Secondary | ICD-10-CM | POA: Diagnosis not present

## 2024-07-21 MED ORDER — METOPROLOL TARTRATE 25 MG PO TABS: 12.5000 mg | ORAL_TABLET | Freq: Two times a day (BID) | ORAL | 3 refills | Status: DC

## 2024-07-21 MED ORDER — ATORVASTATIN CALCIUM 80 MG PO TABS: 80.0000 mg | ORAL_TABLET | Freq: Every day | ORAL | 3 refills | Status: AC

## 2024-07-21 MED ORDER — PANTOPRAZOLE SODIUM 40 MG PO TBEC: 40.0000 mg | DELAYED_RELEASE_TABLET | Freq: Every day | ORAL | 11 refills | Status: AC

## 2024-07-21 MED ORDER — CLOPIDOGREL BISULFATE 75 MG PO TABS: 75.0000 mg | ORAL_TABLET | Freq: Every day | ORAL | 3 refills | Status: AC

## 2024-07-21 NOTE — Patient Instructions (Signed)
 Medication Instructions:   Your physician recommends that you continue on your current medications as directed. Please refer to the Current Medication list given to you today.   *If you need a refill on your cardiac medications before your next appointment, please call your pharmacy*  Lab Work: NONE ORDERED  TODAY   If you have labs (blood work) drawn today and your tests are completely normal, you will receive your results only by: MyChart Message (if you have MyChart) OR A paper copy in the mail If you have any lab test that is abnormal or we need to change your treatment, we will call you to review the results.  Testing/Procedures: NONE ORDERED  TODAY    Follow-Up: At [facility], you and your health needs are our priority.  As part of our continuing mission to provide you with exceptional heart care, our providers are all part of one team.  This team includes your primary Cardiologist (physician) and Advanced Practice Providers or APPs (Physician Assistants and Nurse Practitioners) who all work together to provide you with the care you need, when you need it.  Your next appointment:  3 -4 month(s)    Provider:  Jerel Balding, MD/ APP    We recommend signing up for the patient portal called MyChart.  Sign up information is provided on this After Visit Summary.  MyChart is used to connect with patients for Virtual Visits (Telemedicine).  Patients are able to view lab/test results, encounter notes, upcoming appointments, etc.  Non-urgent messages can be sent to your provider as well.   To learn more about what you can do with MyChart, go to [email or web address].   Other Instructions

## 2024-07-28 ENCOUNTER — Emergency Department (HOSPITAL_COMMUNITY)

## 2024-07-28 ENCOUNTER — Inpatient Hospital Stay (HOSPITAL_COMMUNITY)
Admission: EM | Admit: 2024-07-28 | Discharge: 2024-08-01 | DRG: 313 | Disposition: A | Discharge status: Skilled Nursing Facility | Source: Skilled Nursing Facility | Attending: Internal Medicine | Admitting: Internal Medicine

## 2024-07-28 ENCOUNTER — Other Ambulatory Visit: Payer: Self-pay

## 2024-07-28 DIAGNOSIS — Z85828 Personal history of other malignant neoplasm of skin: Secondary | ICD-10-CM

## 2024-07-28 DIAGNOSIS — I5023 Acute on chronic systolic (congestive) heart failure: Secondary | ICD-10-CM

## 2024-07-28 DIAGNOSIS — I6522 Occlusion and stenosis of left carotid artery: Secondary | ICD-10-CM | POA: Diagnosis present

## 2024-07-28 DIAGNOSIS — Z9079 Acquired absence of other genital organ(s): Secondary | ICD-10-CM

## 2024-07-28 DIAGNOSIS — E785 Hyperlipidemia, unspecified: Secondary | ICD-10-CM | POA: Diagnosis present

## 2024-07-28 DIAGNOSIS — K219 Gastro-esophageal reflux disease without esophagitis: Secondary | ICD-10-CM | POA: Diagnosis present

## 2024-07-28 DIAGNOSIS — I110 Hypertensive heart disease with heart failure: Secondary | ICD-10-CM | POA: Diagnosis present

## 2024-07-28 DIAGNOSIS — Z515 Encounter for palliative care: Secondary | ICD-10-CM

## 2024-07-28 DIAGNOSIS — Z9841 Cataract extraction status, right eye: Secondary | ICD-10-CM

## 2024-07-28 DIAGNOSIS — I350 Nonrheumatic aortic (valve) stenosis: Secondary | ICD-10-CM | POA: Diagnosis present

## 2024-07-28 DIAGNOSIS — I10 Essential (primary) hypertension: Secondary | ICD-10-CM | POA: Diagnosis present

## 2024-07-28 DIAGNOSIS — R059 Cough, unspecified: Secondary | ICD-10-CM | POA: Insufficient documentation

## 2024-07-28 DIAGNOSIS — R053 Chronic cough: Secondary | ICD-10-CM | POA: Diagnosis present

## 2024-07-28 DIAGNOSIS — I214 Non-ST elevation (NSTEMI) myocardial infarction: Secondary | ICD-10-CM | POA: Diagnosis present

## 2024-07-28 DIAGNOSIS — Z86718 Personal history of other venous thrombosis and embolism: Secondary | ICD-10-CM

## 2024-07-28 DIAGNOSIS — I2510 Atherosclerotic heart disease of native coronary artery without angina pectoris: Secondary | ICD-10-CM | POA: Diagnosis present

## 2024-07-28 DIAGNOSIS — N400 Enlarged prostate without lower urinary tract symptoms: Secondary | ICD-10-CM | POA: Diagnosis present

## 2024-07-28 DIAGNOSIS — E876 Hypokalemia: Secondary | ICD-10-CM | POA: Diagnosis present

## 2024-07-28 DIAGNOSIS — J9611 Chronic respiratory failure with hypoxia: Secondary | ICD-10-CM | POA: Diagnosis present

## 2024-07-28 DIAGNOSIS — Z9981 Dependence on supplemental oxygen: Secondary | ICD-10-CM

## 2024-07-28 DIAGNOSIS — R079 Chest pain, unspecified: Principal | ICD-10-CM

## 2024-07-28 DIAGNOSIS — Z9842 Cataract extraction status, left eye: Secondary | ICD-10-CM

## 2024-07-28 DIAGNOSIS — E039 Hypothyroidism, unspecified: Secondary | ICD-10-CM | POA: Diagnosis not present

## 2024-07-28 DIAGNOSIS — G4733 Obstructive sleep apnea (adult) (pediatric): Secondary | ICD-10-CM | POA: Diagnosis present

## 2024-07-28 DIAGNOSIS — R0602 Shortness of breath: Secondary | ICD-10-CM

## 2024-07-28 DIAGNOSIS — R051 Acute cough: Secondary | ICD-10-CM

## 2024-07-28 DIAGNOSIS — Z79890 Hormone replacement therapy (postmenopausal): Secondary | ICD-10-CM

## 2024-07-28 DIAGNOSIS — I252 Old myocardial infarction: Secondary | ICD-10-CM

## 2024-07-28 DIAGNOSIS — I5042 Chronic combined systolic (congestive) and diastolic (congestive) heart failure: Secondary | ICD-10-CM | POA: Diagnosis present

## 2024-07-28 DIAGNOSIS — Z981 Arthrodesis status: Secondary | ICD-10-CM

## 2024-07-28 DIAGNOSIS — I4510 Unspecified right bundle-branch block: Secondary | ICD-10-CM | POA: Diagnosis present

## 2024-07-28 DIAGNOSIS — D539 Nutritional anemia, unspecified: Secondary | ICD-10-CM | POA: Diagnosis present

## 2024-07-28 DIAGNOSIS — T502X5A Adverse effect of carbonic-anhydrase inhibitors, benzothiadiazides and other diuretics, initial encounter: Secondary | ICD-10-CM | POA: Diagnosis present

## 2024-07-28 DIAGNOSIS — G2581 Restless legs syndrome: Secondary | ICD-10-CM | POA: Diagnosis present

## 2024-07-28 DIAGNOSIS — Z7982 Long term (current) use of aspirin: Secondary | ICD-10-CM

## 2024-07-28 DIAGNOSIS — Z66 Do not resuscitate: Secondary | ICD-10-CM | POA: Diagnosis present

## 2024-07-28 DIAGNOSIS — Z1152 Encounter for screening for COVID-19: Secondary | ICD-10-CM

## 2024-07-28 DIAGNOSIS — Z8673 Personal history of transient ischemic attack (TIA), and cerebral infarction without residual deficits: Secondary | ICD-10-CM

## 2024-07-28 DIAGNOSIS — E872 Acidosis, unspecified: Secondary | ICD-10-CM | POA: Diagnosis present

## 2024-07-28 DIAGNOSIS — E1140 Type 2 diabetes mellitus with diabetic neuropathy, unspecified: Secondary | ICD-10-CM | POA: Diagnosis present

## 2024-07-28 DIAGNOSIS — D509 Iron deficiency anemia, unspecified: Secondary | ICD-10-CM | POA: Diagnosis present

## 2024-07-28 DIAGNOSIS — R0789 Other chest pain: Principal | ICD-10-CM | POA: Diagnosis present

## 2024-07-28 DIAGNOSIS — N179 Acute kidney failure, unspecified: Secondary | ICD-10-CM | POA: Diagnosis present

## 2024-07-28 DIAGNOSIS — Z79899 Other long term (current) drug therapy: Secondary | ICD-10-CM

## 2024-07-28 DIAGNOSIS — Z7902 Long term (current) use of antithrombotics/antiplatelets: Secondary | ICD-10-CM

## 2024-07-28 DIAGNOSIS — R7989 Other specified abnormal findings of blood chemistry: Secondary | ICD-10-CM | POA: Diagnosis present

## 2024-07-28 DIAGNOSIS — R6883 Chills (without fever): Secondary | ICD-10-CM | POA: Diagnosis present

## 2024-07-28 LAB — COMPREHENSIVE METABOLIC PANEL WITH GFR
ALT: 11 U/L (ref 0–44)
AST: 23 U/L (ref 15–41)
Albumin: 3.9 g/dL (ref 3.5–5.0)
Alkaline Phosphatase: 60 U/L (ref 38–126)
Anion gap: 15 (ref 5–15)
BUN: 26 mg/dL — ABNORMAL HIGH (ref 8–23)
CO2: 24 mmol/L (ref 22–32)
Calcium: 9.1 mg/dL (ref 8.9–10.3)
Chloride: 102 mmol/L (ref 98–111)
Creatinine, Ser: 1.05 mg/dL (ref 0.61–1.24)
GFR, Estimated: >60 mL/min
Glucose, Bld: 118 mg/dL — ABNORMAL HIGH (ref 70–99)
Potassium: 3.3 mmol/L — ABNORMAL LOW (ref 3.5–5.1)
Sodium: 141 mmol/L (ref 135–145)
Total Bilirubin: 0.8 mg/dL (ref 0.0–1.2)
Total Protein: 7.2 g/dL (ref 6.5–8.1)

## 2024-07-28 LAB — LIPID PANEL
Cholesterol: 105 mg/dL (ref 0–200)
HDL: 51 mg/dL
LDL Cholesterol: 40 mg/dL (ref 0–99)
Total CHOL/HDL Ratio: 2.1 ratio
Triglycerides: 68 mg/dL
VLDL: 14 mg/dL (ref 0–40)

## 2024-07-28 LAB — RESP PANEL BY RT-PCR (RSV, FLU A&B, COVID)  RVPGX2
Influenza A by PCR: NEGATIVE
Influenza B by PCR: NEGATIVE
Resp Syncytial Virus by PCR: NEGATIVE
SARS Coronavirus 2 by RT PCR: NEGATIVE

## 2024-07-28 LAB — CBC WITH DIFFERENTIAL/PLATELET
Abs Immature Granulocytes: 0.04 10*3/uL (ref 0.00–0.07)
Basophils Absolute: 0.1 10*3/uL (ref 0.0–0.1)
Basophils Relative: 1 %
Eosinophils Absolute: 0.1 10*3/uL (ref 0.0–0.5)
Eosinophils Relative: 1 %
HCT: 35.9 % — ABNORMAL LOW (ref 39.0–52.0)
Hemoglobin: 11.8 g/dL — ABNORMAL LOW (ref 13.0–17.0)
Immature Granulocytes: 1 %
Lymphocytes Relative: 28 %
Lymphs Abs: 2.1 10*3/uL (ref 0.7–4.0)
MCH: 33.2 pg (ref 26.0–34.0)
MCHC: 32.9 g/dL (ref 30.0–36.0)
MCV: 101.1 fL — ABNORMAL HIGH (ref 80.0–100.0)
Monocytes Absolute: 0.9 10*3/uL (ref 0.1–1.0)
Monocytes Relative: 12 %
Neutro Abs: 4.3 10*3/uL (ref 1.7–7.7)
Neutrophils Relative %: 57 %
Platelets: 228 10*3/uL (ref 150–400)
RBC: 3.55 MIL/uL — ABNORMAL LOW (ref 4.22–5.81)
RDW: 13.7 % (ref 11.5–15.5)
WBC: 7.5 10*3/uL (ref 4.0–10.5)
nRBC: 0 % (ref 0.0–0.2)

## 2024-07-28 LAB — LIPASE, BLOOD: Lipase: 17 U/L (ref 11–51)

## 2024-07-28 LAB — PROTIME-INR
INR: 1 (ref 0.8–1.2)
Prothrombin Time: 14.1 s (ref 11.4–15.2)

## 2024-07-28 LAB — APTT: aPTT: 183 s (ref 24–36)

## 2024-07-28 LAB — I-STAT CG4 LACTIC ACID, ED: Lactic Acid, Venous: 2.4 mmol/L (ref 0.5–1.9)

## 2024-07-28 LAB — TROPONIN T, HIGH SENSITIVITY: Troponin T High Sensitivity: 45 ng/L — ABNORMAL HIGH (ref 0–19)

## 2024-07-28 LAB — PRO BRAIN NATRIURETIC PEPTIDE: Pro Brain Natriuretic Peptide: 1226 pg/mL — ABNORMAL HIGH

## 2024-07-28 MED ORDER — HEPARIN SODIUM (PORCINE) 5000 UNIT/ML IJ SOLN
4000.0000 [IU] | Freq: Once | INTRAMUSCULAR | Status: AC
  Administered 2024-07-28: 4000 [IU] via INTRAVENOUS
  Filled 2024-07-28: qty 1

## 2024-07-28 MED ORDER — SODIUM CHLORIDE 0.9 % IV SOLN: INTRAVENOUS | Status: AC

## 2024-07-28 MED ORDER — FENTANYL CITRATE (PF) 50 MCG/ML IJ SOSY
50.0000 ug | PREFILLED_SYRINGE | Freq: Once | INTRAMUSCULAR | Status: AC
  Administered 2024-07-28: 50 ug via INTRAVENOUS
  Filled 2024-07-28: qty 1

## 2024-07-28 MED ORDER — ASPIRIN 81 MG PO CHEW
324.0000 mg | CHEWABLE_TABLET | Freq: Once | ORAL | Status: AC
  Administered 2024-07-28: 324 mg via ORAL
  Filled 2024-07-28: qty 4

## 2024-07-28 NOTE — ED Notes (Signed)
 Code Stemi activated per Dr. Tegeler

## 2024-07-28 NOTE — H&P (Signed)
 " History and Physical    Brandon Mason FMW:[id number] DOB: 30-May-1926 DOA: 07/28/2024  Patient coming from: Home.  Chief Complaint: Chest pain.  HPI: Brandon Mason is a 89 y.o. male with history of recent admission for non-ST elevation MI treated conservatively, hypertension, chronic respiratory failure, sleep apnea, restless leg syndrome, hypothyroidism, chronic anemia, history of GI bleed was brought to the ER after patient started having sudden onset of retrosternal chest pain with shortness of breath cough and one episode of nausea vomiting.  Patient states last few days he has been having cough and was not feeling well.  Chest pain was retrosternal nonradiating.  ED Course: In the ER patient's EKG was concerning for possible ST elevation MI cardiology was consulted.  Per cardiology patient did not meet criteria for ST elevation MI and heparin  infusion was started admitted for non-ST-elevation MI.  Labs show proBNP of 1200 troponin of 45 and 44.  Hemoglobin 11.8 macrocytic.  Potassium 3.3 replaced was given.  Creatinine 1.  Review of Systems: As per HPI, rest all negative.   Past Medical History:  Diagnosis Date   Abnormality of gait 02/09/2013   Arthritis    Cancer (HCC)    basal cell skin cancer   Cardiomegaly    Chronic kidney disease    BPH   Chronic leg pain    Hypersomnia    Hypertension    Hypoxia    pulmonary   Neuromuscular disorder (HCC)    restless leg syndrome    Nocturia    Prostate disorder    Restless legs syndrome (RLS) 02/09/2013   Restless legs syndrome with nocturnal myoclonus    RLS (restless legs syndrome)    x 40 years   Shortness of breath    with exertion    Sleep apnea    uses cpap   Stroke (HCC)    minor stroke in 2003 - no lasting side effects   UARS (upper airway resistance syndrome) 06/28/2014    Past Surgical History:  Procedure Laterality Date   ANTERIOR CERVICAL DECOMP/DISCECTOMY FUSION N/A 03/29/2016   Procedure: ANTERIOR CERVICAL  DECOMPRESSION/DISCECTOMY FUSION CERVICAL THREE- CERVICAL FOUR;  Surgeon: Rockey Peru, MD;  Location: [facility];  Service: Neurosurgery;  Laterality: N/A;   BACK SURGERY  2003   L4-5   CATARACT EXTRACTION Bilateral    ESOPHAGOGASTRODUODENOSCOPY (EGD) WITH PROPOFOL  N/A 03/17/2022   Procedure: ESOPHAGOGASTRODUODENOSCOPY (EGD) WITH PROPOFOL ;  Surgeon: Federico Rosario BROCKS, MD;  Location: [facility] ENDOSCOPY;  Service: Gastroenterology;  Laterality: N/A;   HERNIA REPAIR     inguinal hernia   LEFT HEART CATH AND CORONARY ANGIOGRAPHY N/A 06/30/2024   Procedure: LEFT HEART CATH AND CORONARY ANGIOGRAPHY;  Surgeon: Ladona Heinz, MD;  Location: [facility] INVASIVE CV LAB;  Service: Cardiovascular;  Laterality: N/A;   TRANSURETHRAL PROSTATECTOMY WITH GYRUS INSTRUMENTS  04/28/2012   Procedure: TRANSURETHRAL PROSTATECTOMY WITH GYRUS INSTRUMENTS;  Surgeon: Donnice Gwenyth Brooks, MD;  Location: [facility] ORS;  Service: Urology;  Laterality: N/A;   TRANSURETHRAL RESECTION OF PROSTATE  03/24/2012   Procedure: TRANSURETHRAL RESECTION OF THE PROSTATE (TURP);  Surgeon: Donnice Gwenyth Brooks, MD;  Location: [facility] ORS;  Service: Urology;  Laterality: N/A;  with gyrus ,Greenlight PVP laser of Prostate     reports that he has never smoked. He has never used smokeless tobacco. He reports current alcohol use of about 2.0 standard drinks of alcohol per week. He reports that he does not use drugs.  Allergies[1]  Family History  Problem Relation Age of Onset  Restless legs syndrome Mother    Restless legs syndrome Daughter    Esophageal cancer Neg Hx    Colon cancer Neg Hx    Pancreatic cancer Neg Hx    Stomach cancer Neg Hx     Prior to Admission medications  Medication Sig Start Date End Date Taking? Authorizing Provider  albuterol  (VENTOLIN  HFA) 108 (90 Base) MCG/ACT inhaler Inhale 2 puffs into the lungs every 4 (four) hours as needed. 05/21/24  Yes [provider]  aspirin  EC 81 MG tablet Take 1 tablet (81 mg total) by mouth  daily. Swallow whole. 07/03/24  Yes Leotis Bogus, MD  atorvastatin  (LIPITOR) 80 MG tablet Take 1 tablet (80 mg total) by mouth daily. 07/21/24  Yes Parcells, Waddell LABOR, [name]  cholecalciferol (VITAMIN D3) 25 MCG (1000 UNIT) tablet Take 1,000 Units by mouth 2 (two) times daily.   Yes [provider]  clopidogrel  (PLAVIX ) 75 MG tablet Take 1 tablet (75 mg total) by mouth daily. 07/21/24  Yes Parcells, Waddell LABOR, [name]  finasteride  (PROSCAR ) 5 MG tablet TAKE 1 TABLET BY MOUTH  DAILY Patient taking differently: Take 5 mg by mouth daily. 05/03/21  Yes Saguier, [place], [name]  furosemide  (LASIX ) 20 MG tablet Take 20 mg by mouth every other day. Take 1 Tablet Every other Day.   Yes [provider]  hydrALAZINE  (APRESOLINE ) 50 MG tablet Take 50 mg by mouth 2 (two) times daily. 05/14/24  Yes [provider]  levothyroxine  (SYNTHROID ) 25 MCG tablet Take 1 tablet by mouth daily.   Yes [provider]  lisinopril  (ZESTRIL ) 10 MG tablet Take 2 tablets by mouth daily.   Yes [provider]  metoprolol  tartrate (LOPRESSOR ) 25 MG tablet Take 0.5 tablets (12.5 mg total) by mouth 2 (two) times daily. 07/21/24 08/20/24 Yes Parcells, Waddell LABOR, [name]  Multiple Vitamins-Minerals (ONE A DAY MEN 50 PLUS PO) Take 1 tablet by mouth daily at 12 noon.   Yes [provider]  nystatin -triamcinolone  (MYCOLOG II) cream Apply 1 Application topically 2 (two) times daily. Apply a thin layer topically to lower back and/or groin area. 09/29/23  Yes [provider]  OXYGEN  Inhale 2.5 L into the lungs continuous.   Yes [provider]  pantoprazole  (PROTONIX ) 40 MG tablet Take 1 tablet (40 mg total) by mouth daily at 6 (six) AM. 07/21/24 07/21/25 Yes Parcells, Waddell LABOR, [name]  polyethylene glycol (MIRALAX  / GLYCOLAX ) 17 g packet Take 17 g by mouth daily as needed. 09/02/22  Yes Vicci Sor R, [name]  pregabalin  (LYRICA ) 150 MG capsule Take 1 capsule (150 mg total) by mouth 2 (two)  times daily. 09/09/22  Yes [name], [name], [name]  rOPINIRole  (REQUIP ) 1 MG tablet Take 1 mg by mouth at bedtime. Take one tablet (1mg ) with one tablet (2mg ) by mouth at bedtime for a combined dose of 3mg  at bedtime.   Yes [provider]  rOPINIRole  (REQUIP ) 2 MG tablet Take 2 mg by mouth 3 (three) times daily. Take one tablet (2mg ) by mouth daily at noon and 1700.  Take one tablet (2mg ) with one tablet (1mg ) by mouth at bedtime for a combined dose of 3mg  at bedtime.   Yes [provider]  traMADol  (ULTRAM ) 50 MG tablet Take 1 tablet by mouth 4 (four) times daily. 09/24/22  Yes [provider]  docusate sodium  (COLACE) 100 MG capsule Take 1 capsule (100 mg total) by mouth 2 (two) times daily. Patient not taking: Reported on 07/28/2024 09/02/22  Vicci Sor R, [name]  naloxone [facility]) nasal spray 4 mg/0.1 mL     [provider]  nystatin  cream (MYCOSTATIN ) Apply 1 Application topically 2 (two) times daily. Apply a thin layer topically twice per day to groin area Patient not taking: Reported on 07/28/2024    [provider]  prednisoLONE acetate (PRED FORTE) 1 % ophthalmic suspension Place 1 drop into the right eye 2 (two) times daily. Patient not taking: Reported on 07/28/2024 06/02/24   [provider]    Physical Exam: Constitutional: Moderately built and nourished. Vitals:   07/28/24 2109 07/28/24 2115 07/28/24 2122 07/28/24 2130  BP:  (!) 132/94  (!) 149/78  Pulse: 95 96  92  Resp: (!) 22 (!) 29  (!) 28  Temp:      SpO2: 97% 97%  98%  Weight:   81.6 kg   Height:   5' 8 (1.727 m)    Eyes: Anicteric no pallor. ENMT: No discharge from the ears eyes nose or mouth. Neck: No mass felt.  No neck rigidity. Respiratory: No rhonchi or crepitations. Cardiovascular: S1 S2 heard. Abdomen: Soft nontender bowel sound present. Musculoskeletal: No edema. Skin: No rash. Neurologic: Alert awake oriented time place and person.  Moves all  extremities. Psychiatric: Appears normal.  Normal affect.   Labs on Admission: I have personally reviewed following labs and imaging studies  CBC: Recent Labs  Lab 07/28/24 2117  WBC 7.5  NEUTROABS 4.3  HGB 11.8*  HCT 35.9*  MCV 101.1*  PLT 228   Basic Metabolic Panel: Recent Labs  Lab 07/28/24 2117  NA 141  K 3.3*  CL 102  CO2 24  GLUCOSE 118*  BUN 26*  CREATININE 1.05  CALCIUM  9.1   GFR: Estimated Creatinine Clearance: 39.8 mL/min (by C-G formula based on SCr of 1.05 mg/dL). Liver Function Tests: Recent Labs  Lab 07/28/24 2117  AST 23  ALT 11  ALKPHOS 60  BILITOT 0.8  PROT 7.2  ALBUMIN 3.9   Recent Labs  Lab 07/28/24 2117  LIPASE 17   No results for input(s): AMMONIA in the last 168 hours. Coagulation Profile: Recent Labs  Lab 07/28/24 2117  INR 1.0   Cardiac Enzymes: No results for input(s): CKTOTAL, CKMB, CKMBINDEX, TROPONINI in the last 168 hours. BNP (last 3 results) Recent Labs    06/29/24 2102 07/28/24 2117  PROBNP 2,135.0* 1,226.0*   HbA1C: No results for input(s): HGBA1C in the last 72 hours. CBG: No results for input(s): GLUCAP in the last 168 hours. Lipid Profile: Recent Labs    07/28/24 2117  CHOL 105  HDL 51  LDLCALC 40  TRIG 68  CHOLHDL 2.1   Thyroid  Function Tests: No results for input(s): TSH, T4TOTAL, FREET4, T3FREE, THYROIDAB in the last 72 hours. Anemia Panel: No results for input(s): VITAMINB12, FOLATE, FERRITIN, TIBC, IRON, RETICCTPCT in the last 72 hours. Urine analysis:    Component Value Date/Time   COLORURINE AMBER (A) 07/02/2024 1433   APPEARANCEUR TURBID (A) 07/02/2024 1433   LABSPEC 1.015 07/02/2024 1433   PHURINE 8.0 07/02/2024 1433   GLUCOSEU NEGATIVE 07/02/2024 1433   GLUCOSEU NEGATIVE 07/23/2016 1528   HGBUR LARGE (A) 07/02/2024 1433   BILIRUBINUR NEGATIVE 07/02/2024 1433   BILIRUBINUR Negative 09/10/2018 1043   KETONESUR NEGATIVE 07/02/2024 1433    PROTEINUR >300 (A) 07/02/2024 1433   UROBILINOGEN negative (A) 09/10/2018 1043   UROBILINOGEN 0.2 07/23/2016 1528   NITRITE POSITIVE (A) 07/02/2024 1433   LEUKOCYTESUR LARGE (A) 07/02/2024 1433  Sepsis Labs: @LABRCNTIP (procalcitonin:4,lacticidven:4) ) Recent Results (from the past 240 hours)  Resp panel by RT-PCR (RSV, Flu A&B, Covid) Anterior Nasal Swab     Status: None   Collection Time: 07/28/24 10:38 PM   Specimen: Anterior Nasal Swab  Result Value Ref Range Status   SARS Coronavirus 2 by RT PCR NEGATIVE NEGATIVE Final   Influenza A by PCR NEGATIVE NEGATIVE Final   Influenza B by PCR NEGATIVE NEGATIVE Final    Comment: (NOTE) The Xpert Xpress SARS-CoV-2/FLU/RSV plus assay is intended as an aid in the diagnosis of influenza from Nasopharyngeal swab specimens and should not be used as a sole basis for treatment. Nasal washings and aspirates are unacceptable for Xpert Xpress SARS-CoV-2/FLU/RSV testing.  Fact Sheet for Patients: bloggercourse.com  Fact Sheet for Healthcare Providers: seriousbroker.it  This test is not yet approved or cleared by the [place]  FDA and has been authorized for detection and/or diagnosis of SARS-CoV-2 by FDA under an Emergency Use Authorization (EUA). This EUA will remain in effect (meaning this test can be used) for the duration of the COVID-19 declaration under Section 564(b)(1) of the Act, 21 U.S.C. section 360bbb-3(b)(1), unless the authorization is terminated or revoked.     Resp Syncytial Virus by PCR NEGATIVE NEGATIVE Final    Comment: (NOTE) Fact Sheet for Patients: bloggercourse.com  Fact Sheet for Healthcare Providers: seriousbroker.it  This test is not yet approved or cleared by the [place]  FDA and has been authorized for detection and/or diagnosis of SARS-CoV-2 by FDA under an Emergency Use Authorization (EUA). This EUA  will remain in effect (meaning this test can be used) for the duration of the COVID-19 declaration under Section 564(b)(1) of the Act, 21 U.S.C. section 360bbb-3(b)(1), unless the authorization is terminated or revoked.  Performed at [facility], [address], [place], [place] [address]      Radiological Exams on Admission: DG Chest Port 1 View Result Date: 07/28/2024 EXAM: 1 VIEW(S) XRAY OF THE CHEST 07/28/2024 09:14:00 PM COMPARISON: 06/29/2024 CLINICAL HISTORY: Chills, cough, pleuritic chest pain, and shortness of breath. History of clots and recent non-ST elevation myocardial infarction. FINDINGS: LUNGS AND PLEURA: Persistent low lung volumes with patchy bibasilar opacities, similar to recent radiographs. Mild pulmonary edema. Trace pleural effusions. No pneumothorax. HEART AND MEDIASTINUM: Cardiomegaly. Stable mediastinal contours with aortic atherosclerosis. BONES AND SOFT TISSUES: Degenerative changes in spine and shoulders. IMPRESSION: 1. Persistent low lung volumes with patchy bibasilar opacities, similar to recent radiographs. 2. Mild pulmonary edema with trace pleural effusions. 3. Cardiomegaly. Electronically signed by: Oneil Devonshire MD 07/28/2024 09:18 PM EST RP Workstation: [id number]    EKG: Independently reviewed.  Normal sinus rhythm RBBB.  Nonspecific ST-T changes.  Assessment/Plan Principal Problem:   Non-ST elevation MI (NSTEMI) (HCC)    Non-ST elevation MI appreciate cardiology consult.  Plan is to treat conservatively with heparin  infusion continue antiplatelet agents beta-blockers and statins.  Last cardiac cath on 06/30/2024.  Showed complex coronary disease and recommended medical management. Cough patient has been having cough for last few days.  Chest x-ray shows chronic changes.  Patient is afebrile.  States his cough is mostly associated with eating.  Will get speech therapy evaluation. Hypertension on beta-blockers hydralazine  and lisinopril  and  Lasix . Chronic anemia follow CBC. Restless leg syndrome on Requip . Sleep apnea on CPAP at bedtime. GERD on PPI with prior history of GI bleed. Neuropathy on Lyrica . Hypothyroidism on Synthroid .  Since patient has non-ST elevation MI will need further management and more than  2 midnight stay.  DVT prophylaxis: Heparin  infusion. Code Status:.  DNR. Family Communication: Discussed with patient. Disposition Plan: Monitored bed. Consults called: Cardiology. Admission status: Inpatient.         [1] No Known Allergies  "

## 2024-07-28 NOTE — ED Provider Notes (Signed)
 " [facility] Provider Note   CSN: [id number] Arrival date & time: 07/28/24  2040     Patient presents with: No chief complaint on file.   Brandon Mason is a 89 y.o. male.   The history is provided by the patient and medical records. No language interpreter was used.  Chest Pain Pain location:  Substernal area Pain quality: aching, dull, pressure and radiating   Pain radiates to:  L arm Pain severity:  Severe Onset quality:  Gradual Timing:  Constant Progression:  Worsening Chronicity:  New Relieved by:  Nothing Worsened by:  Coughing Ineffective treatments:  None tried Associated symptoms: cough, fatigue, nausea, shortness of breath and vomiting   Associated symptoms: no abdominal pain, no back pain, no dizziness, no fever, no headache, no lower extremity edema, no palpitations and no weakness        Prior to Admission medications  Medication Sig Start Date End Date Taking? Authorizing Provider  albuterol  (VENTOLIN  HFA) 108 (90 Base) MCG/ACT inhaler Inhale 2 puffs into the lungs every 4 (four) hours as needed. 05/21/24   [provider]  aspirin  EC 81 MG tablet Take 1 tablet (81 mg total) by mouth daily. Swallow whole. 07/03/24   Leotis Bogus, MD  atorvastatin  (LIPITOR) 80 MG tablet Take 1 tablet (80 mg total) by mouth daily. 07/21/24   Parcells, Waddell LABOR, [name]  cholecalciferol (VITAMIN D3) 25 MCG (1000 UNIT) tablet Take 1,000 Units by mouth 2 (two) times daily.    [provider]  clopidogrel  (PLAVIX ) 75 MG tablet Take 1 tablet (75 mg total) by mouth daily. 07/21/24   Parcells, Waddell LABOR, [name]  docusate sodium  (COLACE) 100 MG capsule Take 1 capsule (100 mg total) by mouth 2 (two) times daily. Patient taking differently: Take 100 mg by mouth 2 (two) times daily as needed for mild constipation. 09/02/22   Vicci Burnard SAUNDERS, [name]  finasteride  (PROSCAR ) 5 MG tablet TAKE 1 TABLET BY MOUTH  DAILY Patient taking differently:  Take 5 mg by mouth daily. 05/03/21   Saguier, [place], [name]  furosemide  (LASIX ) 20 MG tablet Take 20 mg by mouth every other day. Take 1 Tablet Every other Day.    [provider]  hydrALAZINE  (APRESOLINE ) 50 MG tablet Take 50 mg by mouth 2 (two) times daily. 05/14/24   [provider]  levothyroxine  (SYNTHROID ) 25 MCG tablet Take 1 tablet by mouth daily.    [provider]  lisinopril  (ZESTRIL ) 10 MG tablet Take 2 tablets by mouth daily.    [provider]  metoprolol  tartrate (LOPRESSOR ) 25 MG tablet Take 0.5 tablets (12.5 mg total) by mouth 2 (two) times daily. 07/21/24 08/20/24  Parcells, Waddell LABOR, [name]  Multiple Vitamins-Minerals (ONE A DAY MEN 50 PLUS PO) Take 1 tablet by mouth daily at 12 noon.    [provider]  naloxone [facility]) nasal spray 4 mg/0.1 mL     [provider]  nystatin  cream (MYCOSTATIN ) Apply 1 Application topically 2 (two) times daily. Apply a thin layer topically twice per day to groin area    [provider]  nystatin -triamcinolone  (MYCOLOG II) cream Apply 1 Application topically 2 (two) times daily. Apply a thin layer topically to lower back and/or groin area. 09/29/23   [provider]  OXYGEN  Inhale 3 L into the lungs continuous.    [provider]  pantoprazole  (PROTONIX ) 40 MG tablet Take 1 tablet (40 mg total) by mouth daily at 6 (  six) AM. 07/21/24 07/21/25  Parcells, Waddell A, [name]  polyethylene glycol (MIRALAX  / GLYCOLAX ) 17 g packet Take 17 g by mouth daily as needed. 09/02/22   Vicci Burnard SAUNDERS, [name]  prednisoLONE acetate (PRED FORTE) 1 % ophthalmic suspension Place 1 drop into the right eye 2 (two) times daily. 06/02/24   [provider]  pregabalin  (LYRICA ) 150 MG capsule Take 1 capsule (150 mg total) by mouth 2 (two) times daily. 09/09/22   [name], [name], [name]  rOPINIRole  (REQUIP ) 1 MG tablet Take 1 mg by mouth at bedtime. Take one tablet (1mg ) with one tablet (2mg ) by mouth at  bedtime for a combined dose of 3mg  at bedtime.    [provider]  rOPINIRole  (REQUIP ) 2 MG tablet Take 2 mg by mouth 3 (three) times daily. Take one tablet (2mg ) by mouth daily at noon and 1700.  Take one tablet (2mg ) with one tablet (1mg ) by mouth at bedtime for a combined dose of 3mg  at bedtime.    [provider]  traMADol  (ULTRAM ) 50 MG tablet Take 1 tablet by mouth 4 (four) times daily. 09/24/22   [provider]    Allergies: Patient has no known allergies.    Review of Systems  Constitutional:  Positive for chills and fatigue. Negative for fever.  HENT:  Negative for congestion.   Respiratory:  Positive for cough, chest tightness and shortness of breath. Negative for wheezing.   Cardiovascular:  Positive for chest pain. Negative for palpitations and leg swelling.  Gastrointestinal:  Positive for nausea and vomiting. Negative for abdominal pain, constipation and diarrhea.  Genitourinary:  Negative for dysuria and flank pain.  Musculoskeletal:  Negative for back pain, neck pain and neck stiffness.  Skin:  Negative for rash and wound.  Neurological:  Positive for light-headedness. Negative for dizziness, weakness and headaches.  Psychiatric/Behavioral:  Negative for agitation and confusion.   All other systems reviewed and are negative.   Updated Vital Signs There were no vitals taken for this visit.  Physical Exam Vitals and nursing note reviewed.  Constitutional:      General: He is not in acute distress.    Appearance: He is well-developed. He is not ill-appearing, toxic-appearing or diaphoretic.  HENT:     Head: Normocephalic and atraumatic.  Eyes:     Conjunctiva/sclera: Conjunctivae normal.     Pupils: Pupils are equal, round, and reactive to light.  Cardiovascular:     Rate and Rhythm: Normal rate and regular rhythm.     Heart sounds: No murmur heard. Pulmonary:     Effort: Pulmonary effort is normal. No tachypnea or respiratory distress.      Breath sounds: Normal breath sounds. No decreased breath sounds, wheezing, rhonchi or rales.  Chest:     Chest wall: No tenderness.  Abdominal:     Palpations: Abdomen is soft.     Tenderness: There is no abdominal tenderness.  Musculoskeletal:        General: No swelling.     Cervical back: Neck supple.     Right lower leg: No edema.     Left lower leg: No edema.  Skin:    General: Skin is warm and dry.     Capillary Refill: Capillary refill takes less than 2 seconds.     Findings: No erythema.  Neurological:     General: No focal deficit present.     Mental Status: He is alert.  Psychiatric:        Mood and  Affect: Mood normal.     (all labs ordered are listed, but only abnormal results are displayed) Labs Reviewed  PRO BRAIN NATRIURETIC PEPTIDE - Abnormal; Notable for the following components:      Result Value   Pro Brain Natriuretic Peptide 1,226.0 (*)    All other components within normal limits  CBC WITH DIFFERENTIAL/PLATELET - Abnormal; Notable for the following components:   RBC 3.55 (*)    Hemoglobin 11.8 (*)    HCT 35.9 (*)    MCV 101.1 (*)    All other components within normal limits  APTT - Abnormal; Notable for the following components:   aPTT 183 (*)    All other components within normal limits  COMPREHENSIVE METABOLIC PANEL WITH GFR - Abnormal; Notable for the following components:   Potassium 3.3 (*)    Glucose, Bld 118 (*)    BUN 26 (*)    All other components within normal limits  I-STAT CG4 LACTIC ACID, ED - Abnormal; Notable for the following components:   Lactic Acid, Venous 2.4 (*)    All other components within normal limits  TROPONIN T, HIGH SENSITIVITY - Abnormal; Notable for the following components:   Troponin T High Sensitivity 45 (*)    All other components within normal limits  RESP PANEL BY RT-PCR (RSV, FLU A&B, COVID)  RVPGX2  CULTURE, BLOOD (ROUTINE X 2)  CULTURE, BLOOD (ROUTINE X 2)  LIPASE, BLOOD  PROTIME-INR  LIPID PANEL   HEMOGLOBIN A1C  TROPONIN T, HIGH SENSITIVITY    EKG: EKG Interpretation Date/Time:  Wednesday July 28 2024 20:49:37 EST Ventricular Rate:  94 PR Interval:  185 QRS Duration:  160 QT Interval:  387 QTC Calculation: 484 R Axis:   -62  Text Interpretation: Sinus rhythm Right bundle branch block LVH with IVCD and secondary repol abnrm Inferior infarct, acute >>> Acute MI <<< when compared to prior, concerning for STEMI Confirmed by Ginger Barefoot ([id number]) on 07/28/2024 9:15:58 PM  Radiology: [facility] Chest Port 1 View Result Date: 07/28/2024 EXAM: 1 VIEW(S) XRAY OF THE CHEST 07/28/2024 09:14:00 PM COMPARISON: 06/29/2024 CLINICAL HISTORY: Chills, cough, pleuritic chest pain, and shortness of breath. History of clots and recent non-ST elevation myocardial infarction. FINDINGS: LUNGS AND PLEURA: Persistent low lung volumes with patchy bibasilar opacities, similar to recent radiographs. Mild pulmonary edema. Trace pleural effusions. No pneumothorax. HEART AND MEDIASTINUM: Cardiomegaly. Stable mediastinal contours with aortic atherosclerosis. BONES AND SOFT TISSUES: Degenerative changes in spine and shoulders. IMPRESSION: 1. Persistent low lung volumes with patchy bibasilar opacities, similar to recent radiographs. 2. Mild pulmonary edema with trace pleural effusions. 3. Cardiomegaly. Electronically signed by: Oneil Devonshire MD 07/28/2024 09:18 PM EST RP Workstation: [id number]     Procedures   CRITICAL CARE Performed by: Lonni PARAS Carlia Bomkamp Total critical care time: 35 minutes Critical care time was exclusive of separately billable procedures and treating other patients. Critical care was necessary to treat or prevent imminent or life-threatening deterioration. Critical care was time spent personally by me on the following activities: development of treatment plan with patient and/or surrogate as well as nursing, discussions with consultants, evaluation of patient's response to treatment,  examination of patient, obtaining history from patient or surrogate, ordering and performing treatments and interventions, ordering and review of laboratory studies, ordering and review of radiographic studies, pulse oximetry and re-evaluation of patient's condition.  Medications Ordered in the ED  0.9 %  sodium chloride  infusion ( Intravenous New Bag/Given 07/28/24 2114)  aspirin  chewable tablet 324 mg (324 mg  Oral Given 07/28/24 2114)  heparin  injection 4,000 Units (4,000 Units Intravenous Given 07/28/24 2111)  fentaNYL  (SUBLIMAZE ) injection 50 mcg (50 mcg Intravenous Given 07/28/24 2227)                                    Medical Decision Making Amount and/or Complexity of Data Reviewed Labs: ordered. Radiology: ordered.  Risk OTC drugs. Prescription drug management. Decision regarding hospitalization.    Brandon Mason is a 89 y.o. male with a past medical history significant for recent NSTEMI several weeks ago, hypothyroidism, hypertension, cardiomegaly, CKD, restless legs, previous stroke, and previous DVT not on blood thinners who presents for chest pain and shortness of breath.  According to patient, he had been doing fairly well aside from some chills and productive cough for the last few days but this evening reports onset of discomfort in his chest that goes down his left arm with diaphoresis nausea, vomiting and shortness of breath.  He does say it hurts to take a deep breath.  He reports something feels wrong.  He reports no abdominal pain.  Denies any constipation, diarrhea, or urinary changes.  Denies leg pain or leg swelling.   EKG on arrival does show new ST depressions in lead I, aVL, and V2 and elevation in lead III and aVF.  This appears different than his last EKG during the admission.  Will activate a code STEMI.  On my exam, lungs were relatively clear and I did not appreciate wheezing that EMS heard earlier.  He received albuterol  and DuoNeb with EMS.  Chest was  nontender.  He did have a slight murmur.  Abdomen nontender.  He had intact pulses in upper extremities for me.  Legs are nontender and not critically edematous.  Patient is ill-appearing and slightly diaphoretic.  Will activate code STEMI.  Will also workup for infectious etiology.  Anticipate follow-up on cardiology recommendations and anticipate admission.  9:10 PM Spoke to Dr. Jordan who look at the EKG and they also reviewed his recent cath and they feel that he may not be the best candidate for PCI at this time and therefore they we will cancel the code STEMI.  Will consult the regular cardiology team and anticipate admission for further management.  9:39 PM Consulting cardiology team is here as well now.  They agree and recommend a medicine admission so once labs are back we will call for medicine admission.  Lactic acid returned at 2.4.  X-ray shows similar changes to prior with some low lung volumes and patchy opacities similar to prior.  No pneumothorax.  Trace effusions.  Cardiomegaly present still.  10:47 PM Workup continues to return.  Lactic acid elevated at 2.4 but white count is normal.  Mild anemia.  Metabolic panel shows slightly low potassium but otherwise sodium kidney function liver function not abnormal.    Lipid panel unremarkable.  Troponin is much improved at 45 down from 1200 several weeks ago.  proBNP is also improved.  Chest x-ray appears similar to prior.  Will admit to medicine for further management of concerning chest pain being followed by cardiology.  Still waiting on the viral swab results given his cough he may also have viral illness.     Final diagnoses:  Chest pain, unspecified type  Shortness of breath  Acute cough  Chills    Clinical Impression: 1. Chest pain, unspecified type   2. Shortness of  breath   3. Acute cough   4. Chills     Disposition: Admit  This note was prepared with assistance of Dragon voice recognition software.  Occasional wrong-word or sound-a-like substitutions may have occurred due to the inherent limitations of voice recognition software.      Chellie Vanlue, Lonni PARAS, MD 07/28/24 2300  "

## 2024-07-28 NOTE — Consult Note (Addendum)
 "  Cardiology Consultation   Patient ID: Brandon Mason MRN: [id number]; DOB: [birth year]  Admit date: 07/28/2024 Date of Consult: 07/28/2024  PCP:  Franco Salts   [facility] Providers Cardiologist:  Jerel Balding, Mason        Patient Profile: Brandon Mason is a 89 y.o. male with a hx of CAD s/p NSTEMI with LHC showing complex bifurcation disease treated medically in 07/2024 , hypertension, hyperlipidemia, TDM-II, obstructive sleep apnea, CVA, moderate bilateral carotid artery stenosis, 75% right subclavian artery stenosis, prior DVT, history of herniated cervical disc, prior GIB, OSA on CPAP,  who is being seen 07/28/2024 for the evaluation of chest pain at the request of the Emergency department.  History of Present Illness: [name] states he developed nonradiating chest pressure and chills that started sometime on the day of his ER visit.  He also has shortness of breath and a persistent dry cough.  Patient states he was around someone sick sometime last week.  Patient states he was also nauseous.  Denies syncope.  Patient has prior history of NSTEMI.  Left heart cath was done on 06/30/2024 .  Patient has a complex bifurcation at the LAD-D1 territory with 80 to 95% stenosis.  In the complexity of the lesion, medical therapy was recommended.  Last echocardiogram in 2023 showed an EF of 50 to 55%   Upon arrival to the ER, ECG was concerning for ST elevation infarct.  Cath Lab was initially activated, however ECG was reviewed by Dr. Jordan, who did not think the ECG met STEMI criteria.  Notable labs include blood pressure 141-149/78-87, heart rate sinus rhythm in the 80s to 90s, high-sensitivity troponin 45, potassium 3.3, serum creatinine 1.05, lactate 2.4, proBNP 1226.  Past Medical History:  Diagnosis Date   Abnormality of gait 02/09/2013   Arthritis    Cancer (HCC)    basal cell skin cancer   Cardiomegaly    Chronic kidney disease    BPH   Chronic leg pain     Hypersomnia    Hypertension    Hypoxia    pulmonary   Neuromuscular disorder (HCC)    restless leg syndrome    Nocturia    Prostate disorder    Restless legs syndrome (RLS) 02/09/2013   Restless legs syndrome with nocturnal myoclonus    RLS (restless legs syndrome)    x 40 years   Shortness of breath    with exertion    Sleep apnea    uses cpap   Stroke (HCC)    minor stroke in 2003 - no lasting side effects   UARS (upper airway resistance syndrome) 06/28/2014    Past Surgical History:  Procedure Laterality Date   ANTERIOR CERVICAL DECOMP/DISCECTOMY FUSION N/A 03/29/2016   Procedure: ANTERIOR CERVICAL DECOMPRESSION/DISCECTOMY FUSION CERVICAL THREE- CERVICAL FOUR;  Surgeon: Brandon Mason;  Location: [facility] NEURO ORS;  Service: Neurosurgery;  Laterality: N/A;   BACK SURGERY  2003   L4-5   CATARACT EXTRACTION Bilateral    ESOPHAGOGASTRODUODENOSCOPY (EGD) WITH PROPOFOL  N/A 03/17/2022   Procedure: ESOPHAGOGASTRODUODENOSCOPY (EGD) WITH PROPOFOL ;  Surgeon: Brandon Mason;  Location: [facility] ENDOSCOPY;  Service: Gastroenterology;  Laterality: N/A;   HERNIA REPAIR     inguinal hernia   LEFT HEART CATH AND CORONARY ANGIOGRAPHY N/A 06/30/2024   Procedure: LEFT HEART CATH AND CORONARY ANGIOGRAPHY;  Surgeon: Brandon Mason;  Location: [facility] INVASIVE CV LAB;  Service: Cardiovascular;  Laterality: N/A;   TRANSURETHRAL PROSTATECTOMY WITH GYRUS INSTRUMENTS  04/28/2012  Procedure: TRANSURETHRAL PROSTATECTOMY WITH GYRUS INSTRUMENTS;  Surgeon: Brandon Mason;  Location: [facility] ORS;  Service: Urology;  Laterality: N/A;   TRANSURETHRAL RESECTION OF PROSTATE  03/24/2012   Procedure: TRANSURETHRAL RESECTION OF THE PROSTATE (TURP);  Surgeon: Brandon Mason;  Location: [facility] ORS;  Service: Urology;  Laterality: N/A;  with gyrus ,Greenlight PVP laser of Prostate     Home Medications:  Prior to Admission medications  Medication Sig Start Date End Date Taking? Authorizing Provider  albuterol   (VENTOLIN  HFA) 108 (90 Base) MCG/ACT inhaler Inhale 2 puffs into the lungs every 4 (four) hours as needed. 05/21/24   Provider, Historical, Mason  aspirin  EC 81 MG tablet Take 1 tablet (81 mg total) by mouth daily. Swallow whole. 07/03/24   Brandon Mason  atorvastatin  (LIPITOR) 80 MG tablet Take 1 tablet (80 mg total) by mouth daily. 07/21/24   Brandon Mason  cholecalciferol (VITAMIN D3) 25 MCG (1000 UNIT) tablet Take 1,000 Units by mouth 2 (two) times daily.    Provider, Historical, Mason  clopidogrel  (PLAVIX ) 75 MG tablet Take 1 tablet (75 mg total) by mouth daily. 07/21/24   Brandon Mason  docusate sodium  (COLACE) 100 MG capsule Take 1 capsule (100 mg total) by mouth 2 (two) times daily. Patient taking differently: Take 100 mg by mouth 2 (two) times daily as needed for mild constipation. 09/02/22   Brandon Mason  finasteride  (PROSCAR ) 5 MG tablet TAKE 1 TABLET BY MOUTH  DAILY Patient taking differently: Take 5 mg by mouth daily. 05/03/21   Brandon Mason  furosemide  (LASIX ) 20 MG tablet Take 20 mg by mouth every other day. Take 1 Tablet Every other Day.    Provider, Historical, Mason  hydrALAZINE  (APRESOLINE ) 50 MG tablet Take 50 mg by mouth 2 (two) times daily. 05/14/24   Provider, Historical, Mason  levothyroxine  (SYNTHROID ) 25 MCG tablet Take 1 tablet by mouth daily.    Provider, Historical, Mason  lisinopril  (ZESTRIL ) 10 MG tablet Take 2 tablets by mouth daily.    Provider, Historical, Mason  metoprolol  tartrate (LOPRESSOR ) 25 MG tablet Take 0.5 tablets (12.5 mg total) by mouth 2 (two) times daily. 07/21/24 08/20/24  Brandon Mason  Multiple Vitamins-Minerals (ONE A DAY MEN 50 PLUS PO) Take 1 tablet by mouth daily at 12 noon.    Provider, Historical, Mason  naloxone [facility]) nasal spray 4 mg/0.1 mL     Provider, Historical, Mason  nystatin  cream (MYCOSTATIN ) Apply 1 Application topically 2 (two) times daily. Apply a thin layer topically twice per day to groin area     Provider, Historical, Mason  nystatin -triamcinolone  (MYCOLOG II) cream Apply 1 Application topically 2 (two) times daily. Apply a thin layer topically to lower back and/or groin area. 09/29/23   Provider, Historical, Mason  OXYGEN  Inhale 3 L into the lungs continuous.    Provider, Historical, Mason  pantoprazole  (PROTONIX ) 40 MG tablet Take 1 tablet (40 mg total) by mouth daily at 6 (six) AM. 07/21/24 07/21/25  Parcells, Waddell A, Mason  polyethylene glycol (MIRALAX  / GLYCOLAX ) 17 g packet Take 17 g by mouth daily as needed. 09/02/22   Brandon Mason  prednisoLONE acetate (PRED FORTE) 1 % ophthalmic suspension Place 1 drop into the right eye 2 (two) times daily. 06/02/24   Provider, Historical, Mason  pregabalin  (LYRICA ) 150 MG capsule Take 1 capsule (150 mg total) by mouth 2 (two) times daily. 09/09/22   [name], [name], Mason  rOPINIRole  (REQUIP ) 1 MG tablet Take 1 mg by mouth at bedtime. Take one tablet (1mg ) with one tablet (2mg ) by mouth at bedtime for a combined dose of 3mg  at bedtime.    Provider, Historical, Mason  rOPINIRole  (REQUIP ) 2 MG tablet Take 2 mg by mouth 3 (three) times daily. Take one tablet (2mg ) by mouth daily at noon and 1700.  Take one tablet (2mg ) with one tablet (1mg ) by mouth at bedtime for a combined dose of 3mg  at bedtime.    Provider, Historical, Mason  traMADol  (ULTRAM ) 50 MG tablet Take 1 tablet by mouth 4 (four) times daily. 09/24/22   Provider, Historical, Mason    Scheduled Meds:  Continuous Infusions:  sodium chloride  10 mL/hr at 07/28/24 2114   PRN Meds:   Allergies:   Allergies[1]  Social History:   Social History   Socioeconomic History   Marital status: Married    Spouse name: Ann   Number of children: 5   Years of education: 12   Highest education level: Not on file  Occupational History   Occupation: retired     Comment: Production Designer, Theatre/television/film for a museum/gallery curator  Tobacco Use   Smoking status: Never   Smokeless tobacco: Never  Substance and Sexual Activity   Alcohol  use: Yes    Alcohol/week: 2.0 standard drinks of alcohol    Types: 2 Cans of beer per week    Comment: weekly   Drug use: No   Sexual activity: Not on file  Other Topics Concern   Not on file  Social History Narrative   Patient lives at home with his wife Jenkins.   Patient is retired.    Patient has 5 children.   Patient has a high school education.   Patient is right-handed.   Patient drinks very little caffeine.   Social Drivers of Health   Tobacco Use: Low Risk (07/21/2024)   Patient History    Smoking Tobacco Use: Never    Smokeless Tobacco Use: Never    Passive Exposure: Not on file  Financial Resource Strain: Low Risk (04/13/2024)   Received from [facility]   Overall Financial Resource Strain (CARDIA)    How hard is it for you to pay for the very basics like food, housing, medical care, and heating?: Not hard at all  Food Insecurity: No Food Insecurity (06/30/2024)   Epic    Worried About Programme Researcher, Broadcasting/film/video in the Last Year: Never true    Ran Out of Food in the Last Year: Never true  Transportation Needs: No Transportation Needs (06/30/2024)   Epic    Lack of Transportation (Medical): No    Lack of Transportation (Non-Medical): No  Physical Activity: Inactive (04/13/2024)   Received from [facility]   Exercise Vital Sign    On average, how many days per week do you engage in moderate to strenuous exercise (like a brisk walk)?: 0 days    Minutes of Exercise per Session: Not on file  Stress: No Stress Concern Present (04/13/2024)   Received from [facility] - Occupational Stress Questionnaire    Do you feel stress - tense, restless, nervous, or anxious, or unable to sleep at night because your mind is troubled all the time - these days?: Only a little  Social Connections: Moderately Integrated (06/30/2024)   Social Connection and Isolation Panel    Frequency of Communication with Friends and Family: Three times a week  Frequency of Social Gatherings with Friends and Family: Three times a week    Attends Religious Services: 1 to 4 times per year    Active Member of Clubs or Organizations: Yes    Attends Banker Meetings: More than 4 times per year    Marital Status: Widowed  Intimate Partner Violence: Not At Risk (06/30/2024)   Epic    Fear of Current or Ex-Partner: No    Emotionally Abused: No    Physically Abused: No    Sexually Abused: No  Depression (PHQ2-9): Not on file  Alcohol Screen: Not on file  Housing: Low Risk (06/30/2024)   Epic    Unable to Pay for Housing in the Last Year: No    Number of Times Moved in the Last Year: 0    Homeless in the Last Year: No  Utilities: Not At Risk (06/30/2024)   Epic    Threatened with loss of utilities: No  Health Literacy: Not on file    Family History:    Family History  Problem Relation Age of Onset   Restless legs syndrome Mother    Restless legs syndrome Daughter    Esophageal cancer Neg Hx    Colon cancer Neg Hx    Pancreatic cancer Neg Hx    Stomach cancer Neg Hx      ROS:  Please see the history of present illness.   All other ROS reviewed and negative.     Physical Exam/Data: Vitals:   07/28/24 2109 07/28/24 2115 07/28/24 2122 07/28/24 2130  BP:  (!) 132/94  (!) 149/78  Pulse: 95 96  92  Resp: (!) 22 (!) 29  (!) 28  Temp:      SpO2: 97% 97%  98%  Weight:   81.6 kg   Height:   5' 8 (1.727 m)    No intake or output data in the 24 hours ending 07/28/24 2238    07/28/2024    9:22 PM 07/21/2024    3:01 PM 06/29/2024   12:12 PM  Last 3 Weights  Weight (lbs) 180 lb 186 lb 180 lb  Weight (kg) 81.647 kg 84.369 kg 81.647 kg     Body mass index is 27.37 kg/m.  General:  in moderate distress HEENT: normal Neck: no JVD Vascular: No carotid bruits; Distal pulses 2+ bilaterally Cardiac:  normal S1, S2; RRR; no murmur  Lungs:  clear to auscultation bilaterally, no wheezing, rhonchi or rales  Abd: soft, nontender,  no hepatomegaly  Ext: no edema Musculoskeletal:  No deformities, BUE and BLE strength normal and equal Skin: warm and dry  Neuro:  CNs 2-12 intact, no focal abnormalities noted Psych:  Normal affect   EKG:  The EKG was personally reviewed and demonstrates:  sinus rhythm, right bundle branch block,  Telemetry:  Telemetry was personally reviewed and demonstrates:  sinus rhythm  Relevant CV Studies: reviewed  Laboratory Data: High Sensitivity Troponin:  No results for input(s): TROPONINIHS in the last 720 hours.  Recent Labs  Lab 06/29/24 2102 06/30/24 0701 06/30/24 0843 07/28/24 2117  TRNPT 1,130* 1,188* 1,223* 45*      Chemistry Recent Labs  Lab 07/28/24 2117  NA 141  K 3.3*  CL 102  CO2 24  GLUCOSE 118*  BUN 26*  CREATININE 1.05  CALCIUM  9.1  GFRNONAA >60  ANIONGAP 15    Recent Labs  Lab 07/28/24 2117  PROT 7.2  ALBUMIN 3.9  AST 23  ALT 11  ALKPHOS 60  BILITOT 0.8   Lipids  Recent Labs  Lab 07/28/24 2117  CHOL 105  TRIG 68  HDL 51  LDLCALC 40  CHOLHDL 2.1    Hematology Recent Labs  Lab 07/28/24 2117  WBC 7.5  RBC 3.55*  HGB 11.8*  HCT 35.9*  MCV 101.1*  MCH 33.2  MCHC 32.9  RDW 13.7  PLT 228   Thyroid  No results for input(s): TSH, FREET4 in the last 168 hours.  BNP Recent Labs  Lab 07/28/24 2117  PROBNP 1,226.0*    DDimer No results for input(s): DDIMER in the last 168 hours.  Radiology/Studies:  DG Chest Port 1 View Result Date: 07/28/2024 EXAM: 1 VIEW(S) XRAY OF THE CHEST 07/28/2024 09:14:00 PM COMPARISON: 06/29/2024 CLINICAL HISTORY: Chills, cough, pleuritic chest pain, and shortness of breath. History of clots and recent non-ST elevation myocardial infarction. FINDINGS: LUNGS AND PLEURA: Persistent low lung volumes with patchy bibasilar opacities, similar to recent radiographs. Mild pulmonary edema. Trace pleural effusions. No pneumothorax. HEART AND MEDIASTINUM: Cardiomegaly. Stable mediastinal contours with aortic  atherosclerosis. BONES AND SOFT TISSUES: Degenerative changes in spine and shoulders. IMPRESSION: 1. Persistent low lung volumes with patchy bibasilar opacities, similar to recent radiographs. 2. Mild pulmonary edema with trace pleural effusions. 3. Cardiomegaly. Electronically signed by: Oneil Devonshire Mason 07/28/2024 09:18 PM EST RP Workstation: [id number]     Assessment and Plan:  JOHNTAE BROXTERMAN is a 89 y.o. male with a hx of CAD s/p NSTEMI with LHC showing complex bifurcation disease treated medically in 07/2024 , hypertension, hyperlipidemia, TDM-II, obstructive sleep apnea, CVA, moderate bilateral carotid artery stenosis, 75% right subclavian artery stenosis, prior DVT, history of herniated cervical disc, prior GIB, OSA on CPAP,  who is being seen 07/28/2024 for the evaluation of chest pain at the request of the Emergency department.  #Chest pain While patient has chest pain, which likely cardiac in nature, he has other symptoms that are not related to his chest pain such as chills, and shortness of breath.  Infectious workup should be pursued to rule out other acute illnesses going on.  [place] case with Dr. Jordan.  We will medically manage his coronary artery disease.  He is not a candidate for PCI or MCS at this time.  Would suggest referral to palliative care for management of symptoms and goals of care conversation Infectious workup Recommend consult to palliative care Start IV heparin  Continue aspirin  Continue Plavix  \   Risk Assessment/Risk Scores:    TIMI Risk Score for Unstable Angina or Non-ST Elevation MI:   The patient's TIMI risk score is  , which indicates a  % risk of all cause mortality, new or recurrent myocardial infarction or need for urgent revascularization in the next 14 days.         For questions or updates, please contact [facility] Please consult [email or web address] for contact info under      Signed, Arieon Scalzo A Jermiyah Ricotta, Mason  07/28/2024 10:38 PM       [1] No Known Allergies  "

## 2024-07-28 NOTE — ED Triage Notes (Addendum)
 Pt BIB [facility] for c/o SOB w/ acute onset today. Pt on 5L [place] per EMS at home baseline. S/p MI x3 weeks ago. Per EMS pt denies any CP but endorses a funny feeling that on arrival pt describes as tightness. Per EMS, pt w/ insp. And exp. Wheezes and received albuterol  and duoneb in route. On arrival, pt is nauseated w/ 1 episode of emesis.   EMS VITALS: O2 92% 5L [place]  BP 166/86 CBG 131 ETCO2 16    18RFA

## 2024-07-28 NOTE — Progress Notes (Signed)
" °   07/28/24 2118  Spiritual Encounters  Type of Visit Attempt (pt unavailable)  Reason for visit Code  OnCall Visit Yes   Responded to code STEMI, later cancelled.  "

## 2024-07-29 ENCOUNTER — Inpatient Hospital Stay (HOSPITAL_COMMUNITY)

## 2024-07-29 ENCOUNTER — Encounter (HOSPITAL_COMMUNITY): Payer: Self-pay | Admitting: Internal Medicine

## 2024-07-29 ENCOUNTER — Ambulatory Visit: Admitting: Vascular Surgery

## 2024-07-29 ENCOUNTER — Ambulatory Visit (HOSPITAL_COMMUNITY)

## 2024-07-29 ENCOUNTER — Encounter: Payer: Self-pay | Admitting: Cardiovascular Disease

## 2024-07-29 DIAGNOSIS — R059 Cough, unspecified: Secondary | ICD-10-CM | POA: Insufficient documentation

## 2024-07-29 DIAGNOSIS — I214 Non-ST elevation (NSTEMI) myocardial infarction: Secondary | ICD-10-CM | POA: Diagnosis not present

## 2024-07-29 LAB — HEMOGLOBIN A1C
Hgb A1c MFr Bld: 5.9 % — ABNORMAL HIGH (ref 4.8–5.6)
Mean Plasma Glucose: 122.63 mg/dL

## 2024-07-29 LAB — COMPREHENSIVE METABOLIC PANEL WITH GFR
ALT: 8 U/L (ref 0–44)
AST: 16 U/L (ref 15–41)
Albumin: 3.4 g/dL — ABNORMAL LOW (ref 3.5–5.0)
Alkaline Phosphatase: 51 U/L (ref 38–126)
Anion gap: 11 (ref 5–15)
BUN: 24 mg/dL — ABNORMAL HIGH (ref 8–23)
CO2: 25 mmol/L (ref 22–32)
Calcium: 8.7 mg/dL — ABNORMAL LOW (ref 8.9–10.3)
Chloride: 105 mmol/L (ref 98–111)
Creatinine, Ser: 1.01 mg/dL (ref 0.61–1.24)
GFR, Estimated: >60 mL/min
Glucose, Bld: 114 mg/dL — ABNORMAL HIGH (ref 70–99)
Potassium: 3.7 mmol/L (ref 3.5–5.1)
Sodium: 140 mmol/L (ref 135–145)
Total Bilirubin: 0.6 mg/dL (ref 0.0–1.2)
Total Protein: 6.2 g/dL — ABNORMAL LOW (ref 6.5–8.1)

## 2024-07-29 LAB — CBC
HCT: 30.8 % — ABNORMAL LOW (ref 39.0–52.0)
Hemoglobin: 10.4 g/dL — ABNORMAL LOW (ref 13.0–17.0)
MCH: 33.5 pg (ref 26.0–34.0)
MCHC: 33.8 g/dL (ref 30.0–36.0)
MCV: 99.4 fL (ref 80.0–100.0)
Platelets: 206 10*3/uL (ref 150–400)
RBC: 3.1 MIL/uL — ABNORMAL LOW (ref 4.22–5.81)
RDW: 13.6 % (ref 11.5–15.5)
WBC: 7.1 10*3/uL (ref 4.0–10.5)
nRBC: 0 % (ref 0.0–0.2)

## 2024-07-29 LAB — TROPONIN T, HIGH SENSITIVITY: Troponin T High Sensitivity: 44 ng/L — ABNORMAL HIGH (ref 0–19)

## 2024-07-29 LAB — HEPARIN LEVEL (UNFRACTIONATED): Heparin Unfractionated: 0.38 [IU]/mL (ref 0.30–0.70)

## 2024-07-29 MED ORDER — POLYETHYLENE GLYCOL 3350 17 G PO PACK: 17.0000 g | PACK | Freq: Every day | ORAL | Status: DC | PRN

## 2024-07-29 MED ORDER — PREGABALIN 75 MG PO CAPS
150.0000 mg | ORAL_CAPSULE | Freq: Two times a day (BID) | ORAL | Status: DC
  Administered 2024-07-29 – 2024-08-01 (×7): 150 mg via ORAL
  Filled 2024-07-29 (×7): qty 2

## 2024-07-29 MED ORDER — ONDANSETRON HCL 4 MG/2ML IJ SOLN
4.0000 mg | Freq: Four times a day (QID) | INTRAMUSCULAR | Status: DC | PRN
  Administered 2024-07-29: 4 mg via INTRAVENOUS
  Filled 2024-07-29: qty 2

## 2024-07-29 MED ORDER — FUROSEMIDE 20 MG PO TABS: 20.0000 mg | ORAL_TABLET | ORAL | Status: DC

## 2024-07-29 MED ORDER — ROPINIROLE HCL 0.5 MG PO TABS
2.0000 mg | ORAL_TABLET | Freq: Two times a day (BID) | ORAL | Status: DC
  Administered 2024-07-29 – 2024-08-01 (×6): 2 mg via ORAL
  Filled 2024-07-29 (×7): qty 4

## 2024-07-29 MED ORDER — POTASSIUM CHLORIDE CRYS ER 20 MEQ PO TBCR
20.0000 meq | EXTENDED_RELEASE_TABLET | Freq: Once | ORAL | Status: AC
  Administered 2024-07-29: 20 meq via ORAL
  Filled 2024-07-29: qty 1

## 2024-07-29 MED ORDER — TRAMADOL HCL 50 MG PO TABS
50.0000 mg | ORAL_TABLET | Freq: Four times a day (QID) | ORAL | Status: DC
  Administered 2024-07-29 – 2024-08-01 (×14): 50 mg via ORAL
  Filled 2024-07-29 (×15): qty 1

## 2024-07-29 MED ORDER — PANTOPRAZOLE SODIUM 40 MG PO TBEC
40.0000 mg | DELAYED_RELEASE_TABLET | Freq: Every day | ORAL | Status: DC
  Administered 2024-07-29 – 2024-08-01 (×4): 40 mg via ORAL
  Filled 2024-07-29 (×4): qty 1

## 2024-07-29 MED ORDER — NITROGLYCERIN 0.4 MG SL SUBL: 0.4000 mg | SUBLINGUAL_TABLET | SUBLINGUAL | Status: DC | PRN

## 2024-07-29 MED ORDER — METOPROLOL TARTRATE 12.5 MG HALF TABLET
12.5000 mg | ORAL_TABLET | Freq: Two times a day (BID) | ORAL | Status: DC
  Administered 2024-07-29 – 2024-08-01 (×7): 12.5 mg via ORAL
  Filled 2024-07-29 (×8): qty 1

## 2024-07-29 MED ORDER — ASPIRIN 81 MG PO TBEC
81.0000 mg | DELAYED_RELEASE_TABLET | Freq: Every day | ORAL | Status: DC
  Administered 2024-07-29 – 2024-08-01 (×4): 81 mg via ORAL
  Filled 2024-07-29 (×4): qty 1

## 2024-07-29 MED ORDER — CLOPIDOGREL BISULFATE 75 MG PO TABS
75.0000 mg | ORAL_TABLET | Freq: Every day | ORAL | Status: DC
  Administered 2024-07-29 – 2024-08-01 (×4): 75 mg via ORAL
  Filled 2024-07-29 (×4): qty 1

## 2024-07-29 MED ORDER — LISINOPRIL 20 MG PO TABS
20.0000 mg | ORAL_TABLET | Freq: Every day | ORAL | Status: DC
  Administered 2024-07-29: 20 mg via ORAL
  Filled 2024-07-29: qty 1

## 2024-07-29 MED ORDER — FINASTERIDE 5 MG PO TABS
5.0000 mg | ORAL_TABLET | Freq: Every day | ORAL | Status: DC
  Administered 2024-07-29 – 2024-08-01 (×4): 5 mg via ORAL
  Filled 2024-07-29 (×4): qty 1

## 2024-07-29 MED ORDER — ROPINIROLE HCL 0.5 MG PO TABS
3.0000 mg | ORAL_TABLET | Freq: Every day | ORAL | Status: DC
  Administered 2024-07-29 – 2024-07-31 (×4): 3 mg via ORAL
  Filled 2024-07-29: qty 6
  Filled 2024-07-29: qty 3
  Filled 2024-07-29 (×2): qty 6

## 2024-07-29 MED ORDER — HYDRALAZINE HCL 50 MG PO TABS
50.0000 mg | ORAL_TABLET | Freq: Two times a day (BID) | ORAL | Status: DC
  Administered 2024-07-29 – 2024-08-01 (×6): 50 mg via ORAL
  Filled 2024-07-29 (×8): qty 1

## 2024-07-29 MED ORDER — FUROSEMIDE 20 MG PO TABS
20.0000 mg | ORAL_TABLET | Freq: Once | ORAL | Status: AC
  Administered 2024-07-29: 20 mg via ORAL
  Filled 2024-07-29: qty 1

## 2024-07-29 MED ORDER — HEPARIN (PORCINE) 25000 UT/250ML-% IV SOLN
1300.0000 [IU]/h | INTRAVENOUS | Status: AC
  Administered 2024-07-29 – 2024-07-30 (×3): 1300 [IU]/h via INTRAVENOUS
  Filled 2024-07-29 (×3): qty 250

## 2024-07-29 MED ORDER — LEVOTHYROXINE SODIUM 50 MCG PO TABS
25.0000 ug | ORAL_TABLET | Freq: Every day | ORAL | Status: DC
  Administered 2024-07-29 – 2024-08-01 (×4): 25 ug via ORAL
  Filled 2024-07-29 (×4): qty 1

## 2024-07-29 MED ORDER — ATORVASTATIN CALCIUM 80 MG PO TABS
80.0000 mg | ORAL_TABLET | Freq: Every day | ORAL | Status: DC
  Administered 2024-07-29 – 2024-08-01 (×4): 80 mg via ORAL
  Filled 2024-07-29 (×4): qty 1

## 2024-07-29 NOTE — Progress Notes (Signed)
 PHARMACY - ANTICOAGULATION CONSULT NOTE  Pharmacy Consult for heparin  Indication: medical management of CAD with new CP  Allergies[1]  Patient Measurements: Height: 5' 8 (172.7 cm) Weight: 81.6 kg (180 lb) IBW/kg (Calculated) : 68.4 HEPARIN  DW (KG): 81.6  Vital Signs: Temp: 97.7 F (36.5 C) (01/28 2100) BP: 152/88 (01/29 0030) Pulse Rate: 90 (01/29 0015)  Labs: Recent Labs    07/28/24 2117  HGB 11.8*  HCT 35.9*  PLT 228  APTT 183*  LABPROT 14.1  INR 1.0  CREATININE 1.05    Estimated Creatinine Clearance: 39.8 mL/min (by C-G formula based on SCr of 1.05 mg/dL).   Medical History: Past Medical History:  Diagnosis Date   Abnormality of gait 02/09/2013   Arthritis    Cancer (HCC)    basal cell skin cancer   Cardiomegaly    Chronic kidney disease    BPH   Chronic leg pain    Hypersomnia    Hypertension    Hypoxia    pulmonary   Neuromuscular disorder (HCC)    restless leg syndrome    Nocturia    Prostate disorder    Restless legs syndrome (RLS) 02/09/2013   Restless legs syndrome with nocturnal myoclonus    RLS (restless legs syndrome)    x 40 years   Shortness of breath    with exertion    Sleep apnea    uses cpap   Stroke (HCC)    minor stroke in 2003 - no lasting side effects   UARS (upper airway resistance syndrome) 06/28/2014    Assessment: 89yo male c/o chest pressure associated w/ SOB and chills thought to be at least partially cardiac in nature, had NSTEMI last month with severe CAD being treated medically, to begin heparin ; of note pt is not on AC PTA, aPTT elevated as it was drawn immediately after a 4000 unit heparin  bolus.  Goal of Therapy:  Heparin  level 0.3-0.7 units/ml Monitor platelets by anticoagulation protocol: Yes   Plan:  Rec'd heparin  bolus without subsequent gtt 4h ago; begin heparin  infusion at 1300 units/hr (previously therapeutic rate. Monitor heparin  levels and CBC.  Marvetta Dauphin, PharmD, BCPS  07/29/2024,1:11 AM       [1] No Known Allergies

## 2024-07-29 NOTE — Progress Notes (Signed)
" °   07/29/24 2250  BiPAP/CPAP/SIPAP  Reason BIPAP/CPAP not in use Non-compliant  BiPAP/CPAP /SiPAP Vitals  Pulse Rate 80  SpO2 100 %  Bilateral Breath Sounds Diminished  MEWS Score/Color  MEWS Score 0  MEWS Score Color Green    "

## 2024-07-29 NOTE — Progress Notes (Signed)
 PHARMACY - ANTICOAGULATION CONSULT NOTE  Pharmacy Consult for heparin  Indication: medical management of CAD with new CP  Allergies[1]  Patient Measurements: Height: 5' 8 (172.7 cm) Weight: 81.6 kg (180 lb) IBW/kg (Calculated) : 68.4 HEPARIN  DW (KG): 81.6  Vital Signs: Temp: 98.1 F (36.7 C) (01/29 1144) Temp Source: Oral (01/29 1144) BP: 141/82 (01/29 1144) Pulse Rate: 73 (01/29 1144)  Labs: Recent Labs    07/28/24 2117 07/29/24 0245 07/29/24 1147  HGB 11.8* 10.4*  --   HCT 35.9* 30.8*  --   PLT 228 206  --   APTT 183*  --   --   LABPROT 14.1  --   --   INR 1.0  --   --   HEPARINUNFRC  --   --  0.38  CREATININE 1.05 1.01  --     Estimated Creatinine Clearance: 41.4 mL/min (by C-G formula based on SCr of 1.01 mg/dL).   Medical History: Past Medical History:  Diagnosis Date   Abnormality of gait 02/09/2013   Arthritis    Cancer (HCC)    basal cell skin cancer   Cardiomegaly    Chronic kidney disease    BPH   Chronic leg pain    Hypersomnia    Hypertension    Hypoxia    pulmonary   Neuromuscular disorder (HCC)    restless leg syndrome    Nocturia    Prostate disorder    Restless legs syndrome (RLS) 02/09/2013   Restless legs syndrome with nocturnal myoclonus    RLS (restless legs syndrome)    x 40 years   Shortness of breath    with exertion    Sleep apnea    uses cpap   Stroke [facility])    minor stroke in 2003 - no lasting side effects   UARS (upper airway resistance syndrome) 06/28/2014    Assessment: Brandon Mason is a 89 y.o. year old male admitted on 07/28/2024 with concern for NSTEMI. No anticoagulation prior to admission. NSTEMI last month with severe CAD treated medically. Pharmacy consulted to dose heparin .  Heparin  level 0.38, therapeutic No bleeding or issues with infusion noted. Per cardiology plan 48h heparin  for medical management, no intervention planned.   Goal of Therapy:  Heparin  level 0.3-0.7 units/ml Monitor platelets by  anticoagulation protocol: Yes   Plan:  Heparin  infusion at 1300 units/hr Daily heparin  level, CBC, and monitoring for bleeding F/u plans for anticoagulation and cards recs  Thank you for allowing pharmacy to participate in this patient's care.  Leonor GORMAN Bash, PharmD Emergency Medicine Clinical Pharmacist 07/29/2024,12:42 PM        [1] No Known Allergies

## 2024-07-29 NOTE — Progress Notes (Signed)
 [place] [facility] 045  [facility] Home Based Primary Care Note  This is a current Home Based Primary Care patient with [facility].  He is also a home-health patient with OT, PT and ST with [facility].  We will continue to follow for discharge disposition.  Please call with any questions or concerns.  Thank you Inocente Jacobs RN BSN [facility] Liaison  [phone]

## 2024-07-29 NOTE — Plan of Care (Signed)
   Problem: Clinical Measurements: Goal: Ability to maintain clinical measurements within normal limits will improve Outcome: Progressing   Problem: Activity: Goal: Risk for activity intolerance will decrease Outcome: Progressing   Problem: Nutrition: Goal: Adequate nutrition will be maintained Outcome: Progressing

## 2024-07-29 NOTE — Progress Notes (Signed)
 [facility] PROGRESS NOTE    Progress Note  Brandon Mason  FMW:[id number] DOB: 10-28-1925 DOA: 07/28/2024 PCP: [facility]     Brief Narrative:   Brandon Mason is an 89 y.o. male past medical history of NSTEMI treated conservatively, essential hypertension chronic respiratory failure with hypoxia on oxygen , history of severe right ICA history of GI bleed due to gastritis and esophagitis in 2023 brought into the ED after started having sudden onset of retrosternal chest pain made worse with exertion shortness of breath nausea and vomiting, twelve-lead EKG showed ischemic changes, started on IV heparin  and aspirin  and Plavix    Assessment/Plan:   Non-ST elevation MI (NSTEMI) [facility]) Cardiology was consulted recommended conservative management.  Not a candidate for PCI on admission Continue IV heparin  aspirin , Plavix , beta-blockers and statins.  Persistent cough: Chest x-ray showed chronic changes has remained afebrile. He relates his cough is mostly associated with eating.  Essential hypertension: Continue beta-blockers, hydralazine  lisinopril  and Lasix . Blood pressure is improved now.  Chronic microcytic anemia: There is a mild drop in hemoglobin from 11.8-10.4, continue to monitor CBC. He denies any melanotic stools of bright red blood per rectum.  Hypokalemia: Replete orally now improved.  Restless legs syndrome (RLS) Continue Requip .  OSA on CPAP Noted  DVT prophylaxis: heparin  Family Communication:none Status is: Inpatient Remains inpatient appropriate because: NSTEMI    Code Status:     Code Status Orders  (From admission, onward)           Start     Ordered   07/29/24 0108  Do not attempt resuscitation (DNR)- Limited -Do Not Intubate (DNI)  Continuous       Question Answer Comment  If pulseless and not breathing No CPR or chest compressions.   In Pre-Arrest Conditions (Patient Is Breathing and Has A Pulse) Do not intubate. Provide  all appropriate non-invasive medical interventions. Avoid ICU transfer unless indicated or required.   Consent: Discussion documented in EHR or advanced directives reviewed      07/29/24 0108           Code Status History     Date Active Date Inactive Code Status Order ID Comments User Context   07/28/2024 2350 07/29/2024 0108 Limited: Do not attempt resuscitation (DNR) -DNR-LIMITED -Do Not Intubate/DNI  [id number]  Franky Redia SAILOR, MD ED   06/30/2024 0114 07/03/2024 1606 Limited: Do not attempt resuscitation (DNR) -DNR-LIMITED -Do Not Intubate/DNI  [id number]  Franky Redia SAILOR, MD ED   08/27/2022 1612 09/09/2022 1858 DNR [id number]  Rosalba Glendale DEL, [name] ED   03/17/2022 0916 03/20/2022 2019 DNR [id number]  Laurence Locus, DO Inpatient   03/16/2022 2333 03/17/2022 0916 DNR [id number]  Laurence Locus, DO ED   03/29/2016 1742 03/30/2016 1349 Full Code [id number]  Gillie Duncans, MD Inpatient   03/27/2012 1745 03/28/2012 1400 Full Code [id number]  Evertt Prentice MATSU, RN Inpatient      Advance Directive Documentation    Flowsheet Row Most Recent Value  Type of Advance Directive Healthcare Power of [place], Living will, Out of facility DNR (pink MOST or yellow form)  Pre-existing out of facility DNR order (yellow form or pink MOST form) --  MOST Form in Place? --      IV Access:   Peripheral IV   Procedures and diagnostic studies:   CT RENAL STONE STUDY Result Date: 07/29/2024 EXAM: CT ABDOMEN AND PELVIS WITHOUT CONTRAST 07/29/2024 01:39:54 AM TECHNIQUE: CT of the abdomen and pelvis was performed without the administration of intravenous contrast.  Multiplanar reformatted images are provided for review. Automated exposure control, iterative reconstruction, and/or weight-based adjustment of the mA/kV was utilized to reduce the radiation dose to as low as reasonably achievable. COMPARISON: Prior examination of 09/01/2022. CLINICAL HISTORY: Abdominal/flank pain, stone suspected. Abdominal pain and flank  pain, stone suspected. FINDINGS: LOWER CHEST: Bibasilar atelectasis. Superimposed consolidation within the posterobasal left lower lobe appears stable since prior examination of 09/01/2022 and likely represents chronic consolidation / fibrosis. Trace bilateral pleural effusions. Cardiomegaly. Extensive coronary artery calcifications. LIVER: The liver is unremarkable. GALLBLADDER AND BILE DUCTS: Gallbladder is unremarkable. No biliary ductal dilatation. SPLEEN: No acute abnormality. PANCREAS: No acute abnormality. ADRENAL GLANDS: No acute abnormality. KIDNEYS, URETERS AND BLADDER: Multiple hyperdense and simple cortical cysts are seen within the left kidney for which no follow up imaging is recommended. 1 mm punctate nonobstructing calculus within the lower pole of the left kidney. No ureteral calculi. No hydronephrosis. No perinephric inflammatory stranding or fluid collections were identified. The bladder is unremarkable. GI AND BOWEL: Mild pan colonic diverticulosis without superimposed acute inflammatory change. Appendix normal. The stomach, small bowel, and large bowel are otherwise unremarkable. There is no bowel obstruction. PERITONEUM AND RETROPERITONEUM: No ascites. No free air. VASCULATURE: Aorta is normal in caliber. Extensive aortoiliac atherosclerotic calcification. LYMPH NODES: No lymphadenopathy. REPRODUCTIVE ORGANS: Moderate prostatic hypertrophy. Post transurethral resection defect within the central prostate gland. BONES AND SOFT TISSUES: Osseous structures are diffusely osteopenic. Advanced degenerative changes are seen within the lumbar spine. No focal soft tissue abnormality. IMPRESSION: 1. No acute findings in the abdomen or pelvis. 2. Punctate 1 mm nonobstructing left lower pole renal calculus, without hydronephrosis or ureteral calculus. 3. Stable chronic posterobasal left lower lobe consolidation/fibrosis with bibasilar atelectasis and trace bilateral pleural effusions. 4. Cardiomegaly with  extensive coronary artery calcifications and extensive aortoiliac atherosclerotic calcification. 5. Mild pancolonic diverticulosis, 6. Moderate prostatomegaly with post transurethral resection defect Electronically signed by: Dorethia Molt MD 07/29/2024 01:52 AM EST RP Workstation: [id number]   DG Chest Port 1 View Result Date: 07/28/2024 EXAM: 1 VIEW(S) XRAY OF THE CHEST 07/28/2024 09:14:00 PM COMPARISON: 06/29/2024 CLINICAL HISTORY: Chills, cough, pleuritic chest pain, and shortness of breath. History of clots and recent non-ST elevation myocardial infarction. FINDINGS: LUNGS AND PLEURA: Persistent low lung volumes with patchy bibasilar opacities, similar to recent radiographs. Mild pulmonary edema. Trace pleural effusions. No pneumothorax. HEART AND MEDIASTINUM: Cardiomegaly. Stable mediastinal contours with aortic atherosclerosis. BONES AND SOFT TISSUES: Degenerative changes in spine and shoulders. IMPRESSION: 1. Persistent low lung volumes with patchy bibasilar opacities, similar to recent radiographs. 2. Mild pulmonary edema with trace pleural effusions. 3. Cardiomegaly. Electronically signed by: Oneil Devonshire MD 07/28/2024 09:18 PM EST RP Workstation: [id number]     Medical Consultants:   None.   Subjective:    Brandon Mason chest pain-free shortness of breath free.  Objective:    Vitals:   07/29/24 0245 07/29/24 0300 07/29/24 0315 07/29/24 0330  BP: (!) 155/66 127/86 139/70 132/76  Pulse: 87 85 80 83  Resp: 14 16 (!) 22 (!) 21  Temp:      SpO2: 99% 98% 97% 98%  Weight:      Height:       SpO2: 98 %  No intake or output data in the 24 hours ending 07/29/24 0615 Filed Weights   07/28/24 2122  Weight: 81.6 kg    Exam: General exam: In no acute distress. Respiratory system: Good air movement and clear to auscultation. Cardiovascular system: S1 & S2 heard, RRR. No  JVD. Gastrointestinal system: Abdomen is nondistended, soft and nontender.  Skin: No rashes, lesions or  ulcers Psychiatry: Judgement and insight appear normal. Mood & affect appropriate.    Data Reviewed:    Labs: Basic Metabolic Panel: Recent Labs  Lab 07/28/24 2117 07/29/24 0245  NA 141 140  K 3.3* 3.7  CL 102 105  CO2 24 25  GLUCOSE 118* 114*  BUN 26* 24*  CREATININE 1.05 1.01  CALCIUM  9.1 8.7*   GFR Estimated Creatinine Clearance: 41.4 mL/min (by C-G formula based on SCr of 1.01 mg/dL). Liver Function Tests: Recent Labs  Lab 07/28/24 2117 07/29/24 0245  AST 23 16  ALT 11 8  ALKPHOS 60 51  BILITOT 0.8 0.6  PROT 7.2 6.2*  ALBUMIN 3.9 3.4*   Recent Labs  Lab 07/28/24 2117  LIPASE 17   No results for input(s): AMMONIA in the last 168 hours. Coagulation profile Recent Labs  Lab 07/28/24 2117  INR 1.0   COVID-19 Labs  No results for input(s): DDIMER, FERRITIN, LDH, CRP in the last 72 hours.  Lab Results  Component Value Date   SARSCOV2NAA NEGATIVE 07/28/2024   SARSCOV2NAA NEGATIVE 03/16/2022    CBC: Recent Labs  Lab 07/28/24 2117 07/29/24 0245  WBC 7.5 7.1  NEUTROABS 4.3  --   HGB 11.8* 10.4*  HCT 35.9* 30.8*  MCV 101.1* 99.4  PLT 228 206   Cardiac Enzymes: No results for input(s): CKTOTAL, CKMB, CKMBINDEX, TROPONINI in the last 168 hours. BNP (last 3 results) Recent Labs    06/29/24 2102 07/28/24 2117  PROBNP 2,135.0* 1,226.0*   CBG: No results for input(s): GLUCAP in the last 168 hours. D-Dimer: No results for input(s): DDIMER in the last 72 hours. Hgb A1c: Recent Labs    07/28/24 2117  HGBA1C 5.9*   Lipid Profile: Recent Labs    07/28/24 2117  CHOL 105  HDL 51  LDLCALC 40  TRIG 68  CHOLHDL 2.1   Thyroid  function studies: No results for input(s): TSH, T4TOTAL, T3FREE, THYROIDAB in the last 72 hours.  Invalid input(s): FREET3 Anemia work up: No results for input(s): VITAMINB12, FOLATE, FERRITIN, TIBC, IRON, RETICCTPCT in the last 72 hours. Sepsis Labs: Recent Labs  Lab  07/28/24 2117 07/28/24 2128 07/29/24 0245  WBC 7.5  --  7.1  LATICACIDVEN  --  2.4*  --    Microbiology Recent Results (from the past 240 hours)  Resp panel by RT-PCR (RSV, Flu A&B, Covid) Anterior Nasal Swab     Status: None   Collection Time: 07/28/24 10:38 PM   Specimen: Anterior Nasal Swab  Result Value Ref Range Status   SARS Coronavirus 2 by RT PCR NEGATIVE NEGATIVE Final   Influenza A by PCR NEGATIVE NEGATIVE Final   Influenza B by PCR NEGATIVE NEGATIVE Final    Comment: (NOTE) The Xpert Xpress SARS-CoV-2/FLU/RSV plus assay is intended as an aid in the diagnosis of influenza from Nasopharyngeal swab specimens and should not be used as a sole basis for treatment. Nasal washings and aspirates are unacceptable for Xpert Xpress SARS-CoV-2/FLU/RSV testing.  Fact Sheet for Patients: bloggercourse.com  Fact Sheet for Healthcare Providers: seriousbroker.it  This test is not yet approved or cleared by the [place]  FDA and has been authorized for detection and/or diagnosis of SARS-CoV-2 by FDA under an Emergency Use Authorization (EUA). This EUA will remain in effect (meaning this test can be used) for the duration of the COVID-19 declaration under Section 564(b)(1) of the Act, 21 U.S.C. section 360bbb-3(b)(1),  unless the authorization is terminated or revoked.     Resp Syncytial Virus by PCR NEGATIVE NEGATIVE Final    Comment: (NOTE) Fact Sheet for Patients: bloggercourse.com  Fact Sheet for Healthcare Providers: seriousbroker.it  This test is not yet approved or cleared by the [place]  FDA and has been authorized for detection and/or diagnosis of SARS-CoV-2 by FDA under an Emergency Use Authorization (EUA). This EUA will remain in effect (meaning this test can be used) for the duration of the COVID-19 declaration under Section 564(b)(1) of the Act, 21  U.S.C. section 360bbb-3(b)(1), unless the authorization is terminated or revoked.  Performed at [facility], [address] [place], [place] [address]      Medications:    aspirin  EC  81 mg Oral Daily   atorvastatin   80 mg Oral Daily   clopidogrel   75 mg Oral Daily   finasteride   5 mg Oral Daily   [START ON 07/30/2024] furosemide   20 mg Oral QODAY   hydrALAZINE   50 mg Oral BID   levothyroxine   25 mcg Oral Daily   lisinopril   20 mg Oral Daily   metoprolol  tartrate  12.5 mg Oral BID   pantoprazole   40 mg Oral Q0600   pregabalin   150 mg Oral BID   rOPINIRole   2 mg Oral BID   rOPINIRole   3 mg Oral QHS   traMADol   50 mg Oral QID   Continuous Infusions:  sodium chloride  10 mL/hr at 07/28/24 2114   heparin  1,300 Units/hr (07/29/24 0339)      LOS: 1 day   Erle Odell Castor  [facility]  07/29/2024, 6:15 AM

## 2024-07-29 NOTE — Consult Note (Signed)
 "                                                  Palliative Care Consult Note                                  Date: 07/29/2024   Patient Name: Brandon Mason  DOB:[birth year]  FMW:[id number]  Age / Sex:89 y.o., male  PCP: [facility] Referring Physician: Odell Mason, Erle, MD  Reason for Consultation: Establishing goals of care  Past Medical History:  Diagnosis Date   Abnormality of gait 02/09/2013   Arthritis    Cancer (HCC)    basal cell skin cancer   Cardiomegaly    Chronic kidney disease    BPH   Chronic leg pain    Hypersomnia    Hypertension    Hypoxia    pulmonary   Neuromuscular disorder (HCC)    restless leg syndrome    Nocturia    Prostate disorder    Restless legs syndrome (RLS) 02/09/2013   Restless legs syndrome with nocturnal myoclonus    RLS (restless legs syndrome)    x 40 years   Shortness of breath    with exertion    Sleep apnea    uses cpap   Stroke [name])    minor stroke in 2003 - no lasting side effects   UARS (upper airway resistance syndrome) 06/28/2014     Assessment & Plan:   HPI/Patient Profile: 89 y.o. male  with past medical history of HTN, NSTEMI (05/2024), OSA on CPAP, hypothyroidism admitted on 07/28/2024 with NSTEMI. Per H&P on 07/28/2024 by Franky MD patient presented with shortness of breath, cough, and retrosternal nonradiating chest pain. Of note patient was admitted 12/30-1/3 for NSTEMI with medical management after LHC (12/31) demonstrated LAD D1 bifurcation disease and patient deemed not a good candidate for PCI. Patient initiated on heparin  infusion for NSTEMI.   Palliative medicine consulted for goals of care conversation.   SUMMARY OF RECOMMENDATIONS   DNR-limited, open to most medical interventions to prolong and promote quality of life, but would not want overly aggressive and invasive interventions Continue current management with hopes to return home with home health services including  PT/OT/SLP Palliative medicine will continue to follow  Symptom Management:  Per primary team  Code Status: DNR - Limited (DNR/DNI)  Prognosis:  Unable to determine  Discharge Planning:  Home with Home Health   Discussed with: Brandon Castor MD 07/29/2024 about patient and family's desire to continue current management and open to all available and offered medical interventions to prolong life. Discussed with [facility] liaison Brandon Mason who provides patient's home based primary care on Epic Chat about family's request for hospital bed, simple oxygen  face mask, nebulizer.   Subjective:   Reviewed medical records, received report from team, assessed the patient and then meet at the patient's bedside to discuss diagnosis, prognosis, GOC, EOL wishes disposition and options.  I met with patient and daughter Brandon Mason) at bedside.   We meet to discuss diagnosis prognosis, GOC, EOL wishes, disposition and options. Concept of Palliative Care was introduced as specialized medical care for people and their families living with serious illness.  If focuses on providing relief from the symptoms and stress of a serious illness.  The  goal is to improve quality of life for both the patient and the family. Values and goals of care important to patient and family were attempted to be elicited.  Created space and opportunity for patient  and family to explore thoughts and feelings regarding current medical situation   Natural trajectory and current clinical status were discussed. Questions and concerns addressed. Patient encouraged to call with questions or concerns.    Patient/Family Understanding of Illness: - Patient and family have a good understanding of the patient's overall health and reasoning for current admission which is most likely an NSTEMI and that the patient is receiving heparin  infusion at this time  Baseline Status: - Patient lives at home with his daughter Brandon Mason) and grandson, someone is with  him usually and he is never usually alone - Patient remains pretty independent with his ADLs, does need modified utensil for eating to assist to compensate for his dexterity and neuropathy - Patient receives home based primary care through [facility] and receives PT/OT and SLP therapy services at home, daughter shares that they are very happy with their services and a NP visits the patient for home care  Today's Discussion: - Reviewed with the patient and daughter about function and quality of life prior to admission, current interventions so far during this admission, and discussed next steps to consider prior to discharge - Brandon Mason shares that Brandon Mason despite his advanced age remains very active, highly independent, and functional, he is determined to continue to have as good of a quality of life as possible and adamant about continue to maintain his independence, he is currently working on planning a [employer] party for his friends and family, emphasizing that he continues to live a very high quality of life - Brandon Mason also shared that prior to this current admission the patient had not been wearing his oxygen  for most of the day and wonders if this contributed to his confusion and chest pain requiring him to be admitted, shared with her that certainly it could have contributed - Brandon Mason also inquired about possible management to avoid future events like this happening again and inquired about a simple oxygen  facemask for times when patient falls asleep during the day and he mouth breathes making his nasal cannula not as effective, shared that certainly a facemask could help and that I will communicate with his home health agency to consider providing it for him - Daughter also inquired about possibly having a nebulizer in addition to his albuterol  inhaler as she is concerned that his dexterity has been deteriorating and might not longer able to adequately use his inhaler as he has in the past, shared that she has  modified his eating utensils so that he can grasp onto them better, shared with her that I would communicate her request with the patient's home health services to consider a nebulizer - Daughter mentioned that since this is the second time the patient is being admitted for a heart related issue, she wonders if the aspirin  and plavix  is at an adequate dosage, reviewed with her that his current regimen is adequate would most not likely need to be adjusted to prevent future events from happening - Daughter inquired about the patient undergoing a CABG and reviewed with her certainly it would not be the recommendation of the medical team to pursue such an invasive and intensive surgery at this stage, daughter herself aware that even if the patient survived the surgery, the recovery process would be very difficult and patient  might not be able to fully recover - Shared with daughter that the patient might not even be able to handle the anesthesia to undergo surgery let alone the long recovery process which a lot of time in elderly patients may result in further complications such as pneumonia, further infections, poor quality of life and intense pain making the risks outweigh the benefit of a CABG - Daughter inquired about having a hospital bed as the patient's current bed is high and it is now getting more difficult for him to get in and out, again shared with her that I could communicate with home health services to consider providing a hospital bed - Inquired about the patient's code status and confirmed with daughter and patient that he is to remain DNR-limited and would not want to be placed on a ventilator - Inquired about the patient's living will and HCPOA documentation, daughter shares that patient and family has had extensive conversations and he remains open to all continued medical interventions in order to get better so that he can continue to have a good quality of life at home, but knows they would  not want to pursue aggressive interventions if it would cause more harm than benefit - Reviewed with patient and family that their current goals to continue current medical interventions makes sense as patient is still able to live very well, but reviewed that there may come a time when the patient may be spending more time at the hospital for frequent treatments and maybe at that time it may make more sense for them to consider transitioning to hospice when they are no longer able to achieve an acceptable quality of life  Goals: - Patient and family would like to continue current measures with the goal of going home as soon as the patient is medically stable to discharge  Review of Systems  All other systems reviewed and are negative.   Objective:   Primary Diagnoses: Present on Admission:  Non-ST elevation MI (NSTEMI) (HCC)  Restless legs syndrome (RLS)  Hypothyroidism  Essential hypertension  Macrocytic anemia  BPH (benign prostatic hyperplasia)   Vital Signs:  BP (!) 177/96 (BP Location: Right Arm)   Pulse 70   Temp 97.9 F (36.6 C) (Oral)   Resp 18   Ht 5' 8 (1.727 m)   Wt 81.6 kg   SpO2 97%   BMI 27.37 kg/m   Physical Exam HENT:     Head: Normocephalic and atraumatic.     Right Ear: Decreased hearing noted.     Left Ear: Decreased hearing noted.     Nose: Nose normal.     Mouth/Throat:     Mouth: Mucous membranes are dry.  Eyes:     Extraocular Movements: Extraocular movements intact.  Cardiovascular:     Rate and Rhythm: Normal rate.  Pulmonary:     Effort: Pulmonary effort is normal.     Comments: On nasal cannula Skin:    General: Skin is warm and dry.  Neurological:     Mental Status: He is alert. Mental status is at baseline.  Psychiatric:        Mood and Affect: Mood normal.    Palliative Assessment/Data: 60%   Thank you for allowing us  to participate in the care of [name] PMT will continue to support holistically.  I personally  spent a total of 75 minutes in the care of the patient today including preparing to see the patient, getting/reviewing separately obtained history, performing a  medically appropriate exam/evaluation, counseling and educating, placing orders, referring and communicating with other health care professionals, and documenting clinical information in the EHR.  Signed by: Fairy FORBES Shan DEVONNA Palliative Medicine Team  Team Phone # [phone] (Nights/Weekends)  07/29/2024, 10:11 AM   "

## 2024-07-29 NOTE — ED Notes (Signed)
 Palliative care at bedside.

## 2024-07-29 NOTE — Progress Notes (Addendum)
 "  Progress Note  Patient Name: Brandon Mason Date of Encounter: 07/29/2024 [facility] Cardiologist: Jerel Balding, MD   Interval Summary   Reported shortness of breath has improved, no chest pain.  Had a cold last week.  Vital Signs Vitals:   07/29/24 0315 07/29/24 0330 07/29/24 0400 07/29/24 0731  BP: 139/70 132/76 (!) 171/77 (!) 177/96  Pulse: 80 83 76 70  Resp: (!) 22 (!) 21 15 18   Temp:    97.9 F (36.6 C)  TempSrc:    Oral  SpO2: 97% 98% 97% 97%  Weight:      Height:       No intake or output data in the 24 hours ending 07/29/24 0807    07/28/2024    9:22 PM 07/21/2024    3:01 PM 06/29/2024   12:12 PM  Last 3 Weights  Weight (lbs) 180 lb 186 lb 180 lb  Weight (kg) 81.647 kg 84.369 kg 81.647 kg      Telemetry/ECG  NSR with RBBB HR ~75 - Personally Reviewed  Physical Exam  GEN: No acute distress.   Neck: mild JVD Cardiac: RRR, no murmurs, rubs, or gallops. DP 2+ Respiratory: Diminished at bases GI: Soft, nontender, non-distended  MS: No edema  LHC 06/30/24     Assessment & Plan  Brandon Mason is a 89 y.o. male with a hx of CAD s/p NSTEMI with LHC showing complex bifurcation disease treated medically in 07/2024 , hypertension, hyperlipidemia, TDM-II, obstructive sleep apnea, CVA, moderate bilateral carotid artery stenosis, 75% right subclavian artery stenosis, prior DVT, history of herniated cervical disc, prior GIB, OSA on CPAP who presented to the ED for acute shortness of breath. Patient is on home oxygen  5L. Did have an episode of emesis en route via EMS. In the ED, ECG showed sinus rhythm with RBBB and ST changes concerning for inferior STEMI. Dr. Jordan was notified who felt ECG did not meet criteria and cath lab was deactivated. Upon my review ECG appears similar to Dec 2025 ECG. Troponin 45 -> 46. ProBNP 1226. Lactic acid 2.4. CXR showed mild volume overload  Chest pain CAD  LHC 06/30/24 showed complex LAD d1 bifurcation diease as depicted  above. Medical management was recommended. Patient representing with chest pain suspected to be coronary in etiology, however given recent cath results will continue medical management.  Patient is currently chest pain free. Improved ON after fentanyl  administration.  Consider repeating echocardiogram.   Continue IV heparin  Continue ASA 81  Continue Plavix  75 mg Continue Lopressor  12.5 mg BID  Hypertension BP: 177/96 Continue Lisinopril  20 mg Continue Hydralazine  50 mg BID Give lasix  20 mg today, then continue Lasix  20mg  every other day Lopressor  as above  Hyperlipidemia Continue Lipitor 80  Carotid artery stenosis Seen by vascular surgery, pursing medical management Continue to follow with vascular surgery outpatient Medications as above   Lactic acidosis  2.4 on admission, will order repeat now that patient received IV fluids and doing better.   Per primary Infectious symptoms Anemia RLS OSA on CPAP Electrolyte disturbances  BPH Hypothyroidism GERD   For questions or updates, please contact [facility] Please consult [email or web address] for contact info under       Signed, Leontine LOISE Salen, [name]   Patient seen and examined, note reviewed with the signed Advanced Practice Provider. I personally reviewed laboratory data, imaging studies and relevant notes. I independently examined the patient and formulated the important aspects of the plan. I have personally discussed  the plan with the patient and/or family. Comments or changes to the note/plan are indicated below.  Patient seen examined his bedside.  CAD with recent chest pain, NSTEMI suspect this is type II MI Hypertension Hyperlipidemia Coronary artery stenosis Noted lactic acidosis OSA on CPAP Electrolyte disturbance Anemia  His NSTEMI and suspect is type II mismatch in the setting of his acute illness.  He has been started on IV heparin  please continue this with his aspirin  and Plavix .  He can  complete 48 hours of his heparin  and then discontinue for his medical management.  He is not a candidate for intervention at this time I do not believe it will be of any benefit to pursue any aggressive interventions in terms of PCI etc.  He will build benefit from a dose of diuretics today.  Will continue to monitor. Blood pressure is elevated his home medications has been restarted we will monitor this hopefully blood pressure improves and we can make adjustments as appropriate.  As noted above okay with starting back his Lasix  given his 20 mg a day and then we can monitor his urine output, back to home dose of Lasix  20 mg every other day.  No need for any repeat echocardiogram at this time.  Will continue to follow with you.   Kashawn Dirr DO, MS [facility] Attending Cardiologist [facility]  [address] [place], [place] [address] [phone] Website: [email or web address]  "

## 2024-07-29 NOTE — Progress Notes (Unsigned)
 "  Patient name: Brandon Mason MRN: [id number] DOB: 1925-07-21 Sex: male  HPI: Brandon Mason is a 89 y.o. male, with a several year history of leg swelling dating back at least to 2013.  He did have a DVT in 2013 and was on Eliquis  for about a year.  He has had chronic swelling in both legs from the knee down to the ankle bilaterally.  He occasionally gets some pedal and ankle edema.  He has never really had any skin breakdown.  He also has a history of restless leg syndrome and bilateral peripheral neuropathy.  Other medical problems include hypertension, arthritis, both of which are stable.  Past Medical History:  Diagnosis Date   Abnormality of gait 02/09/2013   Arthritis    Cancer (HCC)    basal cell skin cancer   Cardiomegaly    Chronic kidney disease    BPH   Chronic leg pain    Hypersomnia    Hypertension    Hypoxia    pulmonary   Neuromuscular disorder (HCC)    restless leg syndrome    Nocturia    Prostate disorder    Restless legs syndrome (RLS) 02/09/2013   Restless legs syndrome with nocturnal myoclonus    RLS (restless legs syndrome)    x 40 years   Shortness of breath    with exertion    Sleep apnea    uses cpap   Stroke (HCC)    minor stroke in 2003 - no lasting side effects   UARS (upper airway resistance syndrome) 06/28/2014   Past Surgical History:  Procedure Laterality Date   ANTERIOR CERVICAL DECOMP/DISCECTOMY FUSION N/A 03/29/2016   Procedure: ANTERIOR CERVICAL DECOMPRESSION/DISCECTOMY FUSION CERVICAL THREE- CERVICAL FOUR;  Surgeon: Rockey Peru, MD;  Location: [facility];  Service: Neurosurgery;  Laterality: N/A;   BACK SURGERY  2003   L4-5   CATARACT EXTRACTION Bilateral    ESOPHAGOGASTRODUODENOSCOPY (EGD) WITH PROPOFOL  N/A 03/17/2022   Procedure: ESOPHAGOGASTRODUODENOSCOPY (EGD) WITH PROPOFOL ;  Surgeon: Federico Rosario BROCKS, MD;  Location: [facility];  Service: Gastroenterology;  Laterality: N/A;   HERNIA REPAIR     inguinal hernia   LEFT HEART CATH  AND CORONARY ANGIOGRAPHY N/A 06/30/2024   Procedure: LEFT HEART CATH AND CORONARY ANGIOGRAPHY;  Surgeon: Ladona Heinz, MD;  Location: [facility] INVASIVE CV LAB;  Service: Cardiovascular;  Laterality: N/A;   TRANSURETHRAL PROSTATECTOMY WITH GYRUS INSTRUMENTS  04/28/2012   Procedure: TRANSURETHRAL PROSTATECTOMY WITH GYRUS INSTRUMENTS;  Surgeon: Donnice Gwenyth Brooks, MD;  Location: [facility] OR[facility];  Service: Urology;  Laterality: N/A;   TRANSURETHRAL RESECTION OF PROSTATE  03/24/2012   Procedure: TRANSURETHRAL RESECTION OF THE PROSTATE (TURP);  Surgeon: Donnice Gwenyth Brooks, MD;  Location: [facility];  Service: Urology;  Laterality: N/A;  with gyrus ,Greenlight PVP laser of Prostate    Family History  Problem Relation Age of Onset   Restless legs syndrome Mother    Restless legs syndrome Daughter    Esophageal cancer Neg Hx    Colon cancer Neg Hx    Pancreatic cancer Neg Hx    Stomach cancer Neg Hx     SOCIAL HISTORY: Social History   Socioeconomic History   Marital status: Married    Spouse name: Ann   Number of children: 5   Years of education: 12   Highest education level: Not on file  Occupational History   Occupation: retired     Comment: Production Designer, Theatre/television/film for a museum/gallery curator  Tobacco Use   Smoking status: Never  Smokeless tobacco: Never  Substance and Sexual Activity   Alcohol use: Yes    Alcohol/week: 2.0 standard drinks of alcohol    Types: 2 Cans of beer per week    Comment: weekly   Drug use: No   Sexual activity: Not on file  Other Topics Concern   Not on file  Social History Narrative   Patient lives at home with his wife Jenkins.   Patient is retired.    Patient has 5 children.   Patient has a high school education.   Patient is right-handed.   Patient drinks very little caffeine.   Social Drivers of Health   Tobacco Use: Low Risk (07/29/2024)   Patient History    Smoking Tobacco Use: Never    Smokeless Tobacco Use: Never    Passive Exposure: Not on file  Financial Resource  Strain: Low Risk (04/13/2024)   Received from [facility]   Overall Financial Resource Strain (CARDIA)    How hard is it for you to pay for the very basics like food, housing, medical care, and heating?: Not hard at all  Food Insecurity: No Food Insecurity (06/30/2024)   Epic    Worried About Programme Researcher, Broadcasting/film/video in the Last Year: Never true    Ran Out of Food in the Last Year: Never true  Transportation Needs: No Transportation Needs (06/30/2024)   Epic    Lack of Transportation (Medical): No    Lack of Transportation (Non-Medical): No  Physical Activity: Inactive (04/13/2024)   Received from [facility]   Exercise Vital Sign    On average, how many days per week do you engage in moderate to strenuous exercise (like a brisk walk)?: 0 days    Minutes of Exercise per Session: Not on file  Stress: No Stress Concern Present (04/13/2024)   Received from [facility] - Occupational Stress Questionnaire    Do you feel stress - tense, restless, nervous, or anxious, or unable to sleep at night because your mind is troubled all the time - these days?: Only a little  Social Connections: Moderately Integrated (06/30/2024)   Social Connection and Isolation Panel    Frequency of Communication with Friends and Family: Three times a week    Frequency of Social Gatherings with Friends and Family: Three times a week    Attends Religious Services: 1 to 4 times per year    Active Member of Clubs or Organizations: Yes    Attends Banker Meetings: More than 4 times per year    Marital Status: Widowed  Intimate Partner Violence: Not At Risk (06/30/2024)   Epic    Fear of Current or Ex-Partner: No    Emotionally Abused: No    Physically Abused: No    Sexually Abused: No  Depression (PHQ2-9): Not on file  Alcohol Screen: Not on file  Housing: Low Risk (06/30/2024)   Epic    Unable to Pay for Housing in the Last Year: No    Number of Times  Moved in the Last Year: 0    Homeless in the Last Year: No  Utilities: Not At Risk (06/30/2024)   Epic    Threatened with loss of utilities: No  Health Literacy: Not on file    No Known Allergies  No current facility-administered medications for this visit.   No current outpatient medications on file.   Facility-Administered Medications Ordered in Other Visits  Medication Dose Route Frequency Provider Last  Rate Last Admin   0.9 %  sodium chloride  infusion   Intravenous Continuous Tegeler, Lonni PARAS, MD 10 mL/hr at 07/28/24 2114 New Bag at 07/28/24 2114   aspirin  EC tablet 81 mg  81 mg Oral Daily Franky Redia SAILOR, MD   81 mg at 07/29/24 1032   atorvastatin  (LIPITOR) tablet 80 mg  80 mg Oral Daily Kakrakandy, Arshad N, MD   80 mg at 07/29/24 1033   clopidogrel  (PLAVIX ) tablet 75 mg  75 mg Oral Daily Kakrakandy, Arshad N, MD   75 mg at 07/29/24 1032   finasteride  (PROSCAR ) tablet 5 mg  5 mg Oral Daily Kakrakandy, Arshad N, MD   5 mg at 07/29/24 1039   [START ON 07/30/2024] furosemide  (LASIX ) tablet 20 mg  20 mg Oral QODAY Kakrakandy, Arshad N, MD       heparin  ADULT infusion 100 units/mL (25000 units/250mL)  1,300 Units/hr Intravenous Continuous Mattie Marvetta SQUIBB, RPH 13 mL/hr at 07/29/24 0339 1,300 Units/hr at 07/29/24 9660   hydrALAZINE  (APRESOLINE ) tablet 50 mg  50 mg Oral BID Kakrakandy, Arshad N, MD   50 mg at 07/29/24 1032   levothyroxine  (SYNTHROID ) tablet 25 mcg  25 mcg Oral Daily Kakrakandy, Arshad N, MD   25 mcg at 07/29/24 1033   lisinopril  (ZESTRIL ) tablet 20 mg  20 mg Oral Daily Franky Redia SAILOR, MD   20 mg at 07/29/24 1033   metoprolol  tartrate (LOPRESSOR ) tablet 12.5 mg  12.5 mg Oral BID Kakrakandy, Arshad N, MD   12.5 mg at 07/29/24 1031   nitroGLYCERIN  (NITROSTAT ) SL tablet 0.4 mg  0.4 mg Sublingual Q5 min PRN Franky Redia SAILOR, MD       ondansetron  (ZOFRAN ) injection 4 mg  4 mg Intravenous Q6H PRN Odell Celinda Balo, MD   4 mg at 07/29/24 0813   pantoprazole   (PROTONIX ) EC tablet 40 mg  40 mg Oral Q0600 Kakrakandy, Arshad N, MD   40 mg at 07/29/24 1033   polyethylene glycol (MIRALAX  / GLYCOLAX ) packet 17 g  17 g Oral Daily PRN Franky Redia SAILOR, MD       pregabalin  (LYRICA ) capsule 150 mg  150 mg Oral BID Franky Redia SAILOR, MD   150 mg at 07/29/24 1038   rOPINIRole  (REQUIP ) tablet 2 mg  2 mg Oral BID Franky Redia SAILOR, MD       rOPINIRole  (REQUIP ) tablet 3 mg  3 mg Oral QHS Kakrakandy, Arshad N, MD   3 mg at 07/29/24 9674   traMADol  (ULTRAM ) tablet 50 mg  50 mg Oral QID Franky Redia SAILOR, MD   50 mg at 07/29/24 1032    ROS:   General:  No weight loss, Fever, chills  HEENT: No recent headaches, no nasal bleeding, no visual changes, no sore throat  Neurologic: No dizziness, blackouts, seizures. No recent symptoms of stroke or mini- stroke. No recent episodes of slurred speech, or temporary blindness.  Cardiac: No recent episodes of chest pain/pressure, no shortness of breath at rest.  No shortness of breath with exertion.  Denies history of atrial fibrillation or irregular heartbeat  Vascular: No history of rest pain in feet.  No history of claudication.  No history of non-healing ulcer, No history of DVT   Pulmonary: No home oxygen , no productive cough, no hemoptysis,  No asthma or wheezing  Musculoskeletal:  [X]  Arthritis, [ ]  Low back pain,  [X]  Joint pain  Hematologic:No history of hypercoagulable state.  No history of easy bleeding.  No history of  anemia  Gastrointestinal: No hematochezia or melena,  No gastroesophageal reflux, no trouble swallowing  Urinary: [ ]  chronic Kidney disease, [ ]  on HD - [ ]  MWF or [ ]  TTHS, [ ]  Burning with urination, [ ]  Frequent urination, [ ]  Difficulty urinating;   Skin: No rashes  Psychological: No history of anxiety,  No history of depression   Physical Examination   There were no vitals filed for this visit.   General:  Alert and oriented, no acute distress HEENT: Normal Neck:  No JVD Cardiac: Regular Rate and Rhythm Skin: No rash, healing area of dry eschar left pretibial region from previous skin biopsy a couple of months ago Extremity Pulses:  2+ radial, brachial, femoral, absent dorsalis pedis, posterior tibial pulses bilaterally Musculoskeletal: Diffuse 2+ edema from the knee to the ankle bilaterally.  This does not involve the foot.  Occasionally he states he will get some pedal and ankle edema. Neurologic: Upper and lower extremity motor 5/5 and symmetric  DATA:  Patient had a venous duplex today for reflux.  He did have reflux at the saphenofemoral junction bilaterally.  Right greater saphenous vein was 4 mm.  Left greater saphenous vein was 4 mm.  There was not much reflux below the level of the junction.  He had an echocardiogram performed in March 2021 which is essentially normal.  He did have dilation of the ascending aorta at 4.2 cm.  He did have an element of deep vein reflux bilaterally on his duplex today as well.  He has had numerous DVT ultrasounds in the past dating back to 2017 all of which have been negative for DVT  ASSESSMENT: Chronic lower extremity edema from combination of superficial and deep venous reflux.  This does not really appear to be lymphedema.  He also has a history of restless legs and peripheral neuropathy and this may also be contributing to his symptoms.  I would not consider him for a vein intervention.  The patient also in light of his age has not interested in any interventions.   PLAN: Patient was fitted today for bilateral lower extremity compression stockings 20 to 30 mmHg he will try to wear these as much as possible during the day to control swelling symptoms and elevate his leg to his legs when possible.  I also did discuss with him the fact that he had had a carotid duplex several years ago which showed moderate carotid stenosis.  He remains asymptomatic from this.  He is also not interested in any carotid  interventions.  We will not scan this any further.  He is on amlodipine  for blood pressure control and this is known to cause leg edema in some cases as well.  I will leave this at the discretion of his primary care provider whether or not to change his blood pressure medications.  Patient will follow up on an as-needed basis.   Carlin Haddock, MD [facility] Office: [phone]   "

## 2024-07-30 DIAGNOSIS — N179 Acute kidney failure, unspecified: Secondary | ICD-10-CM | POA: Diagnosis not present

## 2024-07-30 DIAGNOSIS — R079 Chest pain, unspecified: Secondary | ICD-10-CM | POA: Diagnosis not present

## 2024-07-30 DIAGNOSIS — I5023 Acute on chronic systolic (congestive) heart failure: Secondary | ICD-10-CM | POA: Diagnosis not present

## 2024-07-30 DIAGNOSIS — I214 Non-ST elevation (NSTEMI) myocardial infarction: Secondary | ICD-10-CM | POA: Diagnosis not present

## 2024-07-30 LAB — BASIC METABOLIC PANEL WITH GFR
Anion gap: 8 (ref 5–15)
BUN: 31 mg/dL — ABNORMAL HIGH (ref 8–23)
CO2: 28 mmol/L (ref 22–32)
Calcium: 8.6 mg/dL — ABNORMAL LOW (ref 8.9–10.3)
Chloride: 103 mmol/L (ref 98–111)
Creatinine, Ser: 1.43 mg/dL — ABNORMAL HIGH (ref 0.61–1.24)
GFR, Estimated: 45 mL/min — ABNORMAL LOW
Glucose, Bld: 100 mg/dL — ABNORMAL HIGH (ref 70–99)
Potassium: 4 mmol/L (ref 3.5–5.1)
Sodium: 139 mmol/L (ref 135–145)

## 2024-07-30 LAB — CBC
HCT: 30.4 % — ABNORMAL LOW (ref 39.0–52.0)
Hemoglobin: 10 g/dL — ABNORMAL LOW (ref 13.0–17.0)
MCH: 33.1 pg (ref 26.0–34.0)
MCHC: 32.9 g/dL (ref 30.0–36.0)
MCV: 100.7 fL — ABNORMAL HIGH (ref 80.0–100.0)
Platelets: 201 10*3/uL (ref 150–400)
RBC: 3.02 MIL/uL — ABNORMAL LOW (ref 4.22–5.81)
RDW: 13.9 % (ref 11.5–15.5)
WBC: 6.3 10*3/uL (ref 4.0–10.5)
nRBC: 0 % (ref 0.0–0.2)

## 2024-07-30 LAB — HEPARIN LEVEL (UNFRACTIONATED): Heparin Unfractionated: 0.54 [IU]/mL (ref 0.30–0.70)

## 2024-07-30 MED ORDER — NITROGLYCERIN 0.4 MG SL SUBL: 0.4000 mg | SUBLINGUAL_TABLET | SUBLINGUAL | Status: AC | PRN

## 2024-07-30 MED ORDER — LISINOPRIL 20 MG PO TABS
20.0000 mg | ORAL_TABLET | Freq: Every day | ORAL | Status: DC
  Administered 2024-07-31 – 2024-08-01 (×2): 20 mg via ORAL
  Filled 2024-07-30 (×3): qty 1

## 2024-07-30 MED ORDER — TRAMADOL HCL 50 MG PO TABS: 50.0000 mg | ORAL_TABLET | Freq: Two times a day (BID) | ORAL | 0 refills | Status: AC | PRN

## 2024-07-30 MED ORDER — SODIUM CHLORIDE 0.9 % IV BOLUS
250.0000 mL | Freq: Once | INTRAVENOUS | Status: AC
  Administered 2024-07-30: 250 mL via INTRAVENOUS

## 2024-07-30 NOTE — Progress Notes (Signed)
 PHARMACY - ANTICOAGULATION CONSULT NOTE  Pharmacy Consult for heparin  Indication: medical management of CAD with new CP  Allergies[1]  Patient Measurements: Height: 5' 8 (172.7 cm) Weight: 81.6 kg (180 lb) IBW/kg (Calculated) : 68.4 HEPARIN  DW (KG): 81.6  Vital Signs: Temp: 97.7 F (36.5 C) (01/30 0515) Temp Source: Oral (01/30 0515) BP: 107/71 (01/30 0515) Pulse Rate: 70 (01/30 0515)  Labs: Recent Labs    07/28/24 2117 07/29/24 0245 07/29/24 1147 07/30/24 0420  HGB 11.8* 10.4*  --  10.0*  HCT 35.9* 30.8*  --  30.4*  PLT 228 206  --  201  APTT 183*  --   --   --   LABPROT 14.1  --   --   --   INR 1.0  --   --   --   HEPARINUNFRC  --   --  0.38 0.54  CREATININE 1.05 1.01  --  1.43*    Estimated Creatinine Clearance: 29.2 mL/min (A) (by C-G formula based on SCr of 1.43 mg/dL (H)).   Medical History: Past Medical History:  Diagnosis Date   Abnormality of gait 02/09/2013   Arthritis    Cancer (HCC)    basal cell skin cancer   Cardiomegaly    Chronic kidney disease    BPH   Chronic leg pain    Hypersomnia    Hypertension    Hypoxia    pulmonary   Neuromuscular disorder (HCC)    restless leg syndrome    Nocturia    Prostate disorder    Restless legs syndrome (RLS) 02/09/2013   Restless legs syndrome with nocturnal myoclonus    RLS (restless legs syndrome)    x 40 years   Shortness of breath    with exertion    Sleep apnea    uses cpap   Stroke [facility])    minor stroke in 2003 - no lasting side effects   UARS (upper airway resistance syndrome) 06/28/2014    Assessment: Brandon Mason is a 89 y.o. year old male admitted on 07/28/2024 with concern for NSTEMI. No anticoagulation prior to admission. NSTEMI last month with severe CAD treated medically. Pharmacy consulted to dose heparin .  Heparin  level therapeutic at 0.54, CBC stable. Planning 48h heparin  (stop time would be tomorrow am ~0400).  Goal of Therapy:  Heparin  level 0.3-0.7 units/ml Monitor  platelets by anticoagulation protocol: Yes   Plan:  Heparin  1300 units/h Daily heparin  level and CBC   Brandon Mason, PharmD, BCPS, [facility] Clinical Pharmacist [phone] Please check AMION for all [facility] Pharmacy numbers 07/30/2024          [1] No Known Allergies

## 2024-07-30 NOTE — Evaluation (Signed)
 Clinical/Bedside Swallow Evaluation Patient Details  Name: Brandon Mason MRN: [id number] Date of Birth: 03/13/1926  Today's Date: 07/30/2024 Time: SLP Start Time (ACUTE ONLY): 9166 SLP Stop Time (ACUTE ONLY): 0841 SLP Time Calculation (min) (ACUTE ONLY): 8 min  Past Medical History:  Past Medical History:  Diagnosis Date   Abnormality of gait 02/09/2013   Arthritis    Cancer (HCC)    basal cell skin cancer   Cardiomegaly    Chronic kidney disease    BPH   Chronic leg pain    Hypersomnia    Hypertension    Hypoxia    pulmonary   Neuromuscular disorder (HCC)    restless leg syndrome    Nocturia    Prostate disorder    Restless legs syndrome (RLS) 02/09/2013   Restless legs syndrome with nocturnal myoclonus    RLS (restless legs syndrome)    x 40 years   Shortness of breath    with exertion    Sleep apnea    uses cpap   Stroke (HCC)    minor stroke in 2003 - no lasting side effects   UARS (upper airway resistance syndrome) 06/28/2014   Past Surgical History:  Past Surgical History:  Procedure Laterality Date   ANTERIOR CERVICAL DECOMP/DISCECTOMY FUSION N/A 03/29/2016   Procedure: ANTERIOR CERVICAL DECOMPRESSION/DISCECTOMY FUSION CERVICAL THREE- CERVICAL FOUR;  Surgeon: Rockey Peru, MD;  Location: [facility];  Service: Neurosurgery;  Laterality: N/A;   BACK SURGERY  2003   L4-5   CATARACT EXTRACTION Bilateral    ESOPHAGOGASTRODUODENOSCOPY (EGD) WITH PROPOFOL  N/A 03/17/2022   Procedure: ESOPHAGOGASTRODUODENOSCOPY (EGD) WITH PROPOFOL ;  Surgeon: Federico Rosario BROCKS, MD;  Location: [facility] ENDOS[facility];  Service: Gastroenterology;  Laterality: N/A;   HERNIA REPAIR     inguinal hernia   LEFT HEART CATH AND CORONARY ANGIOGRAPHY N/A 06/30/2024   Procedure: LEFT HEART CATH AND CORONARY ANGIOGRAPHY;  Surgeon: Ladona Heinz, MD;  Location: [facility];  Service: Cardiovascular;  Laterality: N/A;   TRANSURETHRAL PROSTATECTOMY WITH GYRUS INSTRUMENTS  04/28/2012   Procedure: TRANSURETHRAL  PROSTATECTOMY WITH GYRUS INSTRUMENTS;  Surgeon: Donnice Gwenyth Brooks, MD;  Location: [facility];  Service: Urology;  Laterality: N/A;   TRANSURETHRAL RESECTION OF PROSTATE  03/24/2012   Procedure: TRANSURETHRAL RESECTION OF THE PROSTATE (TURP);  Surgeon: Donnice Gwenyth Brooks, MD;  Location: [facility];  Service: Urology;  Laterality: N/A;  with gyrus ,Greenlight PVP laser of Prostate   HPI:  89 y.o. male presented to ED on 07/28/24 with retrosternal chet pain made worse with exertion, SOB, and vomiting. EKG + for ischemic changes. PMHx: NSTEMI, essential hypertension, chronic respiratory failure with hypoxia on oxygen , history of severe right ICA history of GI bleed due to gastritis and esophagitis. CXR 07/28/24 1. Persistent low lung volumes with patchy bibasilar opacities, similar to recent radiographs. 2. Mild pulmonary edema with trace pleural effusions. 3. Cardiomegaly.    Assessment / Plan / Recommendation  Clinical Impression  Pt seen for clinical swallowing evaluation. Pt alert, pleasant, and cooperative. On 2.5L/min O2 via [place]. Pt demonstrated intact oropharyngeal swallow function per clinical assessment. Recommend continuation of a regular diet with thin liquids with standard aspiration precautions as pt noted occasionally coughing when I eat too fast. Pt endorsed cough without POs as well. Not observed during today's assessment. No f/u ST services recommended at this time.  SLP Visit Diagnosis: Dysphagia, unspecified (R13.10)    Aspiration Risk  Mild aspiration risk    Diet Recommendation Regular;Thin liquid    Liquid  Administration via: Spoon;Cup;Straw Medication Administration:  (as tolerated) Supervision: Patient able to self feed Compensations: Slow rate;Small sips/bites Postural Changes: Seated upright at 90 degrees;Remain upright for at least 30 minutes after po intake     Functional Status Assessment Patient has not had a recent decline in their functional status          Prognosis Prognosis for improved oropharyngeal function: Good      Swallow Study   General Date of Onset: 07/28/24 (adm date) HPI: 89 y.o. male presented to ED on 07/28/24 with retrosternal chet pain made worse with exertion, SOB, and vomiting. EKG + for ischemic changes. PMHx: NSTEMI, essential hypertension, chronic respiratory failure with hypoxia on oxygen , history of severe right ICA history of GI bleed due to gastritis and esophagitis. CXR 07/28/24 1. Persistent low lung volumes with patchy bibasilar opacities, similar to recent radiographs. 2. Mild pulmonary edema with trace pleural effusions. 3. Cardiomegaly. Type of Study: Bedside Swallow Evaluation Previous Swallow Assessment: none Diet Prior to this Study: Regular;Thin liquids (Level 0) Temperature Spikes Noted: No (WBC 6.3) Respiratory Status: Nasal cannula (2.5L/min via [place]) History of Recent Intubation: No Behavior/Cognition: Alert;Cooperative;Pleasant mood Oral Cavity Assessment: Within Functional Limits Oral Care Completed by SLP: Recent completion by staff Oral Cavity - Dentition: Missing dentition Vision: Functional for self-feeding Self-Feeding Abilities: Able to feed self Patient Positioning: Upright in bed Baseline Vocal Quality: Normal Volitional Cough: Strong Volitional Swallow: Able to elicit    Oral/Motor/Sensory Function Overall Oral Motor/Sensory Function: Within functional limits   Ice Chips Ice chips: Not tested   Thin Liquid Thin Liquid: Within functional limits Presentation: Cup;Straw    Nectar Thick Nectar Thick Liquid: Not tested   Honey Thick Honey Thick Liquid: Not tested   Puree Puree: Within functional limits Presentation: Self Fed   Solid     Solid: Within functional limits Presentation: Self Fed     Brandon Mason, M.S., CCC-SLP Speech-Language Pathologist Secure Chat Preferred  O: [phone]  Brandon HERO Ikram Riebe 07/30/2024,10:06 AM

## 2024-07-30 NOTE — Progress Notes (Addendum)
 "  Rounding Note   Patient Name: Brandon Mason Date of Encounter: 07/30/2024  [facility] Cardiologist: Jerel Balding, MD   Subjective No acute overnight events. He denies any chest pain and states his breathing is fine. He is still on 2-3L of O2 but he states this is what he is on at home. He is eager to go home.  Scheduled Meds:  aspirin  EC  81 mg Oral Daily   atorvastatin   80 mg Oral Daily   clopidogrel   75 mg Oral Daily   finasteride   5 mg Oral Daily   hydrALAZINE   50 mg Oral BID   levothyroxine   25 mcg Oral Daily   [START ON 07/31/2024] lisinopril   20 mg Oral Daily   metoprolol  tartrate  12.5 mg Oral BID   pantoprazole   40 mg Oral Q0600   pregabalin   150 mg Oral BID   rOPINIRole   2 mg Oral BID   rOPINIRole   3 mg Oral QHS   traMADol   50 mg Oral QID   Continuous Infusions:  heparin  1,300 Units/hr (07/29/24 2213)   PRN Meds: nitroGLYCERIN , ondansetron  (ZOFRAN ) IV, polyethylene glycol   Vital Signs  Vitals:   07/29/24 2208 07/29/24 2250 07/30/24 0515 07/30/24 0836  BP: 133/74  107/71 115/72  Pulse: 81 80 70 68  Resp:   18 18  Temp:   97.7 F (36.5 C) 98 F (36.7 C)  TempSrc:   Oral Oral  SpO2:  100% 94% 95%  Weight:      Height:       No intake or output data in the 24 hours ending 07/30/24 0950    07/28/2024    9:22 PM 07/21/2024    3:01 PM 06/29/2024   12:12 PM  Last 3 Weights  Weight (lbs) 180 lb 186 lb 180 lb  Weight (kg) 81.647 kg 84.369 kg 81.647 kg      Telemetry Normal sinus rhythm with rates in the 60s to 70s. - Personally Reviewed  ECG  No new ECG tracing today. - Personally Reviewed  Physical Exam  GEN: No acute distress.  On 2.5L of O2 via nasal cannula. Neck: No JVD. Cardiac: RRR. No murmurs, rubs, or gallops.  Respiratory: No increased work of breathing. Decreased breath sounds in bilateral bases. No significant wheezes, rhonchi, or rales appreciated. MS: No lower extremity edema. No deformity. Neuro:  No focal  deficits. Psych: Normal affect. Responds appropriately.  Labs High Sensitivity Troponin:  No results for input(s): TROPONINIHS in the last 720 hours.  Recent Labs  Lab 07/28/24 2117 07/29/24 0003  TRNPT 45* 44*       Chemistry Recent Labs  Lab 07/28/24 2117 07/29/24 0245 07/30/24 0420  NA 141 140 139  K 3.3* 3.7 4.0  CL 102 105 103  CO2 24 25 28   GLUCOSE 118* 114* 100*  BUN 26* 24* 31*  CREATININE 1.05 1.01 1.43*  CALCIUM  9.1 8.7* 8.6*  PROT 7.2 6.2*  --   ALBUMIN 3.9 3.4*  --   AST 23 16  --   ALT 11 8  --   ALKPHOS 60 51  --   BILITOT 0.8 0.6  --   GFRNONAA >60 >60 45*  ANIONGAP 15 11 8     Lipids  Recent Labs  Lab 07/28/24 2117  CHOL 105  TRIG 68  HDL 51  LDLCALC 40  CHOLHDL 2.1    Hematology Recent Labs  Lab 07/28/24 2117 07/29/24 0245 07/30/24 0420  WBC 7.5 7.1 6.3  RBC 3.55* 3.10* 3.02*  HGB 11.8* 10.4* 10.0*  HCT 35.9* 30.8* 30.4*  MCV 101.1* 99.4 100.7*  MCH 33.2 33.5 33.1  MCHC 32.9 33.8 32.9  RDW 13.7 13.6 13.9  PLT 228 206 201   Thyroid  No results for input(s): TSH, FREET4 in the last 168 hours.  BNP Recent Labs  Lab 07/28/24 2117  PROBNP 1,226.0*    DDimer No results for input(s): DDIMER in the last 168 hours.   Radiology  CT RENAL STONE STUDY Result Date: 07/29/2024 EXAM: CT ABDOMEN AND PELVIS WITHOUT CONTRAST 07/29/2024 01:39:54 AM TECHNIQUE: CT of the abdomen and pelvis was performed without the administration of intravenous contrast. Multiplanar reformatted images are provided for review. Automated exposure control, iterative reconstruction, and/or weight-based adjustment of the mA/kV was utilized to reduce the radiation dose to as low as reasonably achievable. COMPARISON: Prior examination of 09/01/2022. CLINICAL HISTORY: Abdominal/flank pain, stone suspected. Abdominal pain and flank pain, stone suspected. FINDINGS: LOWER CHEST: Bibasilar atelectasis. Superimposed consolidation within the posterobasal left lower lobe  appears stable since prior examination of 09/01/2022 and likely represents chronic consolidation / fibrosis. Trace bilateral pleural effusions. Cardiomegaly. Extensive coronary artery calcifications. LIVER: The liver is unremarkable. GALLBLADDER AND BILE DUCTS: Gallbladder is unremarkable. No biliary ductal dilatation. SPLEEN: No acute abnormality. PANCREAS: No acute abnormality. ADRENAL GLANDS: No acute abnormality. KIDNEYS, URETERS AND BLADDER: Multiple hyperdense and simple cortical cysts are seen within the left kidney for which no follow up imaging is recommended. 1 mm punctate nonobstructing calculus within the lower pole of the left kidney. No ureteral calculi. No hydronephrosis. No perinephric inflammatory stranding or fluid collections were identified. The bladder is unremarkable. GI AND BOWEL: Mild pan colonic diverticulosis without superimposed acute inflammatory change. Appendix normal. The stomach, small bowel, and large bowel are otherwise unremarkable. There is no bowel obstruction. PERITONEUM AND RETROPERITONEUM: No ascites. No free air. VASCULATURE: Aorta is normal in caliber. Extensive aortoiliac atherosclerotic calcification. LYMPH NODES: No lymphadenopathy. REPRODUCTIVE ORGANS: Moderate prostatic hypertrophy. Post transurethral resection defect within the central prostate gland. BONES AND SOFT TISSUES: Osseous structures are diffusely osteopenic. Advanced degenerative changes are seen within the lumbar spine. No focal soft tissue abnormality. IMPRESSION: 1. No acute findings in the abdomen or pelvis. 2. Punctate 1 mm nonobstructing left lower pole renal calculus, without hydronephrosis or ureteral calculus. 3. Stable chronic posterobasal left lower lobe consolidation/fibrosis with bibasilar atelectasis and trace bilateral pleural effusions. 4. Cardiomegaly with extensive coronary artery calcifications and extensive aortoiliac atherosclerotic calcification. 5. Mild pancolonic diverticulosis, 6.  Moderate prostatomegaly with post transurethral resection defect Electronically signed by: Dorethia Molt MD 07/29/2024 01:52 AM EST RP Workstation: [id number]   DG Chest Port 1 View Result Date: 07/28/2024 EXAM: 1 VIEW(S) XRAY OF THE CHEST 07/28/2024 09:14:00 PM COMPARISON: 06/29/2024 CLINICAL HISTORY: Chills, cough, pleuritic chest pain, and shortness of breath. History of clots and recent non-ST elevation myocardial infarction. FINDINGS: LUNGS AND PLEURA: Persistent low lung volumes with patchy bibasilar opacities, similar to recent radiographs. Mild pulmonary edema. Trace pleural effusions. No pneumothorax. HEART AND MEDIASTINUM: Cardiomegaly. Stable mediastinal contours with aortic atherosclerosis. BONES AND SOFT TISSUES: Degenerative changes in spine and shoulders. IMPRESSION: 1. Persistent low lung volumes with patchy bibasilar opacities, similar to recent radiographs. 2. Mild pulmonary edema with trace pleural effusions. 3. Cardiomegaly. Electronically signed by: Oneil Devonshire MD 07/28/2024 09:18 PM EST RP Workstation: [id number]    Cardiac Studies  Left Cardiac Catheterization 06/30/2024: Hemodynamic data: EDP 10 mmHg.  No pressure gradient across aortic valve.   Angiographic data:  LM: Very large caliber vessel, distal left main has 10 to 20% stenosis. LCx: Very large caliber vessel giving origin to a large OM1, moderate to large AV groove circumflex and there is a 40 to 50% bifurcation stenosis in both branches.  Otherwise vessels are large and smooth. LAD: Large-caliber vessel, severely calcified in the proximal segment with irregular lumen and a 70% stenosis.  Gives origin to large D1 which is ulcerated with ostial and proximal tandem 80 to 95% stenosis.  Mid to distal LAD has mild disease. RCA: Dominant vessel and a large vessel and occluded in the proximal segment, CTO.  Contralateral collaterals from the LAD.      Impression and recommendations: Complex LAD D1 bifurcation disease,  suspect his marked EKG abnormality in the high lateral leads is related to diagonal disease, due to complexity of the disease and his advanced age, would recommend continued medical therapy.  He did develop right forearm hematoma while trying to place TR band due to elevated blood pressure and large wrist, hence blood pressure cuff had to be applied.-Will be restarted 8 hours post TR band removal.   Patient Profile   89 y.o. male with a history of CAD with NSTEMI in 05/2024 (treated medically), carotid/ artery disease, CVA, hypertension, hyperlipidemia, type 2 diabetes melitis, prior DVT,  prior GI bleeding, and obstructive sleep apnea on CPAP.  He was admitted on 07/28/2024 after presenting with chest pain and shortness of breath.  Assessment & Plan   Chest Pain CAD Patient was recently admitted for an NSTEMI at the end of 05/2024.  LHC at that time showed complex LAD/D1 bifurcation disease.  Medical therapy was recommended given complexity of disease and advanced age.  He presented back to the ED on 1/28 for chest pain and shortness of breath.  Initial EKG showed normal sinus rhythm with RBBB and ST changes that were concerning for inferior STEMI.  However, STEMI doc reviewed EKG and felt like he did not meet criteria.  High sensitive troponin minimally elevated and flat at 45 >> 46. - He denies any recurrent chest pain and states his breathing is at baseline. - Continue Lopressor  12.5mg  twice daily.  - Plan is for Heparin  for 48 hours. He will complete this tonight around 2100. - Continue DAPT with Aspirin  and Plavix .  - Continue Lipitor 80mg  daily.  - Will continue with plans for medical therapy.  Carotid Artery Disease Head/ neck CTA in 05/2024 showed severe (at least 85%) at the origin of the right cervical ICA and approximately 55% stenosis at the left carotid bifurcation/ proximal left cervical ICA. - Continue DAPT and statin. - He has been seen by Vascular Surgery and plan is for medical  management.  Hypertension BP well controlled over the last 24 hours. - Continue Lopressor  12.5mg  twice daily. - Continue Hydralazine  50mg  twice daily. - Lisinopril  on hold today given Aki.  Hyperlipidemia Lipid panel this admission: Total Cholesterol 105, Triglycerides 68, HDL 51, LDL 40.  - Continue Lipitor 80mg  daily.  Mild AKI Creatinine up to 1.43 today. Baseline around 1.0. - Primary team ordered 250cc NaCl bolus. - Agree with holding Lisinopril  today. - Continue to monitor.   Otherwise, per primary team: - Lactic acidosis - Type 2 diabetes mellitus - Chronic anemia - Restless leg syndrome - Cough - Obstructive sleep apnea  For questions or updates, please contact [facility] Please consult [email or web address] for contact info under     Signed, Callie E Goodrich, [name]  07/30/2024, 9:50 AM  Patient seen and examined.  Agree with above documentation.On exam, patient is alert and oriented, regular rate and rhythm, no murmurs, lungs CTAB, no LE edema or JVD.  Denies any chest pain.  He was started on heparin  drip with plan for 48 hours, can stop tonight.  Labs notable for creatinine increased from 1.0-1.4.  Holding lisinopril  and he was given small IV fluid bolus.  Will follow renal function  Lonni LITTIE Nanas, MD    "

## 2024-07-30 NOTE — Progress Notes (Signed)
 [facility] PROGRESS NOTE    Progress Note  Brandon Mason  FMW:[id number] DOB: 05-28-1926 DOA: 07/28/2024 PCP: [facility]     Brief Narrative:   Brandon Mason is an 89 y.o. male past medical history of NSTEMI treated conservatively, essential hypertension chronic respiratory failure with hypoxia on oxygen , history of severe right ICA history of GI bleed due to gastritis and esophagitis in 2023 brought into the ED after started having sudden onset of retrosternal chest pain made worse with exertion shortness of breath nausea and vomiting, twelve-lead EKG showed ischemic changes, started on IV heparin  and aspirin  and Plavix . Assessment/Plan:   Non-ST elevation MI (NSTEMI) [facility]) Cardiology was consulted recommended conservative management.   Not a candidate for PCI on admission Cardiac biomarkers have remained flat. Continue IV heparin  aspirin , Plavix , beta-blockers and statins. Awaiting cardiology further recommendations.  Persistent cough: Chest x-ray showed chronic changes has remained afebrile. Now resolved. Continue PPI.  Essential hypertension: Continue beta-blockers, hydralazine  lisinopril  and Lasix . Blood pressure is improved now.  Chronic microcytic anemia: There is a mild drop in hemoglobin from 11.8-10.4, continue to monitor CBC. He denies any melanotic stools of bright red blood per rectum.  Hypokalemia: Replete orally now improved.  Restless legs syndrome (RLS) Continue Requip .  OSA on CPAP Noted  DVT prophylaxis: heparin  Family Communication:none Status is: Inpatient Remains inpatient appropriate because: NSTEMI    Code Status:     Code Status Orders  (From admission, onward)           Start     Ordered   07/29/24 0108  Do not attempt resuscitation (DNR)- Limited -Do Not Intubate (DNI)  Continuous       Question Answer Comment  If pulseless and not breathing No CPR or chest compressions.   In Pre-Arrest Conditions  (Patient Is Breathing and Has A Pulse) Do not intubate. Provide all appropriate non-invasive medical interventions. Avoid ICU transfer unless indicated or required.   Consent: Discussion documented in EHR or advanced directives reviewed      07/29/24 0108           Code Status History     Date Active Date Inactive Code Status Order ID Comments User Context   07/28/2024 2350 07/29/2024 0108 Limited: Do not attempt resuscitation (DNR) -DNR-LIMITED -Do Not Intubate/DNI  [id number]  Franky Redia SAILOR, MD ED   06/30/2024 0114 07/03/2024 1606 Limited: Do not attempt resuscitation (DNR) -DNR-LIMITED -Do Not Intubate/DNI  [id number]  Franky Redia SAILOR, MD ED   08/27/2022 1612 09/09/2022 1858 DNR [id number]  Rosalba Glendale DEL, [name] ED   03/17/2022 0916 03/20/2022 2019 DNR [id number]  Laurence Locus, DO Inpatient   03/16/2022 2333 03/17/2022 0916 DNR [id number]  Laurence Locus, DO ED   03/29/2016 1742 03/30/2016 1349 Full Code [id number]  Gillie Duncans, MD Inpatient   03/27/2012 1745 03/28/2012 1400 Full Code [id number]  Evertt Prentice MATSU, RN Inpatient      Advance Directive Documentation    Flowsheet Row Most Recent Value  Type of Advance Directive Healthcare Power of [place], Living will, Out of facility DNR (pink MOST or yellow form)  Pre-existing out of facility DNR order (yellow form or pink MOST form) --  MOST Form in Place? --      IV Access:   Peripheral IV   Procedures and diagnostic studies:   CT RENAL STONE STUDY Result Date: 07/29/2024 EXAM: CT ABDOMEN AND PELVIS WITHOUT CONTRAST 07/29/2024 01:39:54 AM TECHNIQUE: CT of the abdomen and pelvis was performed without the administration  of intravenous contrast. Multiplanar reformatted images are provided for review. Automated exposure control, iterative reconstruction, and/or weight-based adjustment of the mA/kV was utilized to reduce the radiation dose to as low as reasonably achievable. COMPARISON: Prior examination of 09/01/2022. CLINICAL HISTORY:  Abdominal/flank pain, stone suspected. Abdominal pain and flank pain, stone suspected. FINDINGS: LOWER CHEST: Bibasilar atelectasis. Superimposed consolidation within the posterobasal left lower lobe appears stable since prior examination of 09/01/2022 and likely represents chronic consolidation / fibrosis. Trace bilateral pleural effusions. Cardiomegaly. Extensive coronary artery calcifications. LIVER: The liver is unremarkable. GALLBLADDER AND BILE DUCTS: Gallbladder is unremarkable. No biliary ductal dilatation. SPLEEN: No acute abnormality. PANCREAS: No acute abnormality. ADRENAL GLANDS: No acute abnormality. KIDNEYS, URETERS AND BLADDER: Multiple hyperdense and simple cortical cysts are seen within the left kidney for which no follow up imaging is recommended. 1 mm punctate nonobstructing calculus within the lower pole of the left kidney. No ureteral calculi. No hydronephrosis. No perinephric inflammatory stranding or fluid collections were identified. The bladder is unremarkable. GI AND BOWEL: Mild pan colonic diverticulosis without superimposed acute inflammatory change. Appendix normal. The stomach, small bowel, and large bowel are otherwise unremarkable. There is no bowel obstruction. PERITONEUM AND RETROPERITONEUM: No ascites. No free air. VASCULATURE: Aorta is normal in caliber. Extensive aortoiliac atherosclerotic calcification. LYMPH NODES: No lymphadenopathy. REPRODUCTIVE ORGANS: Moderate prostatic hypertrophy. Post transurethral resection defect within the central prostate gland. BONES AND SOFT TISSUES: Osseous structures are diffusely osteopenic. Advanced degenerative changes are seen within the lumbar spine. No focal soft tissue abnormality. IMPRESSION: 1. No acute findings in the abdomen or pelvis. 2. Punctate 1 mm nonobstructing left lower pole renal calculus, without hydronephrosis or ureteral calculus. 3. Stable chronic posterobasal left lower lobe consolidation/fibrosis with bibasilar  atelectasis and trace bilateral pleural effusions. 4. Cardiomegaly with extensive coronary artery calcifications and extensive aortoiliac atherosclerotic calcification. 5. Mild pancolonic diverticulosis, 6. Moderate prostatomegaly with post transurethral resection defect Electronically signed by: Dorethia Molt MD 07/29/2024 01:52 AM EST RP Workstation: [id number]   DG Chest Port 1 View Result Date: 07/28/2024 EXAM: 1 VIEW(S) XRAY OF THE CHEST 07/28/2024 09:14:00 PM COMPARISON: 06/29/2024 CLINICAL HISTORY: Chills, cough, pleuritic chest pain, and shortness of breath. History of clots and recent non-ST elevation myocardial infarction. FINDINGS: LUNGS AND PLEURA: Persistent low lung volumes with patchy bibasilar opacities, similar to recent radiographs. Mild pulmonary edema. Trace pleural effusions. No pneumothorax. HEART AND MEDIASTINUM: Cardiomegaly. Stable mediastinal contours with aortic atherosclerosis. BONES AND SOFT TISSUES: Degenerative changes in spine and shoulders. IMPRESSION: 1. Persistent low lung volumes with patchy bibasilar opacities, similar to recent radiographs. 2. Mild pulmonary edema with trace pleural effusions. 3. Cardiomegaly. Electronically signed by: Oneil Devonshire MD 07/28/2024 09:18 PM EST RP Workstation: [id number]     Medical Consultants:   None.   Subjective:    [name] denies any chest pain or shortness of breath.  Objective:    Vitals:   07/29/24 1947 07/29/24 2208 07/29/24 2250 07/30/24 0515  BP: 103/61 133/74  107/71  Pulse: 66 81 80 70  Resp: 20   18  Temp: 98.1 F (36.7 C)   97.7 F (36.5 C)  TempSrc: Oral   Oral  SpO2: 94%  100% 94%  Weight:      Height:       SpO2: 94 % O2 Flow Rate (L/min): 2.5 L/min  No intake or output data in the 24 hours ending 07/30/24 0750 Filed Weights   07/28/24 2122  Weight: 81.6 kg    Exam: General exam: In  no acute distress. Respiratory system: Good air movement and clear to auscultation. Cardiovascular  system: S1 & S2 heard, RRR. No JVD. Gastrointestinal system: Abdomen is nondistended, soft and nontender.  Extremities: No pedal edema. Skin: No rashes, lesions or ulcers Psychiatry: Judgement and insight appear normal. Mood & affect appropriate.  Data Reviewed:    Labs: Basic Metabolic Panel: Recent Labs  Lab 07/28/24 2117 07/29/24 0245 07/30/24 0420  NA 141 140 139  K 3.3* 3.7 4.0  CL 102 105 103  CO2 24 25 28   GLUCOSE 118* 114* 100*  BUN 26* 24* 31*  CREATININE 1.05 1.01 1.43*  CALCIUM  9.1 8.7* 8.6*   GFR Estimated Creatinine Clearance: 29.2 mL/min (A) (by C-G formula based on SCr of 1.43 mg/dL (H)). Liver Function Tests: Recent Labs  Lab 07/28/24 2117 07/29/24 0245  AST 23 16  ALT 11 8  ALKPHOS 60 51  BILITOT 0.8 0.6  PROT 7.2 6.2*  ALBUMIN 3.9 3.4*   Recent Labs  Lab 07/28/24 2117  LIPASE 17   No results for input(s): AMMONIA in the last 168 hours. Coagulation profile Recent Labs  Lab 07/28/24 2117  INR 1.0   COVID-19 Labs  No results for input(s): DDIMER, FERRITIN, LDH, CRP in the last 72 hours.  Lab Results  Component Value Date   SARSCOV2NAA NEGATIVE 07/28/2024   SARSCOV2NAA NEGATIVE 03/16/2022    CBC: Recent Labs  Lab 07/28/24 2117 07/29/24 0245 07/30/24 0420  WBC 7.5 7.1 6.3  NEUTROABS 4.3  --   --   HGB 11.8* 10.4* 10.0*  HCT 35.9* 30.8* 30.4*  MCV 101.1* 99.4 100.7*  PLT 228 206 201   Cardiac Enzymes: No results for input(s): CKTOTAL, CKMB, CKMBINDEX, TROPONINI in the last 168 hours. BNP (last 3 results) Recent Labs    06/29/24 2102 07/28/24 2117  PROBNP 2,135.0* 1,226.0*   CBG: No results for input(s): GLUCAP in the last 168 hours. D-Dimer: No results for input(s): DDIMER in the last 72 hours. Hgb A1c: Recent Labs    07/28/24 2117  HGBA1C 5.9*   Lipid Profile: Recent Labs    07/28/24 2117  CHOL 105  HDL 51  LDLCALC 40  TRIG 68  CHOLHDL 2.1   Thyroid  function studies: No results  for input(s): TSH, T4TOTAL, T3FREE, THYROIDAB in the last 72 hours.  Invalid input(s): FREET3 Anemia work up: No results for input(s): VITAMINB12, FOLATE, FERRITIN, TIBC, IRON, RETICCTPCT in the last 72 hours. Sepsis Labs: Recent Labs  Lab 07/28/24 2117 07/28/24 2128 07/29/24 0245 07/30/24 0420  WBC 7.5  --  7.1 6.3  LATICACIDVEN  --  2.4*  --   --    Microbiology Recent Results (from the past 240 hours)  Blood culture (routine x 2)     Status: None (Preliminary result)   Collection Time: 07/28/24 10:23 PM   Specimen: BLOOD RIGHT HAND  Result Value Ref Range Status   Specimen Description BLOOD RIGHT HAND  Final   Special Requests   Final    BOTTLES DRAWN AEROBIC AND ANAEROBIC Blood Culture adequate volume   Culture   Final    NO GROWTH 2 DAYS Performed at [facility], [address] [place], [place] [address]    Report Status PENDING  Incomplete  Resp panel by RT-PCR (RSV, Flu A&B, Covid) Anterior Nasal Swab     Status: None   Collection Time: 07/28/24 10:38 PM   Specimen: Anterior Nasal Swab  Result Value Ref Range Status   SARS Coronavirus 2  by RT PCR NEGATIVE NEGATIVE Final   Influenza A by PCR NEGATIVE NEGATIVE Final   Influenza B by PCR NEGATIVE NEGATIVE Final    Comment: (NOTE) The Xpert Xpress SARS-CoV-2/FLU/RSV plus assay is intended as an aid in the diagnosis of influenza from Nasopharyngeal swab specimens and should not be used as a sole basis for treatment. Nasal washings and aspirates are unacceptable for Xpert Xpress SARS-CoV-2/FLU/RSV testing.  Fact Sheet for Patients: bloggercourse.com  Fact Sheet for Healthcare Providers: seriousbroker.it  This test is not yet approved or cleared by the [place]  FDA and has been authorized for detection and/or diagnosis of SARS-CoV-2 by FDA under an Emergency Use Authorization (EUA). This EUA will remain in effect (meaning this test  can be used) for the duration of the COVID-19 declaration under Section 564(b)(1) of the Act, 21 U.S.C. section 360bbb-3(b)(1), unless the authorization is terminated or revoked.     Resp Syncytial Virus by PCR NEGATIVE NEGATIVE Final    Comment: (NOTE) Fact Sheet for Patients: bloggercourse.com  Fact Sheet for Healthcare Providers: seriousbroker.it  This test is not yet approved or cleared by the [place]  FDA and has been authorized for detection and/or diagnosis of SARS-CoV-2 by FDA under an Emergency Use Authorization (EUA). This EUA will remain in effect (meaning this test can be used) for the duration of the COVID-19 declaration under Section 564(b)(1) of the Act, 21 U.S.C. section 360bbb-3(b)(1), unless the authorization is terminated or revoked.  Performed at [facility], [address] [place], [place] [address]      Medications:    aspirin  EC  81 mg Oral Daily   atorvastatin   80 mg Oral Daily   clopidogrel   75 mg Oral Daily   finasteride   5 mg Oral Daily   furosemide   20 mg Oral QODAY   hydrALAZINE   50 mg Oral BID   levothyroxine   25 mcg Oral Daily   lisinopril   20 mg Oral Daily   metoprolol  tartrate  12.5 mg Oral BID   pantoprazole   40 mg Oral Q0600   pregabalin   150 mg Oral BID   rOPINIRole   2 mg Oral BID   rOPINIRole   3 mg Oral QHS   traMADol   50 mg Oral QID   Continuous Infusions:  heparin  1,300 Units/hr (07/29/24 2213)      LOS: 2 days   Erle Odell Castor  [facility]  07/30/2024, 7:50 AM

## 2024-07-30 NOTE — Plan of Care (Signed)

## 2024-07-30 NOTE — Progress Notes (Addendum)
 " Daily Progress Note   Date: 07/30/2024   Patient Name: Brandon Mason  DOB: 03-04-1926  MRN: [id number]  Age / Sex: 89 y.o., male  Attending Physician: Odell Castor, Erle, MD Primary Care Physician: [facility] Admit Date: 07/28/2024 Length of Stay: 2 days  Reason for Follow-up: Establishing goals of care  Past Medical History:  Diagnosis Date   Abnormality of gait 02/09/2013   Arthritis    Cancer (HCC)    basal cell skin cancer   Cardiomegaly    Chronic kidney disease    BPH   Chronic leg pain    Hypersomnia    Hypertension    Hypoxia    pulmonary   Neuromuscular disorder (HCC)    restless leg syndrome    Nocturia    Prostate disorder    Restless legs syndrome (RLS) 02/09/2013   Restless legs syndrome with nocturnal myoclonus    RLS (restless legs syndrome)    x 40 years   Shortness of breath    with exertion    Sleep apnea    uses cpap   Stroke [facility])    minor stroke in 2003 - no lasting side effects   UARS (upper airway resistance syndrome) 06/28/2014    Assessment & Plan:   HPI/Patient Profile:   89 y.o. male  with past medical history of HTN, NSTEMI (05/2024), OSA on CPAP, hypothyroidism admitted on 07/28/2024 with NSTEMI. Per H&P on 07/28/2024 by Franky MD patient presented with shortness of breath, cough, and retrosternal nonradiating chest pain. Of note patient was admitted 12/30-1/3 for NSTEMI with medical management after LHC (12/31) demonstrated LAD D1 bifurcation disease and patient deemed not a good candidate for PCI. Patient initiated on heparin  infusion for NSTEMI.    Palliative medicine consulted for goals of care conversation.   SUMMARY OF RECOMMENDATIONS DNR-limited Continue current management, hopes of returning home with home health services PT/OT/SLP  Symptom Management:  Per primary team  Code Status: DNR - Limited (DNR/DNI)  Prognosis: Unable to determine  Discharge Planning: To Be Determined  Discussed with: Odell Castor MD 07/30/2024 about daughter's wish to discharge home despite need to complete 48 hours of heparin  and his increase in creatinine.   Subjective:   Subjective: Chart Reviewed. Updates received. Patient Assessed. Created space and opportunity for patient  and family to explore thoughts and feelings regarding current medical situation.  Today's Discussion:  Met with patient at bedside, resting and was not disturbed. Spoke with bedside RN who shares patient overall doing well, no complaints of chest pain or any discomforts.   Spoke on phone with daughter and she requests to speak with primary team about possible discharge. Shared with her that the patient currently needs to finish his 48 hours of heparin  and also new increase in creatinine might need to be monitored, but that will be at the discretion of the primary attending. She really hopes to get the patient home before the snow storm this weekend. Shared with her that I would notify primary team of her request to speak with them.   Review of Systems  Unable to perform ROS   Objective:   Primary Diagnoses: Present on Admission:  Non-ST elevation MI (NSTEMI) (HCC)  Restless legs syndrome (RLS)  Hypothyroidism  Essential hypertension  Macrocytic anemia  BPH (benign prostatic hyperplasia)  AKI (acute kidney injury)   Vital Signs:  BP (!) 146/67 (BP Location: Left Arm)   Pulse 73   Temp 97.6 F (36.4 C) (Oral)  Resp 16   Ht 5' 8 (1.727 m)   Wt 81.6 kg   SpO2 100%   BMI 27.37 kg/m   Physical Exam Constitutional:      Comments: Resting comfortably in chair.     Palliative Assessment/Data: 60%   Existing Vynca/ACP Documentation: Awaiting daughter to bring in documentation  Thank you for allowing us  to participate in the care of SAVYON LOKEN PMT will continue to support holistically.  I personally spent a total of 25 minutes in the care of the patient today including preparing to see the patient,  getting/reviewing separately obtained history, performing a medically appropriate exam/evaluation, counseling and educating, referring and communicating with other health care professionals, and documenting clinical information in the EHR.   Fairy FORBES Shan DEVONNA  Palliative Medicine Team  Team Phone # [phone] (Nights/Weekends) 07/30/2024 2:57 PM  "

## 2024-07-31 ENCOUNTER — Inpatient Hospital Stay (HOSPITAL_COMMUNITY)

## 2024-07-31 DIAGNOSIS — N179 Acute kidney failure, unspecified: Secondary | ICD-10-CM | POA: Diagnosis not present

## 2024-07-31 DIAGNOSIS — R079 Chest pain, unspecified: Secondary | ICD-10-CM

## 2024-07-31 DIAGNOSIS — I214 Non-ST elevation (NSTEMI) myocardial infarction: Secondary | ICD-10-CM | POA: Diagnosis not present

## 2024-07-31 LAB — CBC
HCT: 30.9 % — ABNORMAL LOW (ref 39.0–52.0)
Hemoglobin: 10 g/dL — ABNORMAL LOW (ref 13.0–17.0)
MCH: 32.9 pg (ref 26.0–34.0)
MCHC: 32.4 g/dL (ref 30.0–36.0)
MCV: 101.6 fL — ABNORMAL HIGH (ref 80.0–100.0)
Platelets: 189 10*3/uL (ref 150–400)
RBC: 3.04 MIL/uL — ABNORMAL LOW (ref 4.22–5.81)
RDW: 13.7 % (ref 11.5–15.5)
WBC: 6.5 10*3/uL (ref 4.0–10.5)
nRBC: 0 % (ref 0.0–0.2)

## 2024-07-31 LAB — ECHOCARDIOGRAM COMPLETE
AR max vel: 1.63 cm2
AV Area VTI: 1.66 cm2
AV Area mean vel: 1.78 cm2
AV Mean grad: 7 mmHg
AV Peak grad: 11.7 mmHg
Ao pk vel: 1.71 m/s
Area-P 1/2: 2.29 cm2
Height: 68 in
S' Lateral: 3.6 cm
Weight: 2880 [oz_av]

## 2024-07-31 LAB — BASIC METABOLIC PANEL WITH GFR
Anion gap: 6 (ref 5–15)
BUN: 36 mg/dL — ABNORMAL HIGH (ref 8–23)
CO2: 28 mmol/L (ref 22–32)
Calcium: 8.5 mg/dL — ABNORMAL LOW (ref 8.9–10.3)
Chloride: 103 mmol/L (ref 98–111)
Creatinine, Ser: 1.16 mg/dL (ref 0.61–1.24)
GFR, Estimated: 58 mL/min — ABNORMAL LOW
Glucose, Bld: 96 mg/dL (ref 70–99)
Potassium: 4.4 mmol/L (ref 3.5–5.1)
Sodium: 136 mmol/L (ref 135–145)

## 2024-07-31 NOTE — Progress Notes (Signed)
 "  Rounding Note   Patient Name: Brandon Mason Date of Encounter: 07/31/2024  [facility] Cardiologist: Jerel Balding, MD   Subjective BP 110/52, creatinine improved to 1.16.  Denies any chest pain or dyspnea  Scheduled Meds:  aspirin  EC  81 mg Oral Daily   atorvastatin   80 mg Oral Daily   clopidogrel   75 mg Oral Daily   finasteride   5 mg Oral Daily   hydrALAZINE   50 mg Oral BID   levothyroxine   25 mcg Oral Daily   lisinopril   20 mg Oral Daily   metoprolol  tartrate  12.5 mg Oral BID   pantoprazole   40 mg Oral Q0600   pregabalin   150 mg Oral BID   rOPINIRole   2 mg Oral BID   rOPINIRole   3 mg Oral QHS   traMADol   50 mg Oral QID   Continuous Infusions:   PRN Meds: nitroGLYCERIN , ondansetron  (ZOFRAN ) IV, polyethylene glycol   Vital Signs  Vitals:   07/31/24 0443 07/31/24 0805 07/31/24 1041 07/31/24 1042  BP: (!) 135/59 (!) 132/56 (!) 110/52 (!) 110/52  Pulse: 62 70 73   Resp: 16     Temp: 97.8 F (36.6 C) 98.3 F (36.8 C)    TempSrc: Oral Axillary    SpO2: 97% 99%    Weight:      Height:        Intake/Output Summary (Last 24 hours) at 07/31/2024 1058 Last data filed at 07/31/2024 1053 Gross per 24 hour  Intake --  Output 250 ml  Net -250 ml      07/28/2024    9:22 PM 07/21/2024    3:01 PM 06/29/2024   12:12 PM  Last 3 Weights  Weight (lbs) 180 lb 186 lb 180 lb  Weight (kg) 81.647 kg 84.369 kg 81.647 kg      Telemetry Normal sinus rhythm with rates in the 60s to 70s. - Personally Reviewed  ECG  No new ECG tracing today. - Personally Reviewed  Physical Exam  GEN: No acute distress.  On 2.5L of O2 via nasal cannula. Neck: No JVD. Cardiac: RRR. No murmurs, rubs, or gallops.  Respiratory: No increased work of breathing. Decreased breath sounds in bilateral bases. No significant wheezes, rhonchi, or rales appreciated. MS: No lower extremity edema. No deformity. Neuro:  No focal deficits. Psych: Normal affect. Responds  appropriately.  Labs High Sensitivity Troponin:  No results for input(s): TROPONINIHS in the last 720 hours.  Recent Labs  Lab 07/28/24 2117 07/29/24 0003  TRNPT 45* 44*       Chemistry Recent Labs  Lab 07/28/24 2117 07/29/24 0245 07/30/24 0420 07/31/24 0411  NA 141 140 139 136  K 3.3* 3.7 4.0 4.4  CL 102 105 103 103  CO2 24 25 28 28   GLUCOSE 118* 114* 100* 96  BUN 26* 24* 31* 36*  CREATININE 1.05 1.01 1.43* 1.16  CALCIUM  9.1 8.7* 8.6* 8.5*  PROT 7.2 6.2*  --   --   ALBUMIN 3.9 3.4*  --   --   AST 23 16  --   --   ALT 11 8  --   --   ALKPHOS 60 51  --   --   BILITOT 0.8 0.6  --   --   GFRNONAA >60 >60 45* 58*  ANIONGAP 15 11 8 6     Lipids  Recent Labs  Lab 07/28/24 2117  CHOL 105  TRIG 68  HDL 51  LDLCALC 40  CHOLHDL 2.1  Hematology Recent Labs  Lab 07/29/24 0245 07/30/24 0420 07/31/24 0411  WBC 7.1 6.3 6.5  RBC 3.10* 3.02* 3.04*  HGB 10.4* 10.0* 10.0*  HCT 30.8* 30.4* 30.9*  MCV 99.4 100.7* 101.6*  MCH 33.5 33.1 32.9  MCHC 33.8 32.9 32.4  RDW 13.6 13.9 13.7  PLT 206 201 189   Thyroid  No results for input(s): TSH, FREET4 in the last 168 hours.  BNP Recent Labs  Lab 07/28/24 2117  PROBNP 1,226.0*    DDimer No results for input(s): DDIMER in the last 168 hours.   Radiology  No results found.   Cardiac Studies  Left Cardiac Catheterization 06/30/2024: Hemodynamic data: EDP 10 mmHg.  No pressure gradient across aortic valve.   Angiographic data: LM: Very large caliber vessel, distal left main has 10 to 20% stenosis. LCx: Very large caliber vessel giving origin to a large OM1, moderate to large AV groove circumflex and there is a 40 to 50% bifurcation stenosis in both branches.  Otherwise vessels are large and smooth. LAD: Large-caliber vessel, severely calcified in the proximal segment with irregular lumen and a 70% stenosis.  Gives origin to large D1 which is ulcerated with ostial and proximal tandem 80 to 95% stenosis.  Mid to  distal LAD has mild disease. RCA: Dominant vessel and a large vessel and occluded in the proximal segment, CTO.  Contralateral collaterals from the LAD.      Impression and recommendations: Complex LAD D1 bifurcation disease, suspect his marked EKG abnormality in the high lateral leads is related to diagonal disease, due to complexity of the disease and his advanced age, would recommend continued medical therapy.  He did develop right forearm hematoma while trying to place TR band due to elevated blood pressure and large wrist, hence blood pressure cuff had to be applied.-Will be restarted 8 hours post TR band removal.   Patient Profile   89 y.o. male with a history of CAD with NSTEMI in 05/2024 (treated medically), carotid/ artery disease, CVA, hypertension, hyperlipidemia, type 2 diabetes melitis, prior DVT,  prior GI bleeding, and obstructive sleep apnea on CPAP.  He was admitted on 07/28/2024 after presenting with chest pain and shortness of breath.  Assessment & Plan   Chest Pain CAD Patient was recently admitted for an NSTEMI at the end of 05/2024.  LHC at that time showed complex LAD/D1 bifurcation disease.  Medical therapy was recommended given complexity of disease and advanced age.  He presented back to the ED on 1/28 for chest pain and shortness of breath.  Initial EKG showed normal sinus rhythm with RBBB and ST changes that were concerning for inferior STEMI.  However, STEMI doc reviewed EKG and felt like he did not meet criteria.  High sensitive troponin minimally elevated and flat at 45 >> 46. - He denies any recurrent chest pain and states his breathing is at baseline. - Continue Lopressor  12.5mg  twice daily.  - Completed 48 hours of heparin  drip, now discontinued - Continue DAPT with Aspirin  and Plavix .  - Continue Lipitor 80mg  daily.  - Update echocardiogram  Carotid Artery Disease Head/ neck CTA in 05/2024 showed severe (at least 85%) at the origin of the right cervical ICA  and approximately 55% stenosis at the left carotid bifurcation/ proximal left cervical ICA. - Continue DAPT and statin. - He has been seen by Vascular Surgery and plan is for medical management.  Hypertension BP well controlled over the last 24 hours. - Continue Lopressor  12.5mg  twice daily. - Continue Hydralazine   50mg  twice daily. - Lisinopril  on hold today given Aki.  Hyperlipidemia Lipid panel this admission: Total Cholesterol 105, Triglycerides 68, HDL 51, LDL 40.  - Continue Lipitor 80mg  daily.  AKI Creatinine up to 1.43 1/30, given IV fluids and improved to normal today - Continue to monitor.   Otherwise, per primary team: - Lactic acidosis - Type 2 diabetes mellitus - Chronic anemia - Restless leg syndrome - Cough - Obstructive sleep apnea  For questions or updates, please contact [facility] Please consult [email or web address] for contact info under     Signed, Lonni LITTIE Nanas, MD  07/31/2024, 10:58 AM      "

## 2024-07-31 NOTE — Plan of Care (Signed)

## 2024-08-01 ENCOUNTER — Other Ambulatory Visit (HOSPITAL_COMMUNITY): Payer: Self-pay

## 2024-08-01 DIAGNOSIS — N179 Acute kidney failure, unspecified: Secondary | ICD-10-CM | POA: Diagnosis not present

## 2024-08-01 DIAGNOSIS — I5023 Acute on chronic systolic (congestive) heart failure: Secondary | ICD-10-CM | POA: Diagnosis not present

## 2024-08-01 DIAGNOSIS — I214 Non-ST elevation (NSTEMI) myocardial infarction: Secondary | ICD-10-CM | POA: Diagnosis not present

## 2024-08-01 LAB — CBC
HCT: 30.7 % — ABNORMAL LOW (ref 39.0–52.0)
Hemoglobin: 10 g/dL — ABNORMAL LOW (ref 13.0–17.0)
MCH: 33.2 pg (ref 26.0–34.0)
MCHC: 32.6 g/dL (ref 30.0–36.0)
MCV: 102 fL — ABNORMAL HIGH (ref 80.0–100.0)
Platelets: 202 10*3/uL (ref 150–400)
RBC: 3.01 MIL/uL — ABNORMAL LOW (ref 4.22–5.81)
RDW: 13.4 % (ref 11.5–15.5)
WBC: 8.5 10*3/uL (ref 4.0–10.5)
nRBC: 0 % (ref 0.0–0.2)

## 2024-08-01 LAB — BASIC METABOLIC PANEL WITH GFR
Anion gap: 8 (ref 5–15)
BUN: 37 mg/dL — ABNORMAL HIGH (ref 8–23)
CO2: 26 mmol/L (ref 22–32)
Calcium: 8.7 mg/dL — ABNORMAL LOW (ref 8.9–10.3)
Chloride: 103 mmol/L (ref 98–111)
Creatinine, Ser: 1.06 mg/dL (ref 0.61–1.24)
GFR, Estimated: >60 mL/min
Glucose, Bld: 134 mg/dL — ABNORMAL HIGH (ref 70–99)
Potassium: 4.6 mmol/L (ref 3.5–5.1)
Sodium: 137 mmol/L (ref 135–145)

## 2024-08-01 MED ORDER — METOPROLOL SUCCINATE ER 25 MG PO TB24
25.0000 mg | ORAL_TABLET | Freq: Every day | ORAL | 1 refills | Status: AC
  Filled 2024-08-01: qty 90, 90d supply, fill #0

## 2024-08-01 MED ORDER — MELATONIN 3 MG PO TABS
3.0000 mg | ORAL_TABLET | Freq: Every evening | ORAL | Status: DC | PRN
  Filled 2024-08-01: qty 1

## 2024-08-01 MED ORDER — METOPROLOL SUCCINATE ER 25 MG PO TB24: 25.0000 mg | ORAL_TABLET | Freq: Every day | ORAL | Status: DC

## 2024-08-01 NOTE — TOC Transition Note (Addendum)
 Transition of Care [facility]) - Discharge Note   Patient Details  Name: Brandon Mason MRN: [id number] Date of Birth: [birth year]  Transition of Care [facility]) CM/SW Contact:  Robynn Eileen Hoose, RN Phone Number: 08/01/2024, 11:51 AM   Clinical Narrative:   Patient is being discharged. Spoke with daughter, confirmed address, home oxygen  available and that patient would need PTAR transportation home. PTAR forms completed and sent to unit with facesheet. PTAR transportation arranged.          Patient Goals and CMS Choice            Discharge Placement                       Discharge Plan and Services Additional resources added to the After Visit Summary for                                       Social Drivers of Health (SDOH) Interventions SDOH Screenings   Food Insecurity: No Food Insecurity (07/29/2024)  Housing: Low Risk (07/29/2024)  Transportation Needs: No Transportation Needs (07/29/2024)  Utilities: Not At Risk (07/29/2024)  Financial Resource Strain: Low Risk (04/13/2024)   Received from [facility]  Physical Activity: Inactive (04/13/2024)   Received from [facility]  Social Connections: Moderately Integrated (07/29/2024)  Stress: No Stress Concern Present (04/13/2024)   Received from [facility]  Tobacco Use: Low Risk (07/29/2024)     Readmission Risk Interventions     No data to display

## 2024-08-01 NOTE — Plan of Care (Signed)

## 2024-08-01 NOTE — Progress Notes (Signed)
 "  Rounding Note   Patient Name: Brandon Mason Date of Encounter: 08/01/2024  [facility] Cardiologist: Jerel Balding, MD   Subjective BP 123/58, creatinine 1.06.  Denies any chest pain or dyspnea  Scheduled Meds:  aspirin  EC  81 mg Oral Daily   atorvastatin   80 mg Oral Daily   clopidogrel   75 mg Oral Daily   finasteride   5 mg Oral Daily   hydrALAZINE   50 mg Oral BID   levothyroxine   25 mcg Oral Daily   lisinopril   20 mg Oral Daily   metoprolol  tartrate  12.5 mg Oral BID   pantoprazole   40 mg Oral Q0600   pregabalin   150 mg Oral BID   rOPINIRole   2 mg Oral BID   rOPINIRole   3 mg Oral QHS   traMADol   50 mg Oral QID   Continuous Infusions:   PRN Meds: melatonin, nitroGLYCERIN , ondansetron  (ZOFRAN ) IV, polyethylene glycol   Vital Signs  Vitals:   07/31/24 2223 08/01/24 0023 08/01/24 0327 08/01/24 0732  BP: (!) 167/77 123/69  (!) 123/58  Pulse: 69 69  79  Resp:  18  18  Temp:  98.3 F (36.8 C) 98 F (36.7 C) 97.7 F (36.5 C)  TempSrc:  Oral Oral Oral  SpO2:  95%  96%  Weight:      Height:        Intake/Output Summary (Last 24 hours) at 08/01/2024 1120 Last data filed at 08/01/2024 1028 Gross per 24 hour  Intake 580 ml  Output 3050 ml  Net -2470 ml      07/28/2024    9:22 PM 07/21/2024    3:01 PM 06/29/2024   12:12 PM  Last 3 Weights  Weight (lbs) 180 lb 186 lb 180 lb  Weight (kg) 81.647 kg 84.369 kg 81.647 kg      Telemetry Normal sinus rhythm with rates in the 60s to 70s. - Personally Reviewed  ECG  No new ECG tracing today. - Personally Reviewed  Physical Exam  GEN: No acute distress.  On 2.5L of O2 via nasal cannula. Neck: No JVD. Cardiac: RRR. No murmurs, rubs, or gallops.  Respiratory: No increased work of breathing. Decreased breath sounds in bilateral bases. No significant wheezes, rhonchi, or rales appreciated. MS: No lower extremity edema. No deformity. Neuro:  No focal deficits. Psych: Normal affect. Responds  appropriately.  Labs High Sensitivity Troponin:  No results for input(s): TROPONINIHS in the last 720 hours.  Recent Labs  Lab 07/28/24 2117 07/29/24 0003  TRNPT 45* 44*       Chemistry Recent Labs  Lab 07/28/24 2117 07/29/24 0245 07/30/24 0420 07/31/24 0411 08/01/24 0655  NA 141 140 139 136 137  K 3.3* 3.7 4.0 4.4 4.6  CL 102 105 103 103 103  CO2 24 25 28 28 26   GLUCOSE 118* 114* 100* 96 134*  BUN 26* 24* 31* 36* 37*  CREATININE 1.05 1.01 1.43* 1.16 1.06  CALCIUM  9.1 8.7* 8.6* 8.5* 8.7*  PROT 7.2 6.2*  --   --   --   ALBUMIN 3.9 3.4*  --   --   --   AST 23 16  --   --   --   ALT 11 8  --   --   --   ALKPHOS 60 51  --   --   --   BILITOT 0.8 0.6  --   --   --   GFRNONAA >60 >60 45* 58* >60  ANIONGAP 15  11 8 6 8     Lipids  Recent Labs  Lab 07/28/24 2117  CHOL 105  TRIG 68  HDL 51  LDLCALC 40  CHOLHDL 2.1    Hematology Recent Labs  Lab 07/30/24 0420 07/31/24 0411 08/01/24 0655  WBC 6.3 6.5 8.5  RBC 3.02* 3.04* 3.01*  HGB 10.0* 10.0* 10.0*  HCT 30.4* 30.9* 30.7*  MCV 100.7* 101.6* 102.0*  MCH 33.1 32.9 33.2  MCHC 32.9 32.4 32.6  RDW 13.9 13.7 13.4  PLT 201 189 202   Thyroid  No results for input(s): TSH, FREET4 in the last 168 hours.  BNP Recent Labs  Lab 07/28/24 2117  PROBNP 1,226.0*    DDimer No results for input(s): DDIMER in the last 168 hours.   Radiology  ECHOCARDIOGRAM COMPLETE Result Date: 07/31/2024    ECHOCARDIOGRAM REPORT   Patient Name:   Brandon Mason Date of Exam: 07/31/2024 Medical Rec #:  [id number]       Height:       68.0 in Accession #:    [id number]      Weight:       180.0 lb Date of Birth:  02/25/1926       BSA:          1.954 m Patient Age:    89 years        BP:           110/52 mmHg Patient Gender: M               HR:           62 bpm. Exam Location:  Inpatient Procedure: 2D Echo, Cardiac Doppler and Color Doppler (Both Spectral and Color            Flow Doppler were utilized during procedure). Indications:    Chest  Pain  History:        Patient has prior history of Echocardiogram examinations, most                 recent 03/18/2022. Cardiomegaly, Stroke; Risk                 Factors:Hypertension and Sleep Apnea.  Sonographer:    Jayson Gaskins Referring Phys: [id number] Analiz Tvedt L Nickisha Hum IMPRESSIONS  1. Left ventricular ejection fraction, by estimation, is 30 to 35%. The left ventricle has moderately decreased function. The left ventricle demonstrates global hypokinesis. There is mild concentric left ventricular hypertrophy. Left ventricular diastolic parameters are consistent with Grade I diastolic dysfunction (impaired relaxation).  2. Right ventricular systolic function is mildly reduced. The right ventricular size is normal. There is severely elevated pulmonary artery systolic pressure.  3. The mitral valve is grossly normal. Trivial mitral valve regurgitation.  4. The aortic valve is calcified. Aortic valve regurgitation is trivial. Mild aortic valve stenosis. FINDINGS  Left Ventricle: Left ventricular ejection fraction, by estimation, is 30 to 35%. The left ventricle has moderately decreased function. The left ventricle demonstrates global hypokinesis. The left ventricular internal cavity size was normal in size. There is mild concentric left ventricular hypertrophy. Left ventricular diastolic parameters are consistent with Grade I diastolic dysfunction (impaired relaxation). Right Ventricle: The right ventricular size is normal. No increase in right ventricular wall thickness. Right ventricular systolic function is mildly reduced. There is severely elevated pulmonary artery systolic pressure. Left Atrium: Left atrial size was normal in size. Right Atrium: Right atrial size was normal in size. Pericardium: Trivial pericardial effusion is present. The pericardial effusion is circumferential. Mitral  Valve: The mitral valve is grossly normal. Trivial mitral valve regurgitation. Tricuspid Valve: The tricuspid valve is grossly  normal. Tricuspid valve regurgitation is trivial. Aortic Valve: The aortic valve is calcified. Aortic valve regurgitation is trivial. Mild aortic stenosis is present. Aortic valve mean gradient measures 7.0 mmHg. Aortic valve peak gradient measures 11.7 mmHg. Aortic valve area, by VTI measures 1.66 cm. Pulmonic Valve: The pulmonic valve was not well visualized. Pulmonic valve regurgitation is trivial. Aorta: The aortic root and ascending aorta are structurally normal, with no evidence of dilitation. IAS/Shunts: No atrial level shunt detected by color flow Doppler.  LEFT VENTRICLE PLAX 2D LVIDd:         4.70 cm   Diastology LVIDs:         3.60 cm   LV e' medial:    4.57 cm/s LV PW:         1.20 cm   LV E/e' medial:  18.6 LV IVS:        1.00 cm   LV e' lateral:   9.25 cm/s LVOT diam:     1.90 cm   LV E/e' lateral: 9.2 LV SV:         63 LV SV Index:   32 LVOT Area:     2.84 cm  RIGHT VENTRICLE RV S prime:     10.70 cm/s TAPSE (M-mode): 2.1 cm LEFT ATRIUM             Index        RIGHT ATRIUM           Index LA Vol (A2C):   56.5 ml 28.91 ml/m  RA Area:     12.30 cm LA Vol (A4C):   57.6 ml 29.47 ml/m  RA Volume:   24.60 ml  12.59 ml/m LA Biplane Vol: 57.2 ml 29.27 ml/m  AORTIC VALVE AV Area (Vmax):    1.63 cm AV Area (Vmean):   1.78 cm AV Area (VTI):     1.66 cm AV Vmax:           171.00 cm/s AV Vmean:          127.000 cm/s AV VTI:            0.378 m AV Peak Grad:      11.7 mmHg AV Mean Grad:      7.0 mmHg LVOT Vmax:         98.20 cm/s LVOT Vmean:        79.700 cm/s LVOT VTI:          0.221 m LVOT/AV VTI ratio: 0.58  AORTA Ao Root diam: 3.30 cm MITRAL VALVE MV Area (PHT): 2.29 cm     SHUNTS MV Decel Time: 331 msec     Systemic VTI:  0.22 m MV E velocity: 84.90 cm/s   Systemic Diam: 1.90 cm MV A velocity: 125.00 cm/s MV E/A ratio:  0.68 Kardie Tobb DO Electronically signed by Dub Huntsman DO Signature Date/Time: 07/31/2024/2:20:57 PM    Final      Cardiac Studies  Left Cardiac Catheterization  06/30/2024: Hemodynamic data: EDP 10 mmHg.  No pressure gradient across aortic valve.   Angiographic data: LM: Very large caliber vessel, distal left main has 10 to 20% stenosis. LCx: Very large caliber vessel giving origin to a large OM1, moderate to large AV groove circumflex and there is a 40 to 50% bifurcation stenosis in both branches.  Otherwise vessels are large and smooth. LAD: Large-caliber vessel, severely calcified in  the proximal segment with irregular lumen and a 70% stenosis.  Gives origin to large D1 which is ulcerated with ostial and proximal tandem 80 to 95% stenosis.  Mid to distal LAD has mild disease. RCA: Dominant vessel and a large vessel and occluded in the proximal segment, CTO.  Contralateral collaterals from the LAD.      Impression and recommendations: Complex LAD D1 bifurcation disease, suspect his marked EKG abnormality in the high lateral leads is related to diagonal disease, due to complexity of the disease and his advanced age, would recommend continued medical therapy.  He did develop right forearm hematoma while trying to place TR band due to elevated blood pressure and large wrist, hence blood pressure cuff had to be applied.-Will be restarted 8 hours post TR band removal.   Patient Profile   89 y.o. male with a history of CAD with NSTEMI in 05/2024 (treated medically), carotid/ artery disease, CVA, hypertension, hyperlipidemia, type 2 diabetes melitis, prior DVT,  prior GI bleeding, and obstructive sleep apnea on CPAP.  He was admitted on 07/28/2024 after presenting with chest pain and shortness of breath.  Assessment & Plan   Chest Pain CAD Patient was recently admitted for an NSTEMI at the end of 05/2024.  LHC at that time showed complex LAD/D1 bifurcation disease.  Medical therapy was recommended given complexity of disease and advanced age.  He presented back to the ED on 1/28 for chest pain and shortness of breath.  Initial EKG showed normal sinus rhythm  with RBBB and ST changes that were concerning for inferior STEMI.  However, STEMI doc reviewed EKG and felt like he did not meet criteria.  High sensitive troponin minimally elevated and flat at 45 >> 46. - He denies any recurrent chest pain and states his breathing is at baseline. - Continue metoprolol , will consolidate to Toprol -XL - Completed 48 hours of heparin  drip, now discontinued - Continue DAPT with Aspirin  and Plavix .  - Continue Lipitor 80mg  daily.   Acute on chronic combined heart failure Echocardiogram with EF 30 to 35%, mildly reduced RV function, mild aortic stenosis. - Appears euvolemic - Continue metoprolol , will consolidate to Toprol -XL - Continue lisinopril  20 mg daily - Continue hydralazine  50 mg twice daily.  Carotid Artery Disease Head/ neck CTA in 05/2024 showed severe (at least 85%) at the origin of the right cervical ICA and approximately 55% stenosis at the left carotid bifurcation/ proximal left cervical ICA. - Continue DAPT and statin. - He has been seen by Vascular Surgery and plan is for medical management.  Hypertension BP well controlled over the last 24 hours. - Continue Lopressor  12.5mg  twice daily. - Continue Hydralazine  50mg  twice daily. - Continue lisinopril  20 mg daily  Hyperlipidemia Lipid panel this admission: Total Cholesterol 105, Triglycerides 68, HDL 51, LDL 40.  - Continue Lipitor 80mg  daily.  AKI Creatinine up to 1.43 1/30, given IV fluids and improved to normal, creatinine 1.06 today - Continue to monitor.   Otherwise, per primary team: - Lactic acidosis - Type 2 diabetes mellitus - Chronic anemia - Restless leg syndrome - Cough - Obstructive sleep apnea  [facility] will sign off.   Medication Recommendations: Aspirin  81 mg daily, Plavix  75 mg daily, atorvastatin  80 mg daily, Toprol -XL 25 mg daily, hydralazine  50 mg twice daily, lisinopril  20 mg daily Other recommendations (labs, testing, etc): None Follow up as an  outpatient: Appointment 2/23   For questions or updates, please contact [facility] Please consult [email or web address] for  contact info under     Signed, Lonni LITTIE Nanas, MD  08/01/2024, 11:20 AM      "

## 2024-08-01 NOTE — Discharge Instructions (Signed)
 Brandon Mason

## 2024-08-01 NOTE — Progress Notes (Signed)
 Patient ambulated approximately 480 feet in the hall with a walker with standby assist. Patient saturation at rest on 2L nasal cannula is 96%. While ambulating patient requires his home dose of 3L [place] with a saturation of 93%. Patient denies chest pain, shortness of breath and dizziness while ambulating. Patient is in room bathing.

## 2024-08-01 NOTE — Progress Notes (Signed)
 DISCHARGE NOTE [name] to be discharged Home per MD order. Patient verbalized understanding. Reviewed AVS with patient Delivereed TOC meds to room.  All questions answered.  Skin clean, dry and intact without evidence of skin break down, no evidence of skin tears noted. IV catheter discontinued intact. Site without signs and symptoms of complications. Dressing and pressure applied. Pt denies pain at the site currently. No complaints noted.  Patient free of lines, drains, and wounds.   Discharge packet assembled. An After Visit Summary (AVS) was printed and given to the EMS personnel. Patient escorted via stretcher and discharged to [facility] via ambulance. Report called to accepting facility; all questions and concerns addressed.   Peyton SHAUNNA Pepper, RN

## 2024-08-02 ENCOUNTER — Encounter: Payer: Self-pay | Admitting: Nurse Practitioner

## 2024-08-02 LAB — CULTURE, BLOOD (ROUTINE X 2)
Culture: NO GROWTH
Special Requests: ADEQUATE

## 2024-08-04 LAB — CULTURE, BLOOD (ROUTINE X 2)
Culture  Setup Time: NO GROWTH
Special Requests: ADEQUATE

## 2024-08-05 ENCOUNTER — Ambulatory Visit (HOSPITAL_COMMUNITY)

## 2024-08-05 ENCOUNTER — Ambulatory Visit

## 2024-08-06 ENCOUNTER — Other Ambulatory Visit: Payer: Self-pay

## 2024-08-06 DIAGNOSIS — I6529 Occlusion and stenosis of unspecified carotid artery: Secondary | ICD-10-CM

## 2024-08-23 ENCOUNTER — Ambulatory Visit: Admitting: Physician Assistant

## 2024-09-02 ENCOUNTER — Ambulatory Visit (HOSPITAL_COMMUNITY)

## 2024-09-02 ENCOUNTER — Ambulatory Visit: Admitting: Vascular Surgery

## 2024-11-17 ENCOUNTER — Ambulatory Visit: Admitting: Cardiovascular Disease
